# Patient Record
Sex: Female | Born: 1975 | Race: Black or African American | Hispanic: No | Marital: Single
Health system: Southern US, Community
[De-identification: ages and names within clinical notes are randomized; demographics above are authoritative.]

## PROBLEM LIST (undated history)

## (undated) ENCOUNTER — Inpatient Hospital Stay (HOSPITAL_COMMUNITY): Payer: Self-pay

## (undated) DIAGNOSIS — J189 Pneumonia, unspecified organism: Secondary | ICD-10-CM

## (undated) DIAGNOSIS — T7840XA Allergy, unspecified, initial encounter: Secondary | ICD-10-CM

## (undated) DIAGNOSIS — R51 Headache: Secondary | ICD-10-CM

## (undated) DIAGNOSIS — E049 Nontoxic goiter, unspecified: Secondary | ICD-10-CM

## (undated) DIAGNOSIS — R519 Headache, unspecified: Secondary | ICD-10-CM

## (undated) DIAGNOSIS — R87629 Unspecified abnormal cytological findings in specimens from vagina: Secondary | ICD-10-CM

## (undated) DIAGNOSIS — E739 Lactose intolerance, unspecified: Secondary | ICD-10-CM

## (undated) DIAGNOSIS — O09291 Supervision of pregnancy with other poor reproductive or obstetric history, first trimester: Secondary | ICD-10-CM

## (undated) DIAGNOSIS — G43909 Migraine, unspecified, not intractable, without status migrainosus: Secondary | ICD-10-CM

## (undated) HISTORY — DX: Migraine, unspecified, not intractable, without status migrainosus: G43.909

## (undated) HISTORY — DX: Lactose intolerance, unspecified: E73.9

## (undated) HISTORY — DX: Allergy, unspecified, initial encounter: T78.40XA

## (undated) HISTORY — PX: FOOT SURGERY: SHX648

## (undated) HISTORY — PX: DILATION AND CURETTAGE OF UTERUS: SHX78

---

## 1997-05-10 ENCOUNTER — Emergency Department (HOSPITAL_COMMUNITY): Admission: EM | Admit: 1997-05-10 | Discharge: 1997-05-10 | Payer: Self-pay | Admitting: Emergency Medicine

## 1997-05-16 ENCOUNTER — Emergency Department (HOSPITAL_COMMUNITY): Admission: EM | Admit: 1997-05-16 | Discharge: 1997-05-16 | Payer: Self-pay | Admitting: Emergency Medicine

## 1997-07-06 ENCOUNTER — Emergency Department (HOSPITAL_COMMUNITY): Admission: EM | Admit: 1997-07-06 | Discharge: 1997-07-06 | Payer: Self-pay | Admitting: Emergency Medicine

## 1997-09-15 ENCOUNTER — Other Ambulatory Visit: Admission: RE | Admit: 1997-09-15 | Discharge: 1997-09-15 | Payer: Self-pay | Admitting: Obstetrics

## 1997-09-28 ENCOUNTER — Emergency Department (HOSPITAL_COMMUNITY): Admission: EM | Admit: 1997-09-28 | Discharge: 1997-09-28 | Payer: Self-pay | Admitting: Emergency Medicine

## 1997-12-01 ENCOUNTER — Other Ambulatory Visit: Admission: RE | Admit: 1997-12-01 | Discharge: 1997-12-01 | Payer: Self-pay | Admitting: Obstetrics

## 1998-01-25 ENCOUNTER — Emergency Department (HOSPITAL_COMMUNITY): Admission: EM | Admit: 1998-01-25 | Discharge: 1998-01-25 | Payer: Self-pay | Admitting: Emergency Medicine

## 1998-01-28 ENCOUNTER — Ambulatory Visit (HOSPITAL_COMMUNITY): Admission: RE | Admit: 1998-01-28 | Discharge: 1998-01-28 | Payer: Self-pay | Admitting: *Deleted

## 1998-01-28 ENCOUNTER — Encounter: Payer: Self-pay | Admitting: *Deleted

## 1998-07-17 ENCOUNTER — Emergency Department (HOSPITAL_COMMUNITY): Admission: EM | Admit: 1998-07-17 | Discharge: 1998-07-17 | Payer: Self-pay | Admitting: Emergency Medicine

## 1999-02-26 ENCOUNTER — Inpatient Hospital Stay (HOSPITAL_COMMUNITY): Admission: AD | Admit: 1999-02-26 | Discharge: 1999-02-26 | Payer: Self-pay | Admitting: Obstetrics

## 1999-03-08 ENCOUNTER — Emergency Department (HOSPITAL_COMMUNITY): Admission: EM | Admit: 1999-03-08 | Discharge: 1999-03-08 | Payer: Self-pay | Admitting: Emergency Medicine

## 1999-08-07 ENCOUNTER — Emergency Department (HOSPITAL_COMMUNITY): Admission: EM | Admit: 1999-08-07 | Discharge: 1999-08-07 | Payer: Self-pay | Admitting: Emergency Medicine

## 1999-11-17 ENCOUNTER — Emergency Department (HOSPITAL_COMMUNITY): Admission: EM | Admit: 1999-11-17 | Discharge: 1999-11-17 | Payer: Self-pay | Admitting: Emergency Medicine

## 1999-11-17 ENCOUNTER — Encounter: Payer: Self-pay | Admitting: Emergency Medicine

## 2000-03-03 ENCOUNTER — Emergency Department (HOSPITAL_COMMUNITY): Admission: EM | Admit: 2000-03-03 | Discharge: 2000-03-03 | Payer: Self-pay | Admitting: Emergency Medicine

## 2000-03-31 ENCOUNTER — Emergency Department (HOSPITAL_COMMUNITY): Admission: EM | Admit: 2000-03-31 | Discharge: 2000-03-31 | Payer: Self-pay | Admitting: Emergency Medicine

## 2000-04-11 ENCOUNTER — Encounter: Admission: RE | Admit: 2000-04-11 | Discharge: 2000-04-11 | Payer: Self-pay | Admitting: Hematology and Oncology

## 2000-05-23 ENCOUNTER — Encounter: Admission: RE | Admit: 2000-05-23 | Discharge: 2000-05-23 | Payer: Self-pay | Admitting: Obstetrics & Gynecology

## 2000-06-13 ENCOUNTER — Encounter: Payer: Self-pay | Admitting: Emergency Medicine

## 2000-06-13 ENCOUNTER — Emergency Department (HOSPITAL_COMMUNITY): Admission: EM | Admit: 2000-06-13 | Discharge: 2000-06-13 | Payer: Self-pay

## 2000-07-25 ENCOUNTER — Encounter: Admission: RE | Admit: 2000-07-25 | Discharge: 2000-07-25 | Payer: Self-pay | Admitting: Obstetrics & Gynecology

## 2000-09-24 ENCOUNTER — Emergency Department (HOSPITAL_COMMUNITY): Admission: EM | Admit: 2000-09-24 | Discharge: 2000-09-24 | Payer: Self-pay | Admitting: Emergency Medicine

## 2000-09-28 ENCOUNTER — Encounter: Admission: RE | Admit: 2000-09-28 | Discharge: 2000-09-28 | Payer: Self-pay | Admitting: Obstetrics

## 2001-03-22 ENCOUNTER — Encounter: Admission: RE | Admit: 2001-03-22 | Discharge: 2001-03-22 | Payer: Self-pay | Admitting: *Deleted

## 2001-05-10 ENCOUNTER — Encounter: Admission: RE | Admit: 2001-05-10 | Discharge: 2001-05-10 | Payer: Self-pay | Admitting: *Deleted

## 2001-06-22 ENCOUNTER — Encounter: Admission: RE | Admit: 2001-06-22 | Discharge: 2001-06-22 | Payer: Self-pay | Admitting: *Deleted

## 2001-08-02 ENCOUNTER — Encounter: Admission: RE | Admit: 2001-08-02 | Discharge: 2001-08-02 | Payer: Self-pay | Admitting: *Deleted

## 2001-10-04 ENCOUNTER — Encounter: Admission: RE | Admit: 2001-10-04 | Discharge: 2001-10-04 | Payer: Self-pay | Admitting: Obstetrics and Gynecology

## 2001-11-02 ENCOUNTER — Encounter: Admission: RE | Admit: 2001-11-02 | Discharge: 2001-11-02 | Payer: Self-pay | Admitting: *Deleted

## 2001-11-13 ENCOUNTER — Encounter: Admission: RE | Admit: 2001-11-13 | Discharge: 2001-11-13 | Payer: Self-pay | Admitting: *Deleted

## 2002-01-10 ENCOUNTER — Encounter: Admission: RE | Admit: 2002-01-10 | Discharge: 2002-01-10 | Payer: Self-pay | Admitting: *Deleted

## 2002-05-30 ENCOUNTER — Encounter: Admission: RE | Admit: 2002-05-30 | Discharge: 2002-05-30 | Payer: Self-pay | Admitting: Obstetrics and Gynecology

## 2002-08-19 ENCOUNTER — Inpatient Hospital Stay (HOSPITAL_COMMUNITY): Admission: AD | Admit: 2002-08-19 | Discharge: 2002-08-19 | Payer: Self-pay | Admitting: Obstetrics and Gynecology

## 2002-09-18 ENCOUNTER — Inpatient Hospital Stay (HOSPITAL_COMMUNITY): Admission: AD | Admit: 2002-09-18 | Discharge: 2002-09-18 | Payer: Self-pay | Admitting: Obstetrics and Gynecology

## 2002-11-14 ENCOUNTER — Inpatient Hospital Stay (HOSPITAL_COMMUNITY): Admission: AD | Admit: 2002-11-14 | Discharge: 2002-11-14 | Payer: Self-pay | Admitting: Obstetrics & Gynecology

## 2003-06-27 ENCOUNTER — Emergency Department (HOSPITAL_COMMUNITY): Admission: EM | Admit: 2003-06-27 | Discharge: 2003-06-28 | Payer: Self-pay | Admitting: Emergency Medicine

## 2003-09-21 ENCOUNTER — Emergency Department (HOSPITAL_COMMUNITY): Admission: EM | Admit: 2003-09-21 | Discharge: 2003-09-21 | Payer: Self-pay | Admitting: Internal Medicine

## 2003-10-06 ENCOUNTER — Inpatient Hospital Stay (HOSPITAL_COMMUNITY): Admission: AD | Admit: 2003-10-06 | Discharge: 2003-10-06 | Payer: Self-pay | Admitting: *Deleted

## 2003-11-27 ENCOUNTER — Emergency Department (HOSPITAL_COMMUNITY): Admission: EM | Admit: 2003-11-27 | Discharge: 2003-11-27 | Payer: Self-pay | Admitting: Family Medicine

## 2004-02-25 ENCOUNTER — Inpatient Hospital Stay (HOSPITAL_COMMUNITY): Admission: AD | Admit: 2004-02-25 | Discharge: 2004-02-25 | Payer: Self-pay | Admitting: *Deleted

## 2004-02-27 ENCOUNTER — Ambulatory Visit: Payer: Self-pay | Admitting: Obstetrics and Gynecology

## 2004-02-27 ENCOUNTER — Ambulatory Visit (HOSPITAL_COMMUNITY): Admission: RE | Admit: 2004-02-27 | Discharge: 2004-02-27 | Payer: Self-pay | Admitting: Obstetrics and Gynecology

## 2004-02-27 ENCOUNTER — Encounter (INDEPENDENT_AMBULATORY_CARE_PROVIDER_SITE_OTHER): Payer: Self-pay | Admitting: Specialist

## 2004-03-11 ENCOUNTER — Ambulatory Visit: Payer: Self-pay | Admitting: Obstetrics and Gynecology

## 2004-04-22 ENCOUNTER — Ambulatory Visit: Payer: Self-pay | Admitting: Internal Medicine

## 2004-04-30 ENCOUNTER — Ambulatory Visit: Payer: Self-pay | Admitting: Internal Medicine

## 2004-05-11 ENCOUNTER — Emergency Department (HOSPITAL_COMMUNITY): Admission: EM | Admit: 2004-05-11 | Discharge: 2004-05-11 | Payer: Self-pay | Admitting: Emergency Medicine

## 2004-05-12 ENCOUNTER — Ambulatory Visit (HOSPITAL_COMMUNITY): Admission: RE | Admit: 2004-05-12 | Discharge: 2004-05-12 | Payer: Self-pay | Admitting: Internal Medicine

## 2004-05-12 ENCOUNTER — Emergency Department (HOSPITAL_COMMUNITY): Admission: EM | Admit: 2004-05-12 | Discharge: 2004-05-12 | Payer: Self-pay | Admitting: Emergency Medicine

## 2004-05-22 ENCOUNTER — Emergency Department (HOSPITAL_COMMUNITY): Admission: AD | Admit: 2004-05-22 | Discharge: 2004-05-22 | Payer: Self-pay | Admitting: Family Medicine

## 2004-05-25 ENCOUNTER — Ambulatory Visit: Payer: Self-pay | Admitting: Internal Medicine

## 2004-06-03 ENCOUNTER — Inpatient Hospital Stay (HOSPITAL_COMMUNITY): Admission: AD | Admit: 2004-06-03 | Discharge: 2004-06-03 | Payer: Self-pay | Admitting: Obstetrics & Gynecology

## 2004-06-10 ENCOUNTER — Ambulatory Visit: Payer: Self-pay | Admitting: Endocrinology

## 2004-06-22 ENCOUNTER — Other Ambulatory Visit: Admission: RE | Admit: 2004-06-22 | Discharge: 2004-06-22 | Payer: Self-pay | Admitting: Interventional Radiology

## 2004-06-22 ENCOUNTER — Encounter: Admission: RE | Admit: 2004-06-22 | Discharge: 2004-06-22 | Payer: Self-pay | Admitting: Endocrinology

## 2004-06-22 ENCOUNTER — Encounter (INDEPENDENT_AMBULATORY_CARE_PROVIDER_SITE_OTHER): Payer: Self-pay | Admitting: *Deleted

## 2004-06-29 ENCOUNTER — Inpatient Hospital Stay (HOSPITAL_COMMUNITY): Admission: AD | Admit: 2004-06-29 | Discharge: 2004-06-29 | Payer: Self-pay | Admitting: Obstetrics and Gynecology

## 2004-06-29 ENCOUNTER — Emergency Department (HOSPITAL_COMMUNITY): Admission: EM | Admit: 2004-06-29 | Discharge: 2004-06-29 | Payer: Self-pay | Admitting: Family Medicine

## 2004-07-02 ENCOUNTER — Inpatient Hospital Stay (HOSPITAL_COMMUNITY): Admission: AD | Admit: 2004-07-02 | Discharge: 2004-07-02 | Payer: Self-pay | Admitting: Obstetrics & Gynecology

## 2004-07-09 ENCOUNTER — Inpatient Hospital Stay (HOSPITAL_COMMUNITY): Admission: AD | Admit: 2004-07-09 | Discharge: 2004-07-09 | Payer: Self-pay | Admitting: Obstetrics and Gynecology

## 2004-07-21 ENCOUNTER — Inpatient Hospital Stay (HOSPITAL_COMMUNITY): Admission: AD | Admit: 2004-07-21 | Discharge: 2004-07-21 | Payer: Self-pay | Admitting: Obstetrics

## 2004-08-04 ENCOUNTER — Inpatient Hospital Stay (HOSPITAL_COMMUNITY): Admission: AD | Admit: 2004-08-04 | Discharge: 2004-08-04 | Payer: Self-pay | Admitting: Obstetrics

## 2004-08-05 ENCOUNTER — Emergency Department (HOSPITAL_COMMUNITY): Admission: EM | Admit: 2004-08-05 | Discharge: 2004-08-05 | Payer: Self-pay | Admitting: Emergency Medicine

## 2004-08-27 ENCOUNTER — Inpatient Hospital Stay (HOSPITAL_COMMUNITY): Admission: AD | Admit: 2004-08-27 | Discharge: 2004-08-27 | Payer: Self-pay | Admitting: Obstetrics

## 2004-08-28 ENCOUNTER — Encounter (INDEPENDENT_AMBULATORY_CARE_PROVIDER_SITE_OTHER): Payer: Self-pay | Admitting: Specialist

## 2004-08-28 ENCOUNTER — Ambulatory Visit (HOSPITAL_COMMUNITY): Admission: AD | Admit: 2004-08-28 | Discharge: 2004-08-28 | Payer: Self-pay | Admitting: Obstetrics

## 2004-09-29 ENCOUNTER — Emergency Department (HOSPITAL_COMMUNITY): Admission: EM | Admit: 2004-09-29 | Discharge: 2004-09-29 | Payer: Self-pay | Admitting: Emergency Medicine

## 2004-10-19 ENCOUNTER — Ambulatory Visit: Payer: Self-pay | Admitting: Endocrinology

## 2004-10-24 ENCOUNTER — Emergency Department (HOSPITAL_COMMUNITY): Admission: EM | Admit: 2004-10-24 | Discharge: 2004-10-24 | Payer: Self-pay | Admitting: Family Medicine

## 2004-10-30 ENCOUNTER — Emergency Department (HOSPITAL_COMMUNITY): Admission: EM | Admit: 2004-10-30 | Discharge: 2004-10-30 | Payer: Self-pay | Admitting: Family Medicine

## 2004-12-06 ENCOUNTER — Encounter (INDEPENDENT_AMBULATORY_CARE_PROVIDER_SITE_OTHER): Payer: Self-pay | Admitting: Specialist

## 2004-12-06 ENCOUNTER — Other Ambulatory Visit: Admission: RE | Admit: 2004-12-06 | Discharge: 2004-12-06 | Payer: Self-pay | Admitting: Interventional Radiology

## 2004-12-06 ENCOUNTER — Encounter: Admission: RE | Admit: 2004-12-06 | Discharge: 2004-12-06 | Payer: Self-pay | Admitting: Surgery

## 2005-01-09 ENCOUNTER — Emergency Department (HOSPITAL_COMMUNITY): Admission: AD | Admit: 2005-01-09 | Discharge: 2005-01-09 | Payer: Self-pay | Admitting: Family Medicine

## 2005-01-28 ENCOUNTER — Emergency Department (HOSPITAL_COMMUNITY): Admission: EM | Admit: 2005-01-28 | Discharge: 2005-01-28 | Payer: Self-pay | Admitting: Family Medicine

## 2005-03-23 ENCOUNTER — Emergency Department (HOSPITAL_COMMUNITY): Admission: EM | Admit: 2005-03-23 | Discharge: 2005-03-23 | Payer: Self-pay | Admitting: Family Medicine

## 2005-07-12 ENCOUNTER — Inpatient Hospital Stay (HOSPITAL_COMMUNITY): Admission: AD | Admit: 2005-07-12 | Discharge: 2005-07-12 | Payer: Self-pay | Admitting: Obstetrics and Gynecology

## 2005-08-07 ENCOUNTER — Emergency Department (HOSPITAL_COMMUNITY): Admission: EM | Admit: 2005-08-07 | Discharge: 2005-08-07 | Payer: Self-pay | Admitting: Emergency Medicine

## 2005-11-15 ENCOUNTER — Inpatient Hospital Stay (HOSPITAL_COMMUNITY): Admission: AD | Admit: 2005-11-15 | Discharge: 2005-11-15 | Payer: Self-pay | Admitting: Gynecology

## 2006-02-21 ENCOUNTER — Inpatient Hospital Stay (HOSPITAL_COMMUNITY): Admission: AD | Admit: 2006-02-21 | Discharge: 2006-02-24 | Payer: Self-pay | Admitting: Obstetrics

## 2006-02-21 ENCOUNTER — Ambulatory Visit: Payer: Self-pay | Admitting: Cardiovascular Disease

## 2006-02-21 ENCOUNTER — Ambulatory Visit: Payer: Self-pay | Admitting: Critical Care Medicine

## 2006-02-22 ENCOUNTER — Encounter: Payer: Self-pay | Admitting: Cardiovascular Disease

## 2006-06-16 ENCOUNTER — Inpatient Hospital Stay (HOSPITAL_COMMUNITY): Admission: AD | Admit: 2006-06-16 | Discharge: 2006-06-20 | Payer: Self-pay | Admitting: Obstetrics

## 2007-07-11 ENCOUNTER — Emergency Department (HOSPITAL_COMMUNITY): Admission: EM | Admit: 2007-07-11 | Discharge: 2007-07-11 | Payer: Self-pay | Admitting: Family Medicine

## 2007-11-07 ENCOUNTER — Emergency Department (HOSPITAL_COMMUNITY): Admission: EM | Admit: 2007-11-07 | Discharge: 2007-11-07 | Payer: Self-pay | Admitting: Emergency Medicine

## 2008-06-13 ENCOUNTER — Ambulatory Visit (HOSPITAL_COMMUNITY): Admission: RE | Admit: 2008-06-13 | Discharge: 2008-06-13 | Payer: Self-pay | Admitting: Obstetrics

## 2010-02-20 ENCOUNTER — Encounter: Payer: Self-pay | Admitting: Surgery

## 2010-05-10 ENCOUNTER — Ambulatory Visit (INDEPENDENT_AMBULATORY_CARE_PROVIDER_SITE_OTHER): Payer: Self-pay

## 2010-05-10 ENCOUNTER — Inpatient Hospital Stay (INDEPENDENT_AMBULATORY_CARE_PROVIDER_SITE_OTHER)
Admission: RE | Admit: 2010-05-10 | Discharge: 2010-05-10 | Disposition: A | Discharge status: Home / Self Care | Payer: Self-pay | Source: Ambulatory Visit | Attending: Emergency Medicine | Admitting: Emergency Medicine

## 2010-05-10 DIAGNOSIS — M779 Enthesopathy, unspecified: Secondary | ICD-10-CM

## 2010-05-10 LAB — DIFFERENTIAL
Eosinophils Absolute: 0.1 10*3/uL (ref 0.0–0.7)
Lymphs Abs: 2.4 10*3/uL (ref 0.7–4.0)
Neutrophils Relative %: 62 % (ref 43–77)

## 2010-05-10 LAB — CBC
MCV: 85.3 fL (ref 78.0–100.0)
Platelets: 238 10*3/uL (ref 150–400)
RBC: 5.03 MIL/uL (ref 3.87–5.11)
WBC: 8.9 10*3/uL (ref 4.0–10.5)

## 2010-06-15 NOTE — H&P (Signed)
Catherine Osborne, GUDGER NO.:  [id number]   MEDICAL RECORD NO.:  [id number]          PATIENT TYPE:  INP   LOCATION:  9162                          [facility]ACILITY:  [facility]   PHYSICIAN:  Roseanna Rainbow, M.D.DATE OF BIRTH:  [birth year]   DATE OF ADMISSION:  06/16/2006  DATE OF DISCHARGE:                              HISTORY & PHYSICAL   CHIEF COMPLAINT:  The patient is a 35 year old gravida 5, para 0 with an  estimated date of confinement of Jun 22, 2006 complaining of leaking of  fluid.   HISTORY OF PRESENT ILLNESS:  The patient reports leaking of fluid in the  previous 24 hours prior to presentation.  She reports one episode of  scant fluid passage.   OBSTETRICAL RISK FACTORS:  None.   PRENATAL SCREENS:  Hemoglobin 12, hematocrit 35.9, platelets 340,000.  Blood type B positive.  Antibody screen negative.  Sickle cell trait  negative.  RPR nonreactive.  Rubella immune.  Hepatitis B surface  antigen negative.  HIV nonreactive.  Declined the quad screen.  One-hour  GCT of 123.  GC probe negative.  Chlamydia probe negative.  GBS positive  on  May 22, 2006.  Ultrasound on January 26, 2006 at 19 weeks' 0 days'.  Anterior placenta.  [facility] Jun 22, 2006.   PAST OBSTETRICAL HISTORY:  She had a second-trimester voluntary  termination of pregnancy, 2 spontaneous abortions and first-trimester  voluntary termination of pregnancy.   PAST GYNECOLOGICAL HISTORY:  Noncontributory.   PAST MEDICAL HISTORY:  Childhood asthma, situational depression.   PAST SURGICAL HISTORY:  Two thyroid biopsies.   SOCIAL HISTORY:  No tobacco, ethanol or drug use.  She is single.   ALLERGIES:  To PENICILLIN.   MEDICATIONS:  None.   PHYSICAL EXAMINATION:  VITAL SIGNS:  Initial blood pressure 140/88.  The  remainder of her blood pressures have been 120s/70s.  Fetal heart  tracing reassuring. Tocodynamometer regular uterine contractions.  GENERAL:  Well developed, well nourished in no apparent  distress.  ABDOMEN:  Gravid.  VAGINAL:  Sterile vaginal exam per the RN service is 1 cm dilated, 50%  effaced.  The cervical mucus was noted, per the nurse practitioner, to  have coarse ferning.  Nitrazine was negative.  An ultrasound showed an  AFI of 6.4 with cephalic presentation.   ASSESSMENT:  1. Intrauterine pregnancy at 39+ weeks, questionable high leak.  2. Group B streptococcus positive.   PLAN:  1. Admission.  2. GBS prophylaxis.  3. Induction of labor with low-dose pitocin per protocol.      Roseanna Rainbow, M.D.  Electronically Signed     LAJ/MEDQ  D:  06/17/2006  T:  06/17/2006  Job:  [id number]

## 2010-06-18 NOTE — Discharge Summary (Signed)
NAMELAURAANN, MISSEY NO.:  [id number]   MEDICAL RECORD NO.:  [id number]          PATIENT TYPE:  INP   LOCATION:  9156                          [facility]ILITY:  [facility]   PHYSICIAN:  Kathreen Cosier, M.D.DATE OF BIRTH:  06-21-1975   DATE OF ADMISSION:  02/20/2006  DATE OF DISCHARGE:  02/24/2006                               DISCHARGE SUMMARY   HISTORY OF PRESENT ILLNESS:  The patient is a 35 year old gravida 5,  para 0-0-4-0, 22-weeks pregnant, starting having right sided chest pain  two days prior to admission.  No history of upper respiratory infection.  No sign of trauma.  Chest x-ray showed bilateral interstitial edema.  Showed negative CT scan, negative EKG, negative echo.  Seen by the  pulmonologist, and this was thought not to be a primary lung disease.  Hemoglobin was 10.6, white count 7.9.   The patient was discharged on January 25 to be followed up with  Pulmonary in two weeks.           ______________________________  Kathreen Cosier, M.D.     BAM/MEDQ  D:  03/08/2006  T:  03/08/2006  Job:  [id number]

## 2010-06-18 NOTE — Op Note (Signed)
NAMEZADAYA, CUADRA NO.:  [id number]   MEDICAL RECORD NO.:  [id number]          PATIENT TYPE:  MAT   LOCATION:  MATC                          [facility]  [facility]   PHYSICIAN:  Kathreen Cosier, M.D.DATE OF BIRTH:  1975/08/05   DATE OF PROCEDURE:  08/28/2004  DATE OF DISCHARGE:                                 OPERATIVE REPORT   PREOPERATIVE DIAGNOSIS:  Fetal demise at 9 weeks and threatened abortion.   Using __________ lithotomy position, perineum and vagina prepped and draped,  bladder emptied with straight catheter.  Bimanual exam revealed uterus 10  weeks size, retroverted.  Speculum placed in the vagina, cervix injected  with 9 mL of 1% Xylocaine at 3, 9, and 12 o'clock.  Anterior lip of the  cervix grasped with tenaculum.  The uterus was retroverted.  The cavity was  11 cm.  Cervix easily admitted a #27 Pratt.  Number 9 suction used to  aspirate the uterine contents until clean.  The patient tolerated the  procedure well, taken to recovery room in good condition.       BAM/MEDQ  D:  08/28/2004  T:  08/28/2004  Job:  [id number]

## 2010-06-18 NOTE — Op Note (Signed)
NAMELINZY, Catherine Osborne              ACCOUNT NO.:  [id number]   MEDICAL RECORD NO.:  [id number]          PATIENT TYPE:  AMB   LOCATION:  SDC                           [facility]  [facility]   PHYSICIAN:  Phil D. Okey Dupre, M.D.     DATE OF BIRTH:  February 05, 1975   DATE OF PROCEDURE:  02/27/2004  DATE OF DISCHARGE:                                 OPERATIVE REPORT   PREOPERATIVE DIAGNOSIS:  Missed abortion.   POSTOPERATIVE DIAGNOSIS:  Complete abortion.   PROCEDURE:  Dilatation and evacuation.   SURGEON:  Javier Glazier. Okey Dupre, M.D.   ANESTHESIA:  MAC plus local.   ESTIMATED BLOOD LOSS:  Minimal.   The procedure went as follows:  Under satisfactory MAC sedation with the  patient in the dorsal lithotomy position, the perineum and vagina were  prepped and draped in the usual sterile manner.  Bimanual pelvic examination  revealed the uterus about six weeks' gestational size, first degree  retroversion, freely movable with normal free adnexa.  A weighted speculum  was placed in the posterior fourchette of the vagina, anterior lip of the  cervix grasped with a single-tooth tenaculum, uterine cavity sounded to a  depth of 10 cm, the cervical os dilated to a #9 Hegar dilator.  A #8 curved  suction curette was used to evacuate the uterine contents without incident.  The tenaculum and speculum were removed from the vagina, the patient  transferred to the recovery room with minimal blood loss.      PDR/MEDQ  D:  02/27/2004  T:  02/27/2004  Job:  [id number]

## 2010-06-18 NOTE — Consult Note (Signed)
NAMEORIYA, KETTERING NO.:  [id number]   MEDICAL RECORD NO.:  [id number]          PATIENT TYPE:  INP   LOCATION:  9156                          [facility]ILITY[facility]  [facility]   PHYSICIAN:  Charlcie Cradle. Delford Field, MD, FCCPDATE OF BIRTH:  [birth year]   DATE OF CONSULTATION:  02/21/2006  DATE OF DISCHARGE:                                 CONSULTATION   CHIEF COMPLAINT:  Chest pain.   HISTORY OF PRESENT ILLNESS:  This is a 35 year old African American  female [redacted] weeks pregnant, this is her fifth pregnancy.  She has noted  right sided chest pain worse with deep breath, increased orthopnea,  increased pedal edema, increased dyspnea but not wheezing.  Blood  pressure has been good throughout the pregnancy.  She was admitted on  February 20, 2006, for same.  CT of the chest was negative for pulmonary  embolism but does show increasing interstitial edema.  There is no  previous known cardiac or cardiovascular history.   PAST MEDICAL HISTORY:  Essentially noncontributory.   MEDICATIONS PRIOR TO ADMISSION:  Multi-vitamins only.   ALLERGIES:  PENICILLIN.   Social history, family history, and review of systems, otherwise,  noncontributory.   PHYSICAL EXAMINATION:  VITAL SIGNS:  Blood pressure 127/69, pulse 82, respirations 20,  temperature 98.  CHEST:  Rales at the bases, no wheezes.  CARDIAC:  Regular rate and rhythm with a 3/6 holosystolic murmur.  EXTREMITIES:  3+ edema to the knees.  NEUROLOGICAL:  Awake and alert, moves all fours.  HEENT:  There is jugular venous distention noted to the angle of the  jaw.   EKG shows normal sinus rhythm, no ST-T wave changes.  CT of the chest  shows interstitial edema, cardiomegaly.  Hemoglobin 11.6, white count  11.6.  There is not a CMP on the chart.  Chest x-ray shows mild  interstitial edema.   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  Blood pressure is good.   RECOMMENDATIONS:  Check echocardiogram, BNP, ACE level, and give one  dose diuretic.      Charlcie Cradle Delford Field, MD, [facility]  Electronically Signed    PEW/MEDQ  D:  02/21/2006  T:  02/21/2006  Job:  [id number]

## 2011-03-17 ENCOUNTER — Emergency Department (HOSPITAL_COMMUNITY)
Admission: EM | Admit: 2011-03-17 | Discharge: 2011-03-17 | Disposition: A | Discharge status: Home / Self Care | Payer: Medicaid Other | Source: Home / Self Care

## 2011-03-17 ENCOUNTER — Other Ambulatory Visit: Payer: Self-pay

## 2011-03-17 ENCOUNTER — Encounter (HOSPITAL_COMMUNITY): Payer: Self-pay | Admitting: Emergency Medicine

## 2011-03-17 DIAGNOSIS — R071 Chest pain on breathing: Secondary | ICD-10-CM

## 2011-03-17 DIAGNOSIS — R0789 Other chest pain: Secondary | ICD-10-CM

## 2011-03-17 MED ORDER — IBUPROFEN 800 MG PO TABS: 800.0000 mg | ORAL_TABLET | Freq: Three times a day (TID) | ORAL | Status: AC

## 2011-03-17 NOTE — ED Notes (Signed)
PT HERE WITH SUDDEN LEFT CHEST WALL PAIN RADIATING TO STERNUM X 2 DYS AGO.PAIN WITH DEEP BREATHING AND SORE TO TOUCH.DENIES SOB.NO RADIATING PAIN NOTED BUT C/O TINGLING AT TIMES TO FLANK AREA?

## 2011-03-17 NOTE — ED Provider Notes (Signed)
History     CSN: [id number]  Arrival date & time 03/17/11  1156   None     Chief Complaint  Patient presents with  . Pleurisy    (Consider location/radiation/quality/duration/timing/severity/associated sxs/prior treatment) HPI Comments: Onset of Lt chest pain 2 days ago. Pain is worse with deep inspiration and to touch. She denies injury or recent cough but does lift her 35 yo son frequently and states he is big for his age. No dyspnea, or indigestion. The pain is sharp and intermittent. No radiation. She has not tried anything for her symptoms. She is a smoker. No known FH of heart disease - she is adopted.    Past Medical History  Diagnosis Date  . Asthma     Past Surgical History  Procedure Date  . Dnc     History reviewed. No pertinent family history.  History  Substance Use Topics  . Smoking status: Current Everyday Smoker  . Smokeless tobacco: Not on file  . Alcohol Use: Yes    OB History    Grav Para Term Preterm Abortions TAB SAB Ect Mult Living                  Review of Systems  Constitutional: Negative for fever, chills and fatigue.  HENT: Negative for ear pain, sore throat and rhinorrhea.   Respiratory: Negative for cough, shortness of breath and wheezing.   Cardiovascular: Positive for chest pain. Negative for palpitations.    Allergies  Penicillins  Home Medications   Current Outpatient Rx  Name Route Sig Dispense Refill  . ONE-DAILY MULTI VITAMINS PO TABS Oral Take 1 tablet by mouth daily.    . IBUPROFEN 800 MG PO TABS Oral Take 1 tablet (800 mg total) by mouth 3 (three) times daily. 15 tablet 0    BP 122/70  Pulse 82  Temp(Src) 98.9 F (37.2 C) (Oral)  Resp 16  SpO2 100%  Physical Exam  Constitutional: She appears well-developed and well-nourished. No distress.  HENT:  Head: Normocephalic and atraumatic.  Right Ear: Tympanic membrane, external ear and ear canal normal.  Left Ear: Tympanic membrane, external ear and ear canal  normal.  Nose: Nose normal.  Mouth/Throat: Uvula is midline, oropharynx is clear and moist and mucous membranes are normal. No oropharyngeal exudate, posterior oropharyngeal edema or posterior oropharyngeal erythema.  Neck: Neck supple.  Cardiovascular: Normal rate, regular rhythm and normal heart sounds.   Pulmonary/Chest: Effort normal and breath sounds normal. No respiratory distress. She exhibits tenderness (TTP Lt 4th and 5th ribs.).  Lymphadenopathy:    She has no cervical adenopathy.  Neurological: She is alert.  Skin: Skin is warm and dry.  Psychiatric: She has a normal mood and affect.    ED Course  Procedures (including critical care time)  Labs Reviewed - No data to display No results found.   1. Chest wall pain       MDM  EKG, rate 69, sinus rhythm with short PR.   Pt advised that she has chest wall pain. To f/u with PCP regarding EKG changes. Short PR interval with discussed with pt and explained with diagram.         Melody Comas, PA 03/17/11 1317

## 2011-03-17 NOTE — Discharge Instructions (Signed)
Take Ibuprofen as prescribed. Ice packs to chest also as needed for discomfort. As discussed your EKG shows a minor change in the electrical conduction. This is not causing you any symptoms or immediate harm. I do think that you should follow up with Dr Gaynell Face regarding this in the next 1-2 weeks.

## 2011-03-19 NOTE — ED Provider Notes (Signed)
Medical screening examination/treatment/procedure(s) were performed by non-physician practitioner and as supervising physician I was immediately available for consultation/collaboration.  LANEY,RONNIE   Ronnie Laney, MD 03/19/11 0914 

## 2011-04-27 ENCOUNTER — Emergency Department (INDEPENDENT_AMBULATORY_CARE_PROVIDER_SITE_OTHER)
Admission: EM | Admit: 2011-04-27 | Discharge: 2011-04-27 | Disposition: A | Discharge status: Home / Self Care | Payer: Medicaid Other | Source: Home / Self Care | Attending: Family Medicine | Admitting: Family Medicine

## 2011-04-27 ENCOUNTER — Encounter (HOSPITAL_COMMUNITY): Payer: Self-pay | Admitting: *Deleted

## 2011-04-27 ENCOUNTER — Emergency Department (INDEPENDENT_AMBULATORY_CARE_PROVIDER_SITE_OTHER): Payer: Medicaid Other

## 2011-04-27 DIAGNOSIS — J189 Pneumonia, unspecified organism: Secondary | ICD-10-CM

## 2011-04-27 LAB — POCT PREGNANCY, URINE: Preg Test, Ur: NEGATIVE

## 2011-04-27 LAB — POCT URINALYSIS DIP (DEVICE)
Glucose, UA: NEGATIVE mg/dL
Nitrite: NEGATIVE
Urobilinogen, UA: 2 mg/dL — ABNORMAL HIGH (ref 0.0–1.0)

## 2011-04-27 MED ORDER — LIDOCAINE HCL (PF) 1 % IJ SOLN
INTRAMUSCULAR | Status: AC
  Filled 2011-04-27: qty 5

## 2011-04-27 MED ORDER — AZITHROMYCIN 250 MG PO TABS: 250.0000 mg | ORAL_TABLET | Freq: Every day | ORAL | Status: AC

## 2011-04-27 MED ORDER — ONDANSETRON HCL 4 MG PO TABS: 4.0000 mg | ORAL_TABLET | Freq: Three times a day (TID) | ORAL | Status: AC | PRN

## 2011-04-27 MED ORDER — CEFTRIAXONE SODIUM 1 G IJ SOLR
1.0000 g | INTRAMUSCULAR | Status: DC
  Administered 2011-04-27: 1 g via INTRAMUSCULAR

## 2011-04-27 MED ORDER — CEFTRIAXONE SODIUM 1 G IJ SOLR
INTRAMUSCULAR | Status: AC
  Filled 2011-04-27: qty 10

## 2011-04-27 MED ORDER — GUAIFENESIN 300 MG/15ML PO SOLN: 15.0000 mL | Freq: Three times a day (TID) | ORAL | Status: DC

## 2011-04-27 MED ORDER — HYDROCODONE-ACETAMINOPHEN 7.5-500 MG/15ML PO SOLN: 15.0000 mL | Freq: Three times a day (TID) | ORAL | Status: AC | PRN

## 2011-04-27 MED ORDER — ALBUTEROL SULFATE HFA 108 (90 BASE) MCG/ACT IN AERS
1.0000 | INHALATION_SPRAY | Freq: Four times a day (QID) | RESPIRATORY_TRACT | Status: DC | PRN
Start: 2011-04-27 — End: 2011-09-07

## 2011-04-27 MED ORDER — ACETAMINOPHEN 325 MG PO TABS
ORAL_TABLET | ORAL | Status: AC
  Filled 2011-04-27: qty 2

## 2011-04-27 MED ORDER — ACETAMINOPHEN 325 MG PO TABS
650.0000 mg | ORAL_TABLET | Freq: Once | ORAL | Status: AC
  Administered 2011-04-27: 650 mg via ORAL

## 2011-04-27 MED ORDER — ONDANSETRON 4 MG PO TBDP
ORAL_TABLET | ORAL | Status: AC
  Filled 2011-04-27: qty 2

## 2011-04-27 MED ORDER — ONDANSETRON 4 MG PO TBDP
8.0000 mg | ORAL_TABLET | Freq: Once | ORAL | Status: AC
Start: 2011-04-27 — End: 2011-04-27
  Administered 2011-04-27: 8 mg via ORAL

## 2011-04-27 NOTE — Discharge Instructions (Signed)
Ginny to quit smoking! You do have infection in your left lung called pneumonia. Keep well-hydrated drinking plenty of fluids even if you don't feel like eating solids. Can take over-the-counter ibuprofen every 8 hours in combination with the syrup were prescribed as needed for pain or fever. Be aware that hydrocodone can make you drowsy and he should not drive after taking. Take the prescribed medications as instructed. Return here or followup with your primary doctor in 48-hours for recheck. Return earlier if worsening symptoms like difficulty breathing or not keeping fluids down despite following treatment.

## 2011-04-27 NOTE — ED Notes (Signed)
Pt with onset of cough/congestion/aching/chills/headache x 5 days - taking otc meds without relief

## 2011-04-29 ENCOUNTER — Emergency Department (INDEPENDENT_AMBULATORY_CARE_PROVIDER_SITE_OTHER)
Admission: EM | Admit: 2011-04-29 | Discharge: 2011-04-29 | Disposition: A | Discharge status: Home / Self Care | Payer: Medicaid Other | Source: Home / Self Care | Attending: Family Medicine | Admitting: Family Medicine

## 2011-04-29 ENCOUNTER — Encounter (HOSPITAL_COMMUNITY): Payer: Self-pay

## 2011-04-29 DIAGNOSIS — J189 Pneumonia, unspecified organism: Secondary | ICD-10-CM

## 2011-04-29 HISTORY — DX: Pneumonia, unspecified organism: J18.9

## 2011-04-29 MED ORDER — NYSTATIN 100000 UNIT/ML MT SUSP: 500000.0000 [IU] | Freq: Four times a day (QID) | OROMUCOSAL | Status: AC

## 2011-04-29 NOTE — ED Provider Notes (Signed)
History     CSN: [id number]  Arrival date & time 04/29/11  1325   First MD Initiated Contact with Patient 04/29/11 1533      Chief Complaint  Patient presents with  . Pneumonia    (Consider location/radiation/quality/duration/timing/severity/associated sxs/prior treatment) HPI Comments: The patient is here for follow up pneumonia. She is now afibrile. Still feels tired and has a congested cough. She is taking her medications. Old records and chest xray reviewed.   The history is provided by the patient.    Past Medical History  Diagnosis Date  . Asthma   . Pneumonia     Past Surgical History  Procedure Date  . Dnc     History reviewed. No pertinent family history.  History  Substance Use Topics  . Smoking status: Current Everyday Smoker  . Smokeless tobacco: Not on file  . Alcohol Use: Yes    OB History    Grav Para Term Preterm Abortions TAB SAB Ect Mult Living                  Review of Systems  Constitutional: Positive for chills, diaphoresis and fatigue.  HENT: Negative.   Respiratory: Positive for cough.   Cardiovascular: Negative.   Genitourinary: Negative.   Musculoskeletal: Negative.   Skin: Negative.     Allergies  Penicillins  Home Medications   Current Outpatient Rx  Name Route Sig Dispense Refill  . ALBUTEROL SULFATE HFA 108 (90 BASE) MCG/ACT IN AERS Inhalation Inhale 1-2 puffs into the lungs every 6 (six) hours as needed for wheezing. 1 Inhaler 0  . AZITHROMYCIN 250 MG PO TABS Oral Take 1 tablet (250 mg total) by mouth daily. Take first 2 tablets together, then 1 every day until finished. 6 tablet 0  . GUAIFENESIN 300 MG/15ML PO SOLN Oral Take 15 mLs by mouth 3 (three) times daily. 120 mL 0  . HYDROCODONE-ACETAMINOPHEN 7.5-500 MG/15ML PO SOLN Oral Take 15 mLs by mouth every 8 (eight) hours as needed for pain or cough. 120 mL 0  . ONE-DAILY MULTI VITAMINS PO TABS Oral Take 1 tablet by mouth daily.    Marland Kitchen ONDANSETRON HCL 4 MG PO TABS Oral  Take 1 tablet (4 mg total) by mouth every 8 (eight) hours as needed for nausea. 10 tablet 0  . PSEUDOEPHEDRINE HCL 30 MG/5ML PO LIQD Oral Take 60 mg by mouth every 6 (six) hours.    . DAYQUIL PO Oral Take by mouth.      BP 123/86  Pulse 88  Temp(Src) 98.6 F (37 C) (Oral)  Resp 18  SpO2 98%  Physical Exam  Nursing note and vitals reviewed. Constitutional: She appears well-developed and well-nourished. No distress.  HENT:  Head: Normocephalic and atraumatic.  Neck: Normal range of motion. Neck supple.  Cardiovascular: Normal rate.   Pulmonary/Chest: Effort normal and breath sounds normal. She has no wheezes.  Abdominal: Soft. Bowel sounds are normal.  Lymphadenopathy:    She has no cervical adenopathy.    ED Course  Procedures (including critical care time)  Labs Reviewed - No data to display Dg Chest 2 View  04/27/2011  *RADIOLOGY REPORT*  Clinical Data: Cough and fever  CHEST - 2 VIEW  Comparison: 11/07/2007  Findings: Left lower lobe patchy infiltrate compatible with pneumonia.  Right lung is clear.  No pleural effusion.  Normal vascularity.  IMPRESSION: Left lower lobe pneumonia.  Original Report Authenticated By: Camelia Phenes, M.D.     1. Community acquired pneumonia  improving   MDM          Randa Spike, MD 04/29/11 [phone]

## 2011-04-29 NOTE — ED Notes (Signed)
Here for follow up on pneumonia; actively expectorating yellow secretions

## 2011-04-29 NOTE — Discharge Instructions (Signed)
Increase fluids. Avoid caffeine and milk products. Follow up in 5 days for recheck . Sooner if symtpoms worsen. Pneumonia, Adult Pneumonia is an infection of the lungs.  CAUSES Pneumonia may be caused by bacteria or a virus. Usually, these infections are caused by breathing infectious particles into the lungs (respiratory tract). SYMPTOMS   Cough.   Fever.   Chest pain.   Increased rate of breathing.   Wheezing.   Mucus production.  DIAGNOSIS  If you have the common symptoms of pneumonia, your caregiver will typically confirm the diagnosis with a chest X-ray. The X-ray will show an abnormality in the lung (pulmonary infiltrate) if you have pneumonia. Other tests of your blood, urine, or sputum may be done to find the specific cause of your pneumonia. Your caregiver may also do tests (blood gases or pulse oximetry) to see how well your lungs are working. TREATMENT  Some forms of pneumonia may be spread to other people when you cough or sneeze. You may be asked to wear a mask before and during your exam. Pneumonia that is caused by bacteria is treated with antibiotic medicine. Pneumonia that is caused by the influenza virus may be treated with an antiviral medicine. Most other viral infections must run their course. These infections will not respond to antibiotics.  PREVENTION A pneumococcal shot (vaccine) is available to prevent a common bacterial cause of pneumonia. This is usually suggested for:  People over 66 years old.   Patients on chemotherapy.   People with chronic lung problems, such as bronchitis or emphysema.   People with immune system problems.  If you are over 65 or have a high risk condition, you may receive the pneumococcal vaccine if you have not received it before. In some countries, a routine influenza vaccine is also recommended. This vaccine can help prevent some cases of pneumonia.You may be offered the influenza vaccine as part of your care. If you smoke, it is  time to quit. You may receive instructions on how to stop smoking. Your caregiver can provide medicines and counseling to help you quit. HOME CARE INSTRUCTIONS   Cough suppressants may be used if you are losing too much rest. However, coughing protects you by clearing your lungs. You should avoid using cough suppressants if you can.   Your caregiver may have prescribed medicine if he or she thinks your pneumonia is caused by a bacteria or influenza. Finish your medicine even if you start to feel better.   Your caregiver may also prescribe an expectorant. This loosens the mucus to be coughed up.   Only take over-the-counter or prescription medicines for pain, discomfort, or fever as directed by your caregiver.   Do not smoke. Smoking is a common cause of bronchitis and can contribute to pneumonia. If you are a smoker and continue to smoke, your cough may last several weeks after your pneumonia has cleared.   A cold steam vaporizer or humidifier in your room or home may help loosen mucus.   Coughing is often worse at night. Sleeping in a semi-upright position in a recliner or using a couple pillows under your head will help with this.   Get rest as you feel it is needed. Your body will usually let you know when you need to rest.  SEEK IMMEDIATE MEDICAL CARE IF:   Your illness becomes worse. This is especially true if you are elderly or weakened from any other disease.   You cannot control your cough with suppressants and are  losing sleep.   You begin coughing up blood.   You develop pain which is getting worse or is uncontrolled with medicines.   You have a fever.   Any of the symptoms which initially brought you in for treatment are getting worse rather than better.   You develop shortness of breath or chest pain.  MAKE SURE YOU:   Understand these instructions.   Will watch your condition.   Will get help right away if you are not doing well or get worse.  Document Released:  01/17/2005 Document Revised: 01/06/2011 Document Reviewed: 04/08/2010 [facility] Patient Information 2012 [place], [place].

## 2011-05-01 NOTE — ED Provider Notes (Signed)
History     CSN: [id number]  Arrival date & time 04/27/11  1500   First MD Initiated Contact with Patient 04/27/11 1604      Chief Complaint  Patient presents with  . Chills  . Generalized Body Aches  . Nasal Congestion  . Headache  . Cough    (Consider location/radiation/quality/duration/timing/severity/associated sxs/prior treatment) HPI Comments: 36 y/o smoker female here c/o fever, chills, headache, productive cough and congestion  for 5 days. Taking dayquil and Nyquil with no significant relief. Reports anterior chest pain with cough. Green sputum with cough. Pt. Allergic to penicillin. No other family members with similar symptoms. reports nausea but no vomiting or diarrhea.   Past Medical History  Diagnosis Date  . Asthma   . Pneumonia     Past Surgical History  Procedure Date  . Dnc     History reviewed. No pertinent family history.  History  Substance Use Topics  . Smoking status: Current Everyday Smoker  . Smokeless tobacco: Not on file  . Alcohol Use: Yes    OB History    Grav Para Term Preterm Abortions TAB SAB Ect Mult Living                  Review of Systems  Constitutional: Positive for fever, chills and appetite change.  HENT: Positive for congestion and rhinorrhea. Negative for ear pain, sore throat, facial swelling, trouble swallowing, neck pain and sinus pressure.   Respiratory: Positive for cough.   Cardiovascular: Positive for chest pain. Negative for palpitations and leg swelling.  Gastrointestinal: Positive for nausea. Negative for vomiting, diarrhea and abdominal distention.  Genitourinary: Negative for dysuria, frequency, hematuria and flank pain.  Musculoskeletal: Positive for myalgias and arthralgias.  Skin: Negative for rash.  Neurological: Positive for headaches.    Allergies  Penicillins  Home Medications     BP 116/75  Pulse 112  Temp(Src) 101.5 F (38.6 C) (Oral)  Resp 22  SpO2 96%  Physical Exam  Nursing note  and vitals reviewed. Constitutional: She is oriented to person, place, and time. She appears well-developed and well-nourished.       i"ll loking febrile.  HENT:  Head: Normocephalic and atraumatic.       Nasal Congestion with erythema and swelling of nasal turbinates, clear rhinorrhea. pharyngeal erythema no exudates. No uvula deviation. No trismus. TM's normal.   Eyes: Conjunctivae are normal. Pupils are equal, round, and reactive to light. No scleral icterus.  Neck: Neck supple. No JVD present. No thyromegaly present.  Pulmonary/Chest: Effort normal. No respiratory distress. She has rales. She exhibits no tenderness.       Impress decreased BS rhonchi and fine rales at mid left lung. No active wheezing. Right lung exam is normal. No tachypnea, no orthopnea.   Abdominal: Soft. Bowel sounds are normal. She exhibits no distension. There is no tenderness.  Lymphadenopathy:    She has no cervical adenopathy.  Neurological: She is alert and oriented to person, place, and time.  Skin: No rash noted.    ED Course  Procedures (including critical care time)  Labs Reviewed  POCT URINALYSIS DIP (DEVICE) - Abnormal; Notable for the following:    Bilirubin Urine SMALL (*)    Ketones, ur 40 (*)    Hgb urine dipstick SMALL (*)    Protein, ur 100 (*)    Urobilinogen, UA 2.0 (*)    All other components within normal limits  POCT PREGNANCY, URINE  LAB REPORT - SCANNED   No  results found.   1. LLL pneumonia       MDM  Smoker female with LLL pneumonia ( CAP). Reported allergy to [facility]. Given rocephin 1g IM x1 and ondansetron ODT on site with no side effects. Drinking fluids well and afebrile prior discharge.Treated with azithromycin, hydrocodone, ondansetron, albuterol and guaifenesin. Asked to return in 24-48 hours for recheck or go to the ED if worsening symptoms despite following treatment.         Sharin Grave, MD 05/02/11 0900

## 2011-07-19 ENCOUNTER — Emergency Department (HOSPITAL_COMMUNITY)
Admission: EM | Admit: 2011-07-19 | Discharge: 2011-07-19 | Disposition: A | Discharge status: Home / Self Care | Payer: Self-pay | Source: Home / Self Care

## 2011-08-29 ENCOUNTER — Other Ambulatory Visit: Payer: Self-pay | Admitting: Otolaryngology

## 2011-08-29 DIAGNOSIS — E041 Nontoxic single thyroid nodule: Secondary | ICD-10-CM

## 2011-09-06 ENCOUNTER — Ambulatory Visit
Admission: RE | Admit: 2011-09-06 | Discharge: 2011-09-06 | Disposition: A | Discharge status: Home / Self Care | Payer: Medicaid Other | Source: Ambulatory Visit | Attending: Otolaryngology | Admitting: Otolaryngology

## 2011-09-06 ENCOUNTER — Other Ambulatory Visit (HOSPITAL_COMMUNITY)
Admission: RE | Admit: 2011-09-06 | Discharge: 2011-09-06 | Disposition: A | Discharge status: Home / Self Care | Payer: Medicaid Other | Source: Ambulatory Visit | Attending: Interventional Radiology | Admitting: Interventional Radiology

## 2011-09-06 DIAGNOSIS — E041 Nontoxic single thyroid nodule: Secondary | ICD-10-CM

## 2011-09-06 DIAGNOSIS — E049 Nontoxic goiter, unspecified: Secondary | ICD-10-CM | POA: Insufficient documentation

## 2011-09-07 ENCOUNTER — Emergency Department (HOSPITAL_COMMUNITY)
Admission: EM | Admit: 2011-09-07 | Discharge: 2011-09-07 | Disposition: A | Discharge status: Home / Self Care | Payer: Medicaid Other | Attending: Emergency Medicine | Admitting: Emergency Medicine

## 2011-09-07 ENCOUNTER — Emergency Department (HOSPITAL_COMMUNITY): Payer: Medicaid Other

## 2011-09-07 ENCOUNTER — Encounter (HOSPITAL_COMMUNITY): Payer: Self-pay | Admitting: Emergency Medicine

## 2011-09-07 DIAGNOSIS — J40 Bronchitis, not specified as acute or chronic: Secondary | ICD-10-CM | POA: Insufficient documentation

## 2011-09-07 DIAGNOSIS — Z720 Tobacco use: Secondary | ICD-10-CM

## 2011-09-07 DIAGNOSIS — F17200 Nicotine dependence, unspecified, uncomplicated: Secondary | ICD-10-CM | POA: Insufficient documentation

## 2011-09-07 DIAGNOSIS — J45909 Unspecified asthma, uncomplicated: Secondary | ICD-10-CM | POA: Insufficient documentation

## 2011-09-07 MED ORDER — AZITHROMYCIN 250 MG PO TABS: 250.0000 mg | ORAL_TABLET | Freq: Every day | ORAL | Status: AC

## 2011-09-07 MED ORDER — ALBUTEROL SULFATE HFA 108 (90 BASE) MCG/ACT IN AERS
1.0000 | INHALATION_SPRAY | Freq: Four times a day (QID) | RESPIRATORY_TRACT | Status: DC | PRN
Start: 2011-09-07 — End: 2012-05-22

## 2011-09-07 NOTE — ED Provider Notes (Signed)
History     CSN: [id number]  Arrival date & time 09/07/11  4098   First MD Initiated Contact with Patient 09/07/11 1232      Chief Complaint  Patient presents with  . URI  . Shortness of Breath  . Cough    (Consider location/radiation/quality/duration/timing/severity/associated sxs/prior treatment) HPI Comments: Patient comes in today with a chief complaint of productive cough.  Cough has been present for the past 2-3 days and is gradually worsening.  She does have some chest pain, but only when she coughs really hard.  She is also complaining of mild shortness of breath that is present in the morning.  She denies fever or chills.  She currently smokes 1 pack cigarettes every other day.  She also smokes marijuana daily.  She denies any previous history of PE or DVT.  She is not on any estrogen containing medications.  No surgery or prolonged travel in the past 4 weeks.  No LE edema or pain.  Patient is a 36 y.o. female presenting with URI, shortness of breath, and cough. The history is provided by the patient.  URI The primary symptoms include cough. Primary symptoms do not include fever, wheezing, abdominal pain, nausea or vomiting.  Symptoms associated with the illness include congestion. The illness is not associated with chills.  Shortness of Breath  Associated symptoms include cough and shortness of breath. Pertinent negatives include no fever and no wheezing.  Cough Associated symptoms include shortness of breath. Pertinent negatives include no chills and no wheezing.    Past Medical History  Diagnosis Date  . Asthma   . Pneumonia     Past Surgical History  Procedure Date  . Dnc     History reviewed. No pertinent family history.  History  Substance Use Topics  . Smoking status: Current Everyday Smoker  . Smokeless tobacco: Not on file  . Alcohol Use: Yes    OB History    Grav Para Term Preterm Abortions TAB SAB Ect Mult Living                  Review of  Systems  Constitutional: Negative for fever and chills.  HENT: Positive for congestion.        Neck pain at site of thyroid biopsy  Respiratory: Positive for cough and shortness of breath. Negative for wheezing.   Cardiovascular: Negative for palpitations and leg swelling.       Chest pain with coughing  Gastrointestinal: Negative for nausea, vomiting and abdominal pain.  Neurological: Negative for dizziness, syncope and light-headedness.    Allergies  Review of patient's allergies indicates no known allergies.  Home Medications   Current Outpatient Rx  Name Route Sig Dispense Refill  . ALBUTEROL SULFATE HFA 108 (90 BASE) MCG/ACT IN AERS Inhalation Inhale 1-2 puffs into the lungs every 6 (six) hours as needed for wheezing. 1 Inhaler 0  . AZITHROMYCIN 250 MG PO TABS Oral Take 1 tablet (250 mg total) by mouth daily. Take first 2 tablets together, then 1 every day until finished. 6 tablet 0    BP 129/64  Pulse 74  Temp 98.7 F (37.1 C) (Oral)  Resp 16  SpO2 99%  Physical Exam  Nursing note and vitals reviewed. Constitutional: She appears well-developed and well-nourished. No distress.  HENT:  Head: Normocephalic and atraumatic.  Mouth/Throat: Oropharynx is clear and moist.  Neck: Normal range of motion. Neck supple.       No erythema or warmth of the neck.  Cardiovascular: Normal rate, regular rhythm, normal heart sounds and intact distal pulses.   Pulmonary/Chest: Effort normal. No accessory muscle usage. Not tachypneic. No respiratory distress. She has no decreased breath sounds. She has wheezes. She has no rales. She exhibits no tenderness.       Mild diffuse expiratory wheezing  Musculoskeletal: Normal range of motion.  Neurological: She is alert.  Skin: Skin is warm and dry. No rash noted. She is not diaphoretic.  Psychiatric: She has a normal mood and affect.    ED Course  Procedures (including critical care time)  Labs Reviewed - No data to display    1.  Bronchitis   2. Tobacco abuse       MDM  Patient presenting with productive cough for the past 2-3 days.  VSS.  Patient afebrile.  Oxygen sat 98 on RA.  No acute finding on CXR.  CXR showing changes consistent with Chronic Bronchitis.  Patient currently smokes.  Patient discharged home with Albuterol inhaler.  Return precautions discussed with patient.  Patient instructed to follow up with PCP.        Pascal Lux [place], PA-C 09/07/11 1953

## 2011-09-07 NOTE — ED Notes (Signed)
MD at bedside. 

## 2011-09-07 NOTE — ED Notes (Signed)
Pt c/o cough with SOB x 3 days; pt sts had biopsy of thyroid yesterday and having pain in neck

## 2011-09-07 NOTE — ED Notes (Signed)
Pt reports having a biopsy yesterday of her thyroid.

## 2011-09-07 NOTE — ED Notes (Signed)
Meal tray given 

## 2011-09-07 NOTE — ED Notes (Signed)
provider at bedside

## 2011-09-08 NOTE — ED Provider Notes (Signed)
Medical screening examination/treatment/procedure(s) were performed by non-physician practitioner and as supervising physician I was immediately available for consultation/collaboration.  Lael Pilch, MD 09/08/11 0821 

## 2011-10-25 ENCOUNTER — Encounter (INDEPENDENT_AMBULATORY_CARE_PROVIDER_SITE_OTHER): Payer: Self-pay | Admitting: General Surgery

## 2011-10-25 ENCOUNTER — Encounter (INDEPENDENT_AMBULATORY_CARE_PROVIDER_SITE_OTHER): Payer: Self-pay

## 2011-10-25 ENCOUNTER — Ambulatory Visit (INDEPENDENT_AMBULATORY_CARE_PROVIDER_SITE_OTHER): Payer: Medicaid Other | Admitting: General Surgery

## 2011-10-25 VITALS — BP 112/68 | HR 72 | Temp 97.9°F | Resp 14 | Ht 62.0 in | Wt 137.0 lb

## 2011-10-25 DIAGNOSIS — E041 Nontoxic single thyroid nodule: Secondary | ICD-10-CM

## 2011-10-25 NOTE — Progress Notes (Signed)
[name], female   DOB: Mar 27, 1975, 36 y.o.   MRN: [id number]  Chief complaint:  The patient comes in with a left thyroid nodule  HPI Catherine Osborne is a 36 y.o. female.  The patient has been referred to [place] because of a left thyroid nodule.the patient is known about this nodule 47 years. With her first pregnancy she was noted to have a mass however workup at that time was delayed because of the pregnancy. She has had previous needle biopsies which have demonstrated only benign tissue. However the patient is uncertain of the actual findings.  Recently she was noted to have a mass in April and subsequently underwent an ultrasound-guided fine-needle aspiration biopsy in August. This demonstrated findings consistent with nonmalignant nodule. Because of some symptoms related to the nodule and also concerns of possible malignancy by the patient she was referred to [place] for evaluation. HPI  Past Medical History  Diagnosis Date  . Asthma   . Pneumonia     Past Surgical History  Procedure Date  . Dnc     No family history on file.  Social History History  Substance Use Topics  . Smoking status: Current Every Day Smoker  . Smokeless tobacco: Not on file  . Alcohol Use: Yes    No Known Allergies  Current Outpatient Prescriptions  Medication Sig Dispense Refill  . albuterol (PROVENTIL HFA;VENTOLIN HFA) 108 (90 BASE) MCG/ACT inhaler Inhale 1-2 puffs into the lungs every 6 (six) hours as needed for wheezing.  1 Inhaler  0    Review of Systems Review of Systems  Constitutional: Negative.   HENT: Positive for neck pain (mild). Negative for ear pain and neck stiffness.   Eyes: Negative.   Respiratory: Positive for cough (recent allegies) and wheezing (smoker).   Cardiovascular: Negative.   Gastrointestinal: Negative.   Genitourinary: Negative.   Neurological: Negative.   Hematological: Negative.   Psychiatric/Behavioral: Negative.     There were no vitals taken  for this visit.  Physical Exam Physical Exam  Constitutional: She is oriented to person, place, and time. She appears well-developed and well-nourished.  HENT:  Head: Normocephalic and atraumatic.  Eyes: Conjunctivae normal and EOM are normal. Pupils are equal, round, and reactive to light.  Neck: Trachea normal and phonation normal. Normal carotid pulses present. No tracheal tenderness present. Carotid bruit is not present. Mass (left lobe, but seems to have diffuse goiter also) present.    Cardiovascular: Normal rate and normal heart sounds.   No murmur heard. Pulmonary/Chest: Effort normal. She has wheezes (right lung field only).  Abdominal: Soft. Bowel sounds are normal. There is no tenderness.  Neurological: She is alert and oriented to person, place, and time. She has normal reflexes.  Skin: Skin is warm and dry.  Psychiatric: She has a normal mood and affect. Her behavior is normal. Judgment and thought content normal.    Data Reviewed Results from ultrasound guided biopsy and previous examinations  Assessment    Benign left thyroid nodule, needle biopsy negative for malignancy    Plan    Follow up in 6 months, but did not recommend surgery at this time.       Cherylynn Ridges 10/25/2011, 8:49 AM

## 2011-12-26 ENCOUNTER — Ambulatory Visit: Payer: Medicaid Other | Attending: Podiatry | Admitting: Physical Therapy

## 2011-12-26 DIAGNOSIS — M25579 Pain in unspecified ankle and joints of unspecified foot: Secondary | ICD-10-CM | POA: Insufficient documentation

## 2011-12-26 DIAGNOSIS — R262 Difficulty in walking, not elsewhere classified: Secondary | ICD-10-CM | POA: Insufficient documentation

## 2011-12-26 DIAGNOSIS — IMO0001 Reserved for inherently not codable concepts without codable children: Secondary | ICD-10-CM | POA: Insufficient documentation

## 2012-01-09 ENCOUNTER — Ambulatory Visit: Payer: Medicaid Other | Attending: Podiatry | Admitting: Physical Therapy

## 2012-01-09 DIAGNOSIS — R262 Difficulty in walking, not elsewhere classified: Secondary | ICD-10-CM | POA: Insufficient documentation

## 2012-01-09 DIAGNOSIS — M25579 Pain in unspecified ankle and joints of unspecified foot: Secondary | ICD-10-CM | POA: Insufficient documentation

## 2012-01-09 DIAGNOSIS — IMO0001 Reserved for inherently not codable concepts without codable children: Secondary | ICD-10-CM | POA: Insufficient documentation

## 2012-01-16 ENCOUNTER — Encounter: Payer: Medicaid Other | Admitting: Physical Therapy

## 2012-01-23 ENCOUNTER — Encounter: Payer: Medicaid Other | Admitting: Physical Therapy

## 2012-03-05 ENCOUNTER — Ambulatory Visit: Payer: Medicaid Other | Attending: Podiatry | Admitting: Physical Therapy

## 2012-03-05 DIAGNOSIS — R262 Difficulty in walking, not elsewhere classified: Secondary | ICD-10-CM | POA: Insufficient documentation

## 2012-03-05 DIAGNOSIS — M25579 Pain in unspecified ankle and joints of unspecified foot: Secondary | ICD-10-CM | POA: Insufficient documentation

## 2012-03-05 DIAGNOSIS — IMO0001 Reserved for inherently not codable concepts without codable children: Secondary | ICD-10-CM | POA: Insufficient documentation

## 2012-03-13 ENCOUNTER — Encounter (INDEPENDENT_AMBULATORY_CARE_PROVIDER_SITE_OTHER): Payer: Medicaid Other | Admitting: General Surgery

## 2012-04-17 ENCOUNTER — Encounter (INDEPENDENT_AMBULATORY_CARE_PROVIDER_SITE_OTHER): Payer: Medicaid Other | Admitting: General Surgery

## 2012-04-17 ENCOUNTER — Other Ambulatory Visit (HOSPITAL_COMMUNITY): Payer: Self-pay | Admitting: Obstetrics

## 2012-04-17 DIAGNOSIS — Z3481 Encounter for supervision of other normal pregnancy, first trimester: Secondary | ICD-10-CM

## 2012-04-19 ENCOUNTER — Ambulatory Visit (HOSPITAL_COMMUNITY)
Admission: RE | Admit: 2012-04-19 | Discharge: 2012-04-19 | Disposition: A | Discharge status: Home / Self Care | Payer: Medicaid Other | Source: Ambulatory Visit | Attending: Obstetrics | Admitting: Obstetrics

## 2012-04-19 ENCOUNTER — Telehealth (INDEPENDENT_AMBULATORY_CARE_PROVIDER_SITE_OTHER): Payer: Self-pay

## 2012-04-19 ENCOUNTER — Inpatient Hospital Stay (HOSPITAL_COMMUNITY): Admission: AD | Admit: 2012-04-19 | Payer: Medicaid Other | Admitting: Obstetrics

## 2012-04-19 DIAGNOSIS — Z3481 Encounter for supervision of other normal pregnancy, first trimester: Secondary | ICD-10-CM

## 2012-04-19 DIAGNOSIS — O3680X0 Pregnancy with inconclusive fetal viability, not applicable or unspecified: Secondary | ICD-10-CM | POA: Insufficient documentation

## 2012-04-19 DIAGNOSIS — O09529 Supervision of elderly multigravida, unspecified trimester: Secondary | ICD-10-CM | POA: Insufficient documentation

## 2012-04-19 NOTE — Telephone Encounter (Signed)
I called patient back and she states Dr Gaynell Face told her not to reschedule until after [facility] is completed and she is no longer having vaginal bleeding. She said she will call us back when she is ready to schedule.

## 2012-04-19 NOTE — Telephone Encounter (Signed)
Message copied by Brennan Bailey on Thu Apr 19, 2012  3:08 PM ------      Message from: Zacarias Pontes      Created: Wed Apr 18, 2012  9:57 AM       Pt missed her apt yesterday she said she wasn't contacted before hand but she called in on the 13th to confirm her apt date.Marland Kitchenanyway she needs another apt and it needs to be mid day because she has to pick up her kid from school.She also said that her OB told her that her records says that her thyroid is cancerous,so she needs an apt right away???????   ------

## 2012-04-25 ENCOUNTER — Encounter (HOSPITAL_COMMUNITY): Payer: Self-pay | Admitting: *Deleted

## 2012-04-25 ENCOUNTER — Inpatient Hospital Stay (HOSPITAL_COMMUNITY): Payer: Medicaid Other

## 2012-04-25 ENCOUNTER — Inpatient Hospital Stay (HOSPITAL_COMMUNITY)
Admission: AD | Admit: 2012-04-25 | Discharge: 2012-04-25 | Disposition: A | Discharge status: Home / Self Care | Payer: Medicaid Other | Source: Ambulatory Visit | Attending: Obstetrics | Admitting: Obstetrics

## 2012-04-25 DIAGNOSIS — R109 Unspecified abdominal pain: Secondary | ICD-10-CM | POA: Insufficient documentation

## 2012-04-25 DIAGNOSIS — O039 Complete or unspecified spontaneous abortion without complication: Secondary | ICD-10-CM | POA: Insufficient documentation

## 2012-04-25 LAB — URINALYSIS, ROUTINE W REFLEX MICROSCOPIC
Glucose, UA: NEGATIVE mg/dL
Ketones, ur: NEGATIVE mg/dL
Nitrite: NEGATIVE
Protein, ur: NEGATIVE mg/dL

## 2012-04-25 LAB — CBC
HCT: 39.6 % (ref 36.0–46.0)
Hemoglobin: 13.6 g/dL (ref 12.0–15.0)
MCV: 85.2 fL (ref 78.0–100.0)
WBC: 6.7 10*3/uL (ref 4.0–10.5)

## 2012-04-25 LAB — HCG, QUANTITATIVE, PREGNANCY: hCG, Beta Chain, Quant, S: 1852 m[IU]/mL — ABNORMAL HIGH (ref <5)

## 2012-04-25 LAB — URINE MICROSCOPIC-ADD ON

## 2012-04-25 MED ORDER — PROMETHAZINE HCL 25 MG PO TABS: 25.0000 mg | ORAL_TABLET | Freq: Four times a day (QID) | ORAL | Status: DC | PRN

## 2012-04-25 MED ORDER — ONDANSETRON 8 MG PO TBDP
8.0000 mg | ORAL_TABLET | Freq: Once | ORAL | Status: AC
  Administered 2012-04-25: 8 mg via ORAL
  Filled 2012-04-25: qty 1

## 2012-04-25 MED ORDER — IBUPROFEN 600 MG PO TABS: 600.0000 mg | ORAL_TABLET | Freq: Four times a day (QID) | ORAL | Status: DC | PRN

## 2012-04-25 MED ORDER — OXYCODONE-ACETAMINOPHEN 5-325 MG PO TABS: 2.0000 | ORAL_TABLET | ORAL | Status: DC | PRN

## 2012-04-25 MED ORDER — IBUPROFEN 800 MG PO TABS
800.0000 mg | ORAL_TABLET | Freq: Once | ORAL | Status: AC
  Administered 2012-04-25: 800 mg via ORAL
  Filled 2012-04-25: qty 1

## 2012-04-25 MED ORDER — OXYCODONE-ACETAMINOPHEN 5-325 MG PO TABS
2.0000 | ORAL_TABLET | Freq: Once | ORAL | Status: DC
  Filled 2012-04-25: qty 2

## 2012-04-25 NOTE — MAU Note (Signed)
Percocet ordered for pt, pt unable to find a ride here and she drove herself. With reconsider pain med

## 2012-04-25 NOTE — MAU Note (Signed)
Pt reports cramping and bleeding tonight.

## 2012-04-25 NOTE — MAU Provider Note (Signed)
History     CSN: [id number]  Arrival date and time: 04/25/12 0108   First Provider Initiated Contact with Patient 04/25/12 0155      Chief Complaint  Patient presents with  . Abdominal Pain   HPI Ms. Catherine Osborne is a 37 y.o. [id number] at 7w 5d who presents to MAU today with complaint of abdominal pain and vaginal bleeding. The patient states that she was seen for [place] on 04/19/12 and told that she would have a miscarriage. She started having cramping around 8pm last night and woke up around midnight with heavy vaginal bleeding. She is rating her pain at 8/10 now. She denies N/V or fever. The patient states that she has been given a medicine to help her miscarry but has not used it yet because Dr. Gaynell Face said to make sure that the Chlamydia infection was treated first. She was able to take the medication with vomiting early Tuesday morning.   OB History   Grav Para Term Preterm Abortions TAB SAB Ect Mult Living   7    5 2 3   1       Past Medical History  Diagnosis Date  . Asthma   . Pneumonia   . Diabetes mellitus     Past Surgical History  Procedure Laterality Date  . Dnc    . Foot surgery      Family History  Problem Relation Age of Onset  . Bone cancer Mother   . Breast cancer Maternal Grandmother   . Diabetes    . Hypertension    . Stroke    . Heart attack      History  Substance Use Topics  . Smoking status: Current Every Day Smoker  . Smokeless tobacco: Not on file  . Alcohol Use: Yes    Allergies: No Known Allergies  Prescriptions prior to admission  Medication Sig Dispense Refill  . albuterol (PROVENTIL HFA;VENTOLIN HFA) 108 (90 BASE) MCG/ACT inhaler Inhale 1-2 puffs into the lungs every 6 (six) hours as needed for wheezing.  1 Inhaler  0  . valACYclovir (VALTREX) 500 MG tablet Take 500 mg by mouth 2 (two) times daily.      . [DISCONTINUED] oxyCODONE-acetaminophen (PERCOCET) 7.5-325 MG per tablet Take 1 tablet by mouth every 4 (four) hours as  needed.        Review of Systems  Constitutional: Negative for fever.  Gastrointestinal: Positive for abdominal pain. Negative for nausea and vomiting.  Genitourinary:       + vaginal bleeding  Neurological: Negative for dizziness.   Physical Exam   Blood pressure 111/65, pulse 80, temperature 98.2 F (36.8 C), temperature source Oral, resp. rate 18, height 5\' 2"  (1.575 m), weight 147 lb (66.679 kg), SpO2 100.00%.  Physical Exam  Constitutional: She is oriented to person, place, and time. She appears well-developed and well-nourished. No distress.  HENT:  Head: Normocephalic and atraumatic.  Cardiovascular: Normal rate, regular rhythm and normal heart sounds.   Respiratory: Effort normal and breath sounds normal. No respiratory distress.  GI: Soft. Bowel sounds are normal. She exhibits no distension and no mass. There is tenderness (diffuse tenderness to palpation more prominent in the suprapubic region). There is no rebound and no guarding.  Genitourinary: Vagina normal. Uterus is enlarged. Uterus is not tender. Cervix exhibits discharge (moderate amount of blood with one small clot at the cervical os and in the vagina). Cervix exhibits no motion tenderness and no friability. Right adnexum displays no mass and no  tenderness. Left adnexum displays no mass and no tenderness.  Neurological: She is alert and oriented to person, place, and time.  Skin: Skin is warm and dry. No erythema.   Results for orders placed during the hospital encounter of 04/25/12 (from the past 24 hour(s))  URINALYSIS, ROUTINE W REFLEX MICROSCOPIC     Status: Abnormal   Collection Time    04/25/12  1:23 AM      Result Value Range   Color, Urine YELLOW  YELLOW   APPearance CLEAR  CLEAR   Specific Gravity, Urine 1.020  1.005 - 1.030   pH 6.0  5.0 - 8.0   Glucose, UA NEGATIVE  NEGATIVE mg/dL   Hgb urine dipstick LARGE (*) NEGATIVE   Bilirubin Urine NEGATIVE  NEGATIVE   Ketones, ur NEGATIVE  NEGATIVE mg/dL    Protein, ur NEGATIVE  NEGATIVE mg/dL   Urobilinogen, UA 0.2  0.0 - 1.0 mg/dL   Nitrite NEGATIVE  NEGATIVE   Leukocytes, UA SMALL (*) NEGATIVE  URINE MICROSCOPIC-ADD ON     Status: Abnormal   Collection Time    04/25/12  1:23 AM      Result Value Range   Squamous Epithelial / LPF FEW (*) RARE   WBC, UA 3-6  <3 WBC/hpf   RBC / HPF 11-20  <3 RBC/hpf   Bacteria, UA FEW (*) RARE  CBC     Status: None   Collection Time    04/25/12  2:25 AM      Result Value Range   WBC 6.7  4.0 - 10.5 K/uL   RBC 4.65  3.87 - 5.11 MIL/uL   Hemoglobin 13.6  12.0 - 15.0 g/dL   HCT 04.5  40.9 - 81.1 %   MCV 85.2  78.0 - 100.0 fL   MCH 29.2  26.0 - 34.0 pg   MCHC 34.3  30.0 - 36.0 g/dL   RDW 91.4  78.2 - 95.6 %   Platelets 252  150 - 400 K/uL  ABO/RH     Status: None   Collection Time    04/25/12  2:25 AM      Result Value Range   ABO/RH(D) B POS    HCG, QUANTITATIVE, PREGNANCY     Status: Abnormal   Collection Time    04/25/12  2:25 AM      Result Value Range   hCG, Beta Chain, Quant, S 1852 (*) <5 mIU/mL    MAU Course  Procedures None  MDM [place] confirms non-viable pregnancy. Patient previously prescribed Cytotec by Dr. Gaynell Face, but asked to wait to use until after treatment for Chlamydia.  Cytotec would be appropriate at this time, however patient is asked to confirm with Dr. Gaynell Face in the morning. Appropriate pain management given and bleeding precautions discussed.   Assessment and Plan  A: SAB  P: Discharge home Rx for ibuprofen, percocet and phenergan given to the patient Bleeding precautions discussed Patient encouraged to call Dr. Elsie Stain office in the morning for instructions and follow-up Patient may return to MAU as needed or if her condition were to change or worsen  Freddi Starr, PA-C  04/25/2012, 3:50 AM

## 2012-04-26 LAB — URINE CULTURE: Colony Count: NO GROWTH

## 2012-05-03 ENCOUNTER — Ambulatory Visit (INDEPENDENT_AMBULATORY_CARE_PROVIDER_SITE_OTHER): Payer: Medicaid Other | Admitting: General Surgery

## 2012-05-09 ENCOUNTER — Other Ambulatory Visit (HOSPITAL_COMMUNITY): Payer: Self-pay | Admitting: Obstetrics

## 2012-05-09 DIAGNOSIS — O034 Incomplete spontaneous abortion without complication: Secondary | ICD-10-CM

## 2012-05-09 DIAGNOSIS — N939 Abnormal uterine and vaginal bleeding, unspecified: Secondary | ICD-10-CM

## 2012-05-09 DIAGNOSIS — R102 Pelvic and perineal pain: Secondary | ICD-10-CM

## 2012-05-11 ENCOUNTER — Other Ambulatory Visit: Payer: Self-pay | Admitting: Obstetrics

## 2012-05-11 ENCOUNTER — Other Ambulatory Visit (HOSPITAL_COMMUNITY): Payer: Self-pay | Admitting: Obstetrics

## 2012-05-11 ENCOUNTER — Ambulatory Visit (HOSPITAL_COMMUNITY)
Admission: RE | Admit: 2012-05-11 | Discharge: 2012-05-11 | Disposition: A | Discharge status: Home / Self Care | Payer: Medicaid Other | Source: Ambulatory Visit | Attending: Obstetrics | Admitting: Obstetrics

## 2012-05-11 DIAGNOSIS — R102 Pelvic and perineal pain: Secondary | ICD-10-CM

## 2012-05-11 DIAGNOSIS — O034 Incomplete spontaneous abortion without complication: Secondary | ICD-10-CM

## 2012-05-11 DIAGNOSIS — N939 Abnormal uterine and vaginal bleeding, unspecified: Secondary | ICD-10-CM

## 2012-05-11 DIAGNOSIS — O209 Hemorrhage in early pregnancy, unspecified: Secondary | ICD-10-CM | POA: Insufficient documentation

## 2012-05-11 DIAGNOSIS — Z3689 Encounter for other specified antenatal screening: Secondary | ICD-10-CM | POA: Insufficient documentation

## 2012-05-12 NOTE — H&P (Signed)
Catherine Osborne, Catherine Osborne NO.:  [id number]  MEDICAL RECORD NO.:  [id number]  LOCATION:  PERIO                         FACILITY:  [facility]  PHYSICIAN:  Kathreen Cosier, M.D.DATE OF BIRTH:  01-19-1976  DATE OF ADMISSION:  05/11/2012 DATE OF DISCHARGE:                             HISTORY & PHYSICAL   HISTORY OF PRESENT ILLNESS:  The patient is a 37 year old, gravida 6, para1-0-4-1, who had an early pregnancy, and on April 19, 2012, had bleeding.  Ultrasound showed 7-week IUFD.  She had Cytotec 800 mcg placed intravaginally and had subsequent bleeding, but she is still having some bleeding, and a repeat ultrasound on May 11, 2012, shows that the sac is still in the uterus.  So, she is scheduled for a D and E for incomplete AB.  PAST MEDICAL HISTORY:  She has had Chlamydia recently, which was treated.  She has a thyroid disease.  She has a goiter, which was biopsied and found to be benign.  She has not had any medication for her goiter.  She has a history of herpes and is on Valtrex 500 daily.  She has a history of asthma and uses albuterol inhaler.  PAST SURGICAL HISTORY:  Thyroid biopsy in 2005 and in August of 2013.  SOCIAL HISTORY:  Negative.  SYSTEM REVIEW:  Noncontributory.  PHYSICAL EXAMINATION:  GENERAL:  Revealed a well-developed female, in no distress. HEENT:  Negative. LUNGS:  Clear to P and A. HEART:  Regular rhythm.  No murmurs, no gallops. BREASTS:  Negative. ABDOMEN:  Negative. UTERUS:  Top-normal size.  Negative adnexa.  Speculum exam, a small amount of bleeding in the vagina. EXTREMITIES:  Negative.          ______________________________ Kathreen Cosier, M.D.     BAM/MEDQ  D:  05/11/2012  T:  05/12/2012  Job:  [id number]

## 2012-05-15 ENCOUNTER — Ambulatory Visit (HOSPITAL_COMMUNITY): Payer: Medicaid Other

## 2012-05-16 ENCOUNTER — Encounter (HOSPITAL_COMMUNITY): Payer: Self-pay | Admitting: Pharmacist

## 2012-05-22 ENCOUNTER — Encounter (HOSPITAL_COMMUNITY)
Admission: RE | Disposition: A | Discharge status: Home / Self Care | Payer: Self-pay | Source: Ambulatory Visit | Attending: Obstetrics

## 2012-05-22 ENCOUNTER — Ambulatory Visit (HOSPITAL_COMMUNITY): Payer: Medicaid Other | Admitting: Anesthesiology

## 2012-05-22 ENCOUNTER — Ambulatory Visit (HOSPITAL_COMMUNITY)
Admission: RE | Admit: 2012-05-22 | Discharge: 2012-05-22 | Disposition: A | Discharge status: Home / Self Care | Payer: Medicaid Other | Source: Ambulatory Visit | Attending: Obstetrics | Admitting: Obstetrics

## 2012-05-22 ENCOUNTER — Encounter (HOSPITAL_COMMUNITY): Payer: Self-pay | Admitting: *Deleted

## 2012-05-22 ENCOUNTER — Encounter (HOSPITAL_COMMUNITY): Payer: Self-pay | Admitting: Anesthesiology

## 2012-05-22 DIAGNOSIS — O034 Incomplete spontaneous abortion without complication: Secondary | ICD-10-CM | POA: Insufficient documentation

## 2012-05-22 HISTORY — PX: DILATION AND EVACUATION: SHX1459

## 2012-05-22 SURGERY — DILATION AND EVACUATION, UTERUS
Anesthesia: Monitor Anesthesia Care | Site: Vagina | Wound class: Clean Contaminated

## 2012-05-22 MED ORDER — FENTANYL CITRATE 0.05 MG/ML IJ SOLN
INTRAMUSCULAR | Status: DC | PRN
  Administered 2012-05-22 (×2): 50 ug via INTRAVENOUS
  Administered 2012-05-22: 100 ug via INTRAVENOUS

## 2012-05-22 MED ORDER — LIDOCAINE HCL (CARDIAC) 20 MG/ML IV SOLN
INTRAVENOUS | Status: AC
  Filled 2012-05-22: qty 5

## 2012-05-22 MED ORDER — GLYCOPYRROLATE 0.2 MG/ML IJ SOLN
INTRAMUSCULAR | Status: AC
  Filled 2012-05-22: qty 1

## 2012-05-22 MED ORDER — ONDANSETRON HCL 4 MG/2ML IJ SOLN
INTRAMUSCULAR | Status: AC
  Filled 2012-05-22: qty 2

## 2012-05-22 MED ORDER — GLYCOPYRROLATE 0.2 MG/ML IJ SOLN
INTRAMUSCULAR | Status: DC | PRN
  Administered 2012-05-22: 0.1 mg via INTRAVENOUS

## 2012-05-22 MED ORDER — ONDANSETRON HCL 4 MG/2ML IJ SOLN
INTRAMUSCULAR | Status: DC | PRN
  Administered 2012-05-22: 4 mg via INTRAVENOUS

## 2012-05-22 MED ORDER — MIDAZOLAM HCL 5 MG/5ML IJ SOLN
INTRAMUSCULAR | Status: DC | PRN
  Administered 2012-05-22: 2 mg via INTRAVENOUS

## 2012-05-22 MED ORDER — PROPOFOL 10 MG/ML IV EMUL
INTRAVENOUS | Status: DC | PRN
  Administered 2012-05-22: 80 mg via INTRAVENOUS
  Administered 2012-05-22: 20 mg via INTRAVENOUS
  Administered 2012-05-22: 40 mg via INTRAVENOUS
  Administered 2012-05-22: 80 mg via INTRAVENOUS

## 2012-05-22 MED ORDER — PHENYLEPHRINE 40 MCG/ML (10ML) SYRINGE FOR IV PUSH (FOR BLOOD PRESSURE SUPPORT)
PREFILLED_SYRINGE | INTRAVENOUS | Status: AC
  Filled 2012-05-22: qty 5

## 2012-05-22 MED ORDER — CEFAZOLIN SODIUM-DEXTROSE 2-3 GM-% IV SOLR
INTRAVENOUS | Status: AC
  Administered 2012-05-22: 2 g via INTRAVENOUS
  Filled 2012-05-22: qty 50

## 2012-05-22 MED ORDER — ALBUTEROL SULFATE HFA 108 (90 BASE) MCG/ACT IN AERS
INHALATION_SPRAY | RESPIRATORY_TRACT | Status: AC
  Filled 2012-05-22: qty 6.7

## 2012-05-22 MED ORDER — LIDOCAINE HCL (CARDIAC) 20 MG/ML IV SOLN
INTRAVENOUS | Status: DC | PRN
  Administered 2012-05-22: 80 mg via INTRAVENOUS

## 2012-05-22 MED ORDER — FENTANYL CITRATE 0.05 MG/ML IJ SOLN: 25.0000 ug | INTRAMUSCULAR | Status: DC | PRN

## 2012-05-22 MED ORDER — PROPOFOL INFUSION 10 MG/ML OPTIME
INTRAVENOUS | Status: DC | PRN
  Administered 2012-05-22: 100 ug/kg/min via INTRAVENOUS

## 2012-05-22 MED ORDER — PROPOFOL 10 MG/ML IV EMUL
INTRAVENOUS | Status: AC
  Filled 2012-05-22: qty 20

## 2012-05-22 MED ORDER — FENTANYL CITRATE 0.05 MG/ML IJ SOLN
INTRAMUSCULAR | Status: AC
  Filled 2012-05-22: qty 2

## 2012-05-22 MED ORDER — MIDAZOLAM HCL 2 MG/2ML IJ SOLN
INTRAMUSCULAR | Status: AC
  Filled 2012-05-22: qty 2

## 2012-05-22 MED ORDER — LACTATED RINGERS IV SOLN
INTRAVENOUS | Status: DC
  Administered 2012-05-22: 11:00:00 via INTRAVENOUS

## 2012-05-22 MED ORDER — KETOROLAC TROMETHAMINE 30 MG/ML IJ SOLN
INTRAMUSCULAR | Status: AC
  Filled 2012-05-22: qty 2

## 2012-05-22 MED ORDER — PHENYLEPHRINE HCL 10 MG/ML IJ SOLN
INTRAMUSCULAR | Status: DC | PRN
  Administered 2012-05-22: 80 ug via INTRAVENOUS

## 2012-05-22 MED ORDER — LIDOCAINE HCL 1 % IJ SOLN
INTRAMUSCULAR | Status: DC | PRN
  Administered 2012-05-22: 10 mL

## 2012-05-22 MED ORDER — FENTANYL CITRATE 0.05 MG/ML IJ SOLN
INTRAMUSCULAR | Status: AC
  Administered 2012-05-22: 50 ug via INTRAVENOUS
  Filled 2012-05-22: qty 2

## 2012-05-22 MED ORDER — DEXAMETHASONE SODIUM PHOSPHATE 10 MG/ML IJ SOLN
INTRAMUSCULAR | Status: AC
  Filled 2012-05-22: qty 1

## 2012-05-22 SURGICAL SUPPLY — 19 items
CATH ROBINSON RED A/P 16FR (CATHETERS) ×2 IMPLANT
CLOTH BEACON ORANGE TIMEOUT ST (SAFETY) ×2 IMPLANT
DECANTER SPIKE VIAL GLASS SM (MISCELLANEOUS) ×2 IMPLANT
GLOVE BIO SURGEON STRL SZ8.5 (GLOVE) ×2 IMPLANT
GOWN PREVENTION PLUS XXLARGE (GOWN DISPOSABLE) ×2 IMPLANT
GOWN STRL REIN XL XLG (GOWN DISPOSABLE) ×4 IMPLANT
KIT BERKELEY 1ST TRIMESTER 3/8 (MISCELLANEOUS) ×2 IMPLANT
NEEDLE SPNL 22GX3.5 QUINCKE BK (NEEDLE) ×2 IMPLANT
NS IRRIG 1000ML POUR BTL (IV SOLUTION) ×2 IMPLANT
PACK VAGINAL MINOR WOMEN LF (CUSTOM PROCEDURE TRAY) ×2 IMPLANT
PAD OB MATERNITY 4.3X12.25 (PERSONAL CARE ITEMS) ×2 IMPLANT
PAD PREP 24X48 CUFFED NSTRL (MISCELLANEOUS) ×2 IMPLANT
SET BERKELEY SUCTION TUBING (SUCTIONS) ×2 IMPLANT
SYR CONTROL 10ML LL (SYRINGE) ×2 IMPLANT
TOWEL OR 17X24 6PK STRL BLUE (TOWEL DISPOSABLE) ×4 IMPLANT
VACURETTE 10 RIGID CVD (CANNULA) ×2 IMPLANT
VACURETTE 7MM CVD STRL WRAP (CANNULA) IMPLANT
VACURETTE 8 RIGID CVD (CANNULA) ×2 IMPLANT
VACURETTE 9 RIGID CVD (CANNULA) IMPLANT

## 2012-05-22 NOTE — H&P (Signed)
  There has been no change in the patient's history and physical since the time of dictation except for a him social situation has made it difficult her to get out of surgery.

## 2012-05-22 NOTE — Anesthesia Postprocedure Evaluation (Signed)
  Anesthesia Post-op Note  Anesthesia Post Note  Patient: Catherine Osborne  Procedure(s) Performed: Procedure(s) (LRB): DILATATION AND EVACUATION (N/A)  Anesthesia type: MAC  Patient location: PACU  Post pain: Pain level controlled  Post assessment: Post-op Vital signs reviewed  Last Vitals:  Filed Vitals:   05/22/12 1409  BP:   Pulse:   Temp: 36.6 C  Resp:     Post vital signs: Reviewed  Level of consciousness: sedated  Complications: No apparent anesthesia complications

## 2012-05-22 NOTE — Anesthesia Preprocedure Evaluation (Signed)
Anesthesia Evaluation  Patient identified by MRN, date of birth, ID band Patient awake    Reviewed: Allergy & Precautions, H&P , Patient's Chart, lab work & pertinent test results, reviewed documented beta blocker date and time   Airway Mallampati: II TM Distance: >3 FB Neck ROM: full    Dental no notable dental hx.    Pulmonary asthma , Current Smoker,  breath sounds clear to auscultation  Pulmonary exam normal       Cardiovascular Rhythm:regular Rate:Normal     Neuro/Psych    GI/Hepatic   Endo/Other    Renal/GU      Musculoskeletal   Abdominal   Peds  Hematology   Anesthesia Other Findings   Reproductive/Obstetrics                           Anesthesia Physical Anesthesia Plan  ASA: II  Anesthesia Plan: MAC   Post-op Pain Management:    Induction: Intravenous  Airway Management Planned: LMA, Mask and Natural Airway  Additional Equipment:   Intra-op Plan:   Post-operative Plan:   Informed Consent: I have reviewed the patients History and Physical, chart, labs and discussed the procedure including the risks, benefits and alternatives for the proposed anesthesia with the patient or authorized representative who has indicated his/her understanding and acceptance.   Dental Advisory Given  Plan Discussed with: CRNA and Surgeon  Anesthesia Plan Comments:         Anesthesia Quick Evaluation

## 2012-05-22 NOTE — Op Note (Signed)
preop diagnosis spontaneous incomplete AB Postop diagnosis is same Anesthesia general Surgeon Dr. Francoise Ceo Procedure patient placed in lithotomy position perineum and vagina prepped and draped bladder a him to with a straight catheter speculum placed in the vagina and the uterus was 7 weeks size cervix injected with 10 cc 1% Xylocaine anterior lip of the cervix grasped with tenaculum date is endometrial cavity sounded 10 cm the cervix was noted to be open and dilated to 25 Pratt a #8 suction was placed in the endometrial cavity and suction performed onto the cavity was cleaned patient tolerated the procedure well taken to recovery room in good condition

## 2012-05-22 NOTE — Transfer of Care (Signed)
Immediate Anesthesia Transfer of Care Note  Patient: Catherine Osborne  Procedure(s) Performed: Procedure(s): DILATATION AND EVACUATION (N/A)  Patient Location: PACU  Anesthesia Type:General  Level of Consciousness: sedated, patient cooperative and responds to stimulation  Airway & Oxygen Therapy: Patient Spontanous Breathing and Patient connected to face mask oxygen  Post-op Assessment: Report given to PACU RN and Post -op Vital signs reviewed and stable  Post vital signs: stable  Complications: No apparent anesthesia complications

## 2012-05-22 NOTE — Anesthesia Procedure Notes (Signed)
Procedure Name: LMA Insertion Date/Time: 05/22/2012 11:50 AM Performed by: Graciela Husbands Pre-anesthesia Checklist: Patient identified, Emergency Drugs available, Suction available, Patient being monitored and Timeout performed Patient Re-evaluated:Patient Re-evaluated prior to inductionOxygen Delivery Method: Circle system utilized Preoxygenation: Pre-oxygenation with 100% oxygen Intubation Type: IV induction Ventilation: Mask ventilation without difficulty and Mask ventilation with difficulty LMA: LMA inserted Number of attempts: 1 Placement Confirmation: breath sounds checked- equal and bilateral and positive ETCO2 Tube secured with: Tape Dental Injury: Teeth and Oropharynx as per pre-operative assessment

## 2012-05-23 ENCOUNTER — Encounter (HOSPITAL_COMMUNITY): Payer: Self-pay | Admitting: Obstetrics

## 2012-06-10 ENCOUNTER — Encounter (HOSPITAL_COMMUNITY): Payer: Self-pay | Admitting: *Deleted

## 2012-06-10 ENCOUNTER — Emergency Department (HOSPITAL_COMMUNITY)
Admission: EM | Admit: 2012-06-10 | Discharge: 2012-06-11 | Disposition: A | Discharge status: Home / Self Care | Payer: Medicaid Other | Attending: Emergency Medicine | Admitting: Emergency Medicine

## 2012-06-10 DIAGNOSIS — E049 Nontoxic goiter, unspecified: Secondary | ICD-10-CM | POA: Insufficient documentation

## 2012-06-10 DIAGNOSIS — F17200 Nicotine dependence, unspecified, uncomplicated: Secondary | ICD-10-CM | POA: Insufficient documentation

## 2012-06-10 DIAGNOSIS — E010 Iodine-deficiency related diffuse (endemic) goiter: Secondary | ICD-10-CM

## 2012-06-10 DIAGNOSIS — R091 Pleurisy: Secondary | ICD-10-CM

## 2012-06-10 DIAGNOSIS — J209 Acute bronchitis, unspecified: Secondary | ICD-10-CM

## 2012-06-10 DIAGNOSIS — R0789 Other chest pain: Secondary | ICD-10-CM | POA: Insufficient documentation

## 2012-06-10 DIAGNOSIS — Z8701 Personal history of pneumonia (recurrent): Secondary | ICD-10-CM | POA: Insufficient documentation

## 2012-06-10 DIAGNOSIS — J45901 Unspecified asthma with (acute) exacerbation: Secondary | ICD-10-CM | POA: Insufficient documentation

## 2012-06-10 MED ORDER — ALBUTEROL SULFATE (5 MG/ML) 0.5% IN NEBU
5.0000 mg | INHALATION_SOLUTION | Freq: Once | RESPIRATORY_TRACT | Status: AC
  Administered 2012-06-11: 5 mg via RESPIRATORY_TRACT
  Filled 2012-06-10: qty 1

## 2012-06-10 NOTE — ED Notes (Signed)
Pt c/o cough beginning at 0200 this morning. Pt has dry, non-productive cough accompanied by chest pain w/ cough and inspiration. Pt denies cardiac hx and hx of lung disease. Pt has hx of pneumonia in past year.

## 2012-06-11 ENCOUNTER — Emergency Department (HOSPITAL_COMMUNITY): Payer: Medicaid Other

## 2012-06-11 LAB — CBC
Hemoglobin: 13 g/dL (ref 12.0–15.0)
MCHC: 33.7 g/dL (ref 30.0–36.0)
RDW: 13.8 % (ref 11.5–15.5)
WBC: 7.2 10*3/uL (ref 4.0–10.5)

## 2012-06-11 LAB — BASIC METABOLIC PANEL
BUN: 13 mg/dL (ref 6–23)
Creatinine, Ser: 0.82 mg/dL (ref 0.50–1.10)
GFR calc Af Amer: >90 mL/min (ref >90)
GFR calc non Af Amer: >90 mL/min (ref >90)
Potassium: 3.5 mEq/L (ref 3.5–5.1)

## 2012-06-11 LAB — POCT I-STAT TROPONIN I: Troponin i, poc: 0 ng/mL (ref 0.00–0.08)

## 2012-06-11 MED ORDER — OXYCODONE-ACETAMINOPHEN 5-325 MG PO TABS: 1.0000 | ORAL_TABLET | ORAL | Status: DC | PRN

## 2012-06-11 MED ORDER — ALBUTEROL SULFATE (5 MG/ML) 0.5% IN NEBU
2.5000 mg | INHALATION_SOLUTION | Freq: Once | RESPIRATORY_TRACT | Status: AC
  Administered 2012-06-11: 2.5 mg via RESPIRATORY_TRACT
  Filled 2012-06-11: qty 0.5

## 2012-06-11 MED ORDER — IPRATROPIUM BROMIDE 0.02 % IN SOLN
0.5000 mg | Freq: Once | RESPIRATORY_TRACT | Status: AC
  Administered 2012-06-11: 0.5 mg via RESPIRATORY_TRACT
  Filled 2012-06-11: qty 2.5

## 2012-06-11 MED ORDER — KETOROLAC TROMETHAMINE 60 MG/2ML IM SOLN
60.0000 mg | Freq: Once | INTRAMUSCULAR | Status: AC
  Administered 2012-06-11: 60 mg via INTRAMUSCULAR
  Filled 2012-06-11: qty 2

## 2012-06-11 MED ORDER — NAPROXEN 500 MG PO TABS: 500.0000 mg | ORAL_TABLET | Freq: Two times a day (BID) | ORAL | Status: DC

## 2012-06-11 MED ORDER — ALBUTEROL SULFATE HFA 108 (90 BASE) MCG/ACT IN AERS: 2.0000 | INHALATION_SPRAY | RESPIRATORY_TRACT | Status: DC | PRN

## 2012-06-11 MED ORDER — KETOROLAC TROMETHAMINE 30 MG/ML IJ SOLN: 30.0000 mg | Freq: Once | INTRAMUSCULAR | Status: DC

## 2012-06-11 MED ORDER — PREDNISONE 50 MG PO TABS: 50.0000 mg | ORAL_TABLET | Freq: Every day | ORAL | Status: DC

## 2012-06-11 MED ORDER — PREDNISONE 20 MG PO TABS
60.0000 mg | ORAL_TABLET | Freq: Once | ORAL | Status: AC
  Administered 2012-06-11: 60 mg via ORAL
  Filled 2012-06-11: qty 3

## 2012-06-11 NOTE — ED Provider Notes (Signed)
History     CSN: [id number]  Arrival date & time 06/10/12  2345   First MD Initiated Contact with Patient 06/11/12 0048      Chief Complaint  Patient presents with  . Pleurisy  . Shortness of Breath    (Consider location/radiation/quality/duration/timing/severity/associated sxs/prior treatment) Patient is a 37 y.o. female presenting with shortness of breath. The history is provided by the patient.  Shortness of Breath She has been having pain across her upper sternal area since 02 100. Pain is described as a tight feeling and is worse when she takes deep breath. She rates the pain at 6/10. There is no associated dyspnea, nausea, vomiting, diaphoresis, fever, chills. She has had a nonproductive cough. She's not taken any medication to try and help it. Of note: She is currently being evaluated for possible thyroid cancer. Her gynecologist has referred her to somebody but she's not certain who that was.  Past Medical History  Diagnosis Date  . Asthma   . Pneumonia     Past Surgical History  Procedure Laterality Date  . Dnc    . Foot surgery    . Dilation and evacuation N/A 05/22/2012    Procedure: DILATATION AND EVACUATION;  Surgeon: Kathreen Cosier, MD;  Location: [facility];  Service: Gynecology;  Laterality: N/A;    Family History  Problem Relation Age of Onset  . Bone cancer Mother   . Breast cancer Maternal Grandmother   . Diabetes    . Hypertension    . Stroke    . Heart attack      History  Substance Use Topics  . Smoking status: Current Every Day Smoker  . Smokeless tobacco: Not on file  . Alcohol Use: Yes    OB History   Grav Para Term Preterm Abortions TAB SAB Ect Mult Living   7    5 2 3   1       Review of Systems  Respiratory: Positive for shortness of breath.   All other systems reviewed and are negative.    Allergies  Review of patient's allergies indicates no known allergies.  Home Medications  No current outpatient prescriptions on  file.  BP 112/77  Pulse 85  Temp(Src) 98.5 F (36.9 C) (Oral)  Resp 22  Ht 5\' 2"  (1.575 m)  SpO2 96%  Physical Exam  Nursing note and vitals reviewed.  37 year old female, resting comfortably and in no acute distress. Vital signs are significant for mild tachypnea with respiratory rate of 22. Oxygen saturation is 96%, which is normal. Head is normocephalic and atraumatic. PERRLA, EOMI. Oropharynx is clear. Neck is nontender and supple without adenopathy or JVD. Thyroid is moderately enlarged and mobile. No discrete nodules palpated and consistency is normal. Back is nontender and there is no CVA tenderness. Lungs have coarse expiratory rhonchi with scattered inspiratory stridor. No rales are appreciated.. Chest is mildly tender over the upper sternal and parasternal area. Heart has regular rate and rhythm without murmur. Abdomen is soft, flat, nontender without masses or hepatosplenomegaly and peristalsis is normoactive. Extremities have no cyanosis or edema, full range of motion is present. Skin is warm and dry without rash. Neurologic: Mental status is normal, cranial nerves are intact, there are no motor or sensory deficits.  ED Course  Procedures (including critical care time)  Labs Reviewed  BASIC METABOLIC PANEL  CBC   Dg Chest 2 View (if Patient Has Fever And/or Copd)  06/11/2012  *RADIOLOGY REPORT*  Clinical Data: Pleurisy,  shortness of breath  CHEST - 2 VIEW  Comparison: 04/27/2011, 09/07/2011  Findings: Central peribronchial thickening.  No confluent airspace opacity.  No pleural effusion or pneumothorax.  Cardiomediastinal contours within normal range.  No acute osseous finding.  IMPRESSION: Central peribronchial thickening is nonspecific; may reflect bronchitis or sequelae of reactive airway disease.   Original Report Authenticated By: Jearld Lesch, M.D.    ECG shows normal sinus rhythm with a rate of 77, no ectopy. Normal axis. Normal P wave. Normal QRS. Normal  intervals. Normal ST and T waves. Impression: normal ECG. When compared with ECG of 03/17/2011, no significant changes are seen.  1. Acute bronchitis   2. Pleurisy   3. Thyromegaly       MDM  Chest pain seems most likely secondary to cough and bronchitis. Stridor is worrisome for possible tracheal compression from her goiter appear old records are reviewed and she did have a needle aspiration of her thyroid one year ago with findings consistent with benign goiter. None of her thyroid tests are in our computer system. She'll be given an albuterol with Atrovent nebulizer treatment and reassessed.  Breathing and cough are much improved following albuterol with Atrovent but she still is having chest discomfort. She was given a dose of ketorolac with excellent relief. She is given a dose of prednisone and is discharged with prescriptions for albuterol inhaler, naproxen, prednisone, and Percocet. She is encouraged to follow up with the physician is evaluating her thyroid gland.     Dione Booze, MD 06/11/12 [phone]

## 2012-06-14 ENCOUNTER — Other Ambulatory Visit: Payer: Self-pay | Admitting: Otolaryngology

## 2012-06-14 DIAGNOSIS — E041 Nontoxic single thyroid nodule: Secondary | ICD-10-CM

## 2012-06-26 ENCOUNTER — Ambulatory Visit
Admission: RE | Admit: 2012-06-26 | Discharge: 2012-06-26 | Disposition: A | Discharge status: Home / Self Care | Payer: Medicaid Other | Source: Ambulatory Visit | Attending: Otolaryngology | Admitting: Otolaryngology

## 2012-06-26 DIAGNOSIS — E041 Nontoxic single thyroid nodule: Secondary | ICD-10-CM

## 2012-09-02 ENCOUNTER — Emergency Department (HOSPITAL_COMMUNITY)
Admission: EM | Admit: 2012-09-02 | Discharge: 2012-09-03 | Disposition: A | Discharge status: Home / Self Care | Payer: Medicaid Other | Attending: Emergency Medicine | Admitting: Emergency Medicine

## 2012-09-02 ENCOUNTER — Emergency Department (HOSPITAL_COMMUNITY): Payer: Medicaid Other

## 2012-09-02 DIAGNOSIS — F17200 Nicotine dependence, unspecified, uncomplicated: Secondary | ICD-10-CM | POA: Insufficient documentation

## 2012-09-02 DIAGNOSIS — R062 Wheezing: Secondary | ICD-10-CM | POA: Insufficient documentation

## 2012-09-02 DIAGNOSIS — J40 Bronchitis, not specified as acute or chronic: Secondary | ICD-10-CM

## 2012-09-02 DIAGNOSIS — J45909 Unspecified asthma, uncomplicated: Secondary | ICD-10-CM | POA: Insufficient documentation

## 2012-09-02 DIAGNOSIS — Z79899 Other long term (current) drug therapy: Secondary | ICD-10-CM | POA: Insufficient documentation

## 2012-09-02 DIAGNOSIS — Z8701 Personal history of pneumonia (recurrent): Secondary | ICD-10-CM | POA: Insufficient documentation

## 2012-09-02 MED ORDER — AEROCHAMBER PLUS W/MASK MISC
1.0000 | Freq: Once | Status: AC
  Administered 2012-09-03: 1
  Filled 2012-09-02 (×3): qty 1

## 2012-09-02 MED ORDER — AEROCHAMBER Z-STAT PLUS/MEDIUM MISC: 1.0000 | Freq: Once | Status: DC

## 2012-09-02 MED ORDER — ALBUTEROL SULFATE HFA 108 (90 BASE) MCG/ACT IN AERS
2.0000 | INHALATION_SPRAY | RESPIRATORY_TRACT | Status: DC | PRN
  Administered 2012-09-03: 2 via RESPIRATORY_TRACT
  Filled 2012-09-02: qty 6.7

## 2012-09-02 MED ORDER — PREDNISONE 50 MG PO TABS: 50.0000 mg | ORAL_TABLET | Freq: Every day | ORAL | Status: DC

## 2012-09-02 NOTE — ED Notes (Signed)
Pt states that for two weeks she has had a bad cough that is causing her chest to hurt. Non productive cough.

## 2012-09-02 NOTE — ED Provider Notes (Signed)
CSN: [id number]     Arrival date & time 09/02/12  2259 History  This chart was scribed for non-physician practitioner working with Celene Kras, MD by Greggory Stallion, ED scribe. This patient was seen in room WTR7/WTR7 and the patient's care was started at 11:12 PM.   Chief Complaint  Patient presents with  . Cough   The history is provided by the patient. No language interpreter was used.    HPI Comments: Catherine Osborne is a 37 y.o. female who presents to the Emergency Department complaining of gradual onset, intermittent cough that started two weeks ago. Pt states she has just tried cough drops with no relief. Pt was here previously and diagnosed with bronchitis and was given an inhaler and states she hasn't used it. She never needed to use an inhaler before she was told she had bronchitis. Pt states she wheezes in the morning when she wakes up. Pt denies fever as associated symptoms. LNMP was last week and it was normal.   Past Medical History  Diagnosis Date  . Asthma   . Pneumonia    Past Surgical History  Procedure Laterality Date  . Dnc    . Foot surgery    . Dilation and evacuation N/A 05/22/2012    Procedure: DILATATION AND EVACUATION;  Surgeon: Kathreen Cosier, MD;  Location: [facility];  Service: Gynecology;  Laterality: N/A;   Family History  Problem Relation Age of Onset  . Bone cancer Mother   . Breast cancer Maternal Grandmother   . Diabetes    . Hypertension    . Stroke    . Heart attack     History  Substance Use Topics  . Smoking status: Current Every Day Smoker  . Smokeless tobacco: Not on file  . Alcohol Use: Yes   OB History   Grav Para Term Preterm Abortions TAB SAB Ect Mult Living   7    5 2 3   1      Review of Systems  All other systems reviewed and are negative.    Allergies  Review of patient's allergies indicates no known allergies.  Home Medications   Current Outpatient Rx  Name  Route  Sig  Dispense  Refill  . albuterol (PROVENTIL  HFA;VENTOLIN HFA) 108 (90 BASE) MCG/ACT inhaler   Inhalation   Inhale 2 puffs into the lungs every 6 (six) hours as needed for wheezing (sob).         Marland Kitchen albuterol (PROVENTIL HFA;VENTOLIN HFA) 108 (90 BASE) MCG/ACT inhaler   Inhalation   Inhale 2 puffs into the lungs every 4 (four) hours as needed for wheezing.   1 Inhaler   0   . ibuprofen (ADVIL,MOTRIN) 600 MG tablet   Oral   Take 600 mg by mouth every 6 (six) hours as needed for pain (pain).         . naproxen (NAPROSYN) 500 MG tablet   Oral   Take 1 tablet (500 mg total) by mouth 2 (two) times daily.   30 tablet   0   . oxyCODONE-acetaminophen (PERCOCET/ROXICET) 5-325 MG per tablet   Oral   Take 1 tablet by mouth every 4 (four) hours as needed for pain (pain).         Marland Kitchen oxyCODONE-acetaminophen (PERCOCET/ROXICET) 5-325 MG per tablet   Oral   Take 1 tablet by mouth every 4 (four) hours as needed for pain.   12 tablet   0   . predniSONE (DELTASONE) 50 MG  tablet   Oral   Take 1 tablet (50 mg total) by mouth daily.   5 tablet   0   . Prenatal Vit-Fe Fumarate-FA (MULTIVITAMIN-PRENATAL) 27-0.8 MG TABS   Oral   Take 1 tablet by mouth daily at 12 noon.         . valACYclovir (VALTREX) 500 MG tablet   Oral   Take 500 mg by mouth daily.          BP 116/80  Pulse 85  Temp(Src) 98.7 F (37.1 C) (Oral)  SpO2 99%  LMP 08/26/2012  Physical Exam  Nursing note and vitals reviewed. Constitutional: She is oriented to person, place, and time. She appears well-developed and well-nourished. No distress.  HENT:  Head: Normocephalic and atraumatic.  Eyes: EOM are normal.  Neck: Neck supple. No tracheal deviation present.  Cardiovascular: Normal rate.   Pulmonary/Chest: Effort normal. No respiratory distress.  Musculoskeletal: Normal range of motion.  Neurological: She is alert and oriented to person, place, and time.  Skin: Skin is warm and dry.  Psychiatric: She has a normal mood and affect. Her behavior is  normal.    ED Course   Procedures (including critical care time)  DIAGNOSTIC STUDIES: Oxygen Saturation is 99% on RA, normal by my interpretation.    COORDINATION OF CARE: 11:16 PM-Discussed treatment plan which includes breathing treatment with pt at bedside and pt agreed to plan.   Labs Reviewed - No data to display Dg Chest 2 View  09/02/2012   *RADIOLOGY REPORT*  Clinical Data: Cough  CHEST - 2 VIEW  Comparison: Prior radiograph from 06/11/2012  Findings: Cardiac and mediastinal silhouettes are stable in size and contour, and remain within normal limits.  Lungs are normally inflated.  Mild peribronchial thickening is again seen centrally, which may related to reactive airways disease.  No airspace consolidation, pleural effusion, or pulmonary edema.  No pneumothorax.  Osseous structures are unchanged.  IMPRESSION: Stable appearance of central peribronchial thickening, nonspecific, but may reflect bronchitis or reactive airways disease.  No airspace consolidation to suggest bacterial pneumonia.   Original Report Authenticated By: Rise Mu, M.D.   No diagnosis found.  MDM   There is no pneumonia identified on x-ray.  Patient will be treated with steroids, by mouth 50 mg daily for the next 5 days, as well as albuterol inhaler, 2 puffs every 4-6 hours for 2 days, then as needed.  Follow up with her primary care physician      I personally performed the services described in this documentation, which was scribed in my presence. The recorded information has been reviewed and is accurate.  Arman Filter, NP 09/02/12 2348

## 2012-09-04 NOTE — ED Provider Notes (Signed)
Medical screening examination/treatment/procedure(s) were performed by non-physician practitioner and as supervising physician I was immediately available for consultation/collaboration.    Colbie Danner R Destynee Stringfellow, MD 09/04/12 0518 

## 2013-02-20 ENCOUNTER — Emergency Department (INDEPENDENT_AMBULATORY_CARE_PROVIDER_SITE_OTHER): Payer: Medicaid Other

## 2013-02-20 ENCOUNTER — Emergency Department (HOSPITAL_COMMUNITY)
Admission: EM | Admit: 2013-02-20 | Discharge: 2013-02-20 | Disposition: A | Discharge status: Other Acute Inpatient Hospital | Payer: Medicaid Other | Source: Home / Self Care | Attending: Family Medicine | Admitting: Family Medicine

## 2013-02-20 ENCOUNTER — Emergency Department (HOSPITAL_COMMUNITY)
Admission: EM | Admit: 2013-02-20 | Discharge: 2013-02-20 | Disposition: A | Discharge status: Home / Self Care | Payer: Medicaid Other | Attending: Emergency Medicine | Admitting: Emergency Medicine

## 2013-02-20 ENCOUNTER — Encounter (HOSPITAL_COMMUNITY): Payer: Self-pay | Admitting: Emergency Medicine

## 2013-02-20 DIAGNOSIS — F17200 Nicotine dependence, unspecified, uncomplicated: Secondary | ICD-10-CM | POA: Insufficient documentation

## 2013-02-20 DIAGNOSIS — R079 Chest pain, unspecified: Secondary | ICD-10-CM

## 2013-02-20 DIAGNOSIS — J45901 Unspecified asthma with (acute) exacerbation: Secondary | ICD-10-CM | POA: Insufficient documentation

## 2013-02-20 DIAGNOSIS — Z8701 Personal history of pneumonia (recurrent): Secondary | ICD-10-CM | POA: Insufficient documentation

## 2013-02-20 DIAGNOSIS — F411 Generalized anxiety disorder: Secondary | ICD-10-CM | POA: Insufficient documentation

## 2013-02-20 DIAGNOSIS — J45909 Unspecified asthma, uncomplicated: Secondary | ICD-10-CM

## 2013-02-20 DIAGNOSIS — J111 Influenza due to unidentified influenza virus with other respiratory manifestations: Secondary | ICD-10-CM

## 2013-02-20 DIAGNOSIS — I498 Other specified cardiac arrhythmias: Secondary | ICD-10-CM

## 2013-02-20 DIAGNOSIS — Z79899 Other long term (current) drug therapy: Secondary | ICD-10-CM | POA: Insufficient documentation

## 2013-02-20 DIAGNOSIS — R000 Tachycardia, unspecified: Secondary | ICD-10-CM

## 2013-02-20 LAB — TROPONIN I: Troponin I: <0.3 ng/mL (ref <0.30)

## 2013-02-20 LAB — BASIC METABOLIC PANEL
BUN: 7 mg/dL (ref 6–23)
CO2: 20 mEq/L (ref 19–32)
Calcium: 8.8 mg/dL (ref 8.4–10.5)
Chloride: 100 mEq/L (ref 96–112)
Creatinine, Ser: 0.66 mg/dL (ref 0.50–1.10)
GFR calc Af Amer: >90 mL/min (ref >90)
GFR calc non Af Amer: >90 mL/min (ref >90)
GLUCOSE: 119 mg/dL — AB (ref 70–99)
POTASSIUM: 3.7 meq/L (ref 3.7–5.3)
Sodium: 138 mEq/L (ref 137–147)

## 2013-02-20 LAB — CBC WITH DIFFERENTIAL/PLATELET
BASOS PCT: 0 % (ref 0–1)
Basophils Absolute: 0 10*3/uL (ref 0.0–0.1)
EOS ABS: 0 10*3/uL (ref 0.0–0.7)
Eosinophils Relative: 0 % (ref 0–5)
HCT: 40.4 % (ref 36.0–46.0)
HEMOGLOBIN: 13.9 g/dL (ref 12.0–15.0)
LYMPHS ABS: 0.3 10*3/uL — AB (ref 0.7–4.0)
Lymphocytes Relative: 4 % — ABNORMAL LOW (ref 12–46)
MCH: 30 pg (ref 26.0–34.0)
MCHC: 34.4 g/dL (ref 30.0–36.0)
MCV: 87.1 fL (ref 78.0–100.0)
MONO ABS: 0.1 10*3/uL (ref 0.1–1.0)
Monocytes Relative: 2 % — ABNORMAL LOW (ref 3–12)
NEUTROS PCT: 94 % — AB (ref 43–77)
Neutro Abs: 7.2 10*3/uL (ref 1.7–7.7)
Platelets: 243 10*3/uL (ref 150–400)
RBC: 4.64 MIL/uL (ref 3.87–5.11)
RDW: 13.7 % (ref 11.5–15.5)
WBC: 7.7 10*3/uL (ref 4.0–10.5)

## 2013-02-20 LAB — D-DIMER, QUANTITATIVE: D-Dimer, Quant: <0.27 ug/mL-FEU (ref 0.00–0.48)

## 2013-02-20 MED ORDER — ALBUTEROL SULFATE HFA 108 (90 BASE) MCG/ACT IN AERS: 2.0000 | INHALATION_SPRAY | RESPIRATORY_TRACT | Status: DC | PRN

## 2013-02-20 MED ORDER — SODIUM CHLORIDE 0.9 % IV SOLN
Freq: Once | INTRAVENOUS | Status: AC
  Administered 2013-02-20: 12:00:00 via INTRAVENOUS

## 2013-02-20 MED ORDER — IPRATROPIUM BROMIDE 0.02 % IN SOLN
0.5000 mg | Freq: Once | RESPIRATORY_TRACT | Status: AC
  Administered 2013-02-20: 0.5 mg via RESPIRATORY_TRACT

## 2013-02-20 MED ORDER — PREDNISONE 20 MG PO TABS: 60.0000 mg | ORAL_TABLET | Freq: Every day | ORAL | Status: DC

## 2013-02-20 MED ORDER — OSELTAMIVIR PHOSPHATE 75 MG PO CAPS: 75.0000 mg | ORAL_CAPSULE | Freq: Two times a day (BID) | ORAL | Status: DC

## 2013-02-20 MED ORDER — HYDROCOD POLST-CHLORPHEN POLST 10-8 MG/5ML PO LQCR: 5.0000 mL | Freq: Two times a day (BID) | ORAL | Status: DC | PRN

## 2013-02-20 MED ORDER — IPRATROPIUM BROMIDE 0.02 % IN SOLN
RESPIRATORY_TRACT | Status: AC
  Filled 2013-02-20: qty 2.5

## 2013-02-20 MED ORDER — ALBUTEROL SULFATE (2.5 MG/3ML) 0.083% IN NEBU
INHALATION_SOLUTION | RESPIRATORY_TRACT | Status: AC
  Filled 2013-02-20: qty 6

## 2013-02-20 MED ORDER — ALBUTEROL SULFATE (2.5 MG/3ML) 0.083% IN NEBU
5.0000 mg | INHALATION_SOLUTION | Freq: Once | RESPIRATORY_TRACT | Status: AC
  Administered 2013-02-20: 5 mg via RESPIRATORY_TRACT

## 2013-02-20 MED ORDER — ACETAMINOPHEN 500 MG PO TABS
1000.0000 mg | ORAL_TABLET | Freq: Four times a day (QID) | ORAL | Status: DC | PRN
  Administered 2013-02-20: 1000 mg via ORAL
  Filled 2013-02-20: qty 2

## 2013-02-20 MED ORDER — IBUPROFEN 400 MG PO TABS
400.0000 mg | ORAL_TABLET | Freq: Once | ORAL | Status: AC
  Administered 2013-02-20: 400 mg via ORAL
  Filled 2013-02-20: qty 1

## 2013-02-20 MED ORDER — METHYLPREDNISOLONE SODIUM SUCC 125 MG IJ SOLR
125.0000 mg | Freq: Once | INTRAMUSCULAR | Status: AC
  Administered 2013-02-20: 125 mg via INTRAMUSCULAR

## 2013-02-20 MED ORDER — METHYLPREDNISOLONE SODIUM SUCC 125 MG IJ SOLR
INTRAMUSCULAR | Status: AC
  Filled 2013-02-20: qty 2

## 2013-02-20 NOTE — ED Provider Notes (Signed)
CSN: [id number]     Arrival date & time 02/20/13  1240 History   First MD Initiated Contact with Patient 02/20/13 1243     Chief Complaint  Patient presents with  . Cough  . Shortness of Breath   (Consider location/radiation/quality/duration/timing/severity/associated sxs/prior Treatment) HPI Comments: Patient presents to the ER for evaluation of difficulty breathing. Patient was referred to the ER from urgent care. Patient has history of sudden onset of fever, chills, cough, difficulty breathing yesterday. She has a history of asthma/COPD. In urgent care she was treated with albuterol with improvement of her wheezing, but was sent to the ER for further evaluation.  Patient is a 38 y.o. female presenting with cough and shortness of breath.  Cough Associated symptoms: shortness of breath   Shortness of Breath Associated symptoms: cough     Past Medical History  Diagnosis Date  . Asthma   . Pneumonia    Past Surgical History  Procedure Laterality Date  . Dnc    . Foot surgery    . Dilation and evacuation N/A 05/22/2012    Procedure: DILATATION AND EVACUATION;  Surgeon: Frederico Hamman, MD;  Location: [facility];  Service: Gynecology;  Laterality: N/A;   Family History  Problem Relation Age of Onset  . Bone cancer Mother   . Breast cancer Maternal Grandmother   . Diabetes    . Hypertension    . Stroke    . Heart attack     History  Substance Use Topics  . Smoking status: Current Every Day Smoker  . Smokeless tobacco: Not on file  . Alcohol Use: Yes   OB History   Grav Para Term Preterm Abortions TAB SAB Ect Mult Living   7    5 2 3   1      Review of Systems  Respiratory: Positive for cough and shortness of breath.   All other systems reviewed and are negative.    Allergies  Review of patient's allergies indicates no known allergies.  Home Medications   Current Outpatient Rx  Name  Route  Sig  Dispense  Refill  . albuterol (PROVENTIL HFA;VENTOLIN HFA) 108 (90  BASE) MCG/ACT inhaler   Inhalation   Inhale 2 puffs into the lungs every 6 (six) hours as needed for wheezing (sob).          BP 114/69  Pulse 109  Temp(Src) 99 F (37.2 C) (Oral)  Resp 31  SpO2 94%  LMP 02/06/2013 Physical Exam  Constitutional: She is oriented to person, place, and time. She appears well-developed and well-nourished. She appears distressed.  HENT:  Head: Normocephalic and atraumatic.  Right Ear: Hearing normal.  Left Ear: Hearing normal.  Nose: Nose normal.  Mouth/Throat: Oropharynx is clear and moist and mucous membranes are normal.  Eyes: Conjunctivae and EOM are normal. Pupils are equal, round, and reactive to light.  Neck: Normal range of motion. Neck supple.  Cardiovascular: Regular rhythm, S1 normal and S2 normal.  Exam reveals no gallop and no friction rub.   No murmur heard. Pulmonary/Chest: Effort normal and breath sounds normal. No respiratory distress. She exhibits no tenderness.  Abdominal: Soft. Normal appearance and bowel sounds are normal. There is no hepatosplenomegaly. There is no tenderness. There is no rebound, no guarding, no tenderness at McBurney's point and negative Murphy's sign. No hernia.  Musculoskeletal: Normal range of motion.  Neurological: She is alert and oriented to person, place, and time. She has normal strength. No cranial nerve deficit or sensory  deficit. Coordination normal. GCS eye subscore is 4. GCS verbal subscore is 5. GCS motor subscore is 6.  Skin: Skin is warm, dry and intact. No rash noted. No cyanosis.  Psychiatric: Her speech is normal and behavior is normal. Thought content normal. Her mood appears anxious.    ED Course  Procedures (including critical care time) Labs Review Labs Reviewed  CBC WITH DIFFERENTIAL - Abnormal; Notable for the following:    Neutrophils Relative % 94 (*)    Lymphocytes Relative 4 (*)    Lymphs Abs 0.3 (*)    Monocytes Relative 2 (*)    All other components within normal limits   BASIC METABOLIC PANEL - Abnormal; Notable for the following:    Glucose, Bld 119 (*)    All other components within normal limits  TROPONIN I  D-DIMER, QUANTITATIVE   Imaging Review Dg Chest 2 View  02/20/2013   CLINICAL DATA:  Cough and shortness of breath  EXAM: CHEST  2 VIEW  COMPARISON:  September 02, 2012  FINDINGS: Lungs are clear. Heart size and pulmonary vascularity are normal. No adenopathy. No bone lesions.  IMPRESSION: No abnormality noted.   Electronically Signed   By: Lowella Grip M.D.   On: 02/20/2013 11:47    EKG Interpretation    Date/Time:  Wednesday February 20 2013 12:50:22 EST Ventricular Rate:  114 PR Interval:  121 QRS Duration: 72 QT Interval:  308 QTC Calculation: 424 R Axis:   79 Text Interpretation:  Sinus tachycardia Ventricular premature complex Right atrial enlargement Probable anteroseptal infarct, old No significant change since last tracing Confirmed by POLLINA  MD, [place] ([id number]) on 02/20/2013 2:08:42 PM            MDM  Diagnosis: Asthma/COPD  Patient was treated appropriately with corticosteroids and bronchodilators for acute asthma/COPD exacerbation. She was sent to the ER for further management of continued rapid heart rate and painful breathing. It is felt that this was secondary to her inflammatory process in the chest wall and lungs. Her d-dimer was negative, there is no concern for PE. Patient will be continued on bronchodilators, prednisone, Tussionex. Continue to treat fever and discomfort with Motrin and Tylenol. Will add Tamiflu for possible influenza.    Orpah Greek, MD 02/20/13 1537

## 2013-02-20 NOTE — ED Notes (Signed)
Cough x 2 days, worse last night; tearful; audible wheezing; hist of childhood asthma; smoker

## 2013-02-20 NOTE — ED Notes (Signed)
Pt to department via [facility] from [facility]- pt reports a cough and SOB over the past couple of days. Pt transferred here for evaluation for a possible PE. BP-99/61 Hr-120 Also reports a fever.

## 2013-02-20 NOTE — ED Provider Notes (Signed)
Catherine Osborne is a 38 y.o. female who presents to Urgent Care today for 2 days of shortness of breath and wheezing. Patient has a remote history of asthma as a child. She is a current smoker. She has not tried any medications yet. She became very short of breath this morning. She notes headache chest pain and back pain. The chest pain is worse with deep inspiration and cough. She denies any significant leg swelling.  She additionally notes headache body ache and back pain. She denies any radiating pain weakness numbness.   Past Medical History  Diagnosis Date  . Asthma   . Pneumonia    History  Substance Use Topics  . Smoking status: Current Every Day Smoker  . Smokeless tobacco: Not on file  . Alcohol Use: Yes   ROS as above Medications: Current Facility-Administered Medications  Medication Dose Route Frequency Provider Last Rate Last Dose  . 0.9 %  sodium chloride infusion   Intravenous Once Gregor Hams, MD       Current Outpatient Prescriptions  Medication Sig Dispense Refill  . albuterol (PROVENTIL HFA;VENTOLIN HFA) 108 (90 BASE) MCG/ACT inhaler Inhale 2 puffs into the lungs every 6 (six) hours as needed for wheezing (sob).      Marland Kitchen albuterol (PROVENTIL HFA;VENTOLIN HFA) 108 (90 BASE) MCG/ACT inhaler Inhale 2 puffs into the lungs every 4 (four) hours as needed for wheezing.  1 Inhaler  0  . ibuprofen (ADVIL,MOTRIN) 600 MG tablet Take 600 mg by mouth every 6 (six) hours as needed for pain (pain).      . naproxen (NAPROSYN) 500 MG tablet Take 1 tablet (500 mg total) by mouth 2 (two) times daily.  30 tablet  0  . oxyCODONE-acetaminophen (PERCOCET/ROXICET) 5-325 MG per tablet Take 1 tablet by mouth every 4 (four) hours as needed for pain (pain).      Marland Kitchen oxyCODONE-acetaminophen (PERCOCET/ROXICET) 5-325 MG per tablet Take 1 tablet by mouth every 4 (four) hours as needed for pain.  12 tablet  0  . predniSONE (DELTASONE) 50 MG tablet Take 1 tablet (50 mg total) by mouth daily.  5 tablet  0   . Prenatal Vit-Fe Fumarate-FA (MULTIVITAMIN-PRENATAL) 27-0.8 MG TABS Take 1 tablet by mouth daily at 12 noon.      . valACYclovir (VALTREX) 500 MG tablet Take 500 mg by mouth daily.        Exam:  BP 122/86  Pulse 113  Temp(Src) 98.7 F (37.1 C) (Oral)  Resp 22  SpO2 95%  LMP 02/06/2013 These vitals were obtained prior to albuterol Gen: Initially very short of breath and panic appearing. Following nebulizer treatments patient continued to experience chest pain but was breathing much more comfortably and less anxious-appearing HEENT: EOMI,  MMM Lungs: Increased worker breathing and wheezing bilaterally. Following nebulizer treatments patient's lungs exam is clear with no wheezing Heart: Tachycardia of the right no MRG Abd: NABS, Soft. NT, ND Exts: Brisk capillary refill, warm and well perfused.   Patient was given 10 mg of albuterol and 0.5 mg of Atrovent in 2 nebulizer treatments. Her lung exam improved and she was breathing more comfortably however her fatigue and chest and back pain continued  12-lead EKG: Just after albuterol patient had sinus tachycardia with baseline muscle tremor at heart rate of 134 beats per minute. The EKG was repeated after 40 minutes and she continued to experience sinus tachycardia at 123 points per minutes. No significant ST depression or elevation.   No results found for  this or any previous visit (from the past 24 hour(s)). Dg Chest 2 View  02/20/2013   CLINICAL DATA:  Cough and shortness of breath  EXAM: CHEST  2 VIEW  COMPARISON:  September 02, 2012  FINDINGS: Lungs are clear. Heart size and pulmonary vascularity are normal. No adenopathy. No bone lesions.  IMPRESSION: No abnormality noted.   Electronically Signed   By: Lowella Grip M.D.   On: 02/20/2013 11:47    Assessment and Plan: 38 y.o. female with sinus tachycardia chest pain and back pain in the setting of asthma or COPD exacerbation. Patient certainly was having a COPD or asthma exacerbation  which was well treated with albuterol and Atrovent. However she continues to be very fatigued and have chest pain and sinus tachycardia following albuterol treatments. Specifically concerned about the possibility of a pulmonary embolism. Plan to transfer to the emergency room via EMS for evaluation and management. Discussed warning signs or symptoms. Please see discharge instructions. Patient expresses understanding.    Gregor Hams, MD 02/20/13 [phone]

## 2013-02-20 NOTE — ED Notes (Signed)
Pt. Ambulated to the restroom w/o difficulty.

## 2013-02-20 NOTE — ED Notes (Signed)
Requested a technician come to the room to fix the TV.

## 2013-02-20 NOTE — ED Notes (Signed)
MD at bedside. 

## 2013-02-20 NOTE — ED Notes (Signed)
Pt got 125 solu-medrol, prednisone, albuterol 5mg , atrovent 0.5mg  at [facility].

## 2013-07-01 ENCOUNTER — Other Ambulatory Visit: Payer: Self-pay | Admitting: Otolaryngology

## 2013-07-01 DIAGNOSIS — E041 Nontoxic single thyroid nodule: Secondary | ICD-10-CM

## 2013-07-10 ENCOUNTER — Ambulatory Visit
Admission: RE | Admit: 2013-07-10 | Discharge: 2013-07-10 | Disposition: A | Discharge status: Home / Self Care | Payer: Medicaid Other | Source: Ambulatory Visit | Attending: Otolaryngology | Admitting: Otolaryngology

## 2013-07-10 DIAGNOSIS — E041 Nontoxic single thyroid nodule: Secondary | ICD-10-CM

## 2013-11-04 LAB — PULMONARY FUNCTION TEST

## 2013-12-02 ENCOUNTER — Encounter (HOSPITAL_COMMUNITY): Payer: Self-pay | Admitting: Emergency Medicine

## 2014-02-21 ENCOUNTER — Ambulatory Visit: Payer: Medicaid Other | Admitting: Podiatry

## 2014-02-25 ENCOUNTER — Ambulatory Visit: Payer: Medicaid Other | Admitting: Podiatry

## 2014-03-06 ENCOUNTER — Ambulatory Visit (INDEPENDENT_AMBULATORY_CARE_PROVIDER_SITE_OTHER): Payer: Medicaid Other

## 2014-03-06 ENCOUNTER — Encounter: Payer: Self-pay | Admitting: Podiatry

## 2014-03-06 ENCOUNTER — Other Ambulatory Visit: Payer: Self-pay | Admitting: Podiatry

## 2014-03-06 ENCOUNTER — Ambulatory Visit (INDEPENDENT_AMBULATORY_CARE_PROVIDER_SITE_OTHER): Payer: Medicaid Other | Admitting: Podiatry

## 2014-03-06 VITALS — BP 114/59 | HR 79 | Resp 10 | Ht 62.0 in | Wt 138.0 lb

## 2014-03-06 DIAGNOSIS — M79672 Pain in left foot: Secondary | ICD-10-CM

## 2014-03-06 DIAGNOSIS — M7750 Other enthesopathy of unspecified foot: Secondary | ICD-10-CM

## 2014-03-06 DIAGNOSIS — Q6650 Congenital pes planus, unspecified foot: Secondary | ICD-10-CM

## 2014-03-06 DIAGNOSIS — M779 Enthesopathy, unspecified: Secondary | ICD-10-CM

## 2014-03-06 DIAGNOSIS — M216X9 Other acquired deformities of unspecified foot: Secondary | ICD-10-CM

## 2014-03-06 MED ORDER — TRIAMCINOLONE ACETONIDE 10 MG/ML IJ SUSP
10.0000 mg | Freq: Once | INTRAMUSCULAR | Status: AC
Start: 2014-03-06 — End: 2014-03-06
  Administered 2014-03-06: 10 mg

## 2014-03-06 MED ORDER — HYDROCODONE-ACETAMINOPHEN 5-325 MG PO TABS: 1.0000 | ORAL_TABLET | Freq: Four times a day (QID) | ORAL | Status: DC | PRN

## 2014-03-06 NOTE — Progress Notes (Signed)
   Subjective:    [name], female    DOB: 01/01/1976, 39 y.o.   MRN: [id number]  HPI Comments: Pt complains of pain and calloused skin at the left 5th MPJ area, and she states she had surgery to correct the problem 3 years ago.  Foot Pain Associated symptoms include congestion and coughing.      Review of Systems  HENT: Positive for congestion and sinus pressure.   Respiratory: Positive for cough and wheezing.   Gastrointestinal: Positive for diarrhea.  Endocrine: Positive for polydipsia.  Musculoskeletal: Positive for gait problem.  Allergic/Immunologic: Positive for environmental allergies.  All other systems reviewed and are negative.      Objective:   Physical Exam        Assessment & Plan:

## 2014-03-06 NOTE — Progress Notes (Signed)
Subjective:     [name], female   DOB: 1975/06/28, 39 y.o.   MRN: [id number]  HPI patient presents complaining of pain in the plantar aspect left fifth metatarsal head stating there is a lesion there that sore and she cannot trim herself and that she had surgery 3 years ago which was not successful   Review of Systems  All other systems reviewed and are negative.      Objective:   Physical Exam  Constitutional: She is oriented to person, place, and time.  Cardiovascular: Intact distal pulses.   Musculoskeletal: Normal range of motion.  Neurological: She is oriented to person, place, and time.  Skin: Skin is warm.  Nursing note and vitals reviewed.  neurovascular status intact with muscle strength adequate and range of motion subtalar midtarsal joint within normal limits. Patient is noted to have good digital perfusion is well oriented 3 and is noted to have no equinus condition noted plantar aspect fifth metatarsal head there is significant keratotic tissue formation and fluid buildup around the head plantarly     Assessment:     Capsulitis of the fifth MPJ left with inflammation and fluid buildup around the joint with lesion formation    Plan:     H&P performed and today I injected the capsule of the left fifth MPJ 3 mg Kenalog 5 mg Xylocaine reviewed x-ray and did deep debridement. I do not see further surgery which can be of benefit but I'm hoping to keep symptoms under control with conservative care

## 2014-05-29 ENCOUNTER — Ambulatory Visit: Payer: Medicaid Other | Admitting: Podiatry

## 2014-05-30 ENCOUNTER — Encounter: Payer: Self-pay | Admitting: Podiatry

## 2014-05-30 ENCOUNTER — Ambulatory Visit (INDEPENDENT_AMBULATORY_CARE_PROVIDER_SITE_OTHER): Payer: Medicaid Other | Admitting: Podiatry

## 2014-05-30 DIAGNOSIS — M79672 Pain in left foot: Secondary | ICD-10-CM | POA: Diagnosis not present

## 2014-05-30 DIAGNOSIS — M216X9 Other acquired deformities of unspecified foot: Secondary | ICD-10-CM

## 2014-05-30 DIAGNOSIS — Q667 Congenital pes cavus: Secondary | ICD-10-CM

## 2014-05-30 LAB — PULMONARY FUNCTION TEST

## 2014-05-31 NOTE — Progress Notes (Signed)
Subjective:     [name], female   DOB: 1975/07/18, 39 y.o.   MRN: [id number]  HPI patient presents with painful callus plantar aspect fifth metatarsal left that did okay with injection and trimming but has recurred fairly quickly   Review of Systems     Objective:   Physical Exam Neurovascular status intact with keratotic lesion fifth metatarsal head left that still painful when pressed    Assessment:     Plantar flexed fifth metatarsal left foot    Plan:     Discussed surgery for this condition with fifth metatarsal head resection being the only thing that may give her relief. At this time debridement accomplished and we will think about surgical intervention depending on response

## 2014-08-29 ENCOUNTER — Encounter: Payer: Self-pay | Admitting: Podiatry

## 2014-08-29 ENCOUNTER — Ambulatory Visit (INDEPENDENT_AMBULATORY_CARE_PROVIDER_SITE_OTHER): Payer: Medicaid Other | Admitting: Podiatry

## 2014-08-29 VITALS — BP 120/80 | HR 69 | Resp 16

## 2014-08-29 DIAGNOSIS — Q667 Congenital pes cavus: Secondary | ICD-10-CM | POA: Diagnosis not present

## 2014-08-29 DIAGNOSIS — M779 Enthesopathy, unspecified: Secondary | ICD-10-CM

## 2014-08-29 DIAGNOSIS — M216X9 Other acquired deformities of unspecified foot: Secondary | ICD-10-CM

## 2014-08-29 NOTE — Progress Notes (Signed)
Subjective:     [name], female   DOB: 01-11-1976, 39 y.o.   MRN: [id number]  HPIThis patient presents to the office with painful callus on the bottom of her left foot.  She says she works 12 hours a day in [employer] and the callus and foot become very painful.  She gives history of previous foot surgery by [facility] office.  She says he told her the callus would go away but it has returned.  She presents foir evaluation and treatment.   Review of Systems     Objective:   Physical Exam GENERAL APPEARANCE: Alert, conversant. Appropriately groomed. No acute distress.  VASCULAR: Pedal pulses palpable at 2/4 DP and PT bilateral.  Capillary refill time is immediate to all digits,  Proximal to distal cooling it warm to warm.  Digital hair growth is present bilateral  NEUROLOGIC: sensation is intact epicritically and protectively to 5.07 monofilament at 5/5 sites bilateral.  Light touch is intact bilateral, vibratory sensation intact bilateral, achilles tendon reflex is intact bilateral.  MUSCULOSKELETAL: acceptable muscle strength, tone and stability bilateral.  Intrinsic muscluature intact bilateral.  Rectus appearance of foot and digits noted bilateral.   DERMATOLOGIC: skin color, texture, and turgor are within normal limits.  No preulcerative lesions or ulcers  are seen, no interdigital maceration noted.  No open lesions present.  Digital nails are asymptomatic. No drainage noted. Severe callus sub 5 th metatarsal left foot.      Assessment:     Plantarflexed fifth metatarsal left foot.  IPK sub 5th metatarsal left foot.     Plan:     Debridement of single lesion.  Discussed condition with patient and I will refer her to surgeons in the practice.

## 2014-09-04 ENCOUNTER — Ambulatory Visit (INDEPENDENT_AMBULATORY_CARE_PROVIDER_SITE_OTHER): Payer: Medicaid Other | Admitting: Podiatry

## 2014-09-04 ENCOUNTER — Telehealth: Payer: Self-pay | Admitting: *Deleted

## 2014-09-04 ENCOUNTER — Ambulatory Visit (INDEPENDENT_AMBULATORY_CARE_PROVIDER_SITE_OTHER): Payer: Medicaid Other

## 2014-09-04 ENCOUNTER — Encounter: Payer: Self-pay | Admitting: Podiatry

## 2014-09-04 VITALS — BP 109/72 | HR 70 | Resp 18

## 2014-09-04 DIAGNOSIS — M216X9 Other acquired deformities of unspecified foot: Secondary | ICD-10-CM

## 2014-09-04 DIAGNOSIS — Q667 Congenital pes cavus: Secondary | ICD-10-CM

## 2014-09-04 DIAGNOSIS — M779 Enthesopathy, unspecified: Secondary | ICD-10-CM

## 2014-09-04 DIAGNOSIS — R52 Pain, unspecified: Secondary | ICD-10-CM

## 2014-09-04 MED ORDER — TRIAMCINOLONE ACETONIDE 10 MG/ML IJ SUSP
10.0000 mg | Freq: Once | INTRAMUSCULAR | Status: AC
  Administered 2014-09-04: 10 mg

## 2014-09-04 NOTE — Progress Notes (Signed)
Subjective:     [name], female   DOB: 10-08-1975, 39 y.o.   MRN: [id number]  HPI patient presents with inflammation underneath the fifth metatarsal head of the left foot and states that she had had surgery around 3 years ago and that it came back again fairly quickly   Review of Systems     Objective:   Physical Exam Neurovascular status intact muscle strength adequate with incision dorsal left foot and plantar flexed fifth metatarsal with prominence of the metatarsal of the fifth metatarsal left. Significant plantar callus formation is also noted and fluid buildup around the fifth MPJ    Assessment:     Inflammatory capsulitis left fifth MPJ with keratotic lesion and plantarflex metatarsal    Plan:     We discussed at great length considerations long-term for a fifth metatarsal head resection but due to her just starting a new job she cannot do it currently. Today I did inject the capsule 3 mg dexamethasone and Kenalog 5 mg Xylocaine and then deep debridement of lesion and reappoint when symptomatic

## 2014-09-04 NOTE — Telephone Encounter (Signed)
Pt asked if she had Percocet and took it this morning and rested would she be ready to go to work at 300pm.  I told pt the Percocet was a narcotic and could potentially cause drooziness, and she should not drive, operate heavy machinery, or be on high places while taking it.  I asked pt if she could tolerate Ibuprofen , then she should take it as the OTC package instructs, she stated she could.

## 2014-11-05 ENCOUNTER — Encounter (HOSPITAL_COMMUNITY): Payer: Self-pay | Admitting: *Deleted

## 2014-11-05 ENCOUNTER — Inpatient Hospital Stay (HOSPITAL_COMMUNITY): Payer: Medicaid Other

## 2014-11-05 ENCOUNTER — Inpatient Hospital Stay (HOSPITAL_COMMUNITY)
Admission: AD | Admit: 2014-11-05 | Discharge: 2014-11-05 | Disposition: A | Discharge status: Home / Self Care | Payer: Medicaid Other | Source: Ambulatory Visit | Attending: Obstetrics | Admitting: Obstetrics

## 2014-11-05 DIAGNOSIS — R109 Unspecified abdominal pain: Secondary | ICD-10-CM | POA: Diagnosis not present

## 2014-11-05 DIAGNOSIS — B9689 Other specified bacterial agents as the cause of diseases classified elsewhere: Secondary | ICD-10-CM

## 2014-11-05 DIAGNOSIS — O23591 Infection of other part of genital tract in pregnancy, first trimester: Secondary | ICD-10-CM | POA: Insufficient documentation

## 2014-11-05 DIAGNOSIS — J45909 Unspecified asthma, uncomplicated: Secondary | ICD-10-CM | POA: Diagnosis not present

## 2014-11-05 DIAGNOSIS — Z3A01 Less than 8 weeks gestation of pregnancy: Secondary | ICD-10-CM | POA: Diagnosis not present

## 2014-11-05 DIAGNOSIS — Z87891 Personal history of nicotine dependence: Secondary | ICD-10-CM | POA: Diagnosis not present

## 2014-11-05 DIAGNOSIS — O26891 Other specified pregnancy related conditions, first trimester: Secondary | ICD-10-CM | POA: Diagnosis present

## 2014-11-05 DIAGNOSIS — N760 Acute vaginitis: Secondary | ICD-10-CM | POA: Diagnosis not present

## 2014-11-05 DIAGNOSIS — E860 Dehydration: Secondary | ICD-10-CM | POA: Diagnosis not present

## 2014-11-05 DIAGNOSIS — O219 Vomiting of pregnancy, unspecified: Secondary | ICD-10-CM

## 2014-11-05 DIAGNOSIS — O26899 Other specified pregnancy related conditions, unspecified trimester: Secondary | ICD-10-CM

## 2014-11-05 LAB — CBC
HCT: 39.6 % (ref 36.0–46.0)
HEMOGLOBIN: 13.6 g/dL (ref 12.0–15.0)
MCH: 29.1 pg (ref 26.0–34.0)
MCHC: 34.3 g/dL (ref 30.0–36.0)
MCV: 84.8 fL (ref 78.0–100.0)
PLATELETS: 254 10*3/uL (ref 150–400)
RBC: 4.67 MIL/uL (ref 3.87–5.11)
RDW: 13.7 % (ref 11.5–15.5)
WBC: 7.4 10*3/uL (ref 4.0–10.5)

## 2014-11-05 LAB — URINALYSIS, ROUTINE W REFLEX MICROSCOPIC
Bilirubin Urine: NEGATIVE
Glucose, UA: NEGATIVE mg/dL
HGB URINE DIPSTICK: NEGATIVE
Ketones, ur: 40 mg/dL — AB
LEUKOCYTES UA: NEGATIVE
Nitrite: NEGATIVE
PH: 7 (ref 5.0–8.0)
PROTEIN: NEGATIVE mg/dL
SPECIFIC GRAVITY, URINE: 1.02 (ref 1.005–1.030)
Urobilinogen, UA: 1 mg/dL (ref 0.0–1.0)

## 2014-11-05 LAB — HCG, QUANTITATIVE, PREGNANCY: HCG, BETA CHAIN, QUANT, S: 54694 m[IU]/mL — AB (ref <5)

## 2014-11-05 LAB — WET PREP, GENITAL
TRICH WET PREP: NONE SEEN
Yeast Wet Prep HPF POC: NONE SEEN

## 2014-11-05 LAB — POCT PREGNANCY, URINE: Preg Test, Ur: POSITIVE — AB

## 2014-11-05 LAB — OB RESULTS CONSOLE HEPATITIS B SURFACE ANTIGEN: HEP B S AG: NEGATIVE

## 2014-11-05 LAB — OB RESULTS CONSOLE RUBELLA ANTIBODY, IGM: RUBELLA: IMMUNE

## 2014-11-05 MED ORDER — ACETAMINOPHEN 325 MG PO TABS: 650.0000 mg | ORAL_TABLET | Freq: Once | ORAL | Status: DC

## 2014-11-05 MED ORDER — METRONIDAZOLE 0.75 % VA GEL: 1.0000 | Freq: Every day | VAGINAL | Status: DC

## 2014-11-05 MED ORDER — DEXTROSE 5 % IN LACTATED RINGERS IV BOLUS
1000.0000 mL | Freq: Once | INTRAVENOUS | Status: AC
  Administered 2014-11-05: 1000 mL via INTRAVENOUS

## 2014-11-05 MED ORDER — PROMETHAZINE HCL 25 MG/ML IJ SOLN
12.5000 mg | Freq: Once | INTRAMUSCULAR | Status: AC
  Administered 2014-11-05: 12.5 mg via INTRAVENOUS
  Filled 2014-11-05: qty 1

## 2014-11-05 MED ORDER — FAMOTIDINE IN NACL 20-0.9 MG/50ML-% IV SOLN
20.0000 mg | Freq: Once | INTRAVENOUS | Status: AC
  Administered 2014-11-05: 20 mg via INTRAVENOUS
  Filled 2014-11-05: qty 50

## 2014-11-05 MED ORDER — PROMETHAZINE HCL 12.5 MG PO TABS: 12.5000 mg | ORAL_TABLET | Freq: Four times a day (QID) | ORAL | Status: DC | PRN

## 2014-11-05 MED ORDER — ACETAMINOPHEN 500 MG PO TABS
1000.0000 mg | ORAL_TABLET | Freq: Once | ORAL | Status: AC
  Administered 2014-11-05: 1000 mg via ORAL
  Filled 2014-11-05: qty 2

## 2014-11-05 NOTE — MAU Provider Note (Signed)
History     CSN: [id number]  Arrival date and time: 11/05/14 1128   First Provider Initiated Contact with Patient 11/05/14 1242      Chief Complaint  Patient presents with  . Abdominal Cramping  . Nausea   HPI   [name] is a 39 y.o. female [id number] at [redacted]w[redacted]d presenting with abdominal cramping, N/V. Symptoms started roughly two weeks ago with the pain that started 1 week ago.  The pain is located all over her stomach. The pain is cramp like; the pain comes and goes. She denies vaginal bleeding.  The is a desired pregnancy for her, however not for the babies father; this pregnancy was planned.    She has been vomiting constantly. She feels she is vomiting up the contents of her stomach. In the last 24 hours she has vomited over 6 times.    OB History    Gravida Para Term Preterm AB TAB SAB Ectopic Multiple Living   14 1  1 12 2 10   1       Past Medical History  Diagnosis Date  . Asthma   . Pneumonia   . Allergy     Past Surgical History  Procedure Laterality Date  . Dnc    . Foot surgery    . Dilation and evacuation N/A 05/22/2012    Procedure: DILATATION AND EVACUATION;  Surgeon: Frederico Hamman, MD;  Location: [facility];  Service: Gynecology;  Laterality: N/A;    Family History  Problem Relation Age of Onset  . Bone cancer Mother   . Breast cancer Maternal Grandmother   . Diabetes    . Hypertension    . Stroke    . Heart attack      Social History  Substance Use Topics  . Smoking status: Former Research scientist (life sciences)  . Smokeless tobacco: None  . Alcohol Use: No    Allergies: No Known Allergies  Prescriptions prior to admission  Medication Sig Dispense Refill Last Dose  . albuterol (PROVENTIL HFA;VENTOLIN HFA) 108 (90 BASE) MCG/ACT inhaler Inhale into the lungs every 6 (six) hours as needed for wheezing or shortness of breath.   Taking  . beclomethasone (QVAR) 40 MCG/ACT inhaler Inhale into the lungs 2 (two) times daily.   Taking  . EPIPEN 2-PAK 0.3  MG/0.3ML SOAJ injection INJECT INTO MUSCLE AS NEEDED AS DIRECTED FOR ALLERGIC REACTION  0   . HYDROcodone-acetaminophen (NORCO/VICODIN) 5-325 MG per tablet Take 1 tablet by mouth every 6 (six) hours as needed for moderate pain. 10 tablet 0 Taking  . montelukast (SINGULAIR) 10 MG tablet Take 10 mg by mouth at bedtime.   Taking  . oxyCODONE-acetaminophen (PERCOCET/ROXICET) 5-325 MG per tablet take 1 tablet by mouth every 4 hours  0   . valACYclovir (VALTREX) 1000 MG tablet   0 Taking  . Vitamin D, Ergocalciferol, (DRISDOL) 50000 UNITS CAPS capsule Take 50,000 Units by mouth once a week.  0    Results for orders placed or performed during the hospital encounter of 11/05/14 (from the past 24 hour(s))  Urinalysis, Routine w reflex microscopic (not at [facility])     Status: Abnormal   Collection Time: 11/05/14 11:50 AM  Result Value Ref Range   Color, Urine YELLOW YELLOW   APPearance CLEAR CLEAR   Specific Gravity, Urine 1.020 1.005 - 1.030   pH 7.0 5.0 - 8.0   Glucose, UA NEGATIVE NEGATIVE mg/dL   Hgb urine dipstick NEGATIVE NEGATIVE   Bilirubin Urine NEGATIVE NEGATIVE  Ketones, ur 40 (A) NEGATIVE mg/dL   Protein, ur NEGATIVE NEGATIVE mg/dL   Urobilinogen, UA 1.0 0.0 - 1.0 mg/dL   Nitrite NEGATIVE NEGATIVE   Leukocytes, UA NEGATIVE NEGATIVE  Pregnancy, urine POC     Status: Abnormal   Collection Time: 11/05/14 11:56 AM  Result Value Ref Range   Preg Test, Ur POSITIVE (A) NEGATIVE  CBC     Status: None   Collection Time: 11/05/14  1:10 PM  Result Value Ref Range   WBC 7.4 4.0 - 10.5 K/uL   RBC 4.67 3.87 - 5.11 MIL/uL   Hemoglobin 13.6 12.0 - 15.0 g/dL   HCT 39.6 36.0 - 46.0 %   MCV 84.8 78.0 - 100.0 fL   MCH 29.1 26.0 - 34.0 pg   MCHC 34.3 30.0 - 36.0 g/dL   RDW 13.7 11.5 - 15.5 %   Platelets 254 150 - 400 K/uL  hCG, quantitative, pregnancy     Status: Abnormal   Collection Time: 11/05/14  1:10 PM  Result Value Ref Range   hCG, Beta Chain, Quant, S 54694 (H) <5 mIU/mL  Wet prep,  genital     Status: Abnormal   Collection Time: 11/05/14  3:30 PM  Result Value Ref Range   Yeast Wet Prep HPF POC NONE SEEN NONE SEEN   Trich, Wet Prep NONE SEEN NONE SEEN   Clue Cells Wet Prep HPF POC MODERATE (A) NONE SEEN   WBC, Wet Prep HPF POC FEW (A) NONE SEEN   US Ob Comp Less 14 Wks  11/05/2014   CLINICAL DATA:  Abdominal pain, nausea  EXAM: OBSTETRIC <14 WK [place] AND TRANSVAGINAL OB US  TECHNIQUE: Both transabdominal and transvaginal ultrasound examinations were performed for complete evaluation of the gestation as well as the maternal uterus, adnexal regions, and pelvic cul-de-sac. Transvaginal technique was performed to assess early pregnancy.  COMPARISON:  None.  FINDINGS: Intrauterine gestational sac: Visualized/normal in shape.  Yolk sac:  Visualized  Embryo:  Visualized  Cardiac Activity: Visualized  Heart Rate: 113  bpm  MSD:   mm    w     d  CRL:  4.1  mm   6 w   1 d                  [place] EDC: 06/30/2015  Maternal uterus/adnexae: No subchorionic hemorrhage. No adnexal masses or free fluid.  IMPRESSION: 6 week 1 day intrauterine pregnancy. Fetal heart rate 113 beats per minute. No acute maternal findings.   Electronically Signed   By: Rolm Baptise M.D.   On: 11/05/2014 14:26   US Ob Transvaginal  11/05/2014   CLINICAL DATA:  Abdominal pain, nausea  EXAM: OBSTETRIC <14 WK [place] AND TRANSVAGINAL OB US  TECHNIQUE: Both transabdominal and transvaginal ultrasound examinations were performed for complete evaluation of the gestation as well as the maternal uterus, adnexal regions, and pelvic cul-de-sac. Transvaginal technique was performed to assess early pregnancy.  COMPARISON:  None.  FINDINGS: Intrauterine gestational sac: Visualized/normal in shape.  Yolk sac:  Visualized  Embryo:  Visualized  Cardiac Activity: Visualized  Heart Rate: 113  bpm  MSD:   mm    w     d  CRL:  4.1  mm   6 w   1 d                  [place] EDC: 06/30/2015  Maternal uterus/adnexae: No subchorionic hemorrhage. No adnexal masses  or free fluid.  IMPRESSION:  6 week 1 day intrauterine pregnancy. Fetal heart rate 113 beats per minute. No acute maternal findings.   Electronically Signed   By: Rolm Baptise M.D.   On: 11/05/2014 14:26    Review of Systems  Constitutional: Positive for chills. Negative for fever.  Gastrointestinal: Positive for nausea, vomiting, abdominal pain and constipation. Negative for diarrhea.  Genitourinary: Negative for dysuria and urgency.   Physical Exam   Blood pressure 107/62, pulse 66, temperature 98.9 F (37.2 C), temperature source Oral, resp. rate 16, height 5\' 2"  (1.575 m), weight 55.52 kg (122 lb 6.4 oz), last menstrual period 09/22/2014, SpO2 100 %.  Physical Exam  Constitutional: She is oriented to person, place, and time. She appears well-developed and well-nourished. No distress.  Respiratory: Effort normal.  GI: Soft. Normal appearance. There is generalized tenderness. There is no rigidity, no rebound and no guarding.  Musculoskeletal: Normal range of motion.  Neurological: She is alert and oriented to person, place, and time.  Skin: Skin is warm. She is not diaphoretic.    MAU Course  Procedures  None  MDM Urine shows 40 of ketones LR bolus Phenergan 12.5 Pepcid   Tylenol 650 mg PO Patient tolerated PO fluids and crackers; no witnessed vomiting in MAU  Assessment and Plan   A:  1. Nausea/vomiting in pregnancy   2. Abdominal pain in pregnancy, antepartum   3. BV (bacterial vaginosis)   4. Mild dehydration    P:  Discharge home in stable condition RX: Metrogel, phenergan Return to MAU if symptoms worsen Start prenatal care ASAP  Small, frequent meals.   Lezlie Lye, NP 11/05/2014 12:50 PM

## 2014-11-05 NOTE — Discharge Instructions (Signed)

## 2014-11-05 NOTE — MAU Note (Signed)
Patient states she had a positive pregnancy test at Dr. Marcheta Grammes office last month and has not been able to set up a follow up appointment there d/t scheduling conflict.  She presents today with abdominal cramping and and nausea.  Denies vaginal bleeding or LOF.

## 2014-11-06 LAB — GC/CHLAMYDIA PROBE AMP (~~LOC~~) NOT AT ARMC
Chlamydia: NEGATIVE
NEISSERIA GONORRHEA: POSITIVE — AB

## 2014-11-06 LAB — HIV ANTIBODY (ROUTINE TESTING W REFLEX): HIV Screen 4th Generation wRfx: NONREACTIVE

## 2014-11-14 ENCOUNTER — Inpatient Hospital Stay (HOSPITAL_COMMUNITY)
Admission: AD | Admit: 2014-11-14 | Discharge: 2014-11-14 | Discharge status: Absent without leave | Payer: Medicaid Other | Source: Ambulatory Visit | Attending: Obstetrics | Admitting: Obstetrics

## 2014-11-19 ENCOUNTER — Inpatient Hospital Stay (HOSPITAL_COMMUNITY)
Admission: EM | Admit: 2014-11-19 | Discharge: 2014-11-19 | Disposition: A | Discharge status: Home / Self Care | Payer: Medicaid Other | Source: Ambulatory Visit | Attending: Obstetrics | Admitting: Obstetrics

## 2014-11-19 ENCOUNTER — Encounter (HOSPITAL_COMMUNITY): Payer: Self-pay | Admitting: *Deleted

## 2014-11-19 DIAGNOSIS — O98211 Gonorrhea complicating pregnancy, first trimester: Secondary | ICD-10-CM | POA: Insufficient documentation

## 2014-11-19 DIAGNOSIS — R1013 Epigastric pain: Secondary | ICD-10-CM

## 2014-11-19 DIAGNOSIS — Z3A08 8 weeks gestation of pregnancy: Secondary | ICD-10-CM | POA: Insufficient documentation

## 2014-11-19 DIAGNOSIS — K30 Functional dyspepsia: Secondary | ICD-10-CM | POA: Diagnosis not present

## 2014-11-19 LAB — COMPREHENSIVE METABOLIC PANEL
ALBUMIN: 3.2 g/dL — AB (ref 3.5–5.0)
ALK PHOS: 37 U/L — AB (ref 38–126)
ALT: 29 U/L (ref 14–54)
AST: 30 U/L (ref 15–41)
Anion gap: 7 (ref 5–15)
BILIRUBIN TOTAL: 0.5 mg/dL (ref 0.3–1.2)
BUN: 6 mg/dL (ref 6–20)
CALCIUM: 8.6 mg/dL — AB (ref 8.9–10.3)
CO2: 24 mmol/L (ref 22–32)
CREATININE: 0.54 mg/dL (ref 0.44–1.00)
Chloride: 103 mmol/L (ref 101–111)
GFR calc Af Amer: >60 mL/min (ref >60)
GLUCOSE: 82 mg/dL (ref 65–99)
POTASSIUM: 3.8 mmol/L (ref 3.5–5.1)
Sodium: 134 mmol/L — ABNORMAL LOW (ref 135–145)
TOTAL PROTEIN: 6.3 g/dL — AB (ref 6.5–8.1)

## 2014-11-19 LAB — URINALYSIS, ROUTINE W REFLEX MICROSCOPIC
Bilirubin Urine: NEGATIVE
GLUCOSE, UA: NEGATIVE mg/dL
HGB URINE DIPSTICK: NEGATIVE
Ketones, ur: NEGATIVE mg/dL
Leukocytes, UA: NEGATIVE
Nitrite: NEGATIVE
PROTEIN: NEGATIVE mg/dL
Specific Gravity, Urine: 1.015 (ref 1.005–1.030)
Urobilinogen, UA: 0.2 mg/dL (ref 0.0–1.0)
pH: 7.5 (ref 5.0–8.0)

## 2014-11-19 LAB — CBC
HEMATOCRIT: 37.1 % (ref 36.0–46.0)
Hemoglobin: 12.7 g/dL (ref 12.0–15.0)
MCH: 29.1 pg (ref 26.0–34.0)
MCHC: 34.2 g/dL (ref 30.0–36.0)
MCV: 84.9 fL (ref 78.0–100.0)
Platelets: 239 10*3/uL (ref 150–400)
RBC: 4.37 MIL/uL (ref 3.87–5.11)
RDW: 13.6 % (ref 11.5–15.5)
WBC: 7.2 10*3/uL (ref 4.0–10.5)

## 2014-11-19 LAB — AMYLASE: AMYLASE: 134 U/L — AB (ref 28–100)

## 2014-11-19 LAB — LIPASE, BLOOD: Lipase: 25 U/L (ref 22–51)

## 2014-11-19 MED ORDER — FAMOTIDINE 20 MG PO TABS: 20.0000 mg | ORAL_TABLET | Freq: Two times a day (BID) | ORAL | Status: DC

## 2014-11-19 MED ORDER — GI COCKTAIL ~~LOC~~
30.0000 mL | Freq: Once | ORAL | Status: AC
  Administered 2014-11-19: 30 mL via ORAL
  Filled 2014-11-19: qty 30

## 2014-11-19 MED ORDER — CEFTRIAXONE SODIUM 250 MG IJ SOLR
250.0000 mg | Freq: Once | INTRAMUSCULAR | Status: AC
  Administered 2014-11-19: 250 mg via INTRAMUSCULAR
  Filled 2014-11-19: qty 250

## 2014-11-19 NOTE — MAU Note (Signed)
Pt presents to MAU with complaints of lower abdominal pain that started this morning. Pt states that she was treated gonorrhea two weeks ago. Denies any vaginal bleeding.

## 2014-11-19 NOTE — Discharge Instructions (Signed)
Heartburn During Pregnancy Heartburn is a burning sensation in the chest caused by stomach acid backing up into the esophagus. Heartburn is common in pregnancy because a certain hormone (progesterone) is released when a woman is pregnant. The progesterone hormone may relax the valve that separates the esophagus from the stomach. This allows acid to go up into the esophagus, causing heartburn. Heartburn may also happen in pregnancy because the enlarging uterus pushes up on the stomach, which pushes more acid into the esophagus. This is especially true in the later stages of pregnancy. Heartburn problems usually go away after giving birth. CAUSES  Heartburn is caused by stomach acid backing up into the esophagus. During pregnancy, this may result from various things, including:   The progesterone hormone.  Changing hormone levels.  The growing uterus pushing stomach acid upward.  Large meals.  Certain foods and drinks.  Exercise.  Increased acid production. SIGNS AND SYMPTOMS   Burning pain in the chest or lower throat.  Bitter taste in the mouth.  Coughing. DIAGNOSIS  Your health care provider will typically diagnose heartburn by taking a careful history of your concern. Blood tests may be done to check for a certain type of bacteria that is associated with heartburn. Sometimes, heartburn is diagnosed by prescribing a heartburn medicine to see if the symptoms improve. In some cases, a procedure called an endoscopy may be done. In this procedure, a tube with a light and a camera on the end (endoscope) is used to examine the esophagus and the stomach. TREATMENT  Treatment will vary depending on the severity of your symptoms. Your health care provider may recommend:  Over-the-counter medicines (antacids, acid reducers) for mild heartburn.  Prescription medicines to decrease stomach acid or to protect your stomach lining.  Certain changes in your diet.  Elevating the head of your bed  by putting blocks under the legs. This helps prevent stomach acid from backing up into the esophagus when you are lying down. HOME CARE INSTRUCTIONS   Only take over-the-counter or prescription medicines as directed by your health care provider.  Raise the head of your bed by putting blocks under the legs if instructed to do so by your health care provider. Sleeping with more pillows is not effective because it only changes the position of your head.  Do not exercise right after eating.  Avoid eating 2-3 hours before bed. Do not lie down right after eating.  Eat small meals throughout the day instead of three large meals.  Identify foods and beverages that make your symptoms worse and avoid them. Foods you may want to avoid include:  Peppers.  Chocolate.  High-fat foods, including fried foods.  Spicy foods.  Garlic and onions.  Citrus fruits, including oranges, grapefruit, lemons, and limes.  Food containing tomatoes or tomato products.  Mint.  Carbonated and caffeinated drinks.  Vinegar. SEEK MEDICAL CARE IF:  You have abdominal pain of any kind.  You feel burning in your upper abdomen or chest, especially after eating or lying down.  You have nausea and vomiting.  Your stomach feels upset after you eat. SEEK IMMEDIATE MEDICAL CARE IF:   You have severe chest pain that goes down your arm or into your jaw or neck.  You feel sweaty, dizzy, or light-headed.  You become short of breath.  You vomit blood.  You have difficulty or pain with swallowing.  You have bloody or black, tarry stools.  You have episodes of heartburn more than 3 times a week,  for more than 2 weeks. MAKE SURE YOU:  Understand these instructions.  Will watch your condition.  Will get help right away if you are not doing well or get worse.   This information is not intended to replace advice given to you by your health care provider. Make sure you discuss any questions you have with  your health care provider.   Document Released: 01/15/2000 Document Revised: 02/07/2014 Document Reviewed: 09/05/2012 Elsevier Interactive Patient Education [employer].     Gonorrhea Gonorrhea is an infection that can cause serious problems. If left untreated, the infection may:   Damage the female or female organs.   Cause women to be unable to have children (sterility).   Harm a fetus if the infected woman is pregnant.  It is important to get treatment for gonorrhea as soon as possible. It is also necessary that all your sexual partners be tested for the infection.  CAUSES  Gonorrhea is caused by bacteria called Neisseria gonorrhoeae. The infection is spread from person to person, usually by sexual contact (such as by anal, vaginal, or oral means). A newborn can contract the infection from his or her mother during birth.  RISK FACTORS  Being a woman younger than 38 years of age who is sexually active.  Being a woman 37 years of age or older who has:  A new sex partner.  More than one sex partner.  A sex partner who has a sexually transmitted disease (STD).  Using condoms inconsistently.  Currently having, or having previously had, an STD.  Exchanging sex or money or drugs. SYMPTOMS  Some people with gonorrhea do not have symptoms. Symptoms may be different in females and males.  Females The most common symptoms are:   Pain in the lower abdomen.   Fever with or without chills.  Other symptoms include:   Abnormal vaginal discharge.   Painful intercourse.   Burning or itching of the vagina or lips of the vagina.   Abnormal vaginal bleeding.   Pain when urinating.   Long-lasting (chronic) pain in the lower abdomen, especially during menstruation or intercourse.   Inability to become pregnant.   Going into premature labor.   Irritation, pain, bleeding, or discharge from the rectum. This may occur if the infection was spread by anal sex.    Sore throat or swollen lymph nodes in the neck. This may occur if the infection was spread by oral sex.  Males The most common symptoms are:   Discharge from the penis.   Pain or burning during urination.   Pain or swelling in the testicles. Other symptoms may include:   Irritation, pain, bleeding, or discharge from the rectum. This may occur if the infection was spread by anal sex.   Sore throat, fever, or swollen lymph nodes in the neck. This may occur if the infection was spread by oral sex.  DIAGNOSIS  A diagnosis is made after a physical exam is done and a sample of discharge is examined under a microscope for the presence of the bacteria. The discharge may be taken from the urethra, cervix, throat, or rectum.  TREATMENT  Gonorrhea is treated with antibiotic medicines. It is important for treatment to begin as soon as possible. Early treatment may prevent some problems from developing. Do not have sex. Avoid all types of sexual activity for 7 days after treatment is complete and until any sex partners have been treated. HOME CARE INSTRUCTIONS   Take medicines only as directed by  your health care provider.   Take your antibiotic medicine as directed by your health care provider. Finish the antibiotic even if you start to feel better. Incomplete treatment will put you at risk for continued infection.   Do not have sex until treatment is complete or as directed by your health care provider.   Keep all follow-up visits as directed by your health care provider.   Not all test results are available during your visit. If your test results are not back during the visit, make an appointment with your health care provider to find out the results. Do not assume everything is normal if you have not heard from your health care provider or the medical facility. It is your responsibility to get your test results.  If you test positive for gonorrhea, inform your recent sexual  partners. They need to be checked for gonorrhea even if they do not have symptoms. They may need treatment, even if they test negative for gonorrhea.  SEEK MEDICAL CARE IF:   You develop any bad reaction to the medicine you were prescribed. This may include:   A rash.   Nausea.   Vomiting.   Diarrhea.   Your symptoms do not improve after a few days of taking antibiotics.   Your symptoms get worse.   You develop increased pain, such as in the testicles (for males) or in the abdomen (for females).  You have a fever. MAKE SURE YOU:   Understand these instructions.  Will watch your condition.  Will get help right away if you are not doing well or get worse.   This information is not intended to replace advice given to you by your health care provider. Make sure you discuss any questions you have with your health care provider.   Document Released: 01/15/2000 Document Revised: 02/07/2014 Document Reviewed: 07/25/2012 Elsevier Interactive Patient Education [employer].

## 2014-11-19 NOTE — MAU Provider Note (Signed)
History     CSN: [id number]  Arrival date and time: 11/19/14 1212   First Provider Initiated Contact with Patient 11/19/14 1249         Chief Complaint  Patient presents with  . Abdominal Pain   HPI Catherine Osborne is a 39 y.o. [id number] at [redacted]w[redacted]d who presents with abdominal pain.  Epigastric pain since this morning with belching.  Describes pain as aching & rates 8/10. No treatment. Has not eaten since last night before 11 pm; has not slept in 24 hours d/t working & domestic argument with SO.  No lower abdominal pain, vaginal bleeding or vaginal discharge.  Denies nausea or vomiting.   [facility] gonorrhea treatment; received pills only, no injection. States the nurse at the health dept was unsure of the "safety of the shot" so didn't give it to her. Denies intercourse with partner since diagnosis. States partner was treated.     OB History    Gravida Para Term Preterm AB TAB SAB Ectopic Multiple Living   14 1  1 12 2 10   1       Past Medical History  Diagnosis Date  . Asthma   . Pneumonia   . Allergy     Past Surgical History  Procedure Laterality Date  . Dnc    . Foot surgery    . Dilation and evacuation N/A 05/22/2012    Procedure: DILATATION AND EVACUATION;  Surgeon: Frederico Hamman, MD;  Location: [facility];  Service: Gynecology;  Laterality: N/A;    Family History  Problem Relation Age of Onset  . Bone cancer Mother   . Breast cancer Maternal Grandmother   . Diabetes    . Hypertension    . Stroke    . Heart attack      Social History  Substance Use Topics  . Smoking status: Former Research scientist (life sciences)  . Smokeless tobacco: None  . Alcohol Use: No    Allergies: No Known Allergies  Prescriptions prior to admission  Medication Sig Dispense Refill Last Dose  . albuterol (PROVENTIL HFA;VENTOLIN HFA) 108 (90 BASE) MCG/ACT inhaler Inhale into the lungs every 6 (six) hours as needed for wheezing or shortness of breath.   Taking  .  beclomethasone (QVAR) 40 MCG/ACT inhaler Inhale into the lungs 2 (two) times daily.   Taking  . EPIPEN 2-PAK 0.3 MG/0.3ML SOAJ injection INJECT INTO MUSCLE AS NEEDED AS DIRECTED FOR ALLERGIC REACTION  0   . metroNIDAZOLE (METROGEL VAGINAL) 0.75 % vaginal gel Place 1 Applicatorful vaginally at bedtime. 70 g 0   . montelukast (SINGULAIR) 10 MG tablet Take 10 mg by mouth at bedtime.   Taking  . promethazine (PHENERGAN) 12.5 MG tablet Take 1 tablet (12.5 mg total) by mouth every 6 (six) hours as needed for nausea or vomiting. 30 tablet 0   . valACYclovir (VALTREX) 1000 MG tablet   0 Taking  . Vitamin D, Ergocalciferol, (DRISDOL) 50000 UNITS CAPS capsule Take 50,000 Units by mouth once a week.  0     Review of Systems  Constitutional: Negative.   Respiratory: Negative.   Cardiovascular: Negative.   Gastrointestinal: Positive for abdominal pain and constipation. Negative for heartburn, nausea, vomiting and diarrhea.  Genitourinary: Negative.    Physical Exam   Blood pressure 113/74, pulse 74, temperature 98 F (36.7 C), temperature source Oral, resp. rate 17, height 5\' 2"  (1.575 m), weight 124 lb 6.4 oz (56.427 kg), last menstrual period 09/22/2014, SpO2  100 %.  Physical Exam  Nursing note and vitals reviewed. Constitutional: She is oriented to person, place, and time. She appears well-developed and well-nourished. No distress.  HENT:  Head: Normocephalic and atraumatic.  Eyes: Conjunctivae are normal. Right eye exhibits no discharge. Left eye exhibits no discharge. No scleral icterus.  Neck: Normal range of motion.  Cardiovascular: Normal rate, regular rhythm and normal heart sounds.   No murmur heard. Respiratory: Effort normal and breath sounds normal. No respiratory distress. She has no wheezes.  GI: Soft. Bowel sounds are normal. She exhibits no distension. There is tenderness in the epigastric area.  Neurological: She is alert and oriented to person, place, and time.  Skin: Skin is  warm and dry. She is not diaphoretic.  Psychiatric: She has a normal mood and affect. Her behavior is normal. Judgment and thought content normal.    MAU Course  Procedures Results for orders placed or performed during the hospital encounter of 11/19/14 (from the past 24 hour(s))  Urinalysis, Routine w reflex microscopic (not at [facility])     Status: Abnormal   Collection Time: 11/19/14 12:13 PM  Result Value Ref Range   Color, Urine YELLOW YELLOW   APPearance CLOUDY (A) CLEAR   Specific Gravity, Urine 1.015 1.005 - 1.030   pH 7.5 5.0 - 8.0   Glucose, UA NEGATIVE NEGATIVE mg/dL   Hgb urine dipstick NEGATIVE NEGATIVE   Bilirubin Urine NEGATIVE NEGATIVE   Ketones, ur NEGATIVE NEGATIVE mg/dL   Protein, ur NEGATIVE NEGATIVE mg/dL   Urobilinogen, UA 0.2 0.0 - 1.0 mg/dL   Nitrite NEGATIVE NEGATIVE   Leukocytes, UA NEGATIVE NEGATIVE  CBC     Status: None   Collection Time: 11/19/14  1:20 PM  Result Value Ref Range   WBC 7.2 4.0 - 10.5 K/uL   RBC 4.37 3.87 - 5.11 MIL/uL   Hemoglobin 12.7 12.0 - 15.0 g/dL   HCT 37.1 36.0 - 46.0 %   MCV 84.9 78.0 - 100.0 fL   MCH 29.1 26.0 - 34.0 pg   MCHC 34.2 30.0 - 36.0 g/dL   RDW 13.6 11.5 - 15.5 %   Platelets 239 150 - 400 K/uL  Comprehensive metabolic panel     Status: Abnormal   Collection Time: 11/19/14  1:20 PM  Result Value Ref Range   Sodium 134 (L) 135 - 145 mmol/L   Potassium 3.8 3.5 - 5.1 mmol/L   Chloride 103 101 - 111 mmol/L   CO2 24 22 - 32 mmol/L   Glucose, Bld 82 65 - 99 mg/dL   BUN 6 6 - 20 mg/dL   Creatinine, Ser 0.54 0.44 - 1.00 mg/dL   Calcium 8.6 (L) 8.9 - 10.3 mg/dL   Total Protein 6.3 (L) 6.5 - 8.1 g/dL   Albumin 3.2 (L) 3.5 - 5.0 g/dL   AST 30 15 - 41 U/L   ALT 29 14 - 54 U/L   Alkaline Phosphatase 37 (L) 38 - 126 U/L   Total Bilirubin 0.5 0.3 - 1.2 mg/dL   GFR calc non Af Amer >60 >60 mL/min   GFR calc Af Amer >60 >60 mL/min   Anion gap 7 5 - 15  Amylase     Status: Abnormal   Collection Time: 11/19/14  1:20 PM   Result Value Ref Range   Amylase 134 (H) 28 - 100 U/L  Lipase, blood     Status: None   Collection Time: 11/19/14  1:20 PM  Result Value Ref Range  Lipase 25 22 - 51 U/L    MDM Rocephin given d/t likelihood that patient was not treated.  GI cocktail Pain improved with GI cocktail  Assessment and Plan  A: 1. Indigestion   2. Gonorrhea affecting pregnancy in first trimester    P: Discharge home Keep scheduled appt with Dr. Jodi Mourning Discussed reasons to return to MAU No intercourse for 7 days Rx Pepcid  Jorje Guild, NP  11/19/2014, 12:48 PM

## 2014-11-27 ENCOUNTER — Telehealth: Payer: Self-pay | Admitting: *Deleted

## 2014-11-27 NOTE — Telephone Encounter (Addendum)
Pt states she is having trouble with the area at the base of her 5th toe again, that she has every 3-6 months, and now she is pregnant.  Dr. Paulla Dolly states may have to open the area depending on the severity, it may need local anesthesia or if more severe may need to be taken care of at at surgical center, but her Ob/Gyn doctor would need to advise on anesthesia in writing.  Left message informing pt of Dr. Mellody Drown recommendations.  Pt called left the name of her [place] [phone].  I called and Josie had not spoken with the pt or had any messages, so I told her of the problem and told her we may need to contact her again once the pt had been evaluated for treatment options.  I contacted pt and explained we would need to see her to evaluate what may need to be done.  Pt was transferred to schedulers.

## 2014-12-02 ENCOUNTER — Ambulatory Visit (INDEPENDENT_AMBULATORY_CARE_PROVIDER_SITE_OTHER): Payer: Medicaid Other | Admitting: Podiatry

## 2014-12-02 ENCOUNTER — Encounter: Payer: Self-pay | Admitting: Podiatry

## 2014-12-02 DIAGNOSIS — Q828 Other specified congenital malformations of skin: Secondary | ICD-10-CM

## 2014-12-02 DIAGNOSIS — D492 Neoplasm of unspecified behavior of bone, soft tissue, and skin: Secondary | ICD-10-CM

## 2014-12-02 NOTE — Progress Notes (Signed)
[name], female   DOB: 06-10-1975, 39 y.o.   MRN: [id number]  Subjective: Presents today complaining of a painful callus sub-fifth left MPJ, medial right hallux and distal medial fifth right toe all these areas hurt when walking wearing shoes. Lesion on the fifth MPJ area was debrided on the visit of 09/04/2014 Patient states she is pregnant  Objective: Nucleated plantar keratoses sub-fifth left MPJ Keratoses medial right hallux Keratoses distal medial fifth toe right  Assessment: Porokeratosis 1 Keratoses 2  Plan: Debride all 3 lesions sharply without any bleeding Dispensed silicone way shoe insert in between fourth and fifth right toes Dispensed felt pads with cut out to attach into shoe insoles to offload plantar fifth MPJ  Reappoint at patient's with

## 2014-12-02 NOTE — Patient Instructions (Signed)
Attach felt pads in work shoes as instructed in office Return as needed for trimming

## 2014-12-16 ENCOUNTER — Inpatient Hospital Stay (HOSPITAL_COMMUNITY)
Admission: AD | Admit: 2014-12-16 | Discharge: 2014-12-16 | Disposition: A | Discharge status: Home / Self Care | Payer: Medicaid Other | Source: Ambulatory Visit | Attending: Obstetrics | Admitting: Obstetrics

## 2014-12-16 DIAGNOSIS — O210 Mild hyperemesis gravidarum: Secondary | ICD-10-CM | POA: Insufficient documentation

## 2014-12-16 DIAGNOSIS — Z87891 Personal history of nicotine dependence: Secondary | ICD-10-CM | POA: Diagnosis not present

## 2014-12-16 DIAGNOSIS — O219 Vomiting of pregnancy, unspecified: Secondary | ICD-10-CM | POA: Diagnosis not present

## 2014-12-16 DIAGNOSIS — Z3A12 12 weeks gestation of pregnancy: Secondary | ICD-10-CM | POA: Insufficient documentation

## 2014-12-16 LAB — COMPREHENSIVE METABOLIC PANEL
ALBUMIN: 3.4 g/dL — AB (ref 3.5–5.0)
ALT: 33 U/L (ref 14–54)
AST: 33 U/L (ref 15–41)
Alkaline Phosphatase: 42 U/L (ref 38–126)
Anion gap: 6 (ref 5–15)
BILIRUBIN TOTAL: 0.5 mg/dL (ref 0.3–1.2)
BUN: 8 mg/dL (ref 6–20)
CO2: 26 mmol/L (ref 22–32)
CREATININE: 0.52 mg/dL (ref 0.44–1.00)
Calcium: 9.2 mg/dL (ref 8.9–10.3)
Chloride: 101 mmol/L (ref 101–111)
GFR calc Af Amer: >60 mL/min (ref >60)
GLUCOSE: 87 mg/dL (ref 65–99)
POTASSIUM: 4.2 mmol/L (ref 3.5–5.1)
Sodium: 133 mmol/L — ABNORMAL LOW (ref 135–145)
TOTAL PROTEIN: 7 g/dL (ref 6.5–8.1)

## 2014-12-16 LAB — URINALYSIS, ROUTINE W REFLEX MICROSCOPIC
Bilirubin Urine: NEGATIVE
Glucose, UA: NEGATIVE mg/dL
Hgb urine dipstick: NEGATIVE
Ketones, ur: NEGATIVE mg/dL
Leukocytes, UA: NEGATIVE
Nitrite: NEGATIVE
PROTEIN: NEGATIVE mg/dL
SPECIFIC GRAVITY, URINE: 1.015 (ref 1.005–1.030)
pH: 8.5 — ABNORMAL HIGH (ref 5.0–8.0)

## 2014-12-16 LAB — CBC
HEMATOCRIT: 38.4 % (ref 36.0–46.0)
HEMOGLOBIN: 13 g/dL (ref 12.0–15.0)
MCH: 29.1 pg (ref 26.0–34.0)
MCHC: 33.9 g/dL (ref 30.0–36.0)
MCV: 86.1 fL (ref 78.0–100.0)
Platelets: 260 10*3/uL (ref 150–400)
RBC: 4.46 MIL/uL (ref 3.87–5.11)
RDW: 15 % (ref 11.5–15.5)
WBC: 10.6 10*3/uL — AB (ref 4.0–10.5)

## 2014-12-16 MED ORDER — ONDANSETRON HCL 4 MG PO TABS: 4.0000 mg | ORAL_TABLET | Freq: Four times a day (QID) | ORAL | Status: DC

## 2014-12-16 NOTE — Discharge Instructions (Signed)
Morning Sickness °Morning sickness is when you feel sick to your stomach (nauseous) during pregnancy. You may feel sick to your stomach and throw up (vomit). You may feel sick in the morning, but you can feel this way any time of day. Some women feel very sick to their stomach and cannot stop throwing up (hyperemesis gravidarum). °HOME CARE °· Only take medicines as told by your doctor. °· Take multivitamins as told by your doctor. Taking multivitamins before getting pregnant can stop or lessen the harshness of morning sickness. °· Eat dry toast or unsalted crackers before getting out of bed. °· Eat 5 to 6 small meals a day. °· Eat dry and bland foods like rice and baked potatoes. °· Do not drink liquids with meals. Drink between meals. °· Do not eat greasy, fatty, or spicy foods. °· Have someone cook for you if the smell of food causes you to feel sick or throw up. °· If you feel sick to your stomach after taking prenatal vitamins, take them at night or with a snack. °· Eat protein when you need a snack (nuts, yogurt, cheese). °· Eat unsweetened gelatins for dessert. °· Wear a bracelet used for sea sickness (acupressure wristband). °· Go to a doctor that puts thin needles into certain body points (acupuncture) to improve how you feel. °· Do not smoke. °· Use a humidifier to keep the air in your house free of odors. °· Get lots of fresh air. °GET HELP IF: °· You need medicine to feel better. °· You feel dizzy or lightheaded. °· You are losing weight. °GET HELP RIGHT AWAY IF:  °· You feel very sick to your stomach and cannot stop throwing up. °· You pass out (faint). °MAKE SURE YOU: °· Understand these instructions. °· Will watch your condition. °· Will get help right away if you are not doing well or get worse. °  °This information is not intended to replace advice given to you by your health care provider. Make sure you discuss any questions you have with your health care provider. °  °Document Released: 02/25/2004  Document Revised: 02/07/2014 Document Reviewed: 07/04/2012 °Elsevier Interactive Patient Education ©2016 Elsevier Inc[employer] °Eating Plan for Hyperemesis Gravidarum °Severe cases of hyperemesis gravidarum can lead to dehydration and malnutrition. The hyperemesis eating plan is one way to lessen the symptoms of nausea and vomiting. It is often used with prescribed medicines to control your symptoms.  °WHAT CAN I DO TO RELIEVE MY SYMPTOMS? °Listen to your body. Everyone is different and has different preferences. Find what works best for you. Some of the following things may help: °· Eat and drink slowly. °· Eat 5-6 small meals daily instead of 3 large meals.   °· Eat crackers before you get out of bed in the morning.   °· Starchy foods are usually well tolerated (such as cereal, toast, bread, potatoes, pasta, rice, and pretzels).   °· Ginger may help with nausea. Add ¼ tsp ground ginger to hot tea or choose ginger tea.   °· Try drinking 100% fruit juice or an electrolyte drink. °· Continue to take your prenatal vitamins as directed by your health care provider. If you are having trouble taking your prenatal vitamins, talk with your health care provider about different options. °· Include at least 1 serving of protein with your meals and snacks (such as meats or poultry, beans, nuts, eggs, or yogurt). Try eating a protein-rich snack before bed (such as cheese and crackers or a half turkey or peanut butter sandwich). °  WHAT THINGS SHOULD I AVOID TO REDUCE MY SYMPTOMS? The following things may help reduce your symptoms:  Avoid foods with strong smells. Try eating meals in well-ventilated areas that are free of odors.  Avoid drinking water or other beverages with meals. Try not to drink anything less than 30 minutes before and after meals.  Avoid drinking more than 1 cup of fluid at a time.  Avoid fried or high-fat foods, such as butter and cream sauces.  Avoid spicy foods.  Avoid skipping meals the best you can.  Nausea can be more intense on an empty stomach. If you cannot tolerate food at that time, do not force it. Try sucking on ice chips or other frozen items and make up the calories later.  Avoid lying down within 2 hours after eating.   This information is not intended to replace advice given to you by your health care provider. Make sure you discuss any questions you have with your health care provider.   Document Released: 11/14/2006 Document Revised: 01/22/2013 Document Reviewed: 11/21/2012 Elsevier Interactive Patient Education 2016 [place] for Gastroesophageal Reflux Disease, Adult When you have gastroesophageal reflux disease (GERD), the foods you eat and your eating habits are very important. Choosing the right foods can help ease your discomfort.  WHAT GUIDELINES DO I NEED TO FOLLOW?   Choose fruits, vegetables, whole grains, and low-fat dairy products.   Choose low-fat meat, fish, and poultry.  Limit fats such as oils, salad dressings, butter, nuts, and avocado.   Keep a food diary. This helps you identify foods that cause symptoms.   Avoid foods that cause symptoms. These may be different for everyone.   Eat small meals often instead of 3 large meals a day.   Eat your meals slowly, in a place where you are relaxed.   Limit fried foods.   Cook foods using methods other than frying.   Avoid drinking alcohol.   Avoid drinking large amounts of liquids with your meals.   Avoid bending over or lying down until 2-3 hours after eating.  WHAT FOODS ARE NOT RECOMMENDED?  These are some foods and drinks that may make your symptoms worse: Vegetables Tomatoes. Tomato juice. Tomato and spaghetti sauce. Chili peppers. Onion and garlic. Horseradish. Fruits Oranges, grapefruit, and lemon (fruit and juice). Meats High-fat meats, fish, and poultry. This includes hot dogs, ribs, ham, sausage, salami, and bacon. Dairy Whole milk and chocolate milk. Sour  cream. Cream. Butter. Ice cream. Cream cheese.  Drinks Coffee and tea. Bubbly (carbonated) drinks or energy drinks. Condiments Hot sauce. Barbecue sauce.  Sweets/Desserts Chocolate and cocoa. Donuts. Peppermint and spearmint. Fats and Oils High-fat foods. This includes [place] fries and potato chips. Other Vinegar. Strong spices. This includes black pepper, white pepper, red pepper, cayenne, curry powder, cloves, ginger, and chili powder. The items listed above may not be a complete list of foods and drinks to avoid. Contact your dietitian for more information.   This information is not intended to replace advice given to you by your health care provider. Make sure you discuss any questions you have with your health care provider.   Document Released: 07/19/2011 Document Revised: 02/07/2014 Document Reviewed: 11/21/2012 Elsevier Interactive Patient Education [employer].

## 2014-12-16 NOTE — MAU Provider Note (Signed)
History     CSN: [id number]  Arrival date and time: 12/16/14 1237   First Provider Initiated Contact with Patient 12/16/14 1533      Chief Complaint  Patient presents with  . Abdominal Pain   HPI  Ms. Catherine Osborne is a 39 y.o. [id number] at [redacted]w[redacted]d who presents to MAU today with complaint of nausea and vomiting. The patient states that she was seen here for this earlier in the pregnancy and treated with Pepcid and Phenergan which she is no longer taking. She doesn't feel that these work. She states that her symptoms had improved for > 1 month and returned this morning. She denies vomiting, but has had worsening nausea today and was unable to eat. She also states that she has been eating a lot recently, large heavy meals. She states associated mild upper abdominal pain. She denies lower abdominal pain or bleeding. She is concerned about the pregnancy due to multiple previous losses.   OB History    Gravida Para Term Preterm AB TAB SAB Ectopic Multiple Living   14 1  1 12 2 10   1       Past Medical History  Diagnosis Date  . Asthma   . Pneumonia   . Allergy     Past Surgical History  Procedure Laterality Date  . Dnc    . Foot surgery    . Dilation and evacuation N/A 05/22/2012    Procedure: DILATATION AND EVACUATION;  Surgeon: Frederico Hamman, MD;  Location: [facility];  Service: Gynecology;  Laterality: N/A;    Family History  Problem Relation Age of Onset  . Bone cancer Mother   . Breast cancer Maternal Grandmother   . Diabetes    . Hypertension    . Stroke    . Heart attack      Social History  Substance Use Topics  . Smoking status: Former Research scientist (life sciences)  . Smokeless tobacco: Not on file  . Alcohol Use: No    Allergies: No Known Allergies  No prescriptions prior to admission    Review of Systems  Constitutional: Negative for fever and malaise/fatigue.  Gastrointestinal: Positive for nausea, vomiting and abdominal pain. Negative for diarrhea and constipation.   Genitourinary: Negative for dysuria, urgency and frequency.       Neg - vaginal bleeding, discharge   Physical Exam   Blood pressure 102/60, pulse 85, temperature 98.6 F (37 C), temperature source Oral, resp. rate 18, last menstrual period 09/22/2014.  Physical Exam  Nursing note and vitals reviewed. Constitutional: She is oriented to person, place, and time. She appears well-developed and well-nourished. No distress.  HENT:  Head: Normocephalic and atraumatic.  Cardiovascular: Normal rate.   Respiratory: Effort normal.  GI: Soft. She exhibits no distension and no mass. There is no tenderness. There is no rebound and no guarding.  Neurological: She is alert and oriented to person, place, and time.  Skin: Skin is warm and dry. No erythema.  Psychiatric: She has a normal mood and affect.    Results for orders placed or performed during the hospital encounter of 12/16/14 (from the past 24 hour(s))  Urinalysis, Routine w reflex microscopic (not at [facility])     Status: Abnormal   Collection Time: 12/16/14  1:20 PM  Result Value Ref Range   Color, Urine YELLOW YELLOW   APPearance CLEAR CLEAR   Specific Gravity, Urine 1.015 1.005 - 1.030   pH 8.5 (H) 5.0 - 8.0   Glucose, UA NEGATIVE NEGATIVE  mg/dL   Hgb urine dipstick NEGATIVE NEGATIVE   Bilirubin Urine NEGATIVE NEGATIVE   Ketones, ur NEGATIVE NEGATIVE mg/dL   Protein, ur NEGATIVE NEGATIVE mg/dL   Nitrite NEGATIVE NEGATIVE   Leukocytes, UA NEGATIVE NEGATIVE  CBC     Status: Abnormal   Collection Time: 12/16/14  2:33 PM  Result Value Ref Range   WBC 10.6 (H) 4.0 - 10.5 K/uL   RBC 4.46 3.87 - 5.11 MIL/uL   Hemoglobin 13.0 12.0 - 15.0 g/dL   HCT 38.4 36.0 - 46.0 %   MCV 86.1 78.0 - 100.0 fL   MCH 29.1 26.0 - 34.0 pg   MCHC 33.9 30.0 - 36.0 g/dL   RDW 15.0 11.5 - 15.5 %   Platelets 260 150 - 400 K/uL  Comprehensive metabolic panel     Status: Abnormal   Collection Time: 12/16/14  2:33 PM  Result Value Ref Range   Sodium 133  (L) 135 - 145 mmol/L   Potassium 4.2 3.5 - 5.1 mmol/L   Chloride 101 101 - 111 mmol/L   CO2 26 22 - 32 mmol/L   Glucose, Bld 87 65 - 99 mg/dL   BUN 8 6 - 20 mg/dL   Creatinine, Ser 0.52 0.44 - 1.00 mg/dL   Calcium 9.2 8.9 - 10.3 mg/dL   Total Protein 7.0 6.5 - 8.1 g/dL   Albumin 3.4 (L) 3.5 - 5.0 g/dL   AST 33 15 - 41 U/L   ALT 33 14 - 54 U/L   Alkaline Phosphatase 42 38 - 126 U/L   Total Bilirubin 0.5 0.3 - 1.2 mg/dL   GFR calc non Af Amer >60 >60 mL/min   GFR calc Af Amer >60 >60 mL/min   Anion gap 6 5 - 15    MAU Course  Procedures None  MDM FHR - 159 bpm with doppler UA without evidecen of dehydration CBC and CMP today without evidence of significant abnormalities  Assessment and Plan  A: SIUP at [redacted]w[redacted]d Nausea and vomiting in pregnancy prior to [redacted] weeks gestation  P: Discharge home Rx for Zofran given to patient Second trimester precautions and diet for N/V in pregnancy discussed Patient advised to follow-up with Dr. Ruthann Cancer as planned for routine prenatal care Patient may return to MAU as needed or if her condition were to change or worsen  Luvenia Redden, PA-C  12/16/2014, 4:22 PM

## 2014-12-16 NOTE — MAU Note (Signed)
Pt C/O upper abd pain since early this a.m., felt some nausea this morning - didn't vomit, couldn't eat breakfast.  C/O constipation, which has been ongoing.   Pt feels like she is getting a cold.  States her pepcid makes her vomit more.

## 2014-12-29 LAB — PULMONARY FUNCTION TEST

## 2015-01-16 ENCOUNTER — Inpatient Hospital Stay (HOSPITAL_COMMUNITY)
Admission: AD | Admit: 2015-01-16 | Discharge: 2015-01-17 | Disposition: A | Discharge status: Home / Self Care | Payer: Medicaid Other | Source: Ambulatory Visit | Attending: Obstetrics | Admitting: Obstetrics

## 2015-01-16 DIAGNOSIS — B373 Candidiasis of vulva and vagina: Secondary | ICD-10-CM | POA: Insufficient documentation

## 2015-01-16 DIAGNOSIS — Z113 Encounter for screening for infections with a predominantly sexual mode of transmission: Secondary | ICD-10-CM

## 2015-01-16 DIAGNOSIS — B3731 Acute candidiasis of vulva and vagina: Secondary | ICD-10-CM

## 2015-01-16 DIAGNOSIS — O98812 Other maternal infectious and parasitic diseases complicating pregnancy, second trimester: Secondary | ICD-10-CM | POA: Insufficient documentation

## 2015-01-16 DIAGNOSIS — Z3A16 16 weeks gestation of pregnancy: Secondary | ICD-10-CM | POA: Insufficient documentation

## 2015-01-17 ENCOUNTER — Encounter (HOSPITAL_COMMUNITY): Payer: Self-pay | Admitting: *Deleted

## 2015-01-17 DIAGNOSIS — Z3A16 16 weeks gestation of pregnancy: Secondary | ICD-10-CM | POA: Diagnosis not present

## 2015-01-17 DIAGNOSIS — B373 Candidiasis of vulva and vagina: Secondary | ICD-10-CM | POA: Diagnosis not present

## 2015-01-17 DIAGNOSIS — Z113 Encounter for screening for infections with a predominantly sexual mode of transmission: Secondary | ICD-10-CM

## 2015-01-17 DIAGNOSIS — O98812 Other maternal infectious and parasitic diseases complicating pregnancy, second trimester: Secondary | ICD-10-CM | POA: Diagnosis not present

## 2015-01-17 DIAGNOSIS — R300 Dysuria: Secondary | ICD-10-CM | POA: Diagnosis present

## 2015-01-17 LAB — URINALYSIS, ROUTINE W REFLEX MICROSCOPIC
Bilirubin Urine: NEGATIVE
GLUCOSE, UA: NEGATIVE mg/dL
Ketones, ur: NEGATIVE mg/dL
Leukocytes, UA: NEGATIVE
Nitrite: NEGATIVE
PH: 6 (ref 5.0–8.0)
PROTEIN: NEGATIVE mg/dL
Specific Gravity, Urine: >1.03 — ABNORMAL HIGH (ref 1.005–1.030)

## 2015-01-17 LAB — URINE MICROSCOPIC-ADD ON: RBC / HPF: NONE SEEN RBC/hpf (ref 0–5)

## 2015-01-17 LAB — WET PREP, GENITAL
Clue Cells Wet Prep HPF POC: NONE SEEN
Sperm: NONE SEEN
TRICH WET PREP: NONE SEEN

## 2015-01-17 LAB — CBC
HEMATOCRIT: 35.3 % — AB (ref 36.0–46.0)
HEMOGLOBIN: 11.9 g/dL — AB (ref 12.0–15.0)
MCH: 29.3 pg (ref 26.0–34.0)
MCHC: 33.7 g/dL (ref 30.0–36.0)
MCV: 86.9 fL (ref 78.0–100.0)
Platelets: 258 10*3/uL (ref 150–400)
RBC: 4.06 MIL/uL (ref 3.87–5.11)
RDW: 15.1 % (ref 11.5–15.5)
WBC: 13.9 10*3/uL — AB (ref 4.0–10.5)

## 2015-01-17 LAB — HIV ANTIBODY (ROUTINE TESTING W REFLEX): HIV Screen 4th Generation wRfx: NONREACTIVE

## 2015-01-17 LAB — OB RESULTS CONSOLE GC/CHLAMYDIA: GC PROBE AMP, GENITAL: NEGATIVE

## 2015-01-17 LAB — RPR: RPR: NONREACTIVE

## 2015-01-17 MED ORDER — TERCONAZOLE 0.8 % VA CREA: 1.0000 | TOPICAL_CREAM | Freq: Every day | VAGINAL | Status: DC

## 2015-01-17 NOTE — Discharge Instructions (Signed)

## 2015-01-17 NOTE — MAU Note (Addendum)
Stomach hurting, painful burning urination and discharge for 4 days

## 2015-01-17 NOTE — MAU Provider Note (Signed)
History     CSN: [id number]  Arrival date and time: 01/16/15 2349   None         Chief Complaint  Patient presents with  . Abdominal Pain  . Urinary Frequency   HPI  Catherine Osborne is a 39 y.o. [id number] at [redacted]w[redacted]d who presents for dysuria & abdominal pain.  Reports burning with urination x 2 days. Unsure if it's burning from urethra or when urine touches skin. Denies fever, flank pain, frequency, or hematuria.  Also reports vaginal discharge for several weeks. Causes underwear to be wet. Can't tell what color it is but has noticed an odor with it. Recently changes sex partner 1 month ago and is requesting STD testing at this time.  Reports some lower abdominal pain today that she states is d/t eating spicy pizza for lunch.  Denies n/v/d. Some constipation that she has had throughout pregnancy. Last BM today.  Denies vaginal bleeding.   OB History    Gravida Para Term Preterm AB TAB SAB Ectopic Multiple Living   14 1  1 12 2 10   1       Past Medical History  Diagnosis Date  . Asthma   . Pneumonia   . Allergy     Past Surgical History  Procedure Laterality Date  . Dnc    . Foot surgery    . Dilation and evacuation N/A 05/22/2012    Procedure: DILATATION AND EVACUATION;  Surgeon: Frederico Hamman, MD;  Location: [facility];  Service: Gynecology;  Laterality: N/A;    Family History  Problem Relation Age of Onset  . Bone cancer Mother   . Breast cancer Maternal Grandmother   . Diabetes    . Hypertension    . Stroke    . Heart attack      Social History  Substance Use Topics  . Smoking status: Former Research scientist (life sciences)  . Smokeless tobacco: None  . Alcohol Use: No    Allergies: No Known Allergies  Prescriptions prior to admission  Medication Sig Dispense Refill Last Dose  . albuterol (PROVENTIL HFA;VENTOLIN HFA) 108 (90 BASE) MCG/ACT inhaler Inhale 2 puffs into the lungs every 6 (six) hours as needed for wheezing or shortness of breath.    Taking  . beclomethasone  (QVAR) 40 MCG/ACT inhaler Inhale 2 puffs into the lungs 2 (two) times daily.    Taking  . EPIPEN 2-PAK 0.3 MG/0.3ML SOAJ injection INJECT INTO MUSCLE AS NEEDED AS DIRECTED FOR ALLERGIC REACTION  0 Taking  . famotidine (PEPCID) 20 MG tablet Take 1 tablet (20 mg total) by mouth 2 (two) times daily. 60 tablet 0 Taking  . ondansetron (ZOFRAN) 4 MG tablet Take 1 tablet (4 mg total) by mouth every 6 (six) hours. 12 tablet 0   . Prenatal Vit-Fe Fumarate-FA (PRENATAL MULTIVITAMIN) TABS tablet Take 1 tablet by mouth daily at 12 noon.   Taking    Review of Systems  Constitutional: Negative.   Gastrointestinal: Positive for abdominal pain and constipation. Negative for nausea, vomiting and diarrhea.  Genitourinary: Positive for dysuria. Negative for frequency, hematuria and flank pain.       + vaginal discharge No vaginal bleeding   Physical Exam   Blood pressure 101/67, pulse 73, temperature 98 F (36.7 C), temperature source Oral, resp. rate 18, height 5\' 2"  (1.575 m), weight 149 lb (67.586 kg), last menstrual period 09/27/2014, unknown if currently breastfeeding.  Physical Exam  Nursing note and vitals reviewed. Constitutional: She is oriented to person,  place, and time. She appears well-developed and well-nourished. No distress.  HENT:  Head: Normocephalic and atraumatic.  Eyes: Conjunctivae are normal. Right eye exhibits no discharge. Left eye exhibits no discharge. No scleral icterus.  Neck: Normal range of motion.  Cardiovascular: Normal rate, regular rhythm and normal heart sounds.   No murmur heard. Respiratory: Effort normal and breath sounds normal. No respiratory distress. She has no wheezes.  GI: Soft. Bowel sounds are normal. There is no tenderness. There is no rebound and no CVA tenderness.  Genitourinary: Uterus is enlarged (appropriate for gestation). Cervix exhibits no motion tenderness and no friability.  Cervix closed Small amount of white plaques adherent to vaginal walls.  Moderate amount of yellow mucoid discharge coming from cervical os.    Neurological: She is alert and oriented to person, place, and time.  Skin: Skin is warm and dry. She is not diaphoretic.  Psychiatric: She has a normal mood and affect. Her behavior is normal. Judgment and thought content normal.    MAU Course  Procedures Results for orders placed or performed during the hospital encounter of 01/16/15 (from the past 24 hour(s))  Urinalysis, Routine w reflex microscopic (not at [facility])     Status: Abnormal   Collection Time: 01/17/15 12:03 AM  Result Value Ref Range   Color, Urine YELLOW YELLOW   APPearance CLEAR CLEAR   Specific Gravity, Urine >1.030 (H) 1.005 - 1.030   pH 6.0 5.0 - 8.0   Glucose, UA NEGATIVE NEGATIVE mg/dL   Hgb urine dipstick TRACE (A) NEGATIVE   Bilirubin Urine NEGATIVE NEGATIVE   Ketones, ur NEGATIVE NEGATIVE mg/dL   Protein, ur NEGATIVE NEGATIVE mg/dL   Nitrite NEGATIVE NEGATIVE   Leukocytes, UA NEGATIVE NEGATIVE  Urine microscopic-add on     Status: Abnormal   Collection Time: 01/17/15 12:03 AM  Result Value Ref Range   Squamous Epithelial / LPF 0-5 (A) NONE SEEN   WBC, UA 0-5 0 - 5 WBC/hpf   RBC / HPF NONE SEEN 0 - 5 RBC/hpf   Bacteria, UA RARE (A) NONE SEEN   Urine-Other MUCOUS PRESENT   CBC     Status: Abnormal   Collection Time: 01/17/15  1:25 AM  Result Value Ref Range   WBC 13.9 (H) 4.0 - 10.5 K/uL   RBC 4.06 3.87 - 5.11 MIL/uL   Hemoglobin 11.9 (L) 12.0 - 15.0 g/dL   HCT 35.3 (L) 36.0 - 46.0 %   MCV 86.9 78.0 - 100.0 fL   MCH 29.3 26.0 - 34.0 pg   MCHC 33.7 30.0 - 36.0 g/dL   RDW 15.1 11.5 - 15.5 %   Platelets 258 150 - 400 K/uL  Wet prep, genital     Status: Abnormal   Collection Time: 01/17/15  1:30 AM  Result Value Ref Range   Yeast Wet Prep HPF POC PRESENT (A) NONE SEEN   Trich, Wet Prep NONE SEEN NONE SEEN   Clue Cells Wet Prep HPF POC NONE SEEN NONE SEEN   WBC, Wet Prep HPF POC MANY (A) NONE SEEN   Sperm NONE SEEN     MDM FHT  156 by doppler STD testing per patient request Wet prep shows yeast - will send rx  Urine culture sent Assessment and Plan  A: 1. Vaginal yeast infection   2. Screen for STD (sexually transmitted disease)      P: Discharge home GC/CT, HIV, RPR pending Keep scheduled prenatal appt Rx terazol Urine culture pending  Jorje Guild, NP  01/17/2015, 12:44 AM

## 2015-01-18 LAB — CULTURE, OB URINE

## 2015-01-19 LAB — GC/CHLAMYDIA PROBE AMP (~~LOC~~) NOT AT ARMC
CHLAMYDIA, DNA PROBE: NEGATIVE
Neisseria Gonorrhea: NEGATIVE

## 2015-01-27 ENCOUNTER — Other Ambulatory Visit (HOSPITAL_COMMUNITY): Payer: Self-pay | Admitting: Obstetrics

## 2015-01-27 DIAGNOSIS — Z3689 Encounter for other specified antenatal screening: Secondary | ICD-10-CM

## 2015-01-27 DIAGNOSIS — O09522 Supervision of elderly multigravida, second trimester: Secondary | ICD-10-CM

## 2015-02-01 NOTE — L&D Delivery Note (Signed)
Delivery Note This is a 40 year old G 31 P1 who was admitted for Not in labor. and IOL for preeclampsia. She progressed with cytotec, foley bulb, pitocin and epidural to the second stage of labor.  She pushed for 15 min.  At 11:34 AM she delivered a viable infant female, cephalic, over an intact perineum.  A nuchal cord   was not identified. Infant placed on maternal abdomen.  Delayed cord clamping was performed for 15 minutes.  Cord double clamped and cut.  Apgar scores were 9 and 9. The placenta delivered spontaneously, shultz, with a 3 vessel cord.  Inspection revealed none. The uterus was firm bleeding stable.   EBL was 350.  Cytotec PR and Methergine IM given for prolonged induction prophylaxis. Placenta was sent.  There were no complications during the procedure.  Mom and baby skin to skin following delivery. Left in stable condition.  female was delivered via Vaginal, Spontaneous Delivery (Presentation: ; Occiput Anterior).  APGAR: 9, 9; weight 7 lb 0.7 oz (3196 g).   Placenta status: Intact, Spontaneous.  Cord: 3 vessels with the following complications: None.  Cord pH: N/A  Anesthesia: Epidural  Episiotomy: None Lacerations: None Suture Repair: none Est. Blood Loss (mL): 350  Mom to Ante for Mag drip d/t preeclampsia.  Baby to Couplet care / Skin to Skin.  [name], CNM 06/12/2015, 1:27 PM

## 2015-02-10 ENCOUNTER — Other Ambulatory Visit (HOSPITAL_COMMUNITY): Payer: Self-pay | Admitting: Obstetrics

## 2015-02-10 ENCOUNTER — Encounter (HOSPITAL_COMMUNITY): Payer: Self-pay

## 2015-02-10 ENCOUNTER — Ambulatory Visit (HOSPITAL_COMMUNITY)
Admission: RE | Admit: 2015-02-10 | Discharge: 2015-02-10 | Disposition: A | Discharge status: Home / Self Care | Payer: Medicaid Other | Source: Ambulatory Visit | Attending: Obstetrics | Admitting: Obstetrics

## 2015-02-10 DIAGNOSIS — O99322 Drug use complicating pregnancy, second trimester: Secondary | ICD-10-CM | POA: Diagnosis not present

## 2015-02-10 DIAGNOSIS — O99519 Diseases of the respiratory system complicating pregnancy, unspecified trimester: Secondary | ICD-10-CM

## 2015-02-10 DIAGNOSIS — Z315 Encounter for genetic counseling: Secondary | ICD-10-CM | POA: Diagnosis not present

## 2015-02-10 DIAGNOSIS — O09522 Supervision of elderly multigravida, second trimester: Secondary | ICD-10-CM | POA: Insufficient documentation

## 2015-02-10 DIAGNOSIS — J45909 Unspecified asthma, uncomplicated: Secondary | ICD-10-CM

## 2015-02-10 DIAGNOSIS — Z3A20 20 weeks gestation of pregnancy: Secondary | ICD-10-CM | POA: Diagnosis not present

## 2015-02-10 DIAGNOSIS — Z3689 Encounter for other specified antenatal screening: Secondary | ICD-10-CM

## 2015-02-10 DIAGNOSIS — O09529 Supervision of elderly multigravida, unspecified trimester: Secondary | ICD-10-CM

## 2015-02-10 DIAGNOSIS — Z36 Encounter for antenatal screening of mother: Secondary | ICD-10-CM | POA: Diagnosis not present

## 2015-02-12 DIAGNOSIS — O09529 Supervision of elderly multigravida, unspecified trimester: Secondary | ICD-10-CM | POA: Insufficient documentation

## 2015-02-12 NOTE — Progress Notes (Signed)
Genetic Counseling  High-Risk Gestation Note  Appointment Date:  02/10/15 Referred By: Frederico Hamman, MD Date of Birth:  05-27-1975   Pregnancy History: [id number] Estimated Date of Delivery: 06/30/15 Estimated Gestational Age: [redacted]w[redacted]d Attending: Renella Cunas, MD   Ms. DERNA DITTMER was seen for genetic counseling because of a maternal age of 40 y.o..     In Summary:  1 in 18 risk for fetal aneuploidy related to maternal age of 40 y.o.   Detailed ultrasound performed today and within normal limits  Patient elected to pursue NIPS (Panorama) today  Declined amniocentesis  Patient reported she has sickle cell trait; father of pregnancy's status unknown.   Discussed autosomal recessive inheritance and increased risk if father of the pregnancy is a carrier  Patient would not be interested in prenatal diagnosis (amniocentesis) for sickle cell. She understands that sickle cell is assessed on newborn screening.   She was counseled regarding maternal age and the association with risk for chromosome conditions due to nondisjunction with aging of the ova.   We reviewed chromosomes, nondisjunction, and the associated 1 in 37 risk for fetal aneuploidy related to a maternal age of 40 y.o. at [redacted]w[redacted]d gestation.  She was counseled that the risk for aneuploidy decreases as gestational age increases, accounting for those pregnancies which spontaneously abort.  We specifically discussed Down syndrome (trisomy 66), trisomies 54 and 27, and sex chromosome aneuploidies (47,XXX and 47,XXY) including the common features and prognoses of each.   We reviewed available screening options including Quad screen, noninvasive prenatal screening (NIPS)/cell free DNA (cfDNA) testing, and detailed ultrasound.  She was counseled that screening tests are used to modify a patient's a priori risk for aneuploidy, typically based on age. This estimate provides a pregnancy specific risk assessment. We reviewed the  benefits and limitations of each option. Specifically, we discussed the conditions for which each test screens, the detection rates, and false positive rates of each. She was also counseled regarding diagnostic testing via amniocentesis. We reviewed the approximate 1 in [id number] risk for complications for amniocentesis, including spontaneous pregnancy loss. After consideration of all the options, she elected to proceed with NIPS (Panorama through [facility] laboratory).  Those results will be available in 8-10 days.    A detailed ultrasound was performed today. The ultrasound report will be sent under separate cover. There were no visualized fetal anomalies or markers suggestive of aneuploidy. Diagnostic testing was declined today.  She understands that screening tests cannot rule out all birth defects or genetic syndromes. The patient was advised of this limitation and states she still does not want additional testing at this time.   Ms. Booz was provided with written information regarding sickle cell anemia (SCA) including the carrier frequency and incidence in the African-American population, the availability of carrier testing and prenatal diagnosis if indicated.  In addition, we discussed that hemoglobinopathies are routinely screened for as part of the [facility] newborn screening panel. She reported that she has sickle cell trait. Records were not available to [place] at the time of today's visit to confirm this report.  We discussed that sickle cell anemia (SCA) affects the shape and function of the red blood cell by producing abnormal hemoglobin. They were counseled that hemoglobin is a protein in the RBCs that carries oxygen to the body's organs. Individuals who have SCA have changes within the genes that codes for hemoglobin. This couple was counseled that SCA is inherited in an autosomal recessive manner, and occurs when both copies of  the hemoglobin gene are changed and produce an abnormal hemoglobin S. Typically,  one abnormal gene for the production of hemoglobin S is inherited from each parent. A carrier of SCA has one altered copy of the gene for hemoglobin and one typical working copy, referred to as sickle cell trait. Carriers of recessive conditions typically do not have symptoms related to the condition because they still have one functioning copy of the gene, and thus some production of the typical protein coded for by that gene.  Given the recessive inheritance, we discussed the importance of understanding the carrier status in order to accurately predict the risk of a hemoglobinopathy in the fetus. The patient did not have information regarding the father of the pregnancy's status. There is a second potential father of the pregnancy, and the patient does not have information regarding his history. We reviewed that the general population chance to be a carrier of sickle cell trait is up to approximately 1 in 12. Thus, prior to having additional information, the risk for sickle cell anemia could be approximately 1 in 48. Hemoglobin electrophoresis is available to the father of the pregnancy to determine whether he has any hemoglobin variant, including sickle cell trait. If both parents are identified to be carriers, prenatal diagnosis would be available via amniocentesis. The patient stated she would not be interested in prenatal diagnosis via amniocentesis for sickle cell anemia. We also reviewed the availability of newborn screening in [place] for hemoglobinopathies.   Both family histories were reviewed and found to be otherwise contributory for congenital deafness for the patient's half-nephew (her maternal half-brother's son). Hearing loss can have many causes including genetic factors, environmental factors or a combination of both.  Sometimes hearing loss can occur as one feature of an underlying genetic condition or may be caused by a single nonworking gene. We reviewed that there are many inherited  forms of deafness including autosomal recessive, autosomal dominant, and X-linked. Given the reported family history and degree of relation, recurrence risk for the current pregnancy is likely low. Additional information regarding a cause for their hearing loss is needed in order to most accurately assess the chance for relatives. We reviewed that hearing is assessed as part of newborn screening in [place].    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 She reported no underlying cause has been determined. Approximately 1 in 6 confirmed pregnancies results in miscarriage. A single underlying cause is more likely to be suspected when a couple has experienced 3 or more losses. It is less likely that there will be an identifiable single underlying cause when a couple has experienced less than 3 losses. Several possible causes can include chromosome rearrangements, antibodies, thrombophilia, maternal health conditions, and uterine structural differences. The patient may contact us if she is interested in additional testing to assess for possible underlying causes for recurrent miscarriage.   The potential father of the pregnancy has one previous son described to be a slower Landscape architect. He is currently 39 years old and receives additional help. The patient had limited information regarding the individual and whether or not an underlying cause is known. We discussed that there can be many causes for learning disabilities including genetic, sporadic, environmental, and multifactorial. Additional information is needed to assess recurrence risk for relatives. Without an identified cause, prenatal screening or testing would not be informative regarding this family history.   Ms. Livshits was not familiar with additional family history for the father of the baby.  We,  therefore, cannot comment on how his history might contribute to the overall chance for the baby to have a birth defect.  Without further  information regarding the provided family history, an accurate genetic risk cannot be calculated. Further genetic counseling is warranted if more information is obtained.  Ms. JAYCEE LUAN denied exposure to environmental toxins or chemical agents. She denied the use of alcohol, tobacco or street drugs. She denied significant viral illnesses during the course of her pregnancy. Her medical and surgical histories were contributory for previous miscarriages.   I counseled Ms. [name] regarding the above risks and available options.  The approximate face-to-face time with the genetic counselor was 40 minutes.  Chipper Oman, MS,  Certified Genetic Counselor 02/12/2015

## 2015-02-13 ENCOUNTER — Encounter (HOSPITAL_COMMUNITY): Payer: Self-pay | Admitting: *Deleted

## 2015-02-13 ENCOUNTER — Inpatient Hospital Stay (HOSPITAL_COMMUNITY): Payer: Medicaid Other

## 2015-02-13 ENCOUNTER — Inpatient Hospital Stay (HOSPITAL_COMMUNITY)
Admission: AD | Admit: 2015-02-13 | Discharge: 2015-02-13 | Disposition: A | Discharge status: Home / Self Care | Payer: Medicaid Other | Source: Ambulatory Visit | Attending: Obstetrics | Admitting: Obstetrics

## 2015-02-13 DIAGNOSIS — O26879 Cervical shortening, unspecified trimester: Secondary | ICD-10-CM

## 2015-02-13 DIAGNOSIS — R109 Unspecified abdominal pain: Secondary | ICD-10-CM | POA: Diagnosis present

## 2015-02-13 DIAGNOSIS — O26892 Other specified pregnancy related conditions, second trimester: Secondary | ICD-10-CM | POA: Diagnosis not present

## 2015-02-13 DIAGNOSIS — O9989 Other specified diseases and conditions complicating pregnancy, childbirth and the puerperium: Secondary | ICD-10-CM

## 2015-02-13 DIAGNOSIS — Z3A20 20 weeks gestation of pregnancy: Secondary | ICD-10-CM | POA: Insufficient documentation

## 2015-02-13 DIAGNOSIS — Z87891 Personal history of nicotine dependence: Secondary | ICD-10-CM | POA: Insufficient documentation

## 2015-02-13 DIAGNOSIS — O26899 Other specified pregnancy related conditions, unspecified trimester: Secondary | ICD-10-CM

## 2015-02-13 DIAGNOSIS — R1031 Right lower quadrant pain: Secondary | ICD-10-CM | POA: Insufficient documentation

## 2015-02-13 LAB — URINALYSIS, ROUTINE W REFLEX MICROSCOPIC
Bilirubin Urine: NEGATIVE
GLUCOSE, UA: NEGATIVE mg/dL
Hgb urine dipstick: NEGATIVE
Ketones, ur: NEGATIVE mg/dL
LEUKOCYTES UA: NEGATIVE
NITRITE: NEGATIVE
PROTEIN: NEGATIVE mg/dL
Specific Gravity, Urine: >1.03 — ABNORMAL HIGH (ref 1.005–1.030)
pH: 6 (ref 5.0–8.0)

## 2015-02-13 LAB — CBC
HEMATOCRIT: 35.7 % — AB (ref 36.0–46.0)
HEMOGLOBIN: 11.9 g/dL — AB (ref 12.0–15.0)
MCH: 29.1 pg (ref 26.0–34.0)
MCHC: 33.3 g/dL (ref 30.0–36.0)
MCV: 87.3 fL (ref 78.0–100.0)
Platelets: 261 10*3/uL (ref 150–400)
RBC: 4.09 MIL/uL (ref 3.87–5.11)
RDW: 14.5 % (ref 11.5–15.5)
WBC: 9 10*3/uL (ref 4.0–10.5)

## 2015-02-13 MED ORDER — OXYCODONE-ACETAMINOPHEN 5-325 MG PO TABS
2.0000 | ORAL_TABLET | Freq: Once | ORAL | Status: AC
  Administered 2015-02-13: 2 via ORAL
  Filled 2015-02-13: qty 2

## 2015-02-13 MED ORDER — LACTATED RINGERS IV BOLUS (SEPSIS)
1000.0000 mL | Freq: Once | INTRAVENOUS | Status: AC
  Administered 2015-02-13: 1000 mL via INTRAVENOUS

## 2015-02-13 MED ORDER — TERBUTALINE SULFATE 1 MG/ML IJ SOLN
0.2500 mg | Freq: Once | INTRAMUSCULAR | Status: AC
  Administered 2015-02-13: 0.25 mg via SUBCUTANEOUS
  Filled 2015-02-13: qty 1

## 2015-02-13 NOTE — Progress Notes (Signed)
Patient states her pain is still a "10". On arrival patient was crying and moaning. Now patient is calm, talking with  Visitors, does not give the appearance of pain at 10.

## 2015-02-13 NOTE — MAU Note (Signed)
States pain is constant, but varies in intensity.

## 2015-02-13 NOTE — Discharge Instructions (Signed)
Preterm Labor Information Preterm labor is when labor starts before you are [redacted] weeks pregnant. The normal length of pregnancy is 39 to 41 weeks.  CAUSES  The cause of preterm labor is not often known. The most common known cause is infection. RISK FACTORS  Having a history of preterm labor.  Having your water break before it should.  Having a placenta that covers the opening of the cervix.  Having a placenta that breaks away from the uterus.  Having a cervix that is too weak to hold the baby in the uterus.  Having too much fluid in the amniotic sac.  Taking drugs or smoking while pregnant.  Not gaining enough weight while pregnant.  Being younger than 91 and older than 39 years old.  Having a low income.  Being African American. SYMPTOMS  Period-like cramps, belly (abdominal) pain, or back pain.  Contractions that are regular, as often as six in an hour. They may be mild or painful.  Contractions that start at the top of the belly. They then move to the lower belly and back.  Lower belly pressure that seems to get stronger.  Bleeding from the vagina.  Fluid leaking from the vagina. TREATMENT  Treatment depends on:  Your condition.  The condition of your baby.  How many weeks pregnant you are. Your doctor may have you:  Take medicine to stop contractions.  Stay in bed except to use the restroom (bed rest).  Stay in the hospital. WHAT SHOULD YOU DO IF YOU THINK YOU ARE IN PRETERM LABOR? Call your doctor right away. You need to go to the hospital right away.  HOW CAN YOU PREVENT PRETERM LABOR IN FUTURE PREGNANCIES?  Stop smoking, if you smoke.  Maintain healthy weight gain.  Do not take drugs or be around chemicals that are not needed.  Tell your doctor if you think you have an infection.  Tell your doctor if you had a preterm labor before.   This information is not intended to replace advice given to you by your health care provider. Make sure you  discuss any questions you have with your health care provider.   Document Released: 04/15/2008 Document Revised: 06/03/2014 Document Reviewed: 02/20/2012 Elsevier Interactive Patient Education 2016 [employer].   Pelvic Rest Pelvic rest is sometimes recommended for women when:   The placenta is partially or completely covering the opening of the cervix (placenta previa).  There is bleeding between the uterine wall and the amniotic sac in the first trimester (subchorionic hemorrhage).  The cervix begins to open without labor starting (incompetent cervix, cervical insufficiency).  The labor is too early (preterm labor). HOME CARE INSTRUCTIONS  Do not have sexual intercourse, stimulation, or an orgasm.  Do not use tampons, douche, or put anything in the vagina.  Do not lift anything over 10 pounds (4.5 kg).  Avoid strenuous activity or straining your pelvic muscles. SEEK MEDICAL CARE IF:  You have any vaginal bleeding during pregnancy. Treat this as a potential emergency.  You have cramping pain felt low in the stomach (stronger than menstrual cramps).  You notice vaginal discharge (watery, mucus, or bloody).  You have a low, dull backache.  There are regular contractions or uterine tightening. SEEK IMMEDIATE MEDICAL CARE IF: You have vaginal bleeding and have placenta previa.    This information is not intended to replace advice given to you by your health care provider. Make sure you discuss any questions you have with your health care provider.  Document Released: 05/14/2010 Document Revised: 04/11/2011 Document Reviewed: 07/21/2014 Elsevier Interactive Patient Education [employer].

## 2015-02-13 NOTE — MAU Note (Signed)
Patient presents having right side lower abdominal pain which is sharp onset 12:00 pm, rate 10/10, denies vaginal bleeding, had [place] on Tuesday.

## 2015-02-13 NOTE — MAU Provider Note (Signed)
History     CSN: [id number]  Arrival date and time: 02/13/15 1617   First Provider Initiated Contact with Patient 02/13/15 1654         Chief Complaint  Patient presents with  . Abdominal Pain   HPI  Catherine Osborne is a 40 y.o. [id number] at [redacted]w[redacted]d who presents with abdominal pain.  Reports RLQ pain that is sharp & comes & goes. Feels pain every 5 minutes since noon today. Rates pain 10/10. No treatment.  Denies vaginal bleeding, LOF, discharge, n/v/d, constipation, or urinary complaints.  Last intercourse was Monday.    OB History    Gravida Para Term Preterm AB TAB SAB Ectopic Multiple Living   14 1  1 12 2 10   0 1      Past Medical History  Diagnosis Date  . Asthma   . Pneumonia   . Allergy     Past Surgical History  Procedure Laterality Date  . Dnc    . Foot surgery    . Dilation and evacuation N/A 05/22/2012    Procedure: DILATATION AND EVACUATION;  Surgeon: Frederico Hamman, MD;  Location: [facility];  Service: Gynecology;  Laterality: N/A;    Family History  Problem Relation Age of Onset  . Bone cancer Mother   . Breast cancer Maternal Grandmother   . Diabetes    . Hypertension    . Stroke    . Heart attack      Social History  Substance Use Topics  . Smoking status: Former Research scientist (life sciences)  . Smokeless tobacco: None  . Alcohol Use: No    Allergies: No Known Allergies  Prescriptions prior to admission  Medication Sig Dispense Refill Last Dose  . EPIPEN 2-PAK 0.3 MG/0.3ML SOAJ injection As needed  0 Not Taking  . Prenatal Vit-Fe Fumarate-FA (PRENATAL MULTIVITAMIN) TABS tablet Take 1 tablet by mouth daily at 12 noon.   02/12/2015 at Unknown time  . valACYclovir (VALTREX) 1000 MG tablet Take 500 mg by mouth daily.  0 02/12/2015 at Unknown time  . famotidine (PEPCID) 20 MG tablet Take 1 tablet (20 mg total) by mouth 2 (two) times daily. (Patient not taking: Reported on 02/10/2015) 60 tablet 0 Not Taking  . ondansetron (ZOFRAN) 4 MG tablet Take 1 tablet (4 mg  total) by mouth every 6 (six) hours. (Patient not taking: Reported on 02/10/2015) 12 tablet 0 Not Taking  . terconazole (TERAZOL 3) 0.8 % vaginal cream Place 1 applicator vaginally at bedtime. (Patient not taking: Reported on 02/10/2015) 20 g 0 Not Taking    Review of Systems  Constitutional: Negative.   Gastrointestinal: Positive for abdominal pain. Negative for nausea, vomiting, diarrhea and constipation.  Genitourinary: Negative.    Physical Exam   Blood pressure 120/62, pulse 80, temperature 98.7 F (37.1 C), temperature source Oral, resp. rate 18, height 5\' 2"  (1.575 m), weight 163 lb (73.936 kg), last menstrual period 09/27/2014, unknown if currently breastfeeding.  Physical Exam  Nursing note and vitals reviewed. Constitutional: She is oriented to person, place, and time. She appears well-developed and well-nourished. She appears distressed.  HENT:  Head: Normocephalic and atraumatic.  Eyes: Conjunctivae are normal. Right eye exhibits no discharge. Left eye exhibits no discharge. No scleral icterus.  Neck: Normal range of motion.  Cardiovascular: Normal rate, regular rhythm and normal heart sounds.   No murmur heard. Respiratory: Effort normal and breath sounds normal. No respiratory distress. She has no wheezes.  GI: Soft. Bowel sounds are normal. There is  tenderness in the right lower quadrant. There is no rebound and no guarding.  Neurological: She is alert and oriented to person, place, and time.  Skin: Skin is warm and dry. She is not diaphoretic.  Psychiatric: She has a normal mood and affect. Her behavior is normal. Judgment and thought content normal.   Dilation: Closed Station: -3 Exam by:: Robyne Askew, NP  MAU Course  Procedures Results for orders placed or performed during the hospital encounter of 02/13/15 (from the past 24 hour(s))  Urinalysis, Routine w reflex microscopic (not at [facility])     Status: Abnormal   Collection Time: 02/13/15  4:50 PM  Result Value Ref  Range   Color, Urine YELLOW YELLOW   APPearance CLEAR CLEAR   Specific Gravity, Urine >1.030 (H) 1.005 - 1.030   pH 6.0 5.0 - 8.0   Glucose, UA NEGATIVE NEGATIVE mg/dL   Hgb urine dipstick NEGATIVE NEGATIVE   Bilirubin Urine NEGATIVE NEGATIVE   Ketones, ur NEGATIVE NEGATIVE mg/dL   Protein, ur NEGATIVE NEGATIVE mg/dL   Nitrite NEGATIVE NEGATIVE   Leukocytes, UA NEGATIVE NEGATIVE  CBC     Status: Abnormal   Collection Time: 02/13/15  5:06 PM  Result Value Ref Range   WBC 9.0 4.0 - 10.5 K/uL   RBC 4.09 3.87 - 5.11 MIL/uL   Hemoglobin 11.9 (L) 12.0 - 15.0 g/dL   HCT 35.7 (L) 36.0 - 46.0 %   MCV 87.3 78.0 - 100.0 fL   MCH 29.1 26.0 - 34.0 pg   MCHC 33.3 30.0 - 36.0 g/dL   RDW 14.5 11.5 - 15.5 %   Platelets 261 150 - 400 K/uL    MDM FHT 155 by doppler Percocet & IV fluid bolus Patient appears in less pain that prior to going to ultrasound Ultrasound - cervical length of 2.4 which is 1.3 cm shorter than u/s 3 days ago 1907- S/w Dr. Ruthann Cancer about pt & ultrasound report. Give terbutaline. Can go home if improves.  Cervix remains closed.  Patient reports improvement in symptoms.  Assessment and Plan  A:  1. Abdominal pain in pregnancy   2. Cervix, short (affecting pregnancy)    P; Discharge home Pelvic rest PTL precautions Increase water intake Keep f/u with Dr. Gayla Doss, NP  02/13/2015, 4:54 PM

## 2015-02-19 ENCOUNTER — Telehealth (HOSPITAL_COMMUNITY): Payer: Self-pay | Admitting: MS"

## 2015-02-19 NOTE — Telephone Encounter (Signed)
Called Catherine Osborne to discuss her prenatal cell free DNA test results.  Ms. Catherine Osborne had Panorama testing through [facility].  Testing was offered because of advanced maternal age.   The patient was identified by name and DOB.  We reviewed that these are within normal limits, showing a less than 1 in 10,000 risk for trisomies 21, 18 and 13, and monosomy X (Turner syndrome).  In addition, the risk for triploidy/vanishing twin and sex chromosome trisomies (47,XXX and 47,XXY) was also low risk.  We reviewed that this testing identifies > 99% of pregnancies with trisomy 3, trisomy 49, sex chromosome trisomies (47,XXX and 47,XXY), and triploidy. The detection rate for trisomy 18 is 96%.  The detection rate for monosomy X is ~92%.  The false positive rate is <0.1% for all conditions. Testing was also consistent with female fetal sex.  She understands that this testing does not identify all genetic conditions.  All questions were answered to her satisfaction, she was encouraged to call with additional questions or concerns.  Chipper Oman, MS Certified Genetic Counselor 02/19/2015 3:11 PM

## 2015-02-19 NOTE — Telephone Encounter (Signed)
Attempted to contact patient regarding prenatal cell free DNA testing within normal range. Left message for patient to return call.   Catherine Osborne 02/19/2015 2:31 PM

## 2015-02-24 ENCOUNTER — Inpatient Hospital Stay (HOSPITAL_COMMUNITY)
Admission: AD | Admit: 2015-02-24 | Discharge: 2015-02-24 | Disposition: A | Discharge status: Home / Self Care | Payer: Medicaid Other | Source: Ambulatory Visit | Attending: Obstetrics | Admitting: Obstetrics

## 2015-02-24 ENCOUNTER — Encounter (HOSPITAL_COMMUNITY): Payer: Self-pay

## 2015-02-24 ENCOUNTER — Other Ambulatory Visit (HOSPITAL_COMMUNITY): Payer: Self-pay | Admitting: MS"

## 2015-02-24 ENCOUNTER — Inpatient Hospital Stay (HOSPITAL_COMMUNITY): Payer: Medicaid Other

## 2015-02-24 DIAGNOSIS — K5900 Constipation, unspecified: Secondary | ICD-10-CM | POA: Insufficient documentation

## 2015-02-24 DIAGNOSIS — Z87891 Personal history of nicotine dependence: Secondary | ICD-10-CM | POA: Diagnosis not present

## 2015-02-24 DIAGNOSIS — O99612 Diseases of the digestive system complicating pregnancy, second trimester: Secondary | ICD-10-CM | POA: Diagnosis not present

## 2015-02-24 DIAGNOSIS — Z3A22 22 weeks gestation of pregnancy: Secondary | ICD-10-CM | POA: Diagnosis not present

## 2015-02-24 DIAGNOSIS — O26879 Cervical shortening, unspecified trimester: Secondary | ICD-10-CM

## 2015-02-24 DIAGNOSIS — O26872 Cervical shortening, second trimester: Secondary | ICD-10-CM | POA: Diagnosis not present

## 2015-02-24 DIAGNOSIS — R110 Nausea: Secondary | ICD-10-CM | POA: Diagnosis present

## 2015-02-24 LAB — URINALYSIS, ROUTINE W REFLEX MICROSCOPIC
BILIRUBIN URINE: NEGATIVE
GLUCOSE, UA: NEGATIVE mg/dL
HGB URINE DIPSTICK: NEGATIVE
Ketones, ur: NEGATIVE mg/dL
Leukocytes, UA: NEGATIVE
Nitrite: NEGATIVE
Protein, ur: NEGATIVE mg/dL
SPECIFIC GRAVITY, URINE: 1.015 (ref 1.005–1.030)
pH: 7 (ref 5.0–8.0)

## 2015-02-24 LAB — CBC
HEMATOCRIT: 34.9 % — AB (ref 36.0–46.0)
Hemoglobin: 12 g/dL (ref 12.0–15.0)
MCH: 29.5 pg (ref 26.0–34.0)
MCHC: 34.4 g/dL (ref 30.0–36.0)
MCV: 85.7 fL (ref 78.0–100.0)
PLATELETS: 248 10*3/uL (ref 150–400)
RBC: 4.07 MIL/uL (ref 3.87–5.11)
RDW: 13.4 % (ref 11.5–15.5)
WBC: 9.4 10*3/uL (ref 4.0–10.5)

## 2015-02-24 LAB — COMPREHENSIVE METABOLIC PANEL
ALT: 13 U/L — ABNORMAL LOW (ref 14–54)
ANION GAP: 9 (ref 5–15)
AST: 19 U/L (ref 15–41)
Albumin: 2.9 g/dL — ABNORMAL LOW (ref 3.5–5.0)
Alkaline Phosphatase: 43 U/L (ref 38–126)
BILIRUBIN TOTAL: 0.5 mg/dL (ref 0.3–1.2)
BUN: 7 mg/dL (ref 6–20)
CO2: 23 mmol/L (ref 22–32)
CREATININE: 0.56 mg/dL (ref 0.44–1.00)
Calcium: 8.8 mg/dL — ABNORMAL LOW (ref 8.9–10.3)
Chloride: 104 mmol/L (ref 101–111)
Glucose, Bld: 75 mg/dL (ref 65–99)
POTASSIUM: 3.7 mmol/L (ref 3.5–5.1)
Sodium: 136 mmol/L (ref 135–145)
Total Protein: 6.2 g/dL — ABNORMAL LOW (ref 6.5–8.1)

## 2015-02-24 MED ORDER — PROMETHAZINE HCL 25 MG/ML IJ SOLN
25.0000 mg | Freq: Once | INTRAMUSCULAR | Status: AC
  Administered 2015-02-24: 25 mg via INTRAVENOUS
  Filled 2015-02-24: qty 1

## 2015-02-24 MED ORDER — FAMOTIDINE IN NACL 20-0.9 MG/50ML-% IV SOLN
20.0000 mg | Freq: Once | INTRAVENOUS | Status: AC
  Administered 2015-02-24: 20 mg via INTRAVENOUS
  Filled 2015-02-24: qty 50

## 2015-02-24 MED ORDER — LACTATED RINGERS IV BOLUS (SEPSIS)
1000.0000 mL | Freq: Once | INTRAVENOUS | Status: AC
  Administered 2015-02-24: 1000 mL via INTRAVENOUS

## 2015-02-24 MED ORDER — MAGNESIUM HYDROXIDE 400 MG/5ML PO SUSP: 30.0000 mL | Freq: Every day | ORAL | Status: DC | PRN

## 2015-02-24 MED ORDER — PROGESTERONE MICRONIZED 200 MG PO CAPS: 200.0000 mg | ORAL_CAPSULE | Freq: Every day | ORAL | Status: DC

## 2015-02-24 NOTE — MAU Note (Signed)
Pt presents to MAU with complaints of nausea since 2 this morning. Pt also reports constipation. Denies any vaginal bleeding or abnormal discharge

## 2015-02-24 NOTE — MAU Provider Note (Signed)
Chief Complaint:  Constipation   First Provider Initiated Contact with Patient 02/24/15 1227      HPI: Catherine Osborne is a 40 y.o. [id number] at [redacted]w[redacted]d who presents to maternity admissions reporting nausea starting this morning making it difficult to eat. She reports she has not eaten all day and has only had small sips of fluids.   She had nausea in early pregnancy that resolved but has not been nauseous recently until today.  She denies vomiting.  She has not taken any nausea medications.  She also reports constipation, having a bowel movement today but straining to go. She has not tried any treatments, including dietary changes or medications, for her constipation.  She recently started procardia 2 weeks ago for symptoms of preterm labor in MAU and is taking a daily prenatal vitamin.  She was diagnosed with shortened cervix at 2.4 cm in length 2 weeks ago.  She reports good fetal movement, denies cramping, LOF, vaginal bleeding, vaginal itching/burning, urinary symptoms, h/a, dizziness, n/v, or fever/chills.    HPI  Past Medical History: Past Medical History  Diagnosis Date  . Asthma   . Pneumonia   . Allergy     Past obstetric history: OB History  Gravida Para Term Preterm AB SAB TAB Ectopic Multiple Living  14 1  1 12 10 2   0 1    # Outcome Date GA Lbr Len/2nd Weight Sex Delivery Anes PTL Lv  14 Current           13 Preterm  [redacted]w[redacted]d    Vag-Spont     12 SAB           11 SAB           10 SAB           9 SAB           8 SAB           7 SAB           6 SAB           5 SAB           4 SAB           3 TAB           2 TAB           1 SAB               Past Surgical History: Past Surgical History  Procedure Laterality Date  . Dnc    . Foot surgery    . Dilation and evacuation N/A 05/22/2012    Procedure: DILATATION AND EVACUATION;  Surgeon: Frederico Hamman, MD;  Location: [facility];  Service: Gynecology;  Laterality: N/A;    Family History: Family History  Problem  Relation Age of Onset  . Bone cancer Mother   . Breast cancer Maternal Grandmother   . Diabetes    . Hypertension    . Stroke    . Heart attack      Social History: Social History  Substance Use Topics  . Smoking status: Former Research scientist (life sciences)  . Smokeless tobacco: None  . Alcohol Use: No    Allergies: No Known Allergies  Meds:  No prescriptions prior to admission    ROS:  Review of Systems  Constitutional: Negative for fever, chills and fatigue.  Respiratory: Negative for shortness of breath.   Cardiovascular: Negative for chest pain.  Gastrointestinal: Positive for nausea and constipation. Negative for  vomiting and abdominal pain.  Genitourinary: Negative for dysuria, flank pain, vaginal bleeding, vaginal discharge, difficulty urinating, vaginal pain and pelvic pain.  Neurological: Negative for dizziness and headaches.  Psychiatric/Behavioral: Negative.      I have reviewed patient's Past Medical Hx, Surgical Hx, Family Hx, Social Hx, medications and allergies.   Physical Exam   Patient Vitals for the past 24 hrs:  BP Temp Temp src Pulse Resp SpO2 Height Weight  02/24/15 1637 110/62 mmHg 98.5 F (36.9 C) Oral 75 16 - - -  02/24/15 1118 98/62 mmHg 98.6 F (37 C) Oral 87 17 100 % 5' 3.39" (1.61 m) 167 lb 6.4 oz (75.932 kg)   Constitutional: Well-developed, well-nourished female in no acute distress.  Cardiovascular: normal rate Respiratory: normal effort GI: Abd soft, non-tender, gravid appropriate for gestational age.  MS: Extremities nontender, no edema, normal ROM Neurologic: Alert and oriented x 4.  GU: Neg CVAT.  Cervix closed, 70 % effaced, posterior  Dilation: Closed Exam by:: Danelle Berry CNM    FHT:  154 by doppler   Labs: Results for orders placed or performed during the hospital encounter of 02/24/15 (from the past 24 hour(s))  Urinalysis, Routine w reflex microscopic (not at [facility])     Status: None   Collection Time: 02/24/15 11:55 AM  Result Value  Ref Range   Color, Urine YELLOW YELLOW   APPearance CLEAR CLEAR   Specific Gravity, Urine 1.015 1.005 - 1.030   pH 7.0 5.0 - 8.0   Glucose, UA NEGATIVE NEGATIVE mg/dL   Hgb urine dipstick NEGATIVE NEGATIVE   Bilirubin Urine NEGATIVE NEGATIVE   Ketones, ur NEGATIVE NEGATIVE mg/dL   Protein, ur NEGATIVE NEGATIVE mg/dL   Nitrite NEGATIVE NEGATIVE   Leukocytes, UA NEGATIVE NEGATIVE  CBC     Status: Abnormal   Collection Time: 02/24/15 12:51 PM  Result Value Ref Range   WBC 9.4 4.0 - 10.5 K/uL   RBC 4.07 3.87 - 5.11 MIL/uL   Hemoglobin 12.0 12.0 - 15.0 g/dL   HCT 34.9 (L) 36.0 - 46.0 %   MCV 85.7 78.0 - 100.0 fL   MCH 29.5 26.0 - 34.0 pg   MCHC 34.4 30.0 - 36.0 g/dL   RDW 13.4 11.5 - 15.5 %   Platelets 248 150 - 400 K/uL  Comprehensive metabolic panel     Status: Abnormal   Collection Time: 02/24/15 12:51 PM  Result Value Ref Range   Sodium 136 135 - 145 mmol/L   Potassium 3.7 3.5 - 5.1 mmol/L   Chloride 104 101 - 111 mmol/L   CO2 23 22 - 32 mmol/L   Glucose, Bld 75 65 - 99 mg/dL   BUN 7 6 - 20 mg/dL   Creatinine, Ser 0.56 0.44 - 1.00 mg/dL   Calcium 8.8 (L) 8.9 - 10.3 mg/dL   Total Protein 6.2 (L) 6.5 - 8.1 g/dL   Albumin 2.9 (L) 3.5 - 5.0 g/dL   AST 19 15 - 41 U/L   ALT 13 (L) 14 - 54 U/L   Alkaline Phosphatase 43 38 - 126 U/L   Total Bilirubin 0.5 0.3 - 1.2 mg/dL   GFR calc non Af Amer >60 >60 mL/min   GFR calc Af Amer >60 >60 mL/min   Anion gap 9 5 - 15      Imaging:  MFM transvaginal US Preliminary report with cervical length 1.5 cm, V shaped with some funneling  MAU Course/MDM: I have ordered labs and [place] reviewed results.  Cervical  exam with significant effacement, so transvaginal US ordered.  Shortening cervix on today's [place].  Consult Dr Ruthann Cancer.  Treatments in MAU included LR x 1000 ml, Phenergan 25 mg IV, Pepcid 20 mg IV.  Pt reports some improvement in nausea.  Pt sleeping and difficult to arouse after medications given.  No vomiting while in MAU and  tolerating PO fluids and crackers.  D/C home with treatment for constipation with daily milk of magnesia, and vaginal progesterone 200 mg daily.  Pt stable at time of discharge.  Assessment: 1. Short cervix affecting pregnancy   2. Constipation during pregnancy, second trimester     Plan: Discharge home Preterm precautions and fetal kick counts Continue Procardia XL as prescribed Take Phenergan 25 mg PO Q 6 hours PRN as prescribed Start Prometrium 200 mg pv Q HS Milk of magnesia daily PRN Increase PO fluids      Follow-up Information    Follow up with Frederico Hamman, MD.   Specialty:  Obstetrics and Gynecology   Why:  Follow up with Dr Ruthann Cancer on Monday at 10:00 am.  Return to MAU as needed for emergencies.   Contact information:   [address] [place] [address] [phone]        Medication List    TAKE these medications        EPINEPHrine 0.3 mg/0.3 mL Soaj injection  Commonly known as:  EPI-PEN  Inject 0.3 mg into the muscle once as needed (allergic reaction).     magnesium hydroxide 400 MG/5ML suspension  Commonly known as:  MILK OF MAGNESIA  Take 30 mLs by mouth daily as needed for mild constipation.     NIFEdipine 60 MG 24 hr tablet  Commonly known as:  PROCARDIA XL/ADALAT-CC  Take 60 mg by mouth daily.     prenatal multivitamin Tabs tablet  Take 1 tablet by mouth daily at 12 noon.     progesterone 200 MG capsule  Commonly known as:  PROMETRIUM  Place 1 capsule (200 mg total) vaginally daily.     valACYclovir 1000 MG tablet  Commonly known as:  VALTREX  Take 500 mg by mouth daily.        Fatima Blank Certified Nurse-Midwife 02/24/2015 7:34 PM

## 2015-02-24 NOTE — Discharge Instructions (Signed)
Cervical Insufficiency Cervical insufficiency is when the cervix is weak and starts to open (dilate) and thin (efface) before the pregnancy is at term and without labor starting. This is also called incompetent cervix. It can happen in the second or third trimester when the fetus starts putting pressure on the cervix. Cervical insufficiency can lead to a miscarriage, preterm premature rupture of the membranes (PPROM), or having the baby early (preterm birth).  RISK FACTORS You may be more likely to develop cervical insufficiency if:  You have a shorter cervix than normal.  Damage or injury occurred to your cervix from a past pregnancy or surgery.  You were born with a cervical defect.  You have had a procedure done on the cervix, such as cervical biopsy.  You have a history of cervical insufficiency.  You have a history of PPROM.  You have ended several past pregnancies through abortion.  You were exposed to the drug diethylstilbestrol (DES). SYMPTOMS Often times, women do not have any symptoms. Other times, women may only have mild symptoms that often start between week 14 through 20. The symptoms may last several days or weeks. These symptoms include:  Light spotting or bleeding from the vagina.  Pelvic pressure.  A change in vaginal discharge, such as discharge that changes from clear, white, or light yellow to pink or tan.  Back pain.  Abdominal pain or cramping. DIAGNOSIS Cervical insufficiency cannot be diagnosed before you become pregnant. Once you are pregnant, your health care provider will ask about your medical history and if you have had any problems in past pregnancies. Tell your health care provider about any procedures performed on your cervix or if you have a history of miscarriages or cervical insufficiency. If your health care provider thinks you are at high risk for cervical insufficiency or show signs of cervical insufficiency, he or she may:  Perform a pelvic  exam. This will check for:  The presence of the membranes (amniotic sac) coming out of the cervix.  Cervical abnormalities.  Cervical injuries.  The presence of contractions.  Perform an ultrasonography (commonly called ultrasound) to measure the length and thickness of the cervix. TREATMENT If you have been diagnosed with cervical insufficiency, your health care provider may recommend:  Limiting physical activity.  Bed rest at home or in the hospital.  Pelvic rest, which means no sexual intercourse or placing anything in the vagina.  Cerclage to sew the cervix closed and prevent it from opening too early. The stitches (sutures) are removed between weeks 36 and 38 to avoid problems during labor. Cerclage may be recommended during pregnancy if you have had a history of miscarriages or preterm births without a known cause. It may also be recommended if you have a short cervix that was identified by ultrasound or if your health care provider has found that your cervix has dilated before 24 weeks of pregnancy. Limiting physical activity and bed rest may or may not help prevent a preterm birth. WHEN SHOULD YOU SEEK IMMEDIATE MEDICAL CARE?  Seek immediate medical care if you show any symptoms of cervical insufficiency. You will need to go to the hospital to get checked immediately.   This information is not intended to replace advice given to you by your health care provider. Make sure you discuss any questions you have with your health care provider.   Document Released: 01/17/2005 Document Revised: 02/07/2014 Document Reviewed: 03/26/2012 Elsevier Interactive Patient Education 2016 [employer].   Constipation, Adult Constipation is when a  person has fewer than three bowel movements a week, has difficulty having a bowel movement, or has stools that are dry, hard, or larger than normal. As people grow older, constipation is more common. A low-fiber diet, not taking in enough fluids, and  taking certain medicines may make constipation worse.  CAUSES   Certain medicines, such as antidepressants, pain medicine, iron supplements, antacids, and water pills.   Certain diseases, such as diabetes, irritable bowel syndrome (IBS), thyroid disease, or depression.   Not drinking enough water.   Not eating enough fiber-rich foods.   Stress or travel.   Lack of physical activity or exercise.   Ignoring the urge to have a bowel movement.   Using laxatives too much.  SIGNS AND SYMPTOMS   Having fewer than three bowel movements a week.   Straining to have a bowel movement.   Having stools that are hard, dry, or larger than normal.   Feeling full or bloated.   Pain in the lower abdomen.   Not feeling relief after having a bowel movement.  DIAGNOSIS  Your health care provider will take a medical history and perform a physical exam. Further testing may be done for severe constipation. Some tests may include:  A barium enema X-ray to examine your rectum, colon, and, sometimes, your small intestine.   A sigmoidoscopy to examine your lower colon.   A colonoscopy to examine your entire colon. TREATMENT  Treatment will depend on the severity of your constipation and what is causing it. Some dietary treatments include drinking more fluids and eating more fiber-rich foods. Lifestyle treatments may include regular exercise. If these diet and lifestyle recommendations do not help, your health care provider may recommend taking over-the-counter laxative medicines to help you have bowel movements. Prescription medicines may be prescribed if over-the-counter medicines do not work.  HOME CARE INSTRUCTIONS   Eat foods that have a lot of fiber, such as fruits, vegetables, whole grains, and beans.  Limit foods high in fat and processed sugars, such as french fries, hamburgers, cookies, candies, and soda.   A fiber supplement may be added to your diet if you cannot get  enough fiber from foods.   Drink enough fluids to keep your urine clear or pale yellow.   Exercise regularly or as directed by your health care provider.   Go to the restroom when you have the urge to go. Do not hold it.   Only take over-the-counter or prescription medicines as directed by your health care provider. Do not take other medicines for constipation without talking to your health care provider first.  [place] IF:   You have bright red blood in your stool.   Your constipation lasts for more than 4 days or gets worse.   You have abdominal or rectal pain.   You have thin, pencil-like stools.   You have unexplained weight loss. MAKE SURE YOU:   Understand these instructions.  Will watch your condition.  Will get help right away if you are not doing well or get worse.   This information is not intended to replace advice given to you by your health care provider. Make sure you discuss any questions you have with your health care provider.   Document Released: 10/16/2003 Document Revised: 02/07/2014 Document Reviewed: 10/29/2012 Elsevier Interactive Patient Education [employer].

## 2015-02-25 ENCOUNTER — Observation Stay (HOSPITAL_COMMUNITY)
Admission: AD | Admit: 2015-02-25 | Discharge: 2015-02-27 | Disposition: A | Discharge status: Home / Self Care | Payer: Medicaid Other | Source: Ambulatory Visit | Attending: Obstetrics | Admitting: Obstetrics

## 2015-02-25 ENCOUNTER — Encounter (HOSPITAL_COMMUNITY): Payer: Self-pay | Admitting: *Deleted

## 2015-02-25 DIAGNOSIS — O3432 Maternal care for cervical incompetence, second trimester: Principal | ICD-10-CM | POA: Insufficient documentation

## 2015-02-25 DIAGNOSIS — Z3A22 22 weeks gestation of pregnancy: Secondary | ICD-10-CM | POA: Diagnosis not present

## 2015-02-25 DIAGNOSIS — O09529 Supervision of elderly multigravida, unspecified trimester: Secondary | ICD-10-CM

## 2015-02-25 DIAGNOSIS — O26879 Cervical shortening, unspecified trimester: Secondary | ICD-10-CM | POA: Diagnosis present

## 2015-02-25 LAB — TYPE AND SCREEN
ABO/RH(D): B POS
ANTIBODY SCREEN: NEGATIVE

## 2015-02-25 LAB — CBC
HEMATOCRIT: 35.8 % — AB (ref 36.0–46.0)
HEMOGLOBIN: 12.1 g/dL (ref 12.0–15.0)
MCH: 29.3 pg (ref 26.0–34.0)
MCHC: 33.8 g/dL (ref 30.0–36.0)
MCV: 86.7 fL (ref 78.0–100.0)
Platelets: 271 10*3/uL (ref 150–400)
RBC: 4.13 MIL/uL (ref 3.87–5.11)
RDW: 13.9 % (ref 11.5–15.5)
WBC: 9 10*3/uL (ref 4.0–10.5)

## 2015-02-25 MED ORDER — PRENATAL MULTIVITAMIN CH
1.0000 | ORAL_TABLET | Freq: Every day | ORAL | Status: DC
  Administered 2015-02-27: 1 via ORAL
  Filled 2015-02-25: qty 1

## 2015-02-25 MED ORDER — NIFEDIPINE ER 60 MG PO TB24
60.0000 mg | ORAL_TABLET | Freq: Every day | ORAL | Status: DC
  Administered 2015-02-25 – 2015-02-26 (×2): 60 mg via ORAL
  Filled 2015-02-25 (×3): qty 1

## 2015-02-25 MED ORDER — CALCIUM CARBONATE ANTACID 500 MG PO CHEW: 2.0000 | CHEWABLE_TABLET | ORAL | Status: DC | PRN

## 2015-02-25 MED ORDER — ACETAMINOPHEN 325 MG PO TABS
650.0000 mg | ORAL_TABLET | ORAL | Status: DC | PRN
  Administered 2015-02-27: 650 mg via ORAL
  Filled 2015-02-25: qty 2

## 2015-02-25 MED ORDER — DOCUSATE SODIUM 100 MG PO CAPS
100.0000 mg | ORAL_CAPSULE | Freq: Every day | ORAL | Status: DC
  Administered 2015-02-25 – 2015-02-27 (×2): 100 mg via ORAL
  Filled 2015-02-25 (×2): qty 1

## 2015-02-25 MED ORDER — ZOLPIDEM TARTRATE 5 MG PO TABS
5.0000 mg | ORAL_TABLET | Freq: Every evening | ORAL | Status: DC | PRN
  Administered 2015-02-26 – 2015-02-27 (×2): 5 mg via ORAL
  Filled 2015-02-25 (×2): qty 1

## 2015-02-25 MED ORDER — PROGESTERONE MICRONIZED 200 MG PO CAPS
200.0000 mg | ORAL_CAPSULE | Freq: Every day | ORAL | Status: DC
  Administered 2015-02-25 – 2015-02-26 (×2): 200 mg via VAGINAL
  Filled 2015-02-25 (×3): qty 1

## 2015-02-25 NOTE — Progress Notes (Signed)
CSW contacted by RN requesting immediate social work consult due to MD expressing concerns about the patient being homeless.  Patient presented as easily engaged and receptive to the visit. She displayed a full range in affect and was in a pleasant mood.  The patient confirmed that she will be evicted from her apartment, but she is unsure of the exact date. She discussed at length the information that she has received that makes it difficult for her to know if she needs to leave her apartment on February 1 or if she will be able to remain until March.  Patient stated that she has already contacted the housing authority and the housing coalition. She shared that she is currently on the wait list for Section 8, but has been on the wait list for more than 2 years. Patient reported that she is not eligible for [facility] or [facility] since she no longer has an income. She stated that she thinks she may have completed an application for [facility], but reported that she is unsure of the current status of the application. Patient reported that she recently applied for and started to participate in the [employer] program. Per patient, she will start to receive monetary benefits from this program in February. She also stated that she will be able to receive her tax return shortly.   Per patient, she does not want to stay in a shelter since she also has an 78 year old son. Patient reported that she has a friend that she can stay with, but discussed that it is less than ideal since her friend has 3 children and she does not want to be a burden. She recognized that she only "feels" like a burden, and that her friend has voiced interest in helping her.  Per patient, she would also offer her housing to a friend in a need if the roles were reversed.  Patient also stated that she has some money where she can pay for a hotel room if needed.  Patient was receptive to recommendations to follow up with [facility] at the  [facility] to determine status of application. CSW also provided patient with contact information to the [employer], and explained coordinate intake process for all [facility] shelters in the event that she needs a place to stay.  Patient expressed appreciation for the visit, and denied additional questions, concerns, or needs at this time.    Patient also expressed interest in a referral for OB case management. CSW to follow up.   Per patient she intends to continue to live in her current apartment until she is notified that she can longer remain. She stated that she will then either transition to a hotel or her friend's home until she is able to obtain enough money (from tax return and [employer]) to secure new housing.    No barriers to discharge, but agreed to contact CSW if additional needs arise during this admission.

## 2015-02-25 NOTE — H&P (Signed)
This is Dr. Gracy Racer dictating the history and physical on  Catherine Osborne  she's a 40 -year-old gravida 6 para 55 has had 4 abortions in the past she's 22 weeks and a day she is due 06/30/2015 she has a history of herpes no lesions since she's been pregnant she has been on Procardia 60 XL because of premature contractions and was started on Prometrium 200 intravaginally yesterday but has not had the prescription filled   10 days ago her cervix was 3.7 cm in length and yesterday it was 1.5-1.9 and patient is in for cervical cerclage tomorrow she understands the risks she was also seen by the social worker since admission because she  Lost her job  and may soon be homeless Past medical history history of herpes and takes Valtrex  From  time to time she is also had a number terminations Past surgical history negative Social history as above Family history negative System review noncontributory Physical exam well-developed female in no distress HEENT negative Lungs clear to P&A Heart regular rhythm no murmurs no gallops Breasts negative Abdomen uterus 22 weeks size Cervix shortened and closed Extremities negative

## 2015-02-26 ENCOUNTER — Observation Stay (HOSPITAL_COMMUNITY): Payer: Medicaid Other | Admitting: Anesthesiology

## 2015-02-26 ENCOUNTER — Encounter (HOSPITAL_COMMUNITY)
Admission: AD | Disposition: A | Discharge status: Home / Self Care | Payer: Self-pay | Source: Ambulatory Visit | Attending: Obstetrics

## 2015-02-26 DIAGNOSIS — Z3A22 22 weeks gestation of pregnancy: Secondary | ICD-10-CM | POA: Diagnosis not present

## 2015-02-26 DIAGNOSIS — O3432 Maternal care for cervical incompetence, second trimester: Secondary | ICD-10-CM | POA: Diagnosis not present

## 2015-02-26 HISTORY — PX: CERVICAL CERCLAGE: SHX1329

## 2015-02-26 SURGERY — CERCLAGE, CERVIX, VAGINAL APPROACH
Anesthesia: Spinal

## 2015-02-26 MED ORDER — HYDROMORPHONE HCL 1 MG/ML IJ SOLN
0.2500 mg | INTRAMUSCULAR | Status: DC | PRN
  Administered 2015-02-26: 0.25 mg via INTRAVENOUS

## 2015-02-26 MED ORDER — BUPIVACAINE IN DEXTROSE 0.75-8.25 % IT SOLN
INTRATHECAL | Status: DC | PRN
  Administered 2015-02-26: 7.5 mg via INTRATHECAL

## 2015-02-26 MED ORDER — KETOROLAC TROMETHAMINE 30 MG/ML IJ SOLN: 30.0000 mg | Freq: Once | INTRAMUSCULAR | Status: DC

## 2015-02-26 MED ORDER — LACTATED RINGERS IV SOLN
INTRAVENOUS | Status: DC | PRN
  Administered 2015-02-26: 16:00:00 via INTRAVENOUS

## 2015-02-26 MED ORDER — SODIUM CHLORIDE 0.9% FLUSH
INTRAVENOUS | Status: AC
  Filled 2015-02-26: qty 3

## 2015-02-26 MED ORDER — PHENYLEPHRINE HCL 10 MG/ML IJ SOLN
INTRAMUSCULAR | Status: DC | PRN
  Administered 2015-02-26 (×2): 80 ug via INTRAVENOUS
  Administered 2015-02-26: 40 ug via INTRAVENOUS

## 2015-02-26 MED ORDER — HYDROMORPHONE HCL 1 MG/ML IJ SOLN
INTRAMUSCULAR | Status: AC
  Filled 2015-02-26: qty 1

## 2015-02-26 MED ORDER — MEPERIDINE HCL 25 MG/ML IJ SOLN
6.2500 mg | INTRAMUSCULAR | Status: DC | PRN
  Administered 2015-02-26: 12.5 mg via INTRAVENOUS

## 2015-02-26 MED ORDER — PROMETHAZINE HCL 25 MG/ML IJ SOLN: 6.2500 mg | INTRAMUSCULAR | Status: DC | PRN

## 2015-02-26 MED ORDER — MEPERIDINE HCL 25 MG/ML IJ SOLN
INTRAMUSCULAR | Status: AC
  Filled 2015-02-26: qty 1

## 2015-02-26 SURGICAL SUPPLY — 19 items
CLOTH BEACON ORANGE TIMEOUT ST (SAFETY) ×3 IMPLANT
COUNTER NEEDLE 1200 MAGNETIC (NEEDLE) ×3 IMPLANT
GLOVE BIO SURGEON STRL SZ8.5 (GLOVE) ×3 IMPLANT
GLOVE BIOGEL PI IND STRL 7.0 (GLOVE) IMPLANT
GLOVE BIOGEL PI INDICATOR 7.0 (GLOVE)
GOWN STRL REUS W/TWL 2XL LVL3 (GOWN DISPOSABLE) ×3 IMPLANT
GOWN STRL REUS W/TWL LRG LVL3 (GOWN DISPOSABLE) ×3 IMPLANT
NEEDLE MA TROC 1/2 (NEEDLE) IMPLANT
NEEDLE MAYO CATGUT SZ4 (NEEDLE) ×3 IMPLANT
PACK VAGINAL MINOR WOMEN LF (CUSTOM PROCEDURE TRAY) ×3 IMPLANT
PAD OB MATERNITY 4.3X12.25 (PERSONAL CARE ITEMS) ×3 IMPLANT
PAD PREP 24X48 CUFFED NSTRL (MISCELLANEOUS) ×3 IMPLANT
SUT MERSILENE 5MM BP 1 12 (SUTURE) ×3 IMPLANT
TOWEL OR 17X24 6PK STRL BLUE (TOWEL DISPOSABLE) ×3 IMPLANT
TRAY FOLEY CATH SILVER 14FR (SET/KITS/TRAYS/PACK) ×3 IMPLANT
TUBING NON-CON 1/4 X 20 CONN (TUBING) IMPLANT
TUBING NON-CON 1/4 X 20' CONN (TUBING)
WATER STERILE IRR 1000ML POUR (IV SOLUTION) IMPLANT
YANKAUER SUCT BULB TIP NO VENT (SUCTIONS) IMPLANT

## 2015-02-26 NOTE — Anesthesia Postprocedure Evaluation (Signed)
Anesthesia Post Note  Patient: Catherine Osborne  Procedure(s) Performed: Procedure(s) (LRB): CERCLAGE CERVICAL (N/A)  Patient location during evaluation: PACU Anesthesia Type: Spinal Level of consciousness: awake Pain management: pain level controlled Vital Signs Assessment: post-procedure vital signs reviewed and stable Respiratory status: spontaneous breathing Cardiovascular status: stable Postop Assessment: no headache, no backache, spinal receding, patient able to bend at knees and no signs of nausea or vomiting Anesthetic complications: no    Last Vitals:  Filed Vitals:   02/26/15 1630 02/26/15 1645  BP: 110/65 94/67  Pulse: 75 80  Temp:    Resp: 15 16    Last Pain:  Filed Vitals:   02/26/15 1729  PainSc: [place]

## 2015-02-26 NOTE — Anesthesia Procedure Notes (Signed)
Spinal Patient location during procedure: OR Start time: 02/26/2015 3:45 PM End time: 02/26/2015 3:48 PM Staffing Anesthesiologist: Lyn Hollingshead Performed by: anesthesiologist  Preanesthetic Checklist Completed: patient identified, site marked, surgical consent, pre-op evaluation, timeout performed, IV checked, risks and benefits discussed and monitors and equipment checked Spinal Block Patient position: sitting Prep: site prepped and draped and DuraPrep Patient monitoring: heart rate, cardiac monitor, continuous pulse ox and blood pressure Approach: midline Location: L3-4 Injection technique: single-shot Needle Needle type: Sprotte  Needle gauge: 24 G Needle length: 9 cm Needle insertion depth: 5 cm Assessment Sensory level: T10

## 2015-02-26 NOTE — Progress Notes (Signed)
Patient in PACU post cerclage. Crying, c/o pain RLQ across abdomen. Right abdomen hard on arrival but softer now. Rapid OB Response nurse called.

## 2015-02-26 NOTE — Anesthesia Preprocedure Evaluation (Signed)
Anesthesia Evaluation  Patient identified by MRN, date of birth, ID band Patient awake    Reviewed: Allergy & Precautions, H&P , NPO status , Patient's Chart, lab work & pertinent test results  Airway Mallampati: I  TM Distance: >3 FB Neck ROM: full    Dental no notable dental hx.    Pulmonary former smoker,    Pulmonary exam normal        Cardiovascular negative cardio ROS Normal cardiovascular exam     Neuro/Psych negative neurological ROS  negative psych ROS   GI/Hepatic negative GI ROS, Neg liver ROS,   Endo/Other  negative endocrine ROS  Renal/GU negative Renal ROS     Musculoskeletal   Abdominal Normal abdominal exam  (+)   Peds  Hematology negative hematology ROS (+)   Anesthesia Other Findings   Reproductive/Obstetrics (+) Pregnancy                             Anesthesia Physical Anesthesia Plan  ASA: II  Anesthesia Plan: Spinal   Post-op Pain Management:    Induction:   Airway Management Planned:   Additional Equipment:   Intra-op Plan:   Post-operative Plan:   Informed Consent: I have reviewed the patients History and Physical, chart, labs and discussed the procedure including the risks, benefits and alternatives for the proposed anesthesia with the patient or authorized representative who has indicated his/her understanding and acceptance.     Plan Discussed with: CRNA and Surgeon  Anesthesia Plan Comments:         Anesthesia Quick Evaluation  

## 2015-02-26 NOTE — Progress Notes (Signed)
[name], female   DOB: January 21, 1976, 40 y.o.   MRN: [id number] Vital signs normal For cerclage this after known

## 2015-02-26 NOTE — Addendum Note (Signed)
Addendum  created 02/26/15 1913 by Jonna Munro, CRNA   Modules edited: Anesthesia Review and Sign Navigator Section

## 2015-02-26 NOTE — Transfer of Care (Signed)
Immediate Anesthesia Transfer of Care Note  Patient: Catherine Osborne  Procedure(s) Performed: Procedure(s): CERCLAGE CERVICAL (N/A)  Patient Location: PACU  Anesthesia Type:Spinal  Level of Consciousness: awake, alert  and oriented  Airway & Oxygen Therapy: Patient Spontanous Breathing  Post-op Assessment: Report given to RN and Post -op Vital signs reviewed and stable  Post vital signs: Reviewed and stable  Last Vitals:  Filed Vitals:   02/26/15 1245 02/26/15 1509  BP: 102/58 114/55  Pulse: 76 85  Temp: 36.8 C 36.8 C  Resp: 18 18    Complications: No apparent anesthesia complications

## 2015-02-26 NOTE — Op Note (Signed)
Preop diagnosis incompetent cervix Postop diagnosis is same Procedure cervical cerclage Anesthesia spinal Surgeon Dr. Gracy Racer after the patient had the spinal patient was in lithotomy position perineum and vagina prepped and draped Foley catheter inserted weighted speculum placed in the vagina and the cervix grasped at 12:00 without sponge forcep using a #5 [place] band cervical cerclage was placed high up on the cervix and tied at   6:00 in the usual manner patient tolerated procedure well

## 2015-02-26 NOTE — Progress Notes (Signed)
Junie Panning, rapid OB response nurse here at bedside assessing patient.

## 2015-02-27 ENCOUNTER — Encounter (HOSPITAL_COMMUNITY): Payer: Self-pay | Admitting: Obstetrics

## 2015-02-27 DIAGNOSIS — O3432 Maternal care for cervical incompetence, second trimester: Secondary | ICD-10-CM | POA: Diagnosis not present

## 2015-02-27 NOTE — Discharge Summary (Signed)
Patient is a 40 year old female a 22 weeks pregnancy who 2 weeks ago she had a cervical length of 3.7 and 3 days ago the cervical length was 1.5-1.9 by ultrasound and she was brought in and had a cerclage placed yesterday she has done well and she been discharged home today to see me in 2 weeks

## 2015-02-27 NOTE — Discharge Instructions (Signed)
Discharge instructions   You can wash your hair  Shower  Eat what you want  Drink what you want  See me in 6 weeks  Your ankles are going to swell more in the next 2 weeks than when pregnant  No sex for 6 weeks   Sulayman Manning A, MD 02/27/2015

## 2015-02-27 NOTE — Anesthesia Postprocedure Evaluation (Signed)
Anesthesia Post Note  Patient: Catherine Osborne  Procedure(s) Performed: Procedure(s) (LRB): CERCLAGE CERVICAL (N/A)  Patient location during evaluation: Antenatal Anesthesia Type: Spinal Level of consciousness: awake and alert and oriented Pain management: pain level controlled Vital Signs Assessment: post-procedure vital signs reviewed and stable Respiratory status: spontaneous breathing Cardiovascular status: blood pressure returned to baseline Postop Assessment: no headache, patient able to bend at knees, no signs of nausea or vomiting and adequate PO intake (c/o back pain when lays down but sitting upright in bed rated pain a "2") Anesthetic complications: no    Last Vitals:  Filed Vitals:   02/27/15 0245 02/27/15 0837  BP:  104/68  Pulse:  92  Temp:  36.9 C  Resp: 18 16    Last Pain:  Filed Vitals:   02/27/15 0849  PainSc: 2                  Devario Bucklew

## 2015-02-27 NOTE — Progress Notes (Signed)
[name], female   DOB: 03/27/1975, 40 y.o.   MRN: [id number] Blood pressure 103/58 breasts radiation 18 pulse 89 afebrile And cerclage placed yesterday doing well had some back pain complaining of awareness spinal was done otherwise doing well home today

## 2015-02-27 NOTE — Addendum Note (Signed)
Addendum  created 02/27/15 0911 by Talbot Grumbling, CRNA   Modules edited: Clinical Notes   Clinical Notes:  File: [id number]

## 2015-03-18 ENCOUNTER — Inpatient Hospital Stay (HOSPITAL_COMMUNITY)
Admit: 2015-03-18 | Discharge: 2015-03-18 | Disposition: A | Discharge status: Home / Self Care | Payer: Medicaid Other | Source: Ambulatory Visit | Attending: Obstetrics | Admitting: Obstetrics

## 2015-03-18 ENCOUNTER — Encounter (HOSPITAL_COMMUNITY): Payer: Self-pay | Admitting: *Deleted

## 2015-03-18 DIAGNOSIS — O4702 False labor before 37 completed weeks of gestation, second trimester: Secondary | ICD-10-CM

## 2015-03-18 DIAGNOSIS — Z3A25 25 weeks gestation of pregnancy: Secondary | ICD-10-CM | POA: Diagnosis not present

## 2015-03-18 DIAGNOSIS — Z590 Homelessness: Secondary | ICD-10-CM | POA: Insufficient documentation

## 2015-03-18 DIAGNOSIS — N883 Incompetence of cervix uteri: Secondary | ICD-10-CM | POA: Insufficient documentation

## 2015-03-18 DIAGNOSIS — O26899 Other specified pregnancy related conditions, unspecified trimester: Secondary | ICD-10-CM | POA: Diagnosis not present

## 2015-03-18 DIAGNOSIS — R5383 Other fatigue: Secondary | ICD-10-CM

## 2015-03-18 DIAGNOSIS — Z87891 Personal history of nicotine dependence: Secondary | ICD-10-CM | POA: Diagnosis not present

## 2015-03-18 DIAGNOSIS — E860 Dehydration: Secondary | ICD-10-CM | POA: Insufficient documentation

## 2015-03-18 DIAGNOSIS — F329 Major depressive disorder, single episode, unspecified: Secondary | ICD-10-CM | POA: Insufficient documentation

## 2015-03-18 DIAGNOSIS — R110 Nausea: Secondary | ICD-10-CM

## 2015-03-18 DIAGNOSIS — J45909 Unspecified asthma, uncomplicated: Secondary | ICD-10-CM | POA: Diagnosis not present

## 2015-03-18 DIAGNOSIS — R5381 Other malaise: Secondary | ICD-10-CM

## 2015-03-18 DIAGNOSIS — O219 Vomiting of pregnancy, unspecified: Secondary | ICD-10-CM | POA: Diagnosis not present

## 2015-03-18 DIAGNOSIS — Z658 Other specified problems related to psychosocial circumstances: Secondary | ICD-10-CM

## 2015-03-18 DIAGNOSIS — R1030 Lower abdominal pain, unspecified: Secondary | ICD-10-CM | POA: Diagnosis not present

## 2015-03-18 MED ORDER — LACTATED RINGERS IV SOLN
INTRAVENOUS | Status: DC
  Administered 2015-03-18: 17:00:00 via INTRAVENOUS

## 2015-03-18 MED ORDER — ONDANSETRON 4 MG PO TBDP: 4.0000 mg | ORAL_TABLET | Freq: Four times a day (QID) | ORAL | Status: DC | PRN

## 2015-03-18 MED ORDER — M.V.I. ADULT IV INJ
Freq: Once | INTRAVENOUS | Status: AC
  Administered 2015-03-18: 15:00:00 via INTRAVENOUS
  Filled 2015-03-18: qty 10

## 2015-03-18 NOTE — Discharge Instructions (Signed)
Braxton Hicks Contractions °Contractions of the uterus can occur throughout pregnancy. Contractions are not always a sign that you are in labor.  °WHAT ARE BRAXTON HICKS CONTRACTIONS?  °Contractions that occur before labor are called Braxton Hicks contractions, or false labor. Toward the end of pregnancy (32-34 weeks), these contractions can develop more often and may become more forceful. This is not true labor because these contractions do not result in opening (dilatation) and thinning of the cervix. They are sometimes difficult to tell apart from true labor because these contractions can be forceful and people have different pain tolerances. You should not feel embarrassed if you go to the hospital with false labor. Sometimes, the only way to tell if you are in true labor is for your health care provider to look for changes in the cervix. °If there are no prenatal problems or other health problems associated with the pregnancy, it is completely safe to be sent home with false labor and await the onset of true labor. °HOW CAN YOU TELL THE DIFFERENCE BETWEEN TRUE AND FALSE LABOR? °False Labor °· The contractions of false labor are usually shorter and not as hard as those of true labor.   °· The contractions are usually irregular.   °· The contractions are often felt in the front of the lower abdomen and in the groin.   °· The contractions may go away when you walk around or change positions while lying down.   °· The contractions get weaker and are shorter lasting as time goes on.   °· The contractions do not usually become progressively stronger, regular, and closer together as with true labor.   °True Labor °· Contractions in true labor last 30-70 seconds, become very regular, usually become more intense, and increase in frequency.   °· The contractions do not go away with walking.   °· The discomfort is usually felt in the top of the uterus and spreads to the lower abdomen and low back.   °· True labor can be  determined by your health care provider with an exam. This will show that the cervix is dilating and getting thinner.   °WHAT TO REMEMBER °· Keep up with your usual exercises and follow other instructions given by your health care provider.   °· Take medicines as directed by your health care provider.   °· Keep your regular prenatal appointments.   °· Eat and drink lightly if you think you are going into labor.   °· If Braxton Hicks contractions are making you uncomfortable:   °¨ Change your position from lying down or resting to walking, or from walking to resting.   °¨ Sit and rest in a tub of warm water.   °¨ Drink 2-3 glasses of water. Dehydration may cause these contractions.   °¨ Do slow and deep breathing several times an hour.   °WHEN SHOULD I SEEK IMMEDIATE MEDICAL CARE? °Seek immediate medical care if: °· Your contractions become stronger, more regular, and closer together.   °· You have fluid leaking or gushing from your vagina.   °· You have a fever.   °· You pass blood-tinged mucus.   °· You have vaginal bleeding.   °· You have continuous abdominal pain.   °· You have low back pain that you never had before.   °· You feel your baby's head pushing down and causing pelvic pressure.   °· Your baby is not moving as much as it used to.   °  °This information is not intended to replace advice given to you by your health care provider. Make sure you discuss any questions you have with your health care   provider.   Document Released: 01/17/2005 Document Revised: 01/22/2013 Document Reviewed: 10/29/2012 Elsevier Interactive Patient Education 2016 Elsevier Inc. Nausea and Vomiting Nausea is a sick feeling that often comes before throwing up (vomiting). Vomiting is a reflex where stomach contents come out of your mouth. Vomiting can cause severe loss of body fluids (dehydration). Children and elderly adults can become dehydrated quickly, especially if they also have diarrhea. Nausea and vomiting are symptoms  of a condition or disease. It is important to find the cause of your symptoms. CAUSES   Direct irritation of the stomach lining. This irritation can result from increased acid production (gastroesophageal reflux disease), infection, food poisoning, taking certain medicines (such as nonsteroidal anti-inflammatory drugs), alcohol use, or tobacco use.  Signals from the brain.These signals could be caused by a headache, heat exposure, an inner ear disturbance, increased pressure in the brain from injury, infection, a tumor, or a concussion, pain, emotional stimulus, or metabolic problems.  An obstruction in the gastrointestinal tract (bowel obstruction).  Illnesses such as diabetes, hepatitis, gallbladder problems, appendicitis, kidney problems, cancer, sepsis, atypical symptoms of a heart attack, or eating disorders.  Medical treatments such as chemotherapy and radiation.  Receiving medicine that makes you sleep (general anesthetic) during surgery. DIAGNOSIS Your caregiver may ask for tests to be done if the problems do not improve after a few days. Tests may also be done if symptoms are severe or if the reason for the nausea and vomiting is not clear. Tests may include:  Urine tests.  Blood tests.  Stool tests.  Cultures (to look for evidence of infection).  X-rays or other imaging studies. Test results can help your caregiver make decisions about treatment or the need for additional tests. TREATMENT You need to stay well hydrated. Drink frequently but in small amounts.You may wish to drink water, sports drinks, clear broth, or eat frozen ice pops or gelatin dessert to help stay hydrated.When you eat, eating slowly may help prevent nausea.There are also some antinausea medicines that may help prevent nausea. HOME CARE INSTRUCTIONS   Take all medicine as directed by your caregiver.  If you do not have an appetite, do not force yourself to eat. However, you must continue to drink  fluids.  If you have an appetite, eat a normal diet unless your caregiver tells you differently.  Eat a variety of complex carbohydrates (rice, wheat, potatoes, bread), lean meats, yogurt, fruits, and vegetables.  Avoid high-fat foods because they are more difficult to digest.  Drink enough water and fluids to keep your urine clear or pale yellow.  If you are dehydrated, ask your caregiver for specific rehydration instructions. Signs of dehydration may include:  Severe thirst.  Dry lips and mouth.  Dizziness.  Dark urine.  Decreasing urine frequency and amount.  Confusion.  Rapid breathing or pulse. SEEK IMMEDIATE MEDICAL CARE IF:   You have blood or brown flecks (like coffee grounds) in your vomit.  You have black or bloody stools.  You have a severe headache or stiff neck.  You are confused.  You have severe abdominal pain.  You have chest pain or trouble breathing.  You do not urinate at least once every 8 hours.  You develop cold or clammy skin.  You continue to vomit for longer than 24 to 48 hours.  You have a fever. MAKE SURE YOU:   Understand these instructions.  Will watch your condition.  Will get help right away if you are not doing well or get worse.  This information is not intended to replace advice given to you by your health care provider. Make sure you discuss any questions you have with your health care provider.   Document Released: 01/17/2005 Document Revised: 04/11/2011 Document Reviewed: 06/16/2010 Elsevier Interactive Patient Education [employer].

## 2015-03-18 NOTE — MAU Note (Signed)
Urine sent to Lab
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
## 2015-03-18 NOTE — MAU Provider Note (Signed)
Chief Complaint:  Nausea and Contractions   First Provider Initiated Contact with Patient 03/18/15 1415     HPI: Catherine Osborne is a 40 y.o. [id number] at [id number] presents to maternity admissions reporting intermittent lower abdominal pain and lethargy. States has mild nausea that comes and goes. Has not eaten or drank all day "because I just don't have the energy".  Was started on Zoloft 50mg  yesterday by Dr Ruthann Cancer for depression.  Her 68 yo son is with her who does most of the talking for her. She reports good fetal movement, denies LOF, vaginal bleeding, vaginal itching/burning, urinary symptoms, h/a, dizziness, diarrhea, constipation or fever/chills.  She denies headache, visual changes.  Hx is remarkable for cervical incompetence with cerclage placed 2.5wks ago.  Dr Ruthann Cancer states he has seen her twice since surgery, though patient states she has not seen him for a postop appointment.    Mental Health Problem The primary symptoms include dysphoric mood. The primary symptoms do not include delusions, hallucinations, bizarre behavior or disorganized speech. The current episode started more than 2 weeks ago.  The onset of the illness is precipitated by emotional stress. The degree of incapacity that she is experiencing as a consequence of her illness is moderate. Sequelae of the illness include homelessness and an inability to care for self (states does not feel like getting up, but does drive and care for son). Additional symptoms of the illness include anhedonia, fatigue, psychomotor retardation and abdominal pain. Additional symptoms of the illness do not include no agitation or no headaches. She does not admit to suicidal ideas. She has not already injured self. She does not contemplate injuring another person.  Abdominal Pain This is a recurrent problem. The current episode started yesterday. The onset quality is gradual. The problem occurs intermittently. The problem has been unchanged. The  pain is located in the suprapubic region, LLQ and RLQ. The pain is mild. The quality of the pain is cramping. The abdominal pain does not radiate. Associated symptoms include nausea. Pertinent negatives include no diarrhea, dysuria, fever, headaches, myalgias or vomiting. Nothing aggravates the pain. The pain is relieved by nothing. She has tried nothing for the symptoms.   RN Note:  Expand All Collapse All   Pt presents to MAU with complaints of feeling weak. PT states that she took a pill for depression last night and has not felt good since then. Reports lower abdominal pain. Pt also states that she had a cerclage placed approximately one month ago.           Past Medical History: Past Medical History  Diagnosis Date  . Asthma   . Pneumonia   . Allergy     Past obstetric history: OB History  Gravida Para Term Preterm AB SAB TAB Ectopic Multiple Living  14 1  1 12 10 2   0 1    # Outcome Date GA Lbr Len/2nd Weight Sex Delivery Anes PTL Lv  14 Current           13 Preterm  [redacted]w[redacted]d    Vag-Spont     12 SAB           11 SAB           10 SAB           9 SAB           8 SAB           7 SAB  6 SAB           5 SAB           4 SAB           3 TAB           2 TAB           1 SAB               Past Surgical History: Past Surgical History  Procedure Laterality Date  . Dnc    . Foot surgery    . Dilation and evacuation N/A 05/22/2012    Procedure: DILATATION AND EVACUATION;  Surgeon: Frederico Hamman, MD;  Location: [facility];  Service: Gynecology;  Laterality: N/A;  . Cervical cerclage N/A 02/26/2015    Procedure: CERCLAGE CERVICAL;  Surgeon: Frederico Hamman, MD;  Location: [facility];  Service: Gynecology;  Laterality: N/A;    Family History: Family History  Problem Relation Age of Onset  . Bone cancer Mother   . Breast cancer Maternal Grandmother   . Diabetes    . Hypertension    . Stroke    . Heart attack      Social History: Social History  Substance  Use Topics  . Smoking status: Former Research scientist (life sciences)  . Smokeless tobacco: None  . Alcohol Use: No    Allergies: No Known Allergies  Meds:  Prescriptions prior to admission  Medication Sig Dispense Refill Last Dose  . NIFEdipine (PROCARDIA XL/ADALAT-CC) 60 MG 24 hr tablet Take 60 mg by mouth daily.   03/17/2015 at Unknown time  . Prenatal Vit-Fe Fumarate-FA (PRENATAL MULTIVITAMIN) TABS tablet Take 1 tablet by mouth daily at 12 noon.   03/17/2015 at Unknown time  . progesterone (PROMETRIUM) 200 MG capsule Place 1 capsule (200 mg total) vaginally daily. 30 capsule 2 03/17/2015 at Unknown time    I have reviewed patient's Past Medical Hx, Surgical Hx, Family Hx, Social Hx, medications and allergies.   ROS:  Review of Systems  Constitutional: Positive for fatigue. Negative for fever.  Gastrointestinal: Positive for nausea and abdominal pain. Negative for vomiting and diarrhea.  Genitourinary: Negative for dysuria.  Musculoskeletal: Negative for myalgias.  Neurological: Negative for headaches.  Psychiatric/Behavioral: Positive for dysphoric mood. Negative for hallucinations and agitation.     Physical Exam  Patient Vitals for the past 24 hrs:  BP Temp Pulse Resp  03/18/15 1344 115/72 mmHg 98.5 F (36.9 C) 71 18   Constitutional: Well-developed, well-nourished female in no acute distress.  Cardiovascular: normal rate and rhythm Respiratory: normal effort, clear to auscultation bilaterally GI: Abd soft, non-tender, gravid appropriate for gestational age.   No rebound or guarding. MS: Extremities nontender, no edema, normal ROM Neurologic: Alert and oriented x 4.  GU: Neg CVAT.  PELVIC EXAM: Cervix firm, posterior, neg CMT, uterus nontender, Fundal Height consistent with dates, adnexa without tenderness, enlargement, or mass  Cervix effaced, stitch in place, no bleeding, cervix closed    FHT:  Baseline 135 , moderate variability, accelerations present, no decelerations Contractions:   Irregular at beginning of tracing then stopped   Labs: No results found for this or any previous visit (from the past 24 hour(s)). --/--/B POS (01/25 1535)  Imaging:  [place] Mfm Ob Transvaginal  02/24/2015  OBSTETRICAL ULTRASOUND: This exam was performed within a [facility]. The OB US report was generated in the [facility] system, and faxed to the ordering physician.  This report is available in the [facility].  See the [facility] Obstetric US report via the Image Link.   MAU Course/MDM: Cervix examined and is closed/stable NST reviewed Consult Dr Ruthann Cancer with presentation, exam findings and test results.  Social work consulted to see if they can assist her with living arrangements.  Treatments in MAU included IV hydration. Still felt bad after first bag of D5LR + MVI so a second bag of LR hung. WIll discharge home after infusion. .    Assessment: SIUP at [redacted]w[redacted]d  Malaise and mild dehydration, corrected with IV rehydration Depression, on Zoloft  Plan: Discharge home Rx given for Zofran Labor precautions and fetal kick counts Follow up in Office for prenatal visits and recheck    Medication List    ASK your doctor about these medications        NIFEdipine 60 MG 24 hr tablet  Commonly known as:  PROCARDIA XL/ADALAT-CC  Take 60 mg by mouth daily.     prenatal multivitamin Tabs tablet  Take 1 tablet by mouth daily at 12 noon.     progesterone 200 MG capsule  Commonly known as:  PROMETRIUM  Place 1 capsule (200 mg total) vaginally daily.       Pt stable at time of discharge. Encouraged to return here if she develops worsening of symptoms, increase in pain, fever, or other concerning symptoms.      Hansel Feinstein CNM, MSN Certified Nurse-Midwife 03/18/2015 4:00 PM

## 2015-03-18 NOTE — MAU Note (Signed)
Pt presents to MAU with complaints of feeling weak. PT states that she took a pill for depression last night and has not felt good since then. Reports lower abdominal pain. Pt also states that she had a cerclage placed approximately one month ago.

## 2015-03-24 ENCOUNTER — Encounter (HOSPITAL_COMMUNITY): Payer: Self-pay

## 2015-03-24 ENCOUNTER — Ambulatory Visit (HOSPITAL_COMMUNITY)
Admission: RE | Admit: 2015-03-24 | Discharge: 2015-03-24 | Disposition: A | Discharge status: Home / Self Care | Payer: Medicaid Other | Source: Ambulatory Visit | Attending: Obstetrics | Admitting: Obstetrics

## 2015-03-24 ENCOUNTER — Other Ambulatory Visit (HOSPITAL_COMMUNITY): Payer: Self-pay | Admitting: Maternal and Fetal Medicine

## 2015-03-24 VITALS — BP 130/67 | HR 82 | Wt 172.0 lb

## 2015-03-24 DIAGNOSIS — Z3A26 26 weeks gestation of pregnancy: Secondary | ICD-10-CM

## 2015-03-24 DIAGNOSIS — O09523 Supervision of elderly multigravida, third trimester: Secondary | ICD-10-CM

## 2015-03-24 DIAGNOSIS — O09522 Supervision of elderly multigravida, second trimester: Secondary | ICD-10-CM | POA: Insufficient documentation

## 2015-03-24 DIAGNOSIS — O26872 Cervical shortening, second trimester: Secondary | ICD-10-CM

## 2015-03-24 DIAGNOSIS — O09212 Supervision of pregnancy with history of pre-term labor, second trimester: Secondary | ICD-10-CM | POA: Diagnosis not present

## 2015-03-24 DIAGNOSIS — O99322 Drug use complicating pregnancy, second trimester: Secondary | ICD-10-CM

## 2015-03-24 DIAGNOSIS — O3432 Maternal care for cervical incompetence, second trimester: Secondary | ICD-10-CM

## 2015-03-24 DIAGNOSIS — O43122 Velamentous insertion of umbilical cord, second trimester: Secondary | ICD-10-CM

## 2015-03-24 DIAGNOSIS — J45909 Unspecified asthma, uncomplicated: Secondary | ICD-10-CM

## 2015-03-24 DIAGNOSIS — O99519 Diseases of the respiratory system complicating pregnancy, unspecified trimester: Secondary | ICD-10-CM

## 2015-03-24 DIAGNOSIS — Z8751 Personal history of pre-term labor: Secondary | ICD-10-CM

## 2015-04-06 ENCOUNTER — Inpatient Hospital Stay (HOSPITAL_COMMUNITY)
Admission: AD | Admit: 2015-04-06 | Discharge: 2015-04-06 | Disposition: A | Discharge status: Home / Self Care | Payer: Medicaid Other | Source: Ambulatory Visit | Attending: Obstetrics | Admitting: Obstetrics

## 2015-04-06 ENCOUNTER — Encounter (HOSPITAL_COMMUNITY): Payer: Self-pay | Admitting: *Deleted

## 2015-04-06 DIAGNOSIS — O26892 Other specified pregnancy related conditions, second trimester: Secondary | ICD-10-CM

## 2015-04-06 DIAGNOSIS — R1030 Lower abdominal pain, unspecified: Secondary | ICD-10-CM | POA: Insufficient documentation

## 2015-04-06 DIAGNOSIS — Z3A27 27 weeks gestation of pregnancy: Secondary | ICD-10-CM | POA: Diagnosis not present

## 2015-04-06 DIAGNOSIS — Z87891 Personal history of nicotine dependence: Secondary | ICD-10-CM | POA: Insufficient documentation

## 2015-04-06 DIAGNOSIS — O26899 Other specified pregnancy related conditions, unspecified trimester: Secondary | ICD-10-CM

## 2015-04-06 DIAGNOSIS — R12 Heartburn: Secondary | ICD-10-CM | POA: Insufficient documentation

## 2015-04-06 DIAGNOSIS — R109 Unspecified abdominal pain: Secondary | ICD-10-CM

## 2015-04-06 DIAGNOSIS — O99612 Diseases of the digestive system complicating pregnancy, second trimester: Secondary | ICD-10-CM

## 2015-04-06 DIAGNOSIS — N898 Other specified noninflammatory disorders of vagina: Secondary | ICD-10-CM | POA: Insufficient documentation

## 2015-04-06 DIAGNOSIS — K5900 Constipation, unspecified: Secondary | ICD-10-CM | POA: Diagnosis not present

## 2015-04-06 LAB — WET PREP, GENITAL
Clue Cells Wet Prep HPF POC: NONE SEEN
Sperm: NONE SEEN
Trich, Wet Prep: NONE SEEN
Yeast Wet Prep HPF POC: NONE SEEN

## 2015-04-06 LAB — URINALYSIS, ROUTINE W REFLEX MICROSCOPIC
BILIRUBIN URINE: NEGATIVE
Glucose, UA: NEGATIVE mg/dL
Hgb urine dipstick: NEGATIVE
KETONES UR: NEGATIVE mg/dL
LEUKOCYTES UA: NEGATIVE
NITRITE: NEGATIVE
PROTEIN: NEGATIVE mg/dL
Specific Gravity, Urine: 1.02 (ref 1.005–1.030)
pH: 8 (ref 5.0–8.0)

## 2015-04-06 LAB — CBC
HCT: 36.5 % (ref 36.0–46.0)
HEMOGLOBIN: 12.5 g/dL (ref 12.0–15.0)
MCH: 29.3 pg (ref 26.0–34.0)
MCHC: 34.2 g/dL (ref 30.0–36.0)
MCV: 85.7 fL (ref 78.0–100.0)
Platelets: 259 10*3/uL (ref 150–400)
RBC: 4.26 MIL/uL (ref 3.87–5.11)
RDW: 13.7 % (ref 11.5–15.5)
WBC: 10.5 10*3/uL (ref 4.0–10.5)

## 2015-04-06 LAB — COMPREHENSIVE METABOLIC PANEL
ALBUMIN: 2.7 g/dL — AB (ref 3.5–5.0)
ALT: 20 U/L (ref 14–54)
AST: 25 U/L (ref 15–41)
Alkaline Phosphatase: 60 U/L (ref 38–126)
Anion gap: 6 (ref 5–15)
BILIRUBIN TOTAL: 0.4 mg/dL (ref 0.3–1.2)
BUN: 7 mg/dL (ref 6–20)
CHLORIDE: 104 mmol/L (ref 101–111)
CO2: 22 mmol/L (ref 22–32)
CREATININE: 0.47 mg/dL (ref 0.44–1.00)
Calcium: 8.9 mg/dL (ref 8.9–10.3)
GFR calc Af Amer: >60 mL/min (ref >60)
GLUCOSE: 69 mg/dL (ref 65–99)
Potassium: 4.1 mmol/L (ref 3.5–5.1)
Sodium: 132 mmol/L — ABNORMAL LOW (ref 135–145)
Total Protein: 6.5 g/dL (ref 6.5–8.1)

## 2015-04-06 LAB — FETAL FIBRONECTIN: Fetal Fibronectin: NEGATIVE

## 2015-04-06 LAB — LIPASE, BLOOD: LIPASE: 29 U/L (ref 11–51)

## 2015-04-06 LAB — AMYLASE: AMYLASE: 251 U/L — AB (ref 28–100)

## 2015-04-06 MED ORDER — METOCLOPRAMIDE HCL 10 MG PO TABS
10.0000 mg | ORAL_TABLET | Freq: Once | ORAL | Status: AC
  Administered 2015-04-06: 10 mg via ORAL
  Filled 2015-04-06: qty 1

## 2015-04-06 MED ORDER — LACTATED RINGERS IV BOLUS (SEPSIS)
1000.0000 mL | Freq: Once | INTRAVENOUS | Status: AC
  Administered 2015-04-06: 1000 mL via INTRAVENOUS

## 2015-04-06 MED ORDER — RANITIDINE HCL 75 MG PO TABS: 75.0000 mg | ORAL_TABLET | Freq: Two times a day (BID) | ORAL | Status: DC

## 2015-04-06 NOTE — MAU Note (Signed)
Patient states she has been feeling "nauseated, weak, I have a runny nose, and I have felt warm.  Last night I started having abdominal pain."  She denies having any pain at this time.  Denies vomiting and diarrhea or vaginal bleeding.  Reports good fetal movement.  Patient states she is concerned she might have eaten food that had gone bad.

## 2015-04-06 NOTE — Discharge Instructions (Signed)
Constipation, Adult Constipation is when a person has fewer than three bowel movements a week, has difficulty having a bowel movement, or has stools that are dry, hard, or larger than normal. As people grow older, constipation is more common. A low-fiber diet, not taking in enough fluids, and taking certain medicines may make constipation worse.  CAUSES   Certain medicines, such as antidepressants, pain medicine, iron supplements, antacids, and water pills.   Certain diseases, such as diabetes, irritable bowel syndrome (IBS), thyroid disease, or depression.   Not drinking enough water.   Not eating enough fiber-rich foods.   Stress or travel.   Lack of physical activity or exercise.   Ignoring the urge to have a bowel movement.   Using laxatives too much.  SIGNS AND SYMPTOMS   Having fewer than three bowel movements a week.   Straining to have a bowel movement.   Having stools that are hard, dry, or larger than normal.   Feeling full or bloated.   Pain in the lower abdomen.   Not feeling relief after having a bowel movement.  DIAGNOSIS  Your health care provider will take a medical history and perform a physical exam. Further testing may be done for severe constipation. Some tests may include:  A barium enema X-ray to examine your rectum, colon, and, sometimes, your small intestine.   A sigmoidoscopy to examine your lower colon.   A colonoscopy to examine your entire colon. TREATMENT  Treatment will depend on the severity of your constipation and what is causing it. Some dietary treatments include drinking more fluids and eating more fiber-rich foods. Lifestyle treatments may include regular exercise. If these diet and lifestyle recommendations do not help, your health care provider may recommend taking over-the-counter laxative medicines to help you have bowel movements. Prescription medicines may be prescribed if over-the-counter medicines do not work.    HOME CARE INSTRUCTIONS   Eat foods that have a lot of fiber, such as fruits, vegetables, whole grains, and beans.  Limit foods high in fat and processed sugars, such as french fries, hamburgers, cookies, candies, and soda.   A fiber supplement may be added to your diet if you cannot get enough fiber from foods.   Drink enough fluids to keep your urine clear or pale yellow.   Exercise regularly or as directed by your health care provider.   Go to the restroom when you have the urge to go. Do not hold it.   Only take over-the-counter or prescription medicines as directed by your health care provider. Do not take other medicines for constipation without talking to your health care provider first.  [place] IF:   You have bright red blood in your stool.   Your constipation lasts for more than 4 days or gets worse.   You have abdominal or rectal pain.   You have thin, pencil-like stools.   You have unexplained weight loss. MAKE SURE YOU:   Understand these instructions.  Will watch your condition.  Will get help right away if you are not doing well or get worse.   This information is not intended to replace advice given to you by your health care provider. Make sure you discuss any questions you have with your health care provider.   Document Released: 10/16/2003 Document Revised: 02/07/2014 Document Reviewed: 10/29/2012 Elsevier Interactive Patient Education 2016 Elsevier Inc. Heartburn During Pregnancy Heartburn is a burning sensation in the chest caused by stomach acid backing up into the esophagus.  Heartburn is common in pregnancy because a certain hormone (progesterone) is released when a woman is pregnant. The progesterone hormone may relax the valve that separates the esophagus from the stomach. This allows acid to go up into the esophagus, causing heartburn. Heartburn may also happen in pregnancy because the enlarging uterus pushes up on the  stomach, which pushes more acid into the esophagus. This is especially true in the later stages of pregnancy. Heartburn problems usually go away after giving birth. CAUSES  Heartburn is caused by stomach acid backing up into the esophagus. During pregnancy, this may result from various things, including:   The progesterone hormone.  Changing hormone levels.  The growing uterus pushing stomach acid upward.  Large meals.  Certain foods and drinks.  Exercise.  Increased acid production. SIGNS AND SYMPTOMS   Burning pain in the chest or lower throat.  Bitter taste in the mouth.  Coughing. DIAGNOSIS  Your health care provider will typically diagnose heartburn by taking a careful history of your concern. Blood tests may be done to check for a certain type of bacteria that is associated with heartburn. Sometimes, heartburn is diagnosed by prescribing a heartburn medicine to see if the symptoms improve. In some cases, a procedure called an endoscopy may be done. In this procedure, a tube with a light and a camera on the end (endoscope) is used to examine the esophagus and the stomach. TREATMENT  Treatment will vary depending on the severity of your symptoms. Your health care provider may recommend:  Over-the-counter medicines (antacids, acid reducers) for mild heartburn.  Prescription medicines to decrease stomach acid or to protect your stomach lining.  Certain changes in your diet.  Elevating the head of your bed by putting blocks under the legs. This helps prevent stomach acid from backing up into the esophagus when you are lying down. HOME CARE INSTRUCTIONS   Only take over-the-counter or prescription medicines as directed by your health care provider.  Raise the head of your bed by putting blocks under the legs if instructed to do so by your health care provider. Sleeping with more pillows is not effective because it only changes the position of your head.  Do not exercise  right after eating.  Avoid eating 2-3 hours before bed. Do not lie down right after eating.  Eat small meals throughout the day instead of three large meals.  Identify foods and beverages that make your symptoms worse and avoid them. Foods you may want to avoid include:  Peppers.  Chocolate.  High-fat foods, including fried foods.  Spicy foods.  Garlic and onions.  Citrus fruits, including oranges, grapefruit, lemons, and limes.  Food containing tomatoes or tomato products.  Mint.  Carbonated and caffeinated drinks.  Vinegar. SEEK MEDICAL CARE IF:  You have abdominal pain of any kind.  You feel burning in your upper abdomen or chest, especially after eating or lying down.  You have nausea and vomiting.  Your stomach feels upset after you eat. SEEK IMMEDIATE MEDICAL CARE IF:   You have severe chest pain that goes down your arm or into your jaw or neck.  You feel sweaty, dizzy, or light-headed.  You become short of breath.  You vomit blood.  You have difficulty or pain with swallowing.  You have bloody or black, tarry stools.  You have episodes of heartburn more than 3 times a week, for more than 2 weeks. MAKE SURE YOU:  Understand these instructions.  Will watch your condition.  Will  get help right away if you are not doing well or get worse.   This information is not intended to replace advice given to you by your health care provider. Make sure you discuss any questions you have with your health care provider.   Document Released: 01/15/2000 Document Revised: 02/07/2014 Document Reviewed: 09/05/2012 Elsevier Interactive Patient Education 2016 [employer]. Preterm Labor Information Preterm labor is when labor starts at less than 37 weeks of pregnancy. The normal length of a pregnancy is 39 to 41 weeks. CAUSES Often, there is no identifiable underlying cause as to why a woman goes into preterm labor. One of the most common known causes of preterm  labor is infection. Infections of the uterus, cervix, vagina, amniotic sac, bladder, kidney, or even the lungs (pneumonia) can cause labor to start. Other suspected causes of preterm labor include:   Urogenital infections, such as yeast infections and bacterial vaginosis.   Uterine abnormalities (uterine shape, uterine septum, fibroids, or bleeding from the placenta).   A cervix that has been operated on (it may fail to stay closed).   Malformations in the fetus.   Multiple gestations (twins, triplets, and so on).   Breakage of the amniotic sac.  RISK FACTORS  Having a previous history of preterm labor.   Having premature rupture of membranes (PROM).   Having a placenta that covers the opening of the cervix (placenta previa).   Having a placenta that separates from the uterus (placental abruption).   Having a cervix that is too weak to hold the fetus in the uterus (incompetent cervix).   Having too much fluid in the amniotic sac (polyhydramnios).   Taking illegal drugs or smoking while pregnant.   Not gaining enough weight while pregnant.   Being younger than 48 and older than 39 years old.   Having a low socioeconomic status.   Being African American. SYMPTOMS Signs and symptoms of preterm labor include:   Menstrual-like cramps, abdominal pain, or back pain.  Uterine contractions that are regular, as frequent as six in an hour, regardless of their intensity (may be mild or painful).  Contractions that start on the top of the uterus and spread down to the lower abdomen and back.   A sense of increased pelvic pressure.   A watery or bloody mucus discharge that comes from the vagina.  TREATMENT Depending on the length of the pregnancy and other circumstances, your health care provider may suggest bed rest. If necessary, there are medicines that can be given to stop contractions and to mature the fetal lungs. If labor happens before 34 weeks of  pregnancy, a prolonged hospital stay may be recommended. Treatment depends on the condition of both you and the fetus.  WHAT SHOULD YOU DO IF YOU THINK YOU ARE IN PRETERM LABOR? Call your health care provider right away. You will need to go to the hospital to get checked immediately. HOW CAN YOU PREVENT PRETERM LABOR IN FUTURE PREGNANCIES? You should:   Stop smoking if you smoke.  Maintain healthy weight gain and avoid chemicals and drugs that are not necessary.  Be watchful for any type of infection.  Inform your health care provider if you have a known history of preterm labor.   This information is not intended to replace advice given to you by your health care provider. Make sure you discuss any questions you have with your health care provider.   Document Released: 04/09/2003 Document Revised: 09/19/2012 Document Reviewed: 02/20/2012 Elsevier Interactive Patient Education  Education ©2016 Elsevier Inc. ° °

## 2015-04-06 NOTE — MAU Provider Note (Signed)
History     CSN: [id number]  Arrival date and time: 04/06/15 1004   First Provider Initiated Contact with Patient 04/06/15 1043         Chief Complaint  Patient presents with  . Nausea  . Fatigue  . Abdominal Pain   HPI  Catherine Osborne is a 40 y.o. [id number] at [redacted]w[redacted]d who presents for abdominal pain & nausea. Symptoms began yesterday morning. Reports lower abdominal pain that occurred yesterday but has since resolved. Nausea but no vomiting. Points to upper abdomen & states feels like she's going to vomit, but can't tell if area is painful or if it's just nausea.  Has had increased in number of BMs & reports constipation. Had 3 BMs yesterday & today; states hard small stools that look like balls but last BM was large.  Denies LOF or vaginal bleeding. Some increased in vaginal discharge. Has not been using vaginal progesterone in over a week d/t "running out of meds". When asked if she requested a refill for her rx, couldn't determine if she couldn't get a refill or if she couldn't get to the pharmacy to pick it up.  Patient is a poor historian.    OB History    Gravida Para Term Preterm AB TAB SAB Ectopic Multiple Living   14 1  1 12 2 10   0 1      Past Medical History  Diagnosis Date  . Asthma   . Pneumonia   . Allergy     Past Surgical History  Procedure Laterality Date  . Dnc    . Foot surgery    . Dilation and evacuation N/A 05/22/2012    Procedure: DILATATION AND EVACUATION;  Surgeon: Frederico Hamman, MD;  Location: [facility];  Service: Gynecology;  Laterality: N/A;  . Cervical cerclage N/A 02/26/2015    Procedure: CERCLAGE CERVICAL;  Surgeon: Frederico Hamman, MD;  Location: [facility];  Service: Gynecology;  Laterality: N/A;    Family History  Problem Relation Age of Onset  . Bone cancer Mother   . Breast cancer Maternal Grandmother   . Diabetes    . Hypertension    . Stroke    . Heart attack      Social History  Substance Use Topics  . Smoking status:  Former Research scientist (life sciences)  . Smokeless tobacco: None  . Alcohol Use: No    Allergies: No Known Allergies  Prescriptions prior to admission  Medication Sig Dispense Refill Last Dose  . NIFEdipine (PROCARDIA XL/ADALAT-CC) 60 MG 24 hr tablet Take 60 mg by mouth daily.   Taking  . ondansetron (ZOFRAN ODT) 4 MG disintegrating tablet Take 1 tablet (4 mg total) by mouth every 6 (six) hours as needed for nausea. 20 tablet 0 Taking  . Prenatal Vit-Fe Fumarate-FA (PRENATAL MULTIVITAMIN) TABS tablet Take 1 tablet by mouth daily at 12 noon.   Taking  . progesterone (PROMETRIUM) 200 MG capsule Place 1 capsule (200 mg total) vaginally daily. 30 capsule 2 Taking    Review of Systems  Constitutional: Positive for malaise/fatigue. Negative for fever and chills.  Gastrointestinal: Positive for heartburn, nausea, abdominal pain and constipation. Negative for vomiting, diarrhea and blood in stool.  Genitourinary: Negative for dysuria.       + vaginal discharge No vaginal bleeding   Physical Exam   Blood pressure 106/70, pulse 94, temperature 98.5 F (36.9 C), temperature source Oral, resp. rate 18, weight 176 lb 9.6 oz (80.105 kg), last menstrual period 09/27/2014, SpO2 100 %,  unknown if currently breastfeeding.  Physical Exam  Nursing note and vitals reviewed. Constitutional: She is oriented to person, place, and time. She appears well-developed and well-nourished. No distress.  HENT:  Head: Normocephalic and atraumatic.  Eyes: Conjunctivae are normal. Right eye exhibits no discharge. Left eye exhibits no discharge. No scleral icterus.  Neck: Normal range of motion.  Cardiovascular: Normal rate, regular rhythm and normal heart sounds.   No murmur heard. Respiratory: Effort normal and breath sounds normal. No respiratory distress. She has no wheezes.  GI: Soft. Bowel sounds are normal. There is no tenderness.  Genitourinary: No bleeding in the vagina. Vaginal discharge (small amount of thick white discharge)  found.  Dilation: 1 Effacement (%): 70 Exam by:: Jorje Guild, NP  Cerclage visualized & palpated - feels tight  Neurological: She is alert and oriented to person, place, and time.  Skin: Skin is warm and dry. She is not diaphoretic.  Psychiatric: She has a normal mood and affect. Her behavior is normal. Judgment and thought content normal.   Fetal Tracing:  Baseline: 150 Variability: moderate Accelerations: 15x15 Decelerations: none  Toco: UI   MAU Course  Procedures Results for orders placed or performed during the hospital encounter of 04/06/15 (from the past 24 hour(s))  Wet prep, genital     Status: Abnormal   Collection Time: 04/06/15 10:55 AM  Result Value Ref Range   Yeast Wet Prep HPF POC NONE SEEN NONE SEEN   Trich, Wet Prep NONE SEEN NONE SEEN   Clue Cells Wet Prep HPF POC NONE SEEN NONE SEEN   WBC, Wet Prep HPF POC MANY (A) NONE SEEN   Sperm NONE SEEN   Fetal fibronectin     Status: None   Collection Time: 04/06/15 10:55 AM  Result Value Ref Range   Fetal Fibronectin NEGATIVE NEGATIVE  Urinalysis, Routine w reflex microscopic (not at [facility])     Status: None   Collection Time: 04/06/15 10:57 AM  Result Value Ref Range   Color, Urine YELLOW YELLOW   APPearance CLEAR CLEAR   Specific Gravity, Urine 1.020 1.005 - 1.030   pH 8.0 5.0 - 8.0   Glucose, UA NEGATIVE NEGATIVE mg/dL   Hgb urine dipstick NEGATIVE NEGATIVE   Bilirubin Urine NEGATIVE NEGATIVE   Ketones, ur NEGATIVE NEGATIVE mg/dL   Protein, ur NEGATIVE NEGATIVE mg/dL   Nitrite NEGATIVE NEGATIVE   Leukocytes, UA NEGATIVE NEGATIVE  CBC     Status: None   Collection Time: 04/06/15 11:20 AM  Result Value Ref Range   WBC 10.5 4.0 - 10.5 K/uL   RBC 4.26 3.87 - 5.11 MIL/uL   Hemoglobin 12.5 12.0 - 15.0 g/dL   HCT 36.5 36.0 - 46.0 %   MCV 85.7 78.0 - 100.0 fL   MCH 29.3 26.0 - 34.0 pg   MCHC 34.2 30.0 - 36.0 g/dL   RDW 13.7 11.5 - 15.5 %   Platelets 259 150 - 400 K/uL  Amylase     Status: Abnormal    Collection Time: 04/06/15 11:20 AM  Result Value Ref Range   Amylase 251 (H) 28 - 100 U/L  Comprehensive metabolic panel     Status: Abnormal   Collection Time: 04/06/15 11:20 AM  Result Value Ref Range   Sodium 132 (L) 135 - 145 mmol/L   Potassium 4.1 3.5 - 5.1 mmol/L   Chloride 104 101 - 111 mmol/L   CO2 22 22 - 32 mmol/L   Glucose, Bld 69 65 - 99 mg/dL  BUN 7 6 - 20 mg/dL   Creatinine, Ser 0.47 0.44 - 1.00 mg/dL   Calcium 8.9 8.9 - 10.3 mg/dL   Total Protein 6.5 6.5 - 8.1 g/dL   Albumin 2.7 (L) 3.5 - 5.0 g/dL   AST 25 15 - 41 U/L   ALT 20 14 - 54 U/L   Alkaline Phosphatase 60 38 - 126 U/L   Total Bilirubin 0.4 0.3 - 1.2 mg/dL   GFR calc non Af Amer >60 >60 mL/min   GFR calc Af Amer >60 >60 mL/min   Anion gap 6 5 - 15  Lipase, blood     Status: None   Collection Time: 04/06/15 11:20 AM  Result Value Ref Range   Lipase 29 11 - 51 U/L    MDM Category 1 tracing IV LR bolus d/t UI - resolved S/w Dr. Ruthann Cancer. Discussed pt complaint, SVE, & labs. Discharge home with zantac. Discuss heartburn. Pt start taking her progesterone as prescribed.   Assessment and Plan  A: 1. Heartburn during pregnancy in second trimester   2. Abdominal pain in pregnancy   3. Constipation during pregnancy in second trimester    P: Discharge home Rx zantac PTL precautions Increase fiber & water intake; take stool softener Discussed reasons to return  Discussed diet changes for heartburn  Jorje Guild 04/06/2015, 10:39 AM

## 2015-04-27 ENCOUNTER — Inpatient Hospital Stay (HOSPITAL_COMMUNITY)
Admission: AD | Admit: 2015-04-27 | Discharge: 2015-04-27 | Disposition: A | Discharge status: Home / Self Care | Payer: Medicaid Other | Source: Ambulatory Visit | Attending: Obstetrics | Admitting: Obstetrics

## 2015-04-27 ENCOUNTER — Encounter (HOSPITAL_COMMUNITY): Payer: Self-pay | Admitting: *Deleted

## 2015-04-27 DIAGNOSIS — J45909 Unspecified asthma, uncomplicated: Secondary | ICD-10-CM | POA: Diagnosis not present

## 2015-04-27 DIAGNOSIS — O219 Vomiting of pregnancy, unspecified: Secondary | ICD-10-CM | POA: Insufficient documentation

## 2015-04-27 DIAGNOSIS — N898 Other specified noninflammatory disorders of vagina: Secondary | ICD-10-CM | POA: Diagnosis not present

## 2015-04-27 DIAGNOSIS — Z3A30 30 weeks gestation of pregnancy: Secondary | ICD-10-CM | POA: Diagnosis not present

## 2015-04-27 DIAGNOSIS — K219 Gastro-esophageal reflux disease without esophagitis: Secondary | ICD-10-CM | POA: Diagnosis not present

## 2015-04-27 DIAGNOSIS — O99613 Diseases of the digestive system complicating pregnancy, third trimester: Secondary | ICD-10-CM

## 2015-04-27 DIAGNOSIS — R112 Nausea with vomiting, unspecified: Secondary | ICD-10-CM | POA: Diagnosis present

## 2015-04-27 DIAGNOSIS — Z79899 Other long term (current) drug therapy: Secondary | ICD-10-CM | POA: Insufficient documentation

## 2015-04-27 HISTORY — DX: Unspecified abnormal cytological findings in specimens from vagina: R87.629

## 2015-04-27 LAB — URINALYSIS, ROUTINE W REFLEX MICROSCOPIC
BILIRUBIN URINE: NEGATIVE
Glucose, UA: NEGATIVE mg/dL
Hgb urine dipstick: NEGATIVE
KETONES UR: NEGATIVE mg/dL
Leukocytes, UA: NEGATIVE
NITRITE: NEGATIVE
PH: 7.5 (ref 5.0–8.0)
PROTEIN: NEGATIVE mg/dL
Specific Gravity, Urine: 1.015 (ref 1.005–1.030)

## 2015-04-27 MED ORDER — RANITIDINE HCL 150 MG PO CAPS: 150.0000 mg | ORAL_CAPSULE | Freq: Two times a day (BID) | ORAL | Status: DC

## 2015-04-27 MED ORDER — ONDANSETRON 4 MG PO TBDP: 4.0000 mg | ORAL_TABLET | Freq: Three times a day (TID) | ORAL | Status: DC | PRN

## 2015-04-27 MED ORDER — ONDANSETRON 8 MG PO TBDP
8.0000 mg | ORAL_TABLET | Freq: Once | ORAL | Status: AC
  Administered 2015-04-27: 8 mg via ORAL
  Filled 2015-04-27: qty 1

## 2015-04-27 NOTE — MAU Note (Signed)
C/o N&V for past 24 hours; c/o fatigue also and indigestion;

## 2015-04-27 NOTE — MAU Provider Note (Signed)
History     CSN: [id number]  Arrival date and time: 04/27/15 [id number]   First Provider Initiated Contact with Patient 04/27/15 1006      Chief Complaint  Patient presents with  . Fatigue  . Emesis  . Vaginal Discharge   HPI   Catherine Osborne is a 40 y.o. female [id number] at [redacted]w[redacted]d presenting to MAU with fatigue, nausea/vomiting and vaginal discharge.   The nausea and vomiting started yesterday; in the last 24 hours she has vomited 3 times. She has not taken anything for the nausea. She is able to keep fluids down, unable to eat solids. Last time she ate was last night at 10 pm and she ate hamburger helper.   The vaginal discharge started when she found out she was pregnant. Mild odor at times. None currently. She uses vaginal progesterone at night and feels the discharge is from that.   OB History    Gravida Para Term Preterm AB TAB SAB Ectopic Multiple Living   14 1  1 12 2 10   0 1      Past Medical History  Diagnosis Date  . Asthma   . Pneumonia   . Allergy   . Vaginal Pap smear, abnormal     Past Surgical History  Procedure Laterality Date  . Dnc    . Foot surgery    . Dilation and evacuation N/A 05/22/2012    Procedure: DILATATION AND EVACUATION;  Surgeon: Frederico Hamman, MD;  Location: [facility];  Service: Gynecology;  Laterality: N/A;  . Cervical cerclage N/A 02/26/2015    Procedure: CERCLAGE CERVICAL;  Surgeon: Frederico Hamman, MD;  Location: [facility];  Service: Gynecology;  Laterality: N/A;  . Dilation and curettage of uterus      Family History  Problem Relation Age of Onset  . Bone cancer Mother   . Breast cancer Maternal Grandmother   . Diabetes    . Hypertension    . Stroke    . Heart attack      Social History  Substance Use Topics  . Smoking status: Former Research scientist (life sciences)  . Smokeless tobacco: None  . Alcohol Use: No    Allergies: No Known Allergies  Prescriptions prior to admission  Medication Sig Dispense Refill Last Dose  . NIFEdipine  (PROCARDIA XL/ADALAT-CC) 60 MG 24 hr tablet Take 60 mg by mouth daily.   04/26/2015 at Unknown time  . Prenatal Vit-Fe Fumarate-FA (PRENATAL MULTIVITAMIN) TABS tablet Take 1 tablet by mouth at bedtime.    04/26/2015 at Unknown time  . progesterone (PROMETRIUM) 200 MG capsule Place 1 capsule (200 mg total) vaginally daily. 30 capsule 2 04/26/2015 at Unknown time  . sertraline (ZOLOFT) 50 MG tablet Take 50 mg by mouth at bedtime.   04/26/2015 at Unknown time  . ranitidine (ZANTAC) 75 MG tablet Take 1 tablet (75 mg total) by mouth 2 (two) times daily. (Patient not taking: Reported on 04/27/2015) 60 tablet 0 Not Taking at Unknown time   Results for orders placed or performed during the hospital encounter of 04/27/15 (from the past 48 hour(s))  Urinalysis, Routine w reflex microscopic (not at [facility])     Status: None   Collection Time: 04/27/15  9:39 AM  Result Value Ref Range   Color, Urine YELLOW YELLOW   APPearance CLEAR CLEAR   Specific Gravity, Urine 1.015 1.005 - 1.030   pH 7.5 5.0 - 8.0   Glucose, UA NEGATIVE NEGATIVE mg/dL   Hgb urine dipstick NEGATIVE NEGATIVE  Bilirubin Urine NEGATIVE NEGATIVE   Ketones, ur NEGATIVE NEGATIVE mg/dL   Protein, ur NEGATIVE NEGATIVE mg/dL   Nitrite NEGATIVE NEGATIVE   Leukocytes, UA NEGATIVE NEGATIVE    Comment: MICROSCOPIC NOT DONE ON URINES WITH NEGATIVE PROTEIN, BLOOD, LEUKOCYTES, NITRITE, OR GLUCOSE <1000 mg/dL.    Review of Systems  Constitutional: Positive for chills. Negative for fever.  HENT: Negative for sore throat.   Respiratory: Negative for cough.   Gastrointestinal: Positive for heartburn, nausea, vomiting and constipation. Negative for abdominal pain and diarrhea.   Physical Exam   Blood pressure 124/72, pulse 83, temperature 99 F (37.2 C), temperature source Oral, resp. rate 18, last menstrual period 09/27/2014, unknown if currently breastfeeding.  Physical Exam  Constitutional: She is oriented to person, place, and time. She appears  well-developed and well-nourished. No distress.  Cardiovascular: Normal rate.   Respiratory: Effort normal.  GI: Soft.  Musculoskeletal: Normal range of motion.  Neurological: She is alert and oriented to person, place, and time.  Skin: Skin is warm. She is not diaphoretic.  Psychiatric: Her behavior is normal.   Fetal Tracing: Baseline: 135 bpm  Variability: Moderate  Accelerations: 15x15 Decelerations: None Toco: quiet.    MAU Course  Procedures  None  MDM UA normal without signs of dehydration. Zofran 8 mg ODT PO challenge: pass. Patient drinking and eating ice chips while in MAU.   Assessment and Plan   A:  1. Gastroesophageal reflux disease, esophagitis presence not specified   2. Nausea/vomiting in pregnancy     P:  Discharge home in stable condition RX: Zantac, Zofran.  Follow up with OB as scheduled Return to MAU if symptoms worsen Fetal kick counts Preterm labor precautions.   Lezlie Lye, NP 04/27/2015 6:30 PM

## 2015-04-27 NOTE — Discharge Instructions (Signed)
Food Choices for Gastroesophageal Reflux Disease, Adult When you have gastroesophageal reflux disease (GERD), the foods you eat and your eating habits are very important. Choosing the right foods can help ease the discomfort of GERD. WHAT GENERAL GUIDELINES DO I NEED TO FOLLOW?  Choose fruits, vegetables, whole grains, low-fat dairy products, and low-fat meat, fish, and poultry.  Limit fats such as oils, salad dressings, butter, nuts, and avocado.  Keep a food diary to identify foods that cause symptoms.  Avoid foods that cause reflux. These may be different for different people.  Eat frequent small meals instead of three large meals each day.  Eat your meals slowly, in a relaxed setting.  Limit fried foods.  Cook foods using methods other than frying.  Avoid drinking alcohol.  Avoid drinking large amounts of liquids with your meals.  Avoid bending over or lying down until 2-3 hours after eating. WHAT FOODS ARE NOT RECOMMENDED? The following are some foods and drinks that may worsen your symptoms: Vegetables Tomatoes. Tomato juice. Tomato and spaghetti sauce. Chili peppers. Onion and garlic. Horseradish. Fruits Oranges, grapefruit, and lemon (fruit and juice). Meats High-fat meats, fish, and poultry. This includes hot dogs, ribs, ham, sausage, salami, and bacon. Dairy Whole milk and chocolate milk. Sour cream. Cream. Butter. Ice cream. Cream cheese.  Beverages Coffee and tea, with or without caffeine. Carbonated beverages or energy drinks. Condiments Hot sauce. Barbecue sauce.  Sweets/Desserts Chocolate and cocoa. Donuts. Peppermint and spearmint. Fats and Oils High-fat foods, including French fries and potato chips. Other Vinegar. Strong spices, such as black pepper, white pepper, red pepper, cayenne, curry powder, cloves, ginger, and chili powder. The items listed above may not be a complete list of foods and beverages to avoid. Contact your dietitian for more  information.   This information is not intended to replace advice given to you by your health care provider. Make sure you discuss any questions you have with your health care provider.   Document Released: 01/17/2005 Document Revised: 02/07/2014 Document Reviewed: 11/21/2012 Elsevier Interactive Patient Education 2016 Elsevier Inc.  

## 2015-05-05 ENCOUNTER — Other Ambulatory Visit (HOSPITAL_COMMUNITY): Payer: Self-pay | Admitting: Maternal and Fetal Medicine

## 2015-05-05 ENCOUNTER — Ambulatory Visit (HOSPITAL_COMMUNITY)
Admission: RE | Admit: 2015-05-05 | Discharge: 2015-05-05 | Disposition: A | Discharge status: Home / Self Care | Payer: Medicaid Other | Source: Ambulatory Visit | Attending: Obstetrics | Admitting: Obstetrics

## 2015-05-05 ENCOUNTER — Encounter (HOSPITAL_COMMUNITY): Payer: Self-pay

## 2015-05-05 ENCOUNTER — Other Ambulatory Visit (HOSPITAL_COMMUNITY): Payer: Self-pay | Admitting: *Deleted

## 2015-05-05 DIAGNOSIS — O09213 Supervision of pregnancy with history of pre-term labor, third trimester: Secondary | ICD-10-CM | POA: Diagnosis not present

## 2015-05-05 DIAGNOSIS — O99513 Diseases of the respiratory system complicating pregnancy, third trimester: Secondary | ICD-10-CM | POA: Insufficient documentation

## 2015-05-05 DIAGNOSIS — J45909 Unspecified asthma, uncomplicated: Secondary | ICD-10-CM | POA: Diagnosis not present

## 2015-05-05 DIAGNOSIS — Z3A32 32 weeks gestation of pregnancy: Secondary | ICD-10-CM | POA: Insufficient documentation

## 2015-05-05 DIAGNOSIS — O09523 Supervision of elderly multigravida, third trimester: Secondary | ICD-10-CM | POA: Diagnosis not present

## 2015-05-05 DIAGNOSIS — O26873 Cervical shortening, third trimester: Secondary | ICD-10-CM | POA: Diagnosis not present

## 2015-05-05 DIAGNOSIS — O43122 Velamentous insertion of umbilical cord, second trimester: Secondary | ICD-10-CM

## 2015-05-05 DIAGNOSIS — O403XX0 Polyhydramnios, third trimester, not applicable or unspecified: Secondary | ICD-10-CM

## 2015-05-05 DIAGNOSIS — O3433 Maternal care for cervical incompetence, third trimester: Secondary | ICD-10-CM | POA: Insufficient documentation

## 2015-05-13 ENCOUNTER — Inpatient Hospital Stay (HOSPITAL_COMMUNITY)
Admission: AD | Admit: 2015-05-13 | Discharge: 2015-05-13 | Disposition: A | Discharge status: Home / Self Care | Payer: Medicaid Other | Source: Ambulatory Visit | Attending: Obstetrics | Admitting: Obstetrics

## 2015-05-13 ENCOUNTER — Encounter (HOSPITAL_COMMUNITY): Payer: Self-pay

## 2015-05-13 ENCOUNTER — Ambulatory Visit (HOSPITAL_COMMUNITY)
Admission: RE | Admit: 2015-05-13 | Discharge: 2015-05-13 | Disposition: A | Discharge status: Home / Self Care | Payer: Medicaid Other | Source: Ambulatory Visit | Attending: Obstetrics | Admitting: Obstetrics

## 2015-05-13 ENCOUNTER — Encounter (HOSPITAL_COMMUNITY): Payer: Self-pay | Admitting: *Deleted

## 2015-05-13 VITALS — BP 120/75 | HR 91 | Wt 187.0 lb

## 2015-05-13 DIAGNOSIS — J45909 Unspecified asthma, uncomplicated: Secondary | ICD-10-CM | POA: Insufficient documentation

## 2015-05-13 DIAGNOSIS — Z3A33 33 weeks gestation of pregnancy: Secondary | ICD-10-CM | POA: Diagnosis not present

## 2015-05-13 DIAGNOSIS — Z87891 Personal history of nicotine dependence: Secondary | ICD-10-CM | POA: Diagnosis not present

## 2015-05-13 DIAGNOSIS — O09523 Supervision of elderly multigravida, third trimester: Secondary | ICD-10-CM | POA: Insufficient documentation

## 2015-05-13 DIAGNOSIS — R109 Unspecified abdominal pain: Secondary | ICD-10-CM | POA: Diagnosis present

## 2015-05-13 DIAGNOSIS — O2622 Pregnancy care for patient with recurrent pregnancy loss, second trimester: Secondary | ICD-10-CM | POA: Insufficient documentation

## 2015-05-13 DIAGNOSIS — R102 Pelvic and perineal pain: Secondary | ICD-10-CM

## 2015-05-13 DIAGNOSIS — O26892 Other specified pregnancy related conditions, second trimester: Secondary | ICD-10-CM | POA: Insufficient documentation

## 2015-05-13 DIAGNOSIS — O403XX0 Polyhydramnios, third trimester, not applicable or unspecified: Secondary | ICD-10-CM

## 2015-05-13 DIAGNOSIS — M542 Cervicalgia: Secondary | ICD-10-CM | POA: Insufficient documentation

## 2015-05-13 DIAGNOSIS — O26899 Other specified pregnancy related conditions, unspecified trimester: Secondary | ICD-10-CM

## 2015-05-13 DIAGNOSIS — O26873 Cervical shortening, third trimester: Secondary | ICD-10-CM | POA: Insufficient documentation

## 2015-05-13 DIAGNOSIS — O99323 Drug use complicating pregnancy, third trimester: Secondary | ICD-10-CM | POA: Insufficient documentation

## 2015-05-13 DIAGNOSIS — O3433 Maternal care for cervical incompetence, third trimester: Secondary | ICD-10-CM | POA: Diagnosis present

## 2015-05-13 DIAGNOSIS — N949 Unspecified condition associated with female genital organs and menstrual cycle: Secondary | ICD-10-CM | POA: Diagnosis not present

## 2015-05-13 DIAGNOSIS — O09522 Supervision of elderly multigravida, second trimester: Secondary | ICD-10-CM

## 2015-05-13 DIAGNOSIS — O43123 Velamentous insertion of umbilical cord, third trimester: Secondary | ICD-10-CM | POA: Diagnosis not present

## 2015-05-13 DIAGNOSIS — R1011 Right upper quadrant pain: Secondary | ICD-10-CM

## 2015-05-13 DIAGNOSIS — O09213 Supervision of pregnancy with history of pre-term labor, third trimester: Secondary | ICD-10-CM | POA: Diagnosis not present

## 2015-05-13 DIAGNOSIS — O99513 Diseases of the respiratory system complicating pregnancy, third trimester: Secondary | ICD-10-CM | POA: Insufficient documentation

## 2015-05-13 DIAGNOSIS — O9989 Other specified diseases and conditions complicating pregnancy, childbirth and the puerperium: Secondary | ICD-10-CM | POA: Diagnosis not present

## 2015-05-13 HISTORY — DX: Headache, unspecified: R51.9

## 2015-05-13 HISTORY — DX: Supervision of pregnancy with other poor reproductive or obstetric history, first trimester: O09.291

## 2015-05-13 HISTORY — DX: Headache: R51

## 2015-05-13 MED ORDER — CYCLOBENZAPRINE HCL 5 MG PO TABS: 5.0000 mg | ORAL_TABLET | Freq: Three times a day (TID) | ORAL | Status: DC | PRN

## 2015-05-13 MED ORDER — ACETAMINOPHEN 500 MG PO TABS
1000.0000 mg | ORAL_TABLET | Freq: Once | ORAL | Status: AC
Start: 2015-05-13 — End: 2015-05-13
  Administered 2015-05-13: 1000 mg via ORAL
  Filled 2015-05-13: qty 2

## 2015-05-13 NOTE — MAU Note (Signed)
Urine in lab 

## 2015-05-13 NOTE — Discharge Instructions (Signed)

## 2015-05-13 NOTE — MAU Note (Signed)
C/o R flank pain for a month that has progressively gotten worse ;

## 2015-05-13 NOTE — MAU Provider Note (Signed)
History     CSN: [id number]  Arrival date and time: 05/13/15 [id number]   First Provider Initiated Contact with Patient 05/13/15 [phone]      Chief Complaint  Patient presents with  . Abdominal Pain   Abdominal Pain This is a chronic problem. The current episode started more than 1 month ago. The onset quality is gradual. The problem occurs constantly. The problem has been waxing and waning. The pain is located in the RUQ. The pain is at a severity of 7/10. The pain is moderate. The quality of the pain is sharp and tearing. The abdominal pain does not radiate. Associated symptoms include nausea and vomiting. Pertinent negatives include no anorexia, fever or headaches. Associated symptoms comments: Occasional vomiting in am x 2 wks. The pain is aggravated by certain positions, coughing, movement and vomiting (Also worse when "baby lies on her right side"). The pain is relieved by being still and certain positions. She has tried nothing for the symptoms.   Prenatal course: Significant for early pregnancy losses and prophylactic cerclage in place. States Dr. Ruthann Osborne considering diagnostic testing for abd sx.   OB History    Gravida Para Term Preterm AB TAB SAB Ectopic Multiple Living   14 1  1 12 2 10   0 1        Past Medical History  Diagnosis Date  . Asthma   . Pneumonia   . Allergy   . Vaginal Pap smear, abnormal   . Prior miscarriage with pregnancy in first trimester, antepartum   . Headache     Past Surgical History  Procedure Laterality Date  . Dnc    . Foot surgery    . Dilation and evacuation N/A 05/22/2012    Procedure: DILATATION AND EVACUATION;  Surgeon: Catherine Hamman, MD;  Location: [facility];  Service: Gynecology;  Laterality: N/A;  . Cervical cerclage N/A 02/26/2015    Procedure: CERCLAGE CERVICAL;  Surgeon: Catherine Hamman, MD;  Location: [facility];  Service: Gynecology;  Laterality: N/A;  . Dilation and curettage of uterus      Family History  Problem Relation  Age of Onset  . Bone Osborne Mother   . Breast Osborne Maternal Grandmother   . Diabetes    . Hypertension    . Stroke    . Heart attack      Social History  Substance Use Topics  . Smoking status: Former Research scientist (life sciences)  . Smokeless tobacco: None  . Alcohol Use: No    Allergies: No Known Allergies  Prescriptions prior to admission  Medication Sig Dispense Refill Last Dose  . NIFEdipine (PROCARDIA XL/ADALAT-CC) 60 MG 24 hr tablet Take 60 mg by mouth daily.   Taking  . ondansetron (ZOFRAN ODT) 4 MG disintegrating tablet Take 1 tablet (4 mg total) by mouth every 8 (eight) hours as needed for nausea or vomiting. 20 tablet 0 Taking  . Prenatal Vit-Fe Fumarate-FA (PRENATAL MULTIVITAMIN) TABS tablet Take 1 tablet by mouth at bedtime.    Taking  . progesterone (PROMETRIUM) 200 MG capsule Place 1 capsule (200 mg total) vaginally daily. 30 capsule 2 Taking  . ranitidine (ZANTAC) 150 MG capsule Take 1 capsule (150 mg total) by mouth 2 (two) times daily. 30 capsule 0 Taking  . sertraline (ZOLOFT) 50 MG tablet Take 50 mg by mouth at bedtime. Reported on 05/13/2015   Not Taking    Review of Systems  Constitutional: Negative for fever.  Respiratory: Negative for cough.   Cardiovascular: Negative for chest  pain.  Gastrointestinal: Positive for nausea, vomiting and abdominal pain. Negative for anorexia.       N/V throughout pregnancy intermittently  Neurological: Negative for dizziness and headaches.   Physical Exam   Blood pressure 108/67, pulse 94, temperature 98.8 F (37.1 C), temperature source Oral, resp. rate 18, last menstrual period 09/27/2014, unknown if currently breastfeeding.  Physical Exam  Nursing note and vitals reviewed. Constitutional: She is oriented to person, place, and time. She appears well-developed and well-nourished. No distress.  HENT:  Head: Normocephalic.  Eyes: Pupils are equal, round, and reactive to light.  Neck: Normal range of motion.  GI: She exhibits no  distension. There is tenderness.  Mildly tender at costal margin. Mild-mod TTP right lateral upper quadrant. No guarding or rebound  Genitourinary: Vaginal discharge found.  SVE: Cerclage in place; no pressure on cx. Cx short. No blood. Leukorrhea present.  Musculoskeletal: Normal range of motion.  Neurological: She is alert and oriented to person, place, and time.  Skin: Skin is warm and dry.  Psychiatric: She has a normal mood and affect. Her behavior is normal.    MAU Course  Procedures No results found for this or any previous visit (from the past 24 hour(s)). Actaminophen 1000mg  po given> minimal improvement TC to Catherine Osborne office: plan is gallbladder US if pain persists. Pt will call office   Assessment and Plan   1. Advanced maternal age in multigravida, second trimester   2. Pain of round ligament affecting pregnancy, antepartum      Medication List    TAKE these medications        cyclobenzaprine 5 MG tablet  Commonly known as:  FLEXERIL  Take 1 tablet (5 mg total) by mouth 3 (three) times daily as needed for muscle spasms.     NIFEdipine 60 MG 24 hr tablet  Commonly known as:  PROCARDIA XL/ADALAT-CC  Take 60 mg by mouth daily.     ondansetron 4 MG disintegrating tablet  Commonly known as:  ZOFRAN ODT  Take 1 tablet (4 mg total) by mouth every 8 (eight) hours as needed for nausea or vomiting.     prenatal multivitamin Tabs tablet  Take 1 tablet by mouth at bedtime.     progesterone 200 MG capsule  Commonly known as:  PROMETRIUM  Place 1 capsule (200 mg total) vaginally daily.     ranitidine 150 MG capsule  Commonly known as:  ZANTAC  Take 1 capsule (150 mg total) by mouth 2 (two) times daily.     sertraline 50 MG tablet  Commonly known as:  ZOLOFT  Take 50 mg by mouth at bedtime. Reported on 05/13/2015       Follow-up Information    Follow up with Catherine Hamman, MD.   Specialty:  Obstetrics and Gynecology   Why:  Keep your scheduled  prenatal appointment   Contact information:   [address] [place] [address] [phone]       Catherine Osborne 05/13/2015, 9:35 AM

## 2015-05-14 ENCOUNTER — Other Ambulatory Visit (HOSPITAL_COMMUNITY): Payer: Self-pay | Admitting: Obstetrics

## 2015-05-14 DIAGNOSIS — R109 Unspecified abdominal pain: Secondary | ICD-10-CM

## 2015-05-14 DIAGNOSIS — R110 Nausea: Secondary | ICD-10-CM

## 2015-05-14 DIAGNOSIS — Z3483 Encounter for supervision of other normal pregnancy, third trimester: Secondary | ICD-10-CM

## 2015-05-20 ENCOUNTER — Encounter (HOSPITAL_COMMUNITY): Payer: Self-pay

## 2015-05-20 ENCOUNTER — Ambulatory Visit (HOSPITAL_COMMUNITY)
Admission: RE | Admit: 2015-05-20 | Discharge: 2015-05-20 | Disposition: A | Discharge status: Home / Self Care | Payer: Medicaid Other | Source: Ambulatory Visit | Attending: Obstetrics | Admitting: Obstetrics

## 2015-05-20 ENCOUNTER — Other Ambulatory Visit (HOSPITAL_COMMUNITY): Payer: Self-pay | Admitting: Obstetrics and Gynecology

## 2015-05-20 DIAGNOSIS — O43123 Velamentous insertion of umbilical cord, third trimester: Secondary | ICD-10-CM | POA: Diagnosis not present

## 2015-05-20 DIAGNOSIS — O26893 Other specified pregnancy related conditions, third trimester: Secondary | ICD-10-CM | POA: Diagnosis not present

## 2015-05-20 DIAGNOSIS — O09523 Supervision of elderly multigravida, third trimester: Secondary | ICD-10-CM | POA: Insufficient documentation

## 2015-05-20 DIAGNOSIS — O403XX0 Polyhydramnios, third trimester, not applicable or unspecified: Secondary | ICD-10-CM

## 2015-05-20 DIAGNOSIS — O3433 Maternal care for cervical incompetence, third trimester: Secondary | ICD-10-CM | POA: Diagnosis not present

## 2015-05-20 DIAGNOSIS — O99323 Drug use complicating pregnancy, third trimester: Secondary | ICD-10-CM | POA: Insufficient documentation

## 2015-05-20 DIAGNOSIS — Z3A34 34 weeks gestation of pregnancy: Secondary | ICD-10-CM | POA: Diagnosis not present

## 2015-05-20 DIAGNOSIS — J45909 Unspecified asthma, uncomplicated: Secondary | ICD-10-CM | POA: Insufficient documentation

## 2015-05-20 DIAGNOSIS — O26873 Cervical shortening, third trimester: Secondary | ICD-10-CM | POA: Diagnosis not present

## 2015-05-20 DIAGNOSIS — O09213 Supervision of pregnancy with history of pre-term labor, third trimester: Secondary | ICD-10-CM

## 2015-05-20 DIAGNOSIS — O09293 Supervision of pregnancy with other poor reproductive or obstetric history, third trimester: Secondary | ICD-10-CM | POA: Diagnosis not present

## 2015-05-20 DIAGNOSIS — R109 Unspecified abdominal pain: Secondary | ICD-10-CM

## 2015-05-20 DIAGNOSIS — Z3483 Encounter for supervision of other normal pregnancy, third trimester: Secondary | ICD-10-CM

## 2015-05-20 DIAGNOSIS — R110 Nausea: Secondary | ICD-10-CM

## 2015-05-27 ENCOUNTER — Encounter (HOSPITAL_COMMUNITY): Payer: Self-pay

## 2015-05-27 ENCOUNTER — Ambulatory Visit (HOSPITAL_COMMUNITY)
Admission: RE | Admit: 2015-05-27 | Discharge: 2015-05-27 | Disposition: A | Discharge status: Home / Self Care | Payer: Medicaid Other | Source: Ambulatory Visit | Attending: Obstetrics | Admitting: Obstetrics

## 2015-05-27 DIAGNOSIS — O403XX0 Polyhydramnios, third trimester, not applicable or unspecified: Secondary | ICD-10-CM

## 2015-05-27 DIAGNOSIS — O99323 Drug use complicating pregnancy, third trimester: Secondary | ICD-10-CM | POA: Diagnosis not present

## 2015-05-27 DIAGNOSIS — O26873 Cervical shortening, third trimester: Secondary | ICD-10-CM | POA: Diagnosis not present

## 2015-05-27 DIAGNOSIS — O43123 Velamentous insertion of umbilical cord, third trimester: Secondary | ICD-10-CM | POA: Diagnosis not present

## 2015-05-27 DIAGNOSIS — O09213 Supervision of pregnancy with history of pre-term labor, third trimester: Secondary | ICD-10-CM | POA: Insufficient documentation

## 2015-05-27 DIAGNOSIS — O09523 Supervision of elderly multigravida, third trimester: Secondary | ICD-10-CM | POA: Insufficient documentation

## 2015-05-27 DIAGNOSIS — Z3A35 35 weeks gestation of pregnancy: Secondary | ICD-10-CM | POA: Diagnosis not present

## 2015-05-27 DIAGNOSIS — O3433 Maternal care for cervical incompetence, third trimester: Secondary | ICD-10-CM | POA: Diagnosis not present

## 2015-06-03 ENCOUNTER — Encounter (HOSPITAL_COMMUNITY): Payer: Self-pay

## 2015-06-03 ENCOUNTER — Other Ambulatory Visit (HOSPITAL_COMMUNITY): Payer: Self-pay | Admitting: Obstetrics and Gynecology

## 2015-06-03 ENCOUNTER — Inpatient Hospital Stay (HOSPITAL_COMMUNITY)
Admission: AD | Admit: 2015-06-03 | Discharge: 2015-06-03 | Disposition: A | Discharge status: Home / Self Care | Payer: Medicaid Other | Source: Ambulatory Visit | Attending: Obstetrics | Admitting: Obstetrics

## 2015-06-03 ENCOUNTER — Encounter (HOSPITAL_COMMUNITY): Payer: Self-pay | Admitting: *Deleted

## 2015-06-03 ENCOUNTER — Ambulatory Visit (HOSPITAL_COMMUNITY)
Admission: RE | Admit: 2015-06-03 | Discharge: 2015-06-03 | Disposition: A | Discharge status: Home / Self Care | Payer: Medicaid Other | Source: Ambulatory Visit | Attending: Obstetrics | Admitting: Obstetrics

## 2015-06-03 DIAGNOSIS — O09213 Supervision of pregnancy with history of pre-term labor, third trimester: Secondary | ICD-10-CM | POA: Diagnosis not present

## 2015-06-03 DIAGNOSIS — O99519 Diseases of the respiratory system complicating pregnancy, unspecified trimester: Secondary | ICD-10-CM

## 2015-06-03 DIAGNOSIS — O26893 Other specified pregnancy related conditions, third trimester: Secondary | ICD-10-CM | POA: Insufficient documentation

## 2015-06-03 DIAGNOSIS — O9989 Other specified diseases and conditions complicating pregnancy, childbirth and the puerperium: Secondary | ICD-10-CM | POA: Diagnosis not present

## 2015-06-03 DIAGNOSIS — O09523 Supervision of elderly multigravida, third trimester: Secondary | ICD-10-CM | POA: Diagnosis not present

## 2015-06-03 DIAGNOSIS — O403XX0 Polyhydramnios, third trimester, not applicable or unspecified: Secondary | ICD-10-CM | POA: Insufficient documentation

## 2015-06-03 DIAGNOSIS — O99323 Drug use complicating pregnancy, third trimester: Secondary | ICD-10-CM | POA: Insufficient documentation

## 2015-06-03 DIAGNOSIS — O3433 Maternal care for cervical incompetence, third trimester: Secondary | ICD-10-CM | POA: Diagnosis not present

## 2015-06-03 DIAGNOSIS — Z3A36 36 weeks gestation of pregnancy: Secondary | ICD-10-CM | POA: Insufficient documentation

## 2015-06-03 DIAGNOSIS — J45909 Unspecified asthma, uncomplicated: Secondary | ICD-10-CM | POA: Diagnosis not present

## 2015-06-03 DIAGNOSIS — O43123 Velamentous insertion of umbilical cord, third trimester: Secondary | ICD-10-CM | POA: Insufficient documentation

## 2015-06-03 DIAGNOSIS — O26873 Cervical shortening, third trimester: Secondary | ICD-10-CM | POA: Insufficient documentation

## 2015-06-03 LAB — URINALYSIS, ROUTINE W REFLEX MICROSCOPIC
BILIRUBIN URINE: NEGATIVE
GLUCOSE, UA: NEGATIVE mg/dL
HGB URINE DIPSTICK: NEGATIVE
Ketones, ur: 15 mg/dL — AB
Leukocytes, UA: NEGATIVE
Nitrite: NEGATIVE
PH: 7 (ref 5.0–8.0)
Protein, ur: NEGATIVE mg/dL
SPECIFIC GRAVITY, URINE: 1.01 (ref 1.005–1.030)

## 2015-06-03 MED ORDER — CETIRIZINE HCL 10 MG PO TABS: 10.0000 mg | ORAL_TABLET | Freq: Every day | ORAL | Status: DC

## 2015-06-03 MED ORDER — IPRATROPIUM-ALBUTEROL 0.5-2.5 (3) MG/3ML IN SOLN
3.0000 mL | Freq: Once | RESPIRATORY_TRACT | Status: AC
  Administered 2015-06-03: 3 mL via RESPIRATORY_TRACT
  Filled 2015-06-03: qty 3

## 2015-06-03 MED ORDER — PREDNISONE 50 MG PO TABS
60.0000 mg | ORAL_TABLET | Freq: Once | ORAL | Status: AC
  Administered 2015-06-03: 60 mg via ORAL
  Filled 2015-06-03: qty 1

## 2015-06-03 MED ORDER — PREDNISONE 20 MG PO TABS: 40.0000 mg | ORAL_TABLET | Freq: Every day | ORAL | Status: DC

## 2015-06-03 NOTE — MAU Provider Note (Signed)
History     CSN: [id number]  Arrival date and time: 06/03/15 1520   First Provider Initiated Contact with Patient 06/03/15 1555      Chief Complaint  Patient presents with  . Wheezing  . URI   Catherine Osborne is a 40 y.o. [id number] at [redacted]w[redacted]d who presents for wheezing & SOB. PMH significant for asthma. Denies contractions, vaginal bleeding, or LOF. Positive fetal movement. Denies fever, sick contacts, sore throat, chest pain.   Wheezing  This is a new problem. The current episode started yesterday. The problem occurs constantly. The problem has been gradually worsening. Associated symptoms include coughing and shortness of breath. Pertinent negatives include no abdominal pain, chest pain, chills, fever, hemoptysis, sputum production or vomiting. The symptoms are aggravated by any activity. She has tried beta agonist inhalers (Reports increased rescue inhaler use; has used 5 times today with no relief. Also uses maintenance inhaler (doesn't know which one and doesn't have it with her). ) for the symptoms. The treatment provided no relief. Her past medical history is significant for asthma and pneumonia. There is no history of past MI or PE.   OB History    Gravida Para Term Preterm AB TAB SAB Ectopic Multiple Living   14 1  1 12 2 10   0 1      Past Medical History  Diagnosis Date  . Asthma   . Pneumonia   . Allergy   . Vaginal Pap smear, abnormal   . Prior miscarriage with pregnancy in first trimester, antepartum   . Headache     Past Surgical History  Procedure Laterality Date  . Dnc    . Foot surgery    . Dilation and evacuation N/A 05/22/2012    Procedure: DILATATION AND EVACUATION;  Surgeon: Frederico Hamman, MD;  Location: [facility];  Service: Gynecology;  Laterality: N/A;  . Cervical cerclage N/A 02/26/2015    Procedure: CERCLAGE CERVICAL;  Surgeon: Frederico Hamman, MD;  Location: [facility];  Service: Gynecology;  Laterality: N/A;  . Dilation and curettage of uterus       Family History  Problem Relation Age of Onset  . Bone cancer Mother   . Breast cancer Maternal Grandmother   . Diabetes    . Hypertension    . Stroke    . Heart attack      Social History  Substance Use Topics  . Smoking status: Former Research scientist (life sciences)  . Smokeless tobacco: None  . Alcohol Use: 0.6 oz/week    1 Glasses of wine per week     Comment: Jun 01 2015    Allergies: No Known Allergies  Prescriptions prior to admission  Medication Sig Dispense Refill Last Dose  . albuterol (PROVENTIL HFA;VENTOLIN HFA) 108 (90 Base) MCG/ACT inhaler Inhale 2 puffs into the lungs every 4 (four) hours as needed for wheezing or shortness of breath.   06/03/2015 at Unknown time  . cyclobenzaprine (FLEXERIL) 5 MG tablet Take 1 tablet (5 mg total) by mouth 3 (three) times daily as needed for muscle spasms. 30 tablet 0 Past Month at Unknown time  . ondansetron (ZOFRAN ODT) 4 MG disintegrating tablet Take 1 tablet (4 mg total) by mouth every 8 (eight) hours as needed for nausea or vomiting. 20 tablet 0 06/03/2015 at Unknown time  . Prenatal Vit-Fe Fumarate-FA (PRENATAL MULTIVITAMIN) TABS tablet Take 1 tablet by mouth at bedtime.    06/02/2015 at Unknown time  . ValACYclovir HCl (VALTREX PO) Take by mouth.  06/02/2015 at Unknown time  . progesterone (PROMETRIUM) 200 MG capsule Place 1 capsule (200 mg total) vaginally daily. (Patient not taking: Reported on 05/27/2015) 30 capsule 2 Not Taking  . ranitidine (ZANTAC) 150 MG capsule Take 1 capsule (150 mg total) by mouth 2 (two) times daily. (Patient not taking: Reported on 05/27/2015) 30 capsule 0 Not Taking at Unknown time    Review of Systems  Constitutional: Negative.  Negative for fever and chills.  Respiratory: Positive for cough, shortness of breath and wheezing. Negative for hemoptysis and sputum production.   Cardiovascular: Negative.  Negative for chest pain.  Gastrointestinal: Negative.  Negative for vomiting and abdominal pain.  Genitourinary: Negative.     Physical Exam   Blood pressure 136/71, pulse 78, temperature 98.8 F (37.1 C), temperature source Oral, resp. rate 20, height 5\' 2"  (1.575 m), weight 196 lb 9.6 oz (89.177 kg), last menstrual period 09/27/2014, SpO2 96 %, unknown if currently breastfeeding.  Physical Exam  Nursing note and vitals reviewed. Constitutional: She is oriented to person, place, and time. She appears well-developed and well-nourished. No distress.  HENT:  Head: Normocephalic and atraumatic.  Eyes: Conjunctivae are normal. Right eye exhibits no discharge. Left eye exhibits no discharge. No scleral icterus.  Neck: Normal range of motion.  Cardiovascular: Normal rate, regular rhythm and normal heart sounds.   No murmur heard. Respiratory: Effort normal. No accessory muscle usage. No tachypnea and no bradypnea. No respiratory distress. She has wheezes (throughout).  Neurological: She is alert and oriented to person, place, and time.  Skin: Skin is warm and dry. She is not diaphoretic.  Psychiatric: She has a normal mood and affect. Her behavior is normal. Judgment and thought content normal.   Fetal Tracing:  Baseline: 135 Variability: moderate Accelerations: 15x15 Decelerations: none  Toco: irregular   MAU Course  Procedures Results for orders placed or performed during the hospital encounter of 06/03/15 (from the past 24 hour(s))  Urinalysis, Routine w reflex microscopic (not at [facility])     Status: Abnormal   Collection Time: 06/03/15  3:24 PM  Result Value Ref Range   Color, Urine YELLOW YELLOW   APPearance CLEAR CLEAR   Specific Gravity, Urine 1.010 1.005 - 1.030   pH 7.0 5.0 - 8.0   Glucose, UA NEGATIVE NEGATIVE mg/dL   Hgb urine dipstick NEGATIVE NEGATIVE   Bilirubin Urine NEGATIVE NEGATIVE   Ketones, ur 15 (A) NEGATIVE mg/dL   Protein, ur NEGATIVE NEGATIVE mg/dL   Nitrite NEGATIVE NEGATIVE   Leukocytes, UA NEGATIVE NEGATIVE    MDM Reactive tracing O2 sates 96-100% Duoneb nebulizer  treatment x 2 with symptoms improvement after 2nd treatment. Prednisone 60 mg PO Pt has next appt with Dr. Ruthann Cancer tomorrow morning  Assessment and Plan  A: 1. Asthma affecting pregnancy, antepartum     P: Discharge home Rx prednisone 40 mg PO daily x 5 day Rx zyrtec 10 mg daily F/u with Dr. Ruthann Cancer Discussed reasons to return  Jorje Guild 06/03/2015, 3:55 PM

## 2015-06-03 NOTE — Discharge Instructions (Signed)
Asthma, Adult Asthma is a recurring condition in which the airways tighten and narrow. Asthma can make it difficult to breathe. It can cause coughing, wheezing, and shortness of breath. Asthma episodes, also called asthma attacks, range from minor to life-threatening. Asthma cannot be cured, but medicines and lifestyle changes can help control it. CAUSES Asthma is believed to be caused by inherited (genetic) and environmental factors, but its exact cause is unknown. Asthma may be triggered by allergens, lung infections, or irritants in the air. Asthma triggers are different for each person. Common triggers include:   Animal dander.  Dust mites.  Cockroaches.  Pollen from trees or grass.  Mold.  Smoke.  Air pollutants such as dust, household cleaners, hair sprays, aerosol sprays, paint fumes, strong chemicals, or strong odors.  Cold air, weather changes, and winds (which increase molds and pollens in the air).  Strong emotional expressions such as crying or laughing hard.  Stress.  Certain medicines (such as aspirin) or types of drugs (such as beta-blockers).  Sulfites in foods and drinks. Foods and drinks that may contain sulfites include dried fruit, potato chips, and sparkling grape juice.  Infections or inflammatory conditions such as the flu, a cold, or an inflammation of the nasal membranes (rhinitis).  Gastroesophageal reflux disease (GERD).  Exercise or strenuous activity. SYMPTOMS Symptoms may occur immediately after asthma is triggered or many hours later. Symptoms include:  Wheezing.  Excessive nighttime or early morning coughing.  Frequent or severe coughing with a common cold.  Chest tightness.  Shortness of breath. DIAGNOSIS  The diagnosis of asthma is made by a review of your medical history and a physical exam. Tests may also be performed. These may include:  Lung function studies. These tests show how much air you breathe in and out.  Allergy  tests.  Imaging tests such as X-rays. TREATMENT  Asthma cannot be cured, but it can usually be controlled. Treatment involves identifying and avoiding your asthma triggers. It also involves medicines. There are 2 classes of medicine used for asthma treatment:   Controller medicines. These prevent asthma symptoms from occurring. They are usually taken every day.  Reliever or rescue medicines. These quickly relieve asthma symptoms. They are used as needed and provide short-term relief. Your health care provider will help you create an asthma action plan. An asthma action plan is a written plan for managing and treating your asthma attacks. It includes a list of your asthma triggers and how they may be avoided. It also includes information on when medicines should be taken and when their dosage should be changed. An action plan may also involve the use of a device called a peak flow meter. A peak flow meter measures how well the lungs are working. It helps you monitor your condition. HOME CARE INSTRUCTIONS   Take medicines only as directed by your health care provider. Speak with your health care provider if you have questions about how or when to take the medicines.  Use a peak flow meter as directed by your health care provider. Record and keep track of readings.  Understand and use the action plan to help minimize or stop an asthma attack without needing to seek medical care.  Control your home environment in the following ways to help prevent asthma attacks:  Do not smoke. Avoid being exposed to secondhand smoke.  Change your heating and air conditioning filter regularly.  Limit your use of fireplaces and wood stoves.  Get rid of pests (such as roaches   and mice) and their droppings.  Throw away plants if you see mold on them.  Clean your floors and dust regularly. Use unscented cleaning products.  Try to have someone else vacuum for you regularly. Stay out of rooms while they are  being vacuumed and for a short while afterward. If you vacuum, use a dust mask from a hardware store, a double-layered or microfilter vacuum cleaner bag, or a vacuum cleaner with a HEPA filter.  Replace carpet with wood, tile, or vinyl flooring. Carpet can trap dander and dust.  Use allergy-proof pillows, mattress covers, and box spring covers.  Wash bed sheets and blankets every week in hot water and dry them in a dryer.  Use blankets that are made of polyester or cotton.  Clean bathrooms and kitchens with bleach. If possible, have someone repaint the walls in these rooms with mold-resistant paint. Keep out of the rooms that are being cleaned and painted.  Wash hands frequently. SEEK MEDICAL CARE IF:   You have wheezing, shortness of breath, or a cough even if taking medicine to prevent attacks.  The colored mucus you cough up (sputum) is thicker than usual.  Your sputum changes from clear or white to yellow, green, gray, or bloody.  You have any problems that may be related to the medicines you are taking (such as a rash, itching, swelling, or trouble breathing).  You are using a reliever medicine more than 2-3 times per week.  Your peak flow is still at 50-79% of your personal best after following your action plan for 1 hour.  You have a fever. SEEK IMMEDIATE MEDICAL CARE IF:   You seem to be getting worse and are unresponsive to treatment during an asthma attack.  You are short of breath even at rest.  You get short of breath when doing very little physical activity.  You have difficulty eating, drinking, or talking due to asthma symptoms.  You develop chest pain.  You develop a fast heartbeat.  You have a bluish color to your lips or fingernails.  You are light-headed, dizzy, or faint.  Your peak flow is less than 50% of your personal best.   This information is not intended to replace advice given to you by your health care provider. Make sure you discuss any  questions you have with your health care provider.   Document Released: 01/17/2005 Document Revised: 10/08/2014 Document Reviewed: 08/16/2012 Elsevier Interactive Patient Education 2016 [employer] Inc.  

## 2015-06-03 NOTE — MAU Note (Signed)
Pt has Asthma and has required to use her inhaler more frequently.  Today she has used the inhaler x 5 times since this morning.  No vaginal bleeding, ROM, or abnormal discharge.  Good fetal movement.

## 2015-06-10 ENCOUNTER — Encounter (HOSPITAL_COMMUNITY): Payer: Self-pay | Admitting: Certified Nurse Midwife

## 2015-06-10 ENCOUNTER — Inpatient Hospital Stay (HOSPITAL_COMMUNITY)
Admission: AD | Admit: 2015-06-10 | Discharge: 2015-06-14 | DRG: 775 | Disposition: A | Discharge status: Home / Self Care | Payer: Medicaid Other | Source: Ambulatory Visit | Attending: Obstetrics | Admitting: Obstetrics

## 2015-06-10 ENCOUNTER — Ambulatory Visit (HOSPITAL_COMMUNITY)
Admission: RE | Admit: 2015-06-10 | Discharge: 2015-06-10 | Disposition: A | Discharge status: Home / Self Care | Payer: Medicaid Other | Source: Ambulatory Visit | Attending: Obstetrics | Admitting: Obstetrics

## 2015-06-10 ENCOUNTER — Encounter (HOSPITAL_COMMUNITY): Payer: Self-pay

## 2015-06-10 DIAGNOSIS — O9952 Diseases of the respiratory system complicating childbirth: Secondary | ICD-10-CM | POA: Diagnosis present

## 2015-06-10 DIAGNOSIS — O09523 Supervision of elderly multigravida, third trimester: Secondary | ICD-10-CM

## 2015-06-10 DIAGNOSIS — Z3A37 37 weeks gestation of pregnancy: Secondary | ICD-10-CM | POA: Diagnosis not present

## 2015-06-10 DIAGNOSIS — O403XX0 Polyhydramnios, third trimester, not applicable or unspecified: Secondary | ICD-10-CM

## 2015-06-10 DIAGNOSIS — Z8249 Family history of ischemic heart disease and other diseases of the circulatory system: Secondary | ICD-10-CM | POA: Diagnosis not present

## 2015-06-10 DIAGNOSIS — Z823 Family history of stroke: Secondary | ICD-10-CM | POA: Diagnosis not present

## 2015-06-10 DIAGNOSIS — Z808 Family history of malignant neoplasm of other organs or systems: Secondary | ICD-10-CM | POA: Diagnosis not present

## 2015-06-10 DIAGNOSIS — O139 Gestational [pregnancy-induced] hypertension without significant proteinuria, unspecified trimester: Secondary | ICD-10-CM | POA: Diagnosis present

## 2015-06-10 DIAGNOSIS — Z87891 Personal history of nicotine dependence: Secondary | ICD-10-CM

## 2015-06-10 DIAGNOSIS — Z803 Family history of malignant neoplasm of breast: Secondary | ICD-10-CM | POA: Diagnosis not present

## 2015-06-10 DIAGNOSIS — O1494 Unspecified pre-eclampsia, complicating childbirth: Principal | ICD-10-CM | POA: Diagnosis present

## 2015-06-10 DIAGNOSIS — O1493 Unspecified pre-eclampsia, third trimester: Secondary | ICD-10-CM

## 2015-06-10 DIAGNOSIS — J45909 Unspecified asthma, uncomplicated: Secondary | ICD-10-CM | POA: Diagnosis present

## 2015-06-10 DIAGNOSIS — R030 Elevated blood-pressure reading, without diagnosis of hypertension: Secondary | ICD-10-CM | POA: Diagnosis present

## 2015-06-10 DIAGNOSIS — Z833 Family history of diabetes mellitus: Secondary | ICD-10-CM | POA: Diagnosis not present

## 2015-06-10 DIAGNOSIS — O1490 Unspecified pre-eclampsia, unspecified trimester: Secondary | ICD-10-CM | POA: Diagnosis present

## 2015-06-10 LAB — CBC WITH DIFFERENTIAL/PLATELET
BASOS PCT: 0 %
Basophils Absolute: 0.1 10*3/uL (ref 0.0–0.1)
EOS ABS: 0.2 10*3/uL (ref 0.0–0.7)
EOS PCT: 1 %
HCT: 36 % (ref 36.0–46.0)
Hemoglobin: 12.2 g/dL (ref 12.0–15.0)
LYMPHS ABS: 3 10*3/uL (ref 0.7–4.0)
Lymphocytes Relative: 25 %
MCH: 28.6 pg (ref 26.0–34.0)
MCHC: 33.9 g/dL (ref 30.0–36.0)
MCV: 84.3 fL (ref 78.0–100.0)
MONOS PCT: 10 %
Monocytes Absolute: 1.3 10*3/uL — ABNORMAL HIGH (ref 0.1–1.0)
NEUTROS PCT: 64 %
Neutro Abs: 7.8 10*3/uL — ABNORMAL HIGH (ref 1.7–7.7)
PLATELETS: 283 10*3/uL (ref 150–400)
RBC: 4.27 MIL/uL (ref 3.87–5.11)
RDW: 14.6 % (ref 11.5–15.5)
WBC: 12.3 10*3/uL — ABNORMAL HIGH (ref 4.0–10.5)

## 2015-06-10 LAB — PROTEIN / CREATININE RATIO, URINE
Creatinine, Urine: 86 mg/dL
PROTEIN CREATININE RATIO: 0.34 mg/mg{creat} — AB (ref 0.00–0.15)
Total Protein, Urine: 29 mg/dL

## 2015-06-10 LAB — URINALYSIS, ROUTINE W REFLEX MICROSCOPIC
Bilirubin Urine: NEGATIVE
GLUCOSE, UA: NEGATIVE mg/dL
HGB URINE DIPSTICK: NEGATIVE
Ketones, ur: NEGATIVE mg/dL
Leukocytes, UA: NEGATIVE
Nitrite: NEGATIVE
PH: 6.5 (ref 5.0–8.0)
Protein, ur: NEGATIVE mg/dL
SPECIFIC GRAVITY, URINE: 1.02 (ref 1.005–1.030)

## 2015-06-10 LAB — COMPREHENSIVE METABOLIC PANEL
ALBUMIN: 2.6 g/dL — AB (ref 3.5–5.0)
ALK PHOS: 128 U/L — AB (ref 38–126)
ALT: 18 U/L (ref 14–54)
ANION GAP: 8 (ref 5–15)
AST: 21 U/L (ref 15–41)
BUN: 8 mg/dL (ref 6–20)
CALCIUM: 8.7 mg/dL — AB (ref 8.9–10.3)
CHLORIDE: 106 mmol/L (ref 101–111)
CO2: 24 mmol/L (ref 22–32)
Creatinine, Ser: 0.59 mg/dL (ref 0.44–1.00)
GFR calc non Af Amer: >60 mL/min (ref >60)
GLUCOSE: 82 mg/dL (ref 65–99)
POTASSIUM: 3.5 mmol/L (ref 3.5–5.1)
SODIUM: 138 mmol/L (ref 135–145)
Total Bilirubin: 0.3 mg/dL (ref 0.3–1.2)
Total Protein: 6.8 g/dL (ref 6.5–8.1)

## 2015-06-10 LAB — URIC ACID: URIC ACID, SERUM: 5.1 mg/dL (ref 2.3–6.6)

## 2015-06-10 LAB — TYPE AND SCREEN
ABO/RH(D): B POS
ANTIBODY SCREEN: NEGATIVE

## 2015-06-10 LAB — LACTATE DEHYDROGENASE: LDH: 122 U/L (ref 98–192)

## 2015-06-10 LAB — GROUP B STREP BY PCR: GROUP B STREP BY PCR: POSITIVE — AB

## 2015-06-10 LAB — OB RESULTS CONSOLE GBS: STREP GROUP B AG: POSITIVE

## 2015-06-10 MED ORDER — MISOPROSTOL 25 MCG QUARTER TABLET: 25.0000 ug | ORAL_TABLET | ORAL | Status: DC | PRN

## 2015-06-10 MED ORDER — PROMETHAZINE HCL 25 MG/ML IJ SOLN
25.0000 mg | Freq: Four times a day (QID) | INTRAMUSCULAR | Status: DC | PRN
  Administered 2015-06-11 – 2015-06-12 (×2): 25 mg via INTRAMUSCULAR
  Filled 2015-06-10 (×2): qty 1

## 2015-06-10 MED ORDER — FLEET ENEMA 7-19 GM/118ML RE ENEM: 1.0000 | ENEMA | RECTAL | Status: DC | PRN

## 2015-06-10 MED ORDER — OXYTOCIN BOLUS FROM INFUSION: 500.0000 mL | INTRAVENOUS | Status: DC

## 2015-06-10 MED ORDER — OXYTOCIN 10 UNIT/ML IJ SOLN: 2.5000 [IU]/h | INTRAVENOUS | Status: DC

## 2015-06-10 MED ORDER — LACTATED RINGERS IV SOLN
500.0000 mL | INTRAVENOUS | Status: DC | PRN
  Administered 2015-06-10: 250 mL via INTRAVENOUS

## 2015-06-10 MED ORDER — LABETALOL HCL 300 MG PO TABS
300.0000 mg | ORAL_TABLET | Freq: Two times a day (BID) | ORAL | Status: DC
  Administered 2015-06-10 – 2015-06-12 (×3): 300 mg via ORAL
  Filled 2015-06-10 (×6): qty 1

## 2015-06-10 MED ORDER — PENICILLIN G POTASSIUM 5000000 UNITS IJ SOLR
5.0000 10*6.[IU] | Freq: Once | INTRAVENOUS | Status: AC
  Administered 2015-06-10: 5 10*6.[IU] via INTRAVENOUS
  Filled 2015-06-10: qty 5

## 2015-06-10 MED ORDER — OXYTOCIN 10 UNIT/ML IJ SOLN
1.0000 m[IU]/min | INTRAVENOUS | Status: DC
  Administered 2015-06-11: 8 m[IU]/min via INTRAVENOUS
  Administered 2015-06-11: 1 m[IU]/min via INTRAVENOUS
  Filled 2015-06-10: qty 4

## 2015-06-10 MED ORDER — NALBUPHINE HCL 10 MG/ML IJ SOLN
10.0000 mg | INTRAMUSCULAR | Status: DC | PRN
  Administered 2015-06-11 – 2015-06-12 (×4): 10 mg via INTRAVENOUS
  Filled 2015-06-10 (×4): qty 1

## 2015-06-10 MED ORDER — OXYCODONE-ACETAMINOPHEN 5-325 MG PO TABS: 1.0000 | ORAL_TABLET | ORAL | Status: DC | PRN

## 2015-06-10 MED ORDER — ONDANSETRON HCL 4 MG/2ML IJ SOLN
4.0000 mg | Freq: Four times a day (QID) | INTRAMUSCULAR | Status: DC | PRN
  Administered 2015-06-11: 4 mg via INTRAVENOUS
  Filled 2015-06-10: qty 2

## 2015-06-10 MED ORDER — NALBUPHINE HCL 10 MG/ML IJ SOLN
10.0000 mg | Freq: Four times a day (QID) | INTRAMUSCULAR | Status: DC | PRN
  Administered 2015-06-12: 10 mg via INTRAMUSCULAR
  Filled 2015-06-10: qty 1

## 2015-06-10 MED ORDER — CITRIC ACID-SODIUM CITRATE 334-500 MG/5ML PO SOLN: 30.0000 mL | ORAL | Status: DC | PRN

## 2015-06-10 MED ORDER — OXYCODONE-ACETAMINOPHEN 5-325 MG PO TABS: 2.0000 | ORAL_TABLET | ORAL | Status: DC | PRN

## 2015-06-10 MED ORDER — TERBUTALINE SULFATE 1 MG/ML IJ SOLN: 0.2500 mg | Freq: Once | INTRAMUSCULAR | Status: DC | PRN

## 2015-06-10 MED ORDER — PENICILLIN G POTASSIUM 5000000 UNITS IJ SOLR
2.5000 10*6.[IU] | INTRAVENOUS | Status: DC
  Administered 2015-06-11 – 2015-06-12 (×8): 2.5 10*6.[IU] via INTRAVENOUS
  Filled 2015-06-10 (×14): qty 2.5

## 2015-06-10 MED ORDER — ZOLPIDEM TARTRATE 5 MG PO TABS
5.0000 mg | ORAL_TABLET | Freq: Every evening | ORAL | Status: DC | PRN
  Administered 2015-06-11: 5 mg via ORAL
  Filled 2015-06-10: qty 1

## 2015-06-10 MED ORDER — ACETAMINOPHEN 325 MG PO TABS: 650.0000 mg | ORAL_TABLET | ORAL | Status: DC | PRN

## 2015-06-10 MED ORDER — LIDOCAINE HCL (PF) 1 % IJ SOLN: 30.0000 mL | INTRAMUSCULAR | Status: DC | PRN

## 2015-06-10 MED ORDER — LACTATED RINGERS IV SOLN
INTRAVENOUS | Status: DC
  Administered 2015-06-10 – 2015-06-13 (×8): via INTRAVENOUS

## 2015-06-10 MED ORDER — MISOPROSTOL 25 MCG QUARTER TABLET
25.0000 ug | ORAL_TABLET | ORAL | Status: DC
  Administered 2015-06-10: 25 ug via VAGINAL
  Filled 2015-06-10: qty 0.25
  Filled 2015-06-10 (×6): qty 1

## 2015-06-10 NOTE — H&P (Signed)
History     CSN: [id number]  Arrival date and time: 06/10/15 [id number]   First Provider Initiated Contact with Patient 06/10/15 [phone]      Chief Complaint  Patient presents with  . Hypertension   HPI Ms. Catherine Osborne is a 40 y.o. [id number] at [redacted]w[redacted]d who presents to MAU today from MFM for further evaluation of elevated blood pressure. She denies history of HTN. She has had 1 PTD and many SABs. She had cerclage in place this pregnancy that was removed on 05/26/15. She denies headache, floaters, RUQ abdominal pain, vaginal bleeding, LOF or contractions. She does endorse slightly blurred vision x 1 month and bilateral LE for weeks. She states this does not improve with rest. She reports good fetal movement.   OB History    Gravida Para Term Preterm AB TAB SAB Ectopic Multiple Living   14 1  1 12 2 10   0 1      Past Medical History  Diagnosis Date  . Asthma   . Pneumonia   . Allergy   . Vaginal Pap smear, abnormal   . Prior miscarriage with pregnancy in first trimester, antepartum   . Headache     Past Surgical History  Procedure Laterality Date  . Dnc    . Foot surgery    . Dilation and evacuation N/A 05/22/2012    Procedure: DILATATION AND EVACUATION;  Surgeon: Frederico Hamman, MD;  Location: [facility];  Service: Gynecology;  Laterality: N/A;  . Cervical cerclage N/A 02/26/2015    Procedure: CERCLAGE CERVICAL;  Surgeon: Frederico Hamman, MD;  Location: [facility];  Service: Gynecology;  Laterality: N/A;  . Dilation and curettage of uterus      Family History  Problem Relation Age of Onset  . Bone cancer Mother   . Breast cancer Maternal Grandmother   . Diabetes    . Hypertension    . Stroke    . Heart attack      Social History  Substance Use Topics  . Smoking status: Former Research scientist (life sciences)  . Smokeless tobacco: None  . Alcohol Use: 0.6 oz/week    1 Glasses of wine per week     Comment: Jun 01 2015    Allergies: No Known Allergies  Prescriptions prior to admission   Medication Sig Dispense Refill Last Dose  . albuterol (PROVENTIL HFA;VENTOLIN HFA) 108 (90 Base) MCG/ACT inhaler Inhale 2 puffs into the lungs every 4 (four) hours as needed for wheezing or shortness of breath. Reported on 06/10/2015   Past Week at Unknown time  . beclomethasone (QVAR) 40 MCG/ACT inhaler Inhale 1 puff into the lungs 2 (two) times daily. Reported on 06/10/2015   Past Week at Unknown time  . cetirizine (ZYRTEC) 10 MG tablet Take 1 tablet (10 mg total) by mouth daily. 30 tablet 0 Past Week at Unknown time  . predniSONE (DELTASONE) 20 MG tablet Take 2 tablets (40 mg total) by mouth daily with breakfast. 10 tablet 0 06/09/2015 at Unknown time  . Prenatal Vit-Fe Fumarate-FA (PRENATAL MULTIVITAMIN) TABS tablet Take 1 tablet by mouth at bedtime.    06/09/2015 at Unknown time  . valACYclovir (VALTREX) 500 MG tablet Take 500 mg by mouth at bedtime.   0 06/09/2015 at Unknown time  . cyclobenzaprine (FLEXERIL) 5 MG tablet Take 1 tablet (5 mg total) by mouth 3 (three) times daily as needed for muscle spasms. (Patient not taking: Reported on 06/10/2015) 30 tablet 0 prn at Unknown time  . ondansetron [facility]  ODT) 4 MG disintegrating tablet Take 1 tablet (4 mg total) by mouth every 8 (eight) hours as needed for nausea or vomiting. (Patient not taking: Reported on 06/10/2015) 20 tablet 0 prn at Unknown time    Review of Systems  Constitutional: Negative for fever and malaise/fatigue.  Eyes: Positive for blurred vision.       Neg - floaters  Cardiovascular: Positive for leg swelling.  Gastrointestinal: Negative for abdominal pain.  Genitourinary:       Neg - vaginal bleeding, discharge, LOF  Neurological: Negative for headaches.   Physical Exam   Blood pressure 147/90, pulse 78, temperature 98.4 F (36.9 C), temperature source Oral, resp. rate 18, height 5\' 2"  (1.575 m), weight 199 lb 6.4 oz (90.447 kg), last menstrual period 09/27/2014, unknown if currently breastfeeding.  Physical Exam  Nursing  note and vitals reviewed. Constitutional: She is oriented to person, place, and time. She appears well-developed and well-nourished. No distress.  HENT:  Head: Normocephalic and atraumatic.  Cardiovascular: Normal rate.   Respiratory: Effort normal.  GI: Soft. She exhibits no distension and no mass. There is no tenderness. There is no rebound and no guarding.  Musculoskeletal: She exhibits edema (2+ pitting edema to the knee bilaterally).  Neurological: She is alert and oriented to person, place, and time.  Reflex Scores:      Bicep reflexes are 3+ on the right side and 3+ on the left side.      Brachioradialis reflexes are 3+ on the right side and 3+ on the left side.      Patellar reflexes are 2+ on the right side and 2+ on the left side. No Clonus  Skin: Skin is warm and dry. No erythema.  Psychiatric: She has a normal mood and affect.    Results for orders placed or performed during the hospital encounter of 06/10/15 (from the past 24 hour(s))  Protein / creatinine ratio, urine     Status: Abnormal   Collection Time: 06/10/15  8:36 AM  Result Value Ref Range   Creatinine, Urine 86.00 mg/dL   Total Protein, Urine 29 mg/dL   Protein Creatinine Ratio 0.34 (H) 0.00 - 0.15 mg/mg[Cre]  Urinalysis, Routine w reflex microscopic (not at [facility])     Status: None   Collection Time: 06/10/15  8:36 AM  Result Value Ref Range   Color, Urine YELLOW YELLOW   APPearance CLEAR CLEAR   Specific Gravity, Urine 1.020 1.005 - 1.030   pH 6.5 5.0 - 8.0   Glucose, UA NEGATIVE NEGATIVE mg/dL   Hgb urine dipstick NEGATIVE NEGATIVE   Bilirubin Urine NEGATIVE NEGATIVE   Ketones, ur NEGATIVE NEGATIVE mg/dL   Protein, ur NEGATIVE NEGATIVE mg/dL   Nitrite NEGATIVE NEGATIVE   Leukocytes, UA NEGATIVE NEGATIVE  CBC with Differential/Platelet     Status: Abnormal   Collection Time: 06/10/15  8:58 AM  Result Value Ref Range   WBC 12.3 (H) 4.0 - 10.5 K/uL   RBC 4.27 3.87 - 5.11 MIL/uL   Hemoglobin 12.2 12.0  - 15.0 g/dL   HCT 36.0 36.0 - 46.0 %   MCV 84.3 78.0 - 100.0 fL   MCH 28.6 26.0 - 34.0 pg   MCHC 33.9 30.0 - 36.0 g/dL   RDW 14.6 11.5 - 15.5 %   Platelets 283 150 - 400 K/uL   Neutrophils Relative % 64 %   Neutro Abs 7.8 (H) 1.7 - 7.7 K/uL   Lymphocytes Relative 25 %   Lymphs Abs 3.0 0.7 -  4.0 K/uL   Monocytes Relative 10 %   Monocytes Absolute 1.3 (H) 0.1 - 1.0 K/uL   Eosinophils Relative 1 %   Eosinophils Absolute 0.2 0.0 - 0.7 K/uL   Basophils Relative 0 %   Basophils Absolute 0.1 0.0 - 0.1 K/uL  Comprehensive metabolic panel     Status: Abnormal   Collection Time: 06/10/15  8:58 AM  Result Value Ref Range   Sodium 138 135 - 145 mmol/L   Potassium 3.5 3.5 - 5.1 mmol/L   Chloride 106 101 - 111 mmol/L   CO2 24 22 - 32 mmol/L   Glucose, Bld 82 65 - 99 mg/dL   BUN 8 6 - 20 mg/dL   Creatinine, Ser 0.59 0.44 - 1.00 mg/dL   Calcium 8.7 (L) 8.9 - 10.3 mg/dL   Total Protein 6.8 6.5 - 8.1 g/dL   Albumin 2.6 (L) 3.5 - 5.0 g/dL   AST 21 15 - 41 U/L   ALT 18 14 - 54 U/L   Alkaline Phosphatase 128 (H) 38 - 126 U/L   Total Bilirubin 0.3 0.3 - 1.2 mg/dL   GFR calc non Af Amer >60 >60 mL/min   GFR calc Af Amer >60 >60 mL/min   Anion gap 8 5 - 15  Uric acid     Status: None   Collection Time: 06/10/15  8:58 AM  Result Value Ref Range   Uric Acid, Serum 5.1 2.3 - 6.6 mg/dL  Lactate dehydrogenase     Status: None   Collection Time: 06/10/15  8:58 AM  Result Value Ref Range   LDH 122 98 - 192 U/L   Patient Vitals for the past 24 hrs:  BP Temp Temp src Pulse Resp Height Weight  06/10/15 1114 127/83 mmHg - - 83 - - -  06/10/15 1050 134/88 mmHg - - 85 - - -  06/10/15 1040 135/88 mmHg - - 84 - - -  06/10/15 1030 130/91 mmHg - - 83 - - -  06/10/15 1020 131/92 mmHg - - 79 - - -  06/10/15 1010 122/92 mmHg - - 89 - - -  06/10/15 1000 135/89 mmHg - - 79 - - -  06/10/15 0951 131/96 mmHg - - 107 - - -  06/10/15 0940 131/92 mmHg - - 82 - - -  06/10/15 0930 144/89 mmHg - - 70 - - -   06/10/15 0920 134/98 mmHg - - 84 - - -  06/10/15 0912 147/90 mmHg - - 78 - - -  06/10/15 0904 138/87 mmHg - - 76 - - -  06/10/15 0858 136/88 mmHg 98.4 F (36.9 C) Oral 78 18 5\' 2"  (1.575 m) 199 lb 6.4 oz (90.447 kg)    Fetal Monitoring: Baseline: 140 bpm Variability: moderate Accelerations: 15 x 15 Decelerations: none Contractions: none  MAU Course  Procedures   MDM UA, CBC, CMP, Uric Acid, LDH, Urine Protein/Creatinine ratio ordered Serial BPs 0940 - Labs pending. Care turned over to Jorje Guild, NP.  BPs remain elevated; no severe ranges BPs PCR elevated Reactive tracine BPP 8/8 & AFI 23 per outpatient ultrasound this morning  Will induce for preeclampsia per recommendation of Dr.Nitsche, MFM. GBS pcr collected Assessment and Plan  A: 1. Preeclampsia, third trimester    P Admit to birthing suites for IOL GBS pcr pending

## 2015-06-10 NOTE — MAU Note (Signed)
Pt presents from the office for preeclampsia work up

## 2015-06-10 NOTE — Anesthesia Pain Management Evaluation Note (Signed)
  CRNA Pain Management Visit Note  Patient: Catherine Osborne, 40 y.o., female  "Hello I am a member of the anesthesia team at [facility]. We have an anesthesia team available at all times to provide care throughout the hospital, including epidural management and anesthesia for C-section. I don't know your plan for the delivery whether it a natural birth, water birth, IV sedation, nitrous supplementation, doula or epidural, but we want to meet your pain goals."   1.Was your pain managed to your expectations on prior hospitalizations?   Yes   2.What is your expectation for pain management during this hospitalization?     Epidural, IV pain meds and Nitrous Oxide  3.How can we help you reach that goal? Let me try natural first.  Record the patient's initial score and the patient's pain goal.   Pain: 0  Pain Goal: 5 [facility] wants you to be able to say your pain was always managed very well.  Shawne Eskelson 06/10/2015

## 2015-06-10 NOTE — MAU Provider Note (Signed)
History     CSN: [id number]  Arrival date and time: 06/10/15 [id number]   First Provider Initiated Contact with Patient 06/10/15 [phone]      Chief Complaint  Patient presents with  . Hypertension   HPI Catherine Osborne is a 40 y.o. [id number] at [redacted]w[redacted]d who presents to MAU today from MFM for further evaluation of elevated blood pressure. She denies history of HTN. She has had 1 PTD and many SABs. She had cerclage in place this pregnancy that was removed on 05/26/15. She denies headache, floaters, RUQ abdominal pain, vaginal bleeding, LOF or contractions. She does endorse slightly blurred vision x 1 month and bilateral LE for weeks. She states this does not improve with rest. She reports good fetal movement.   OB History    Gravida Para Term Preterm AB TAB SAB Ectopic Multiple Living   14 1  1 12 2 10   0 1      Past Medical History  Diagnosis Date  . Asthma   . Pneumonia   . Allergy   . Vaginal Pap smear, abnormal   . Prior miscarriage with pregnancy in first trimester, antepartum   . Headache     Past Surgical History  Procedure Laterality Date  . Dnc    . Foot surgery    . Dilation and evacuation N/A 05/22/2012    Procedure: DILATATION AND EVACUATION;  Surgeon: Frederico Hamman, MD;  Location: [facility];  Service: Gynecology;  Laterality: N/A;  . Cervical cerclage N/A 02/26/2015    Procedure: CERCLAGE CERVICAL;  Surgeon: Frederico Hamman, MD;  Location: [facility];  Service: Gynecology;  Laterality: N/A;  . Dilation and curettage of uterus      Family History  Problem Relation Age of Onset  . Bone cancer Mother   . Breast cancer Maternal Grandmother   . Diabetes    . Hypertension    . Stroke    . Heart attack      Social History  Substance Use Topics  . Smoking status: Former Research scientist (life sciences)  . Smokeless tobacco: None  . Alcohol Use: 0.6 oz/week    1 Glasses of wine per week     Comment: Jun 01 2015    Allergies: No Known Allergies  Prescriptions prior to admission   Medication Sig Dispense Refill Last Dose  . albuterol (PROVENTIL HFA;VENTOLIN HFA) 108 (90 Base) MCG/ACT inhaler Inhale 2 puffs into the lungs every 4 (four) hours as needed for wheezing or shortness of breath. Reported on 06/10/2015   Past Week at Unknown time  . beclomethasone (QVAR) 40 MCG/ACT inhaler Inhale 1 puff into the lungs 2 (two) times daily. Reported on 06/10/2015   Past Week at Unknown time  . cetirizine (ZYRTEC) 10 MG tablet Take 1 tablet (10 mg total) by mouth daily. 30 tablet 0 Past Week at Unknown time  . predniSONE (DELTASONE) 20 MG tablet Take 2 tablets (40 mg total) by mouth daily with breakfast. 10 tablet 0 06/09/2015 at Unknown time  . Prenatal Vit-Fe Fumarate-FA (PRENATAL MULTIVITAMIN) TABS tablet Take 1 tablet by mouth at bedtime.    06/09/2015 at Unknown time  . valACYclovir (VALTREX) 500 MG tablet Take 500 mg by mouth at bedtime.   0 06/09/2015 at Unknown time  . cyclobenzaprine (FLEXERIL) 5 MG tablet Take 1 tablet (5 mg total) by mouth 3 (three) times daily as needed for muscle spasms. (Patient not taking: Reported on 06/10/2015) 30 tablet 0 prn at Unknown time  . ondansetron [facility]  ODT) 4 MG disintegrating tablet Take 1 tablet (4 mg total) by mouth every 8 (eight) hours as needed for nausea or vomiting. (Patient not taking: Reported on 06/10/2015) 20 tablet 0 prn at Unknown time    Review of Systems  Constitutional: Negative for fever and malaise/fatigue.  Eyes: Positive for blurred vision.       Neg - floaters  Cardiovascular: Positive for leg swelling.  Gastrointestinal: Negative for abdominal pain.  Genitourinary:       Neg - vaginal bleeding, discharge, LOF  Neurological: Negative for headaches.   Physical Exam   Blood pressure 147/90, pulse 78, temperature 98.4 F (36.9 C), temperature source Oral, resp. rate 18, height 5\' 2"  (1.575 m), weight 199 lb 6.4 oz (90.447 kg), last menstrual period 09/27/2014, unknown if currently breastfeeding.  Physical Exam  Nursing  note and vitals reviewed. Constitutional: She is oriented to person, place, and time. She appears well-developed and well-nourished. No distress.  HENT:  Head: Normocephalic and atraumatic.  Cardiovascular: Normal rate.   Respiratory: Effort normal.  GI: Soft. She exhibits no distension and no mass. There is no tenderness. There is no rebound and no guarding.  Musculoskeletal: She exhibits edema (2+ pitting edema to the knee bilaterally).  Neurological: She is alert and oriented to person, place, and time.  Reflex Scores:      Bicep reflexes are 3+ on the right side and 3+ on the left side.      Brachioradialis reflexes are 3+ on the right side and 3+ on the left side.      Patellar reflexes are 2+ on the right side and 2+ on the left side. No Clonus  Skin: Skin is warm and dry. No erythema.  Psychiatric: She has a normal mood and affect.    Results for orders placed or performed during the hospital encounter of 06/10/15 (from the past 24 hour(s))  Protein / creatinine ratio, urine     Status: Abnormal   Collection Time: 06/10/15  8:36 AM  Result Value Ref Range   Creatinine, Urine 86.00 mg/dL   Total Protein, Urine 29 mg/dL   Protein Creatinine Ratio 0.34 (H) 0.00 - 0.15 mg/mg[Cre]  Urinalysis, Routine w reflex microscopic (not at [facility])     Status: None   Collection Time: 06/10/15  8:36 AM  Result Value Ref Range   Color, Urine YELLOW YELLOW   APPearance CLEAR CLEAR   Specific Gravity, Urine 1.020 1.005 - 1.030   pH 6.5 5.0 - 8.0   Glucose, UA NEGATIVE NEGATIVE mg/dL   Hgb urine dipstick NEGATIVE NEGATIVE   Bilirubin Urine NEGATIVE NEGATIVE   Ketones, ur NEGATIVE NEGATIVE mg/dL   Protein, ur NEGATIVE NEGATIVE mg/dL   Nitrite NEGATIVE NEGATIVE   Leukocytes, UA NEGATIVE NEGATIVE  CBC with Differential/Platelet     Status: Abnormal   Collection Time: 06/10/15  8:58 AM  Result Value Ref Range   WBC 12.3 (H) 4.0 - 10.5 K/uL   RBC 4.27 3.87 - 5.11 MIL/uL   Hemoglobin 12.2 12.0  - 15.0 g/dL   HCT 36.0 36.0 - 46.0 %   MCV 84.3 78.0 - 100.0 fL   MCH 28.6 26.0 - 34.0 pg   MCHC 33.9 30.0 - 36.0 g/dL   RDW 14.6 11.5 - 15.5 %   Platelets 283 150 - 400 K/uL   Neutrophils Relative % 64 %   Neutro Abs 7.8 (H) 1.7 - 7.7 K/uL   Lymphocytes Relative 25 %   Lymphs Abs 3.0 0.7 -  4.0 K/uL   Monocytes Relative 10 %   Monocytes Absolute 1.3 (H) 0.1 - 1.0 K/uL   Eosinophils Relative 1 %   Eosinophils Absolute 0.2 0.0 - 0.7 K/uL   Basophils Relative 0 %   Basophils Absolute 0.1 0.0 - 0.1 K/uL  Comprehensive metabolic panel     Status: Abnormal   Collection Time: 06/10/15  8:58 AM  Result Value Ref Range   Sodium 138 135 - 145 mmol/L   Potassium 3.5 3.5 - 5.1 mmol/L   Chloride 106 101 - 111 mmol/L   CO2 24 22 - 32 mmol/L   Glucose, Bld 82 65 - 99 mg/dL   BUN 8 6 - 20 mg/dL   Creatinine, Ser 0.59 0.44 - 1.00 mg/dL   Calcium 8.7 (L) 8.9 - 10.3 mg/dL   Total Protein 6.8 6.5 - 8.1 g/dL   Albumin 2.6 (L) 3.5 - 5.0 g/dL   AST 21 15 - 41 U/L   ALT 18 14 - 54 U/L   Alkaline Phosphatase 128 (H) 38 - 126 U/L   Total Bilirubin 0.3 0.3 - 1.2 mg/dL   GFR calc non Af Amer >60 >60 mL/min   GFR calc Af Amer >60 >60 mL/min   Anion gap 8 5 - 15  Uric acid     Status: None   Collection Time: 06/10/15  8:58 AM  Result Value Ref Range   Uric Acid, Serum 5.1 2.3 - 6.6 mg/dL  Lactate dehydrogenase     Status: None   Collection Time: 06/10/15  8:58 AM  Result Value Ref Range   LDH 122 98 - 192 U/L   Patient Vitals for the past 24 hrs:  BP Temp Temp src Pulse Resp Height Weight  06/10/15 1114 127/83 mmHg - - 83 - - -  06/10/15 1050 134/88 mmHg - - 85 - - -  06/10/15 1040 135/88 mmHg - - 84 - - -  06/10/15 1030 130/91 mmHg - - 83 - - -  06/10/15 1020 131/92 mmHg - - 79 - - -  06/10/15 1010 122/92 mmHg - - 89 - - -  06/10/15 1000 135/89 mmHg - - 79 - - -  06/10/15 0951 131/96 mmHg - - 107 - - -  06/10/15 0940 131/92 mmHg - - 82 - - -  06/10/15 0930 144/89 mmHg - - 70 - - -   06/10/15 0920 134/98 mmHg - - 84 - - -  06/10/15 0912 147/90 mmHg - - 78 - - -  06/10/15 0904 138/87 mmHg - - 76 - - -  06/10/15 0858 136/88 mmHg 98.4 F (36.9 C) Oral 78 18 5\' 2"  (1.575 m) 199 lb 6.4 oz (90.447 kg)    Fetal Monitoring: Baseline: 140 bpm Variability: moderate Accelerations: 15 x 15 Decelerations: none Contractions: none  MAU Course  Procedures   MDM UA, CBC, CMP, Uric Acid, LDH, Urine Protein/Creatinine ratio ordered Serial BPs 0940 - Labs pending. Care turned over to Jorje Guild, NP.   Luvenia Redden, PA-C  06/10/2015, 9:40 AM   BPs remain elevated; no severe ranges BPs PCR elevated Reactive tracine BPP 8/8 & AFI 23 per outpatient ultrasound this morning S/w Dr. Jodi Mourning regarding patient, labs, & VS. Will induce for preeclampsia GBS pcr collected Assessment and Plan  A: 1. Preeclampsia, third trimester    P Admit to birthing suites for IOL GBS pcr pending

## 2015-06-10 NOTE — ED Notes (Signed)
Pt to MAU for BP evaluation.  Report given to Reina Fuse, RN

## 2015-06-10 NOTE — MAU Note (Signed)
Pt transferred to rm 175 via wheelchair. Report given to RN.

## 2015-06-11 LAB — RPR: RPR: NONREACTIVE

## 2015-06-11 MED ORDER — OXYTOCIN 40 UNITS IN LACTATED RINGERS INFUSION - SIMPLE MED: 2.5000 [IU]/h | INTRAVENOUS | Status: DC

## 2015-06-11 MED ORDER — DIPHENHYDRAMINE HCL 50 MG/ML IJ SOLN: 12.5000 mg | INTRAMUSCULAR | Status: DC | PRN

## 2015-06-11 MED ORDER — EPHEDRINE 5 MG/ML INJ
10.0000 mg | INTRAVENOUS | Status: DC | PRN
  Filled 2015-06-11: qty 2

## 2015-06-11 MED ORDER — FENTANYL 2.5 MCG/ML BUPIVACAINE 1/10 % EPIDURAL INFUSION (WH - ANES)
14.0000 mL/h | INTRAMUSCULAR | Status: DC | PRN
  Administered 2015-06-12 (×2): 14 mL/h via EPIDURAL
  Filled 2015-06-11: qty 125

## 2015-06-11 MED ORDER — OXYTOCIN 40 UNITS IN LACTATED RINGERS INFUSION - SIMPLE MED: 1.0000 m[IU]/min | INTRAVENOUS | Status: DC

## 2015-06-11 MED ORDER — PHENYLEPHRINE 40 MCG/ML (10ML) SYRINGE FOR IV PUSH (FOR BLOOD PRESSURE SUPPORT)
80.0000 ug | PREFILLED_SYRINGE | INTRAVENOUS | Status: DC | PRN
  Filled 2015-06-11: qty 10
  Filled 2015-06-11: qty 5

## 2015-06-11 MED ORDER — PHENYLEPHRINE 40 MCG/ML (10ML) SYRINGE FOR IV PUSH (FOR BLOOD PRESSURE SUPPORT)
80.0000 ug | PREFILLED_SYRINGE | INTRAVENOUS | Status: DC | PRN
  Filled 2015-06-11: qty 5

## 2015-06-11 MED ORDER — LACTATED RINGERS IV SOLN
500.0000 mL | Freq: Once | INTRAVENOUS | Status: AC
  Administered 2015-06-12: 500 mL via INTRAVENOUS

## 2015-06-11 NOTE — Progress Notes (Signed)
Catherine Osborne is a 40 y.o. [id number] at [redacted]w[redacted]d by ultrasound admitted for induction of labor due to Pre-eclamptic toxemia of pregnancy..  Subjective: No complaints.  Planning epidural  Objective: BP 128/82 mmHg  Pulse 77  Temp(Src) 98.5 F (36.9 C) (Oral)  Resp 18  Ht 5\' 2"  (1.575 m)  Wt 199 lb 6.4 oz (90.447 kg)  BMI 36.46 kg/m2  LMP 09/27/2014 (Exact Date)      FHT:  FHR: 135 bpm, variability: moderate,  accelerations:  Present,  decelerations:  Absent UC:   regular, every 3-5 minutes SVE:   Dilation: 2 Effacement (%): 70 Station: -3 Exam by:: Fatima Fedie cnm  Labs: Lab Results  Component Value Date   WBC 12.3* 06/10/2015   HGB 12.2 06/10/2015   HCT 36.0 06/10/2015   MCV 84.3 06/10/2015   PLT 283 06/10/2015    Assessment / Plan: Induction of labor due to preeclampsia,  progressing well on pitocin  Labor: Progressing normally Preeclampsia:  no signs or symptoms of toxicity Fetal Wellbeing:  Category I Pain Control:  IV pain meds I/D:  n/a Anticipated MOD:  NSVD  [name], CNM 06/11/2015, 10:07 AM

## 2015-06-11 NOTE — Progress Notes (Signed)
Per verbal order at 0915 when R Denney CNM at bedside, continue increasing Pit above 6 mU. New order by Dr Jodi Mourning entered at 1324, not to increase Pitocin above 6 mU. RN adjusted Pitocin back down to 6, per new order. [place], [place] 06/11/2015 1:32 PM

## 2015-06-11 NOTE — Progress Notes (Addendum)
Catherine Osborne is a 40 y.o. [id number] at [redacted]w[redacted]d by LMP admitted for induction of labor due to Hypertension.  Subjective:   Objective: BP 124/83 mmHg  Pulse 75  Temp(Src) 98.2 F (36.8 C) (Oral)  Resp 20  Ht 5\' 2"  (1.575 m)  Wt 199 lb 6.4 oz (90.447 kg)  BMI 36.46 kg/m2  LMP 09/27/2014 (Exact Date)      FHT:  FHR: 135 bpm, variability: moderate,  accelerations:  Present,  decelerations:  Absent UC:   regular, every 2-3 minutes SVE:   Dilation: 4 Effacement (%): 80 Station: -3 Exam by:: [place] rn  Labs: Lab Results  Component Value Date   WBC 12.3* 06/10/2015   HGB 12.2 06/10/2015   HCT 36.0 06/10/2015   MCV 84.3 06/10/2015   PLT 283 06/10/2015    Assessment / Plan: 37 weeks.  Gestational hypertension.  Early active labor.  Prolonged period of time on pitocin.  Will rest for 2 hours off pitocin.  Labor: Progressing normally Preeclampsia:  labs stable Fetal Wellbeing:  Category I Pain Control:  IV I/D:  n/a Anticipated MOD:  NSVD  Catherine Osborne A 06/11/2015, 7:21 PM

## 2015-06-11 NOTE — Anesthesia Preprocedure Evaluation (Addendum)
Anesthesia Evaluation    Airway Mallampati: I       Dental no notable dental hx.    Pulmonary asthma (steroids) , COPD,  COPD inhaler, former smoker,    Pulmonary exam normal        Cardiovascular Normal cardiovascular exam  '08 ECHO: normal LVF and valves   Neuro/Psych    GI/Hepatic   Endo/Other    Renal/GU      Musculoskeletal   Abdominal Normal abdominal exam  (+)   Peds  Hematology plt 283k   Anesthesia Other Findings   Reproductive/Obstetrics                           Anesthesia Physical Anesthesia Plan  ASA: II  Anesthesia Plan: Epidural   Post-op Pain Management:    Induction:   Airway Management Planned:   Additional Equipment:   Intra-op Plan:   Post-operative Plan:   Informed Consent:   Plan Discussed with:   Anesthesia Plan Comments:         Anesthesia Quick Evaluation

## 2015-06-11 NOTE — Progress Notes (Signed)
RN notified CNM Kandis Cocking of [place] coming out at 1600. Per phone order at 1608, please increase Pit by 2 now, then continue to increase 1x1 to establish adequate labor pattern. Will continue to monitor and increase Pit as able. [place], [place] 06/11/2015 [phone]

## 2015-06-11 NOTE — Progress Notes (Signed)
CSW acknowledges consult and will assess after patient delivers.

## 2015-06-12 ENCOUNTER — Inpatient Hospital Stay (HOSPITAL_COMMUNITY): Payer: Medicaid Other | Admitting: Anesthesiology

## 2015-06-12 ENCOUNTER — Encounter (HOSPITAL_COMMUNITY): Payer: Self-pay | Admitting: General Practice

## 2015-06-12 DIAGNOSIS — O139 Gestational [pregnancy-induced] hypertension without significant proteinuria, unspecified trimester: Secondary | ICD-10-CM | POA: Diagnosis present

## 2015-06-12 LAB — CBC
HEMATOCRIT: 37.8 % (ref 36.0–46.0)
HEMOGLOBIN: 12.8 g/dL (ref 12.0–15.0)
MCH: 28.6 pg (ref 26.0–34.0)
MCHC: 33.9 g/dL (ref 30.0–36.0)
MCV: 84.6 fL (ref 78.0–100.0)
Platelets: 293 10*3/uL (ref 150–400)
RBC: 4.47 MIL/uL (ref 3.87–5.11)
RDW: 14.7 % (ref 11.5–15.5)
WBC: 10 10*3/uL (ref 4.0–10.5)

## 2015-06-12 LAB — PROTEIN / CREATININE RATIO, URINE
CREATININE, URINE: 13 mg/dL
PROTEIN CREATININE RATIO: 1.62 mg/mg{creat} — AB (ref 0.00–0.15)
Total Protein, Urine: 21 mg/dL

## 2015-06-12 MED ORDER — METHYLERGONOVINE MALEATE 0.2 MG/ML IJ SOLN
0.2000 mg | Freq: Once | INTRAMUSCULAR | Status: AC
  Administered 2015-06-12: 0.2 mg via INTRAMUSCULAR

## 2015-06-12 MED ORDER — LABETALOL HCL 100 MG PO TABS
200.0000 mg | ORAL_TABLET | Freq: Three times a day (TID) | ORAL | Status: DC
  Administered 2015-06-12 – 2015-06-14 (×6): 200 mg via ORAL
  Filled 2015-06-12 (×6): qty 2

## 2015-06-12 MED ORDER — PREDNISONE 20 MG PO TABS
40.0000 mg | ORAL_TABLET | Freq: Every day | ORAL | Status: DC
  Filled 2015-06-12: qty 2

## 2015-06-12 MED ORDER — LIDOCAINE HCL (PF) 1 % IJ SOLN
INTRAMUSCULAR | Status: DC | PRN
  Administered 2015-06-12 (×2): 8 mL via EPIDURAL

## 2015-06-12 MED ORDER — HYDRALAZINE HCL 20 MG/ML IJ SOLN: 10.0000 mg | Freq: Once | INTRAMUSCULAR | Status: DC | PRN

## 2015-06-12 MED ORDER — IBUPROFEN 600 MG PO TABS: 600.0000 mg | ORAL_TABLET | Freq: Four times a day (QID) | ORAL | Status: DC | PRN

## 2015-06-12 MED ORDER — METHYLERGONOVINE MALEATE 0.2 MG/ML IJ SOLN
INTRAMUSCULAR | Status: AC
  Administered 2015-06-12: 0.2 mg via INTRAMUSCULAR
  Filled 2015-06-12: qty 1

## 2015-06-12 MED ORDER — VALACYCLOVIR HCL 500 MG PO TABS
500.0000 mg | ORAL_TABLET | Freq: Every day | ORAL | Status: DC
  Administered 2015-06-12: 500 mg via ORAL
  Filled 2015-06-12 (×2): qty 1

## 2015-06-12 MED ORDER — LABETALOL HCL 100 MG PO TABS: 200.0000 mg | ORAL_TABLET | Freq: Three times a day (TID) | ORAL | Status: DC

## 2015-06-12 MED ORDER — LORATADINE 10 MG PO TABS
10.0000 mg | ORAL_TABLET | Freq: Every day | ORAL | Status: DC
  Administered 2015-06-12 – 2015-06-13 (×2): 10 mg via ORAL
  Filled 2015-06-12 (×2): qty 1

## 2015-06-12 MED ORDER — MISOPROSTOL 200 MCG PO TABS
ORAL_TABLET | ORAL | Status: AC
  Filled 2015-06-12: qty 1

## 2015-06-12 MED ORDER — ZOLPIDEM TARTRATE 5 MG PO TABS: 5.0000 mg | ORAL_TABLET | Freq: Every evening | ORAL | Status: DC | PRN

## 2015-06-12 MED ORDER — MISOPROSTOL 200 MCG PO TABS
1000.0000 ug | ORAL_TABLET | Freq: Once | ORAL | Status: AC
  Administered 2015-06-12: 1000 ug via RECTAL

## 2015-06-12 MED ORDER — MAGNESIUM SULFATE 50 % IJ SOLN
2.0000 g/h | INTRAVENOUS | Status: DC
  Administered 2015-06-12: 2 g/h via INTRAVENOUS
  Filled 2015-06-12: qty 80

## 2015-06-12 MED ORDER — PRENATAL MULTIVITAMIN CH
1.0000 | ORAL_TABLET | Freq: Every day | ORAL | Status: DC
  Administered 2015-06-13: 1 via ORAL
  Filled 2015-06-12 (×2): qty 1

## 2015-06-12 MED ORDER — ALBUTEROL SULFATE (2.5 MG/3ML) 0.083% IN NEBU: 3.0000 mL | INHALATION_SOLUTION | RESPIRATORY_TRACT | Status: DC | PRN

## 2015-06-12 MED ORDER — BUDESONIDE 0.5 MG/2ML IN SUSP
1.0000 mg | Freq: Two times a day (BID) | RESPIRATORY_TRACT | Status: DC
  Filled 2015-06-12 (×2): qty 4

## 2015-06-12 MED ORDER — OXYTOCIN 40 UNITS IN LACTATED RINGERS INFUSION - SIMPLE MED
1.0000 m[IU]/min | INTRAVENOUS | Status: DC
  Administered 2015-06-12: 12 m[IU]/min via INTRAVENOUS

## 2015-06-12 MED ORDER — IBUPROFEN 600 MG PO TABS
600.0000 mg | ORAL_TABLET | Freq: Four times a day (QID) | ORAL | Status: DC
  Administered 2015-06-12 – 2015-06-13 (×3): 600 mg via ORAL
  Filled 2015-06-12 (×3): qty 1

## 2015-06-12 MED ORDER — LABETALOL HCL 5 MG/ML IV SOLN: 20.0000 mg | INTRAVENOUS | Status: DC | PRN

## 2015-06-12 MED ORDER — MISOPROSTOL 200 MCG PO TABS
ORAL_TABLET | ORAL | Status: AC
  Filled 2015-06-12: qty 4

## 2015-06-12 NOTE — Progress Notes (Addendum)
Catherine Osborne is a 40 y.o. [id number] at [redacted]w[redacted]d by ultrasound admitted for induction of labor due to Hypertension and Pre-eclamptic toxemia of pregnancy..  Subjective:   Objective: BP 145/85 mmHg  Pulse 73  Temp(Src) 98.6 F (37 C) (Oral)  Resp 16  Ht 5\' 2"  (1.575 m)  Wt 199 lb 6.4 oz (90.447 kg)  BMI 36.46 kg/m2  SpO2 93%  LMP 09/27/2014 (Exact Date)      FHT:  FHR: 145 bpm, variability: moderate,  accelerations:  Present,  decelerations:  Absent UC:   regular, every 2-3 minutes SVE:   Dilation: 5 Effacement (%): 80 Station: -2 Exam by:: Dr. Jodi Mourning   Labs: Lab Results  Component Value Date   WBC 10.0 06/12/2015   HGB 12.8 06/12/2015   HCT 37.8 06/12/2015   MCV 84.6 06/12/2015   PLT 293 06/12/2015    Assessment / Plan: Induction of labor due to preeclampsia,  progressing well on pitocin  Dr. Jodi Mourning sent to room after patient was upset by her understanding of lack of progress.    Labor: Progressing normally Preeclampsia:  blood pressures stable on labetalol Fetal Wellbeing:  Category I Pain Control:  IV pain meds I/D:  n/a Anticipated MOD:  NSVD  [name], CNM 06/12/2015, 11:02 AM

## 2015-06-12 NOTE — Progress Notes (Signed)
Catherine Osborne is a 40 y.o. [id number] at [redacted]w[redacted]d by LMP admitted for induction of labor due to Hypertension.  Subjective:   Objective: BP 121/72 mmHg  Pulse 69  Temp(Src) 98.5 F (36.9 C) (Oral)  Resp 20  Ht 5\' 2"  (1.575 m)  Wt 199 lb 6.4 oz (90.447 kg)  BMI 36.46 kg/m2  LMP 09/27/2014 (Exact Date)      FHT:  FHR: 140 bpm, variability: moderate,  accelerations:  Present,  decelerations:  Absent UC:   regular, every 2-3 minutes SVE:   Dilation: 5 Effacement (%): 80 Station: -2 Exam by:: Dr. Jodi Mourning  AROM:  Moderate amount of clear fluid.  IUPC placed. Labs: Lab Results  Component Value Date   WBC 10.0 06/12/2015   HGB 12.8 06/12/2015   HCT 37.8 06/12/2015   MCV 84.6 06/12/2015   PLT 293 06/12/2015    Assessment / Plan: 37 weeks.  Hypertension.  Active labor.  Increase pitocin per protocol.  Labor: Progressing normally Preeclampsia:  labs stable Fetal Wellbeing:  Category I Pain Control:  IV pain meds I/D:  n/a Anticipated MOD:  NSVD  HARPER,CHARLES A 06/12/2015, 9:32 AM

## 2015-06-12 NOTE — Anesthesia Postprocedure Evaluation (Signed)
Anesthesia Post Note  Patient: Catherine Osborne  Procedure(s) Performed: * No procedures listed *  Patient location during evaluation: Antenatal Anesthesia Type: Epidural Level of consciousness: awake and alert and oriented Pain management: pain level controlled Vital Signs Assessment: post-procedure vital signs reviewed and stable Respiratory status: spontaneous breathing Cardiovascular status: blood pressure returned to baseline Postop Assessment: no headache, no backache, epidural receding, no signs of nausea or vomiting, adequate PO intake and patient able to bend at knees Anesthetic complications: no     Last Vitals:  Filed Vitals:   06/12/15 1528 06/12/15 1529  BP:    Pulse: 87 91  Temp:    Resp:      Last Pain:  Filed Vitals:   06/12/15 1538  PainSc: 0-No pain   Pain Goal: Patients Stated Pain Goal: 7 (06/10/15 2130)               Annella Prowell

## 2015-06-12 NOTE — Lactation Note (Addendum)
This note was copied from a baby's chart. Lactation Consultation Note  Patient Name: Catherine Osborne [id number] Date: 06/12/2015 Reason for consult: Initial assessment   Initial consult with Exp BF mom of 57 hour old infant born at [place].  Mom in Antenatal on MgSO4. Mom report infant has wanted to feed a lot since birth, told her this is wnl. Mom reports she feels infant wants to use her as a pacifier. Discussed nl NB feeding behavior and cluster feeding with mom and advised her to meet his sucking needs at the breast. Mom with small compressible breasts and very large. Long,  bulbous nipples with no areola area noted. Mom reports she made a good milk supply and per mom "I had to take pills to help my milk flow as I kept getting engorged" when she BF her son. Colostrum was easily expressible by mom.     Infant latched easily and was noted to have rhythmic sucking and a few intermittent swallows with breast compression. Enc mom to wait for wide open mouth since nipple is so large and easy for infant to just get on nipples. Mom voiced understanding. Reviewed awakening techniques with mom, mom used with feeding.   Breastfeeding Resource sheet and [place] given to mom. Informed of BF Support Groups, LC Phone # and IP/OP Services. Enc mom to call with questions/concerns.  Mom and RN asked about pumping, advised that it is not needed at this time, will reevaluate with follow up tomorrow. Mom will need larger flanges with pumping.    Maternal Data Formula Feeding for Exclusion: No Does the patient have breastfeeding experience prior to this delivery?: Yes  Feeding Feeding Type: Breast Fed Length of feed: 10 min  LATCH Score/Interventions Latch: Repeated attempts needed to sustain latch, nipple held in mouth throughout feeding, stimulation needed to elicit sucking reflex. Intervention(s): Adjust position;Assist with latch;Breast massage;Breast compression  Audible Swallowing: A few with  stimulation Intervention(s): Skin to skin;Hand expression;Alternate breast massage  Type of Nipple: Everted at rest and after stimulation (Nipples very large and bulbous)  Comfort (Breast/Nipple): Soft / non-tender     Hold (Positioning): Assistance needed to correctly position infant at breast and maintain latch. Intervention(s): Breastfeeding basics reviewed;Support Pillows;Position options;Skin to skin  LATCH Score: 7  Lactation Tools Discussed/Used WIC Program: No (Plans to apply)   Consult Status Consult Status: Follow-up Date: 06/13/15 Follow-up type: In-patient    [name] [name] 06/12/2015, 4:43 PM

## 2015-06-12 NOTE — Anesthesia Procedure Notes (Signed)
Epidural Patient location during procedure: OB Start time: 06/12/2015 9:59 AM End time: 06/12/2015 10:03 AM  Staffing Anesthesiologist: Lyn Hollingshead  Preanesthetic Checklist Completed: patient identified, surgical consent, pre-op evaluation, timeout performed, IV checked, risks and benefits discussed and monitors and equipment checked  Epidural Patient position: sitting Prep: site prepped and draped and DuraPrep Patient monitoring: continuous pulse ox and blood pressure Approach: midline Location: L3-L4 Injection technique: LOR air  Needle:  Needle type: Tuohy  Needle gauge: 17 G Needle length: 9 cm and 9 Needle insertion depth: 6 cm Catheter type: closed end flexible Catheter size: 19 Gauge Catheter at skin depth: 10 cm Test dose: negative and Other  Assessment Sensory level: T9 Events: blood not aspirated, injection not painful, no injection resistance, negative IV test and no paresthesia  Additional Notes Reason for block:procedure for pain

## 2015-06-13 LAB — CBC
HCT: 34.6 % — ABNORMAL LOW (ref 36.0–46.0)
Hemoglobin: 11.9 g/dL — ABNORMAL LOW (ref 12.0–15.0)
MCH: 28.9 pg (ref 26.0–34.0)
MCHC: 34.4 g/dL (ref 30.0–36.0)
MCV: 84 fL (ref 78.0–100.0)
PLATELETS: 266 10*3/uL (ref 150–400)
RBC: 4.12 MIL/uL (ref 3.87–5.11)
RDW: 14.7 % (ref 11.5–15.5)
WBC: 11.8 10*3/uL — AB (ref 4.0–10.5)

## 2015-06-13 MED ORDER — BENZOCAINE-MENTHOL 20-0.5 % EX AERO: 1.0000 "application " | INHALATION_SPRAY | CUTANEOUS | Status: DC | PRN

## 2015-06-13 MED ORDER — ONDANSETRON HCL 4 MG PO TABS: 4.0000 mg | ORAL_TABLET | ORAL | Status: DC | PRN

## 2015-06-13 MED ORDER — OXYCODONE-ACETAMINOPHEN 5-325 MG PO TABS: 2.0000 | ORAL_TABLET | ORAL | Status: DC | PRN

## 2015-06-13 MED ORDER — SENNOSIDES-DOCUSATE SODIUM 8.6-50 MG PO TABS
2.0000 | ORAL_TABLET | ORAL | Status: DC
  Administered 2015-06-13 – 2015-06-14 (×2): 2 via ORAL
  Filled 2015-06-13: qty 2

## 2015-06-13 MED ORDER — ZOLPIDEM TARTRATE 5 MG PO TABS: 5.0000 mg | ORAL_TABLET | Freq: Every evening | ORAL | Status: DC | PRN

## 2015-06-13 MED ORDER — WITCH HAZEL-GLYCERIN EX PADS: 1.0000 "application " | MEDICATED_PAD | CUTANEOUS | Status: DC | PRN

## 2015-06-13 MED ORDER — DIPHENHYDRAMINE HCL 25 MG PO CAPS: 25.0000 mg | ORAL_CAPSULE | Freq: Four times a day (QID) | ORAL | Status: DC | PRN

## 2015-06-13 MED ORDER — SIMETHICONE 80 MG PO CHEW: 80.0000 mg | CHEWABLE_TABLET | ORAL | Status: DC | PRN

## 2015-06-13 MED ORDER — COCONUT OIL OIL: 1.0000 "application " | TOPICAL_OIL | Status: DC | PRN

## 2015-06-13 MED ORDER — DIBUCAINE 1 % RE OINT: 1.0000 "application " | TOPICAL_OINTMENT | RECTAL | Status: DC | PRN

## 2015-06-13 MED ORDER — OXYCODONE-ACETAMINOPHEN 5-325 MG PO TABS: 1.0000 | ORAL_TABLET | ORAL | Status: DC | PRN

## 2015-06-13 MED ORDER — ONDANSETRON HCL 4 MG/2ML IJ SOLN: 4.0000 mg | INTRAMUSCULAR | Status: DC | PRN

## 2015-06-13 MED ORDER — LACTATED RINGERS IV SOLN: INTRAVENOUS | Status: DC

## 2015-06-13 MED ORDER — ACETAMINOPHEN 325 MG PO TABS
650.0000 mg | ORAL_TABLET | ORAL | Status: DC | PRN
  Administered 2015-06-13: 650 mg via ORAL
  Filled 2015-06-13: qty 2

## 2015-06-13 MED ORDER — PRENATAL MULTIVITAMIN CH
1.0000 | ORAL_TABLET | Freq: Every day | ORAL | Status: DC
  Administered 2015-06-13 – 2015-06-14 (×2): 1 via ORAL
  Filled 2015-06-13: qty 1

## 2015-06-13 MED ORDER — TETANUS-DIPHTH-ACELL PERTUSSIS 5-2.5-18.5 LF-MCG/0.5 IM SUSP
0.5000 mL | Freq: Once | INTRAMUSCULAR | Status: AC
  Administered 2015-06-13: 0.5 mL via INTRAMUSCULAR
  Filled 2015-06-13: qty 0.5

## 2015-06-13 MED ORDER — IBUPROFEN 600 MG PO TABS
600.0000 mg | ORAL_TABLET | Freq: Four times a day (QID) | ORAL | Status: DC
  Administered 2015-06-13 – 2015-06-14 (×5): 600 mg via ORAL
  Filled 2015-06-13 (×5): qty 1

## 2015-06-13 NOTE — Progress Notes (Signed)
Post Partum Day 1 Subjective: no complaints  Objective: Blood pressure 128/80, pulse 81, temperature 97.6 F (36.4 C), temperature source Oral, resp. rate 20, height 5\' 2"  (1.575 m), weight 199 lb 6.4 oz (90.447 kg), last menstrual period 09/27/2014, SpO2 97 %, unknown if currently breastfeeding.  Physical Exam:  General: alert and no distress Lochia: appropriate Uterine Fundus: firm Incision: none DVT Evaluation: No evidence of DVT seen on physical exam.   Recent Labs  06/12/15 0742 06/13/15 1020  HGB 12.8 11.9*  HCT 37.8 34.6*    Assessment/Plan: Plan for discharge tomorrow   LOS: 3 days   Sharmayne Jablon A 06/13/2015, 11:03 AM

## 2015-06-13 NOTE — Lactation Note (Signed)
This note was copied from a baby's chart. Lactation Consultation Note  Follow up visit made.  Mom is currently latched well to breast and nursing actively. Baby has been nursing well for 15 minutes and getting sleepy.  Reviewed waking techniques and breast massage.  Instructed to breastfeed with any feeding cue and to call for assist prn. Patient Name: Catherine Osborne [id number] Date: 06/13/2015 Reason for consult: Follow-up assessment   Maternal Data    Feeding Feeding Type: Breast Fed Length of feed: 15 min  LATCH Score/Interventions Latch: Grasps breast easily, tongue down, lips flanged, rhythmical sucking.  Audible Swallowing: A few with stimulation  Type of Nipple: Everted at rest and after stimulation  Comfort (Breast/Nipple): Soft / non-tender     Hold (Positioning): No assistance needed to correctly position infant at breast. Intervention(s): Breastfeeding basics reviewed;Support Pillows  LATCH Score: 9  Lactation Tools Discussed/Used     Consult Status Consult Status: Follow-up Date: 06/14/15 Follow-up type: In-patient    Ave Filter 06/13/2015, 3:19 PM
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
## 2015-06-14 MED ORDER — LABETALOL HCL 200 MG PO TABS: 200.0000 mg | ORAL_TABLET | Freq: Three times a day (TID) | ORAL | Status: DC

## 2015-06-14 MED ORDER — OXYCODONE-ACETAMINOPHEN 5-325 MG PO TABS: 1.0000 | ORAL_TABLET | ORAL | Status: DC | PRN

## 2015-06-14 NOTE — Progress Notes (Signed)
Post Partum Day 2 Subjective: no complaints  Objective: Blood pressure 120/69, pulse 86, temperature 98.1 F (36.7 C), temperature source Oral, resp. rate 18, height 5\' 2"  (1.575 m), weight 199 lb 6.4 oz (90.447 kg), last menstrual period 09/27/2014, SpO2 98 %, unknown if currently breastfeeding.  Physical Exam:  General: alert and no distress Lochia: appropriate Uterine Fundus: firm Incision: none DVT Evaluation: No evidence of DVT seen on physical exam.   Recent Labs  06/12/15 0742 06/13/15 1020  HGB 12.8 11.9*  HCT 37.8 34.6*    Assessment/Plan: PIH.  Stable on Labetalol. Discharge home on Labetalol.   LOS: 4 days   HARPER,CHARLES A 06/14/2015, 6:40 AM

## 2015-06-14 NOTE — Discharge Summary (Signed)
Obstetric Discharge Summary Reason for Admission: Hypertension Prenatal Procedures: NST, Preeclampsia and ultrasound Intrapartum Procedures: spontaneous vaginal delivery Postpartum Procedures: Magnesium sulfate / Labetalol Complications-Operative and Postpartum: none HEMOGLOBIN  Date Value Ref Range Status  06/13/2015 11.9* 12.0 - 15.0 g/dL Final   HCT  Date Value Ref Range Status  06/13/2015 34.6* 36.0 - 46.0 % Final    Physical Exam:  General: alert and no distress Lochia: appropriate Uterine Fundus: firm Incision: none DVT Evaluation: No evidence of DVT seen on physical exam.  Discharge Diagnoses: Term Pregnancy-delivered and Preelampsia  Discharge Information: Date: 06/14/2015 Activity: pelvic rest Diet: routine Medications: PNV, Colace, Iron and Percocet Condition: stable Instructions: refer to practice specific booklet Discharge to: home Follow-up Information    Follow up with MARSHALL,BERNARD A, MD. Schedule an appointment as soon as possible for a visit in 1 week.   Specialty:  Obstetrics and Gynecology   Contact information:   [address] [place] [address] [phone]       Newborn Data: Live born female  Birth Weight: 7 lb 0.7 oz (3196 g) APGAR: 9, 9  Home with mother.  Catherine Osborne A 06/14/2015, 6:46 AM

## 2015-06-14 NOTE — Lactation Note (Signed)
This note was copied from a baby's chart. Lactation Consultation Note  Patient Name: Catherine Osborne [id number] Date: 06/14/2015 Reason for consult: Follow-up assessment   Baby 27 hrs old.  Baby to stay another day to work on feedings.  Mom states baby fed all morning.  Last feeding ended 6 hrs ago.  Mom did say she attempted this am and placed baby on her chest skin to skin.  Baby cueing in the crib while Pediatrician doing her exam.  Mom prefers to latch side lying, hunched over baby.  Adjusted positioning using pillows, explaining importance of her being comfortable during the feeding.  Mom has large diameter and length nipples.  Baby opens widely, and Mom gently helps baby open wider to latch onto areola.  Showed how to pull on chin to uncurl lower lip, and alternate breast compression.  Watched baby rhythmically suck and swallow.  Mom complaining of uterine cramping felt.  Encouraged her to switch to 2nd breast after 20-30 minutes rather than letting him stay on 1 breast for 60 minutes.  Mom to try to feed baby more often today, keeping him skin to skin as much as possible.  To call for help prn and follow up tomorrow.  Mom to be moved with baby to [facility].   Feeding Feeding Type: Breast Fed  LATCH Score/Interventions Latch: Grasps breast easily, tongue down, lips flanged, rhythmical sucking. Intervention(s): Teach feeding cues;Skin to skin Intervention(s): Adjust position;Assist with latch;Breast massage;Breast compression  Audible Swallowing: Spontaneous and intermittent Intervention(s): Hand expression;Skin to skin Intervention(s): Skin to skin;Hand expression;Alternate breast massage  Type of Nipple: Everted at rest and after stimulation (large diameter and length nipple)  Comfort (Breast/Nipple): Filling, red/small blisters or bruises, mild/mod discomfort  Problem noted: Mild/Moderate discomfort Interventions (Mild/moderate discomfort): Hand expression  Hold (Positioning):  Assistance needed to correctly position infant at breast and maintain latch. Intervention(s): Breastfeeding basics reviewed;Support Pillows;Position options;Skin to skin  LATCH Score: 8  Lactation Tools Discussed/Used     Consult Status Consult Status: Follow-up Date: 06/15/15 Follow-up type: In-patient    Broadus John 06/14/2015, 11:22 AM

## 2015-06-17 ENCOUNTER — Ambulatory Visit (HOSPITAL_COMMUNITY): Admission: RE | Admit: 2015-06-17 | Payer: Medicaid Other | Source: Ambulatory Visit

## 2015-06-23 ENCOUNTER — Telehealth (HOSPITAL_COMMUNITY): Payer: Self-pay | Admitting: Lactation Services

## 2015-06-23 NOTE — Telephone Encounter (Signed)
Dr. Anastasio Champion called for [facility] to schedule OP f/u for this Mom. Concerned if baby is transferring milk well at breast. Mom reports baby not waking to BF and will latch for 5 minutes then go to sleep, then 5 minutes later act like baby is starving. Mom started to supplement via Dr. Lanice Shirts instruction. She is supplementing with 45 ml of formula. Mom reports since starting to supplement baby has gained 3 oz since yesterday.  Feeding plan discussed with Mom: BF with each feeding at least every 3 hours, keep baby nursing for 15-20 minutes both breasts if possible. Supplement with 45 ml of EBM or formula increasing as needed to satisfy baby each feeding. Post pump for 15-20 minutes both breasts 4-6 times per day to protect milk supply and to have EBM to supplement. Mom has [facility] from [facility]. OP f/u scheduled for June 1st  Advised of support group on Monday pm/Tuesday am.  Call for questions/concerns.

## 2015-07-02 ENCOUNTER — Telehealth (HOSPITAL_COMMUNITY): Payer: Self-pay

## 2015-07-02 ENCOUNTER — Ambulatory Visit (HOSPITAL_COMMUNITY): Admission: RE | Admit: 2015-07-02 | Payer: Medicaid Other | Source: Ambulatory Visit

## 2015-07-02 NOTE — Telephone Encounter (Signed)
Catherine Osborne was not able to make her appointment today. SHe rescheduled for next Thursday.  She shared with me that she is not able to pay her rent and is concerned that she may be evicted.  All of her possessions are in storage so that she won't lose them if she is evicted. She recently acquired one pan and paper plates so that she can prepare and serve food.  She is unemployed at this time. Family connects has seen her. Will speak with the social worker at [facility] to see if there are any other resources available to her.

## 2015-07-09 ENCOUNTER — Ambulatory Visit (HOSPITAL_COMMUNITY)
Admission: RE | Admit: 2015-07-09 | Discharge: 2015-07-09 | Disposition: A | Discharge status: Home / Self Care | Payer: Medicaid Other | Source: Ambulatory Visit | Attending: Obstetrics | Admitting: Obstetrics

## 2015-07-09 NOTE — Lactation Note (Signed)
Lactation Consult  Mother's reason for visit: Follow up for breast feeding , also the baby is being tx for thrush the last 2 weeks / nystatin swab to the mouth.  Visit Type: feeding assessment due to slow weight gain  Appointment Notes: pt confirmed appt for 6/8  Consult:  Initial Lactation Consultant:  Myer Haff  ______________________________________________________________________________________________________________________________________________  Mother's Name: Catherine Osborne Type of delivery:   Breastfeeding Experience: per mom 1st baby 3 months, and this baby  Maternal Medical Conditions:  Pregnancy induced hypertension, and asthma ( per mom increased during pregnancy)allergies requiring shots  Maternal Medications:  Per mom initially went home on B/P meds , no longer have to take. PNV   ________________________________________________________________________  Breastfeeding History (Post Discharge)  Frequency of breastfeeding: 5 x's in 24 hours  Duration of feeding: 15 - 30 mins   Supplementing : per mom - yes - per Dr. Anastasio Champion after every feeding due to slow weight gain.  Amount = 4-5 oz of expressed milk and formula   Pumping : per mom DEBP from [facility] ( which took me week to obtain when I went home  And had problems with engorgement for 5-7 days , better after I got the DEBP.  3-4 times in 24 hours , after feedings , and obtaining 1 oz total   Infant Intake and Output Assessment  Voids:  >6  24 hrs.  Color:  Clear yellow Stools:  Once a week - per mom greenish - yellow to brown .  [place] asked mom if her Dr. Was aware of the baby stooling only once a week  Per mom yes, Dr. Anastasio Champion is aware.   ________________________________________________________________________  Maternal Breast Assessment  Breast:  Soft Nipple:  Erect Pain level:  0 Pain interventions:  Expressed breast  milk  _______________________________________________________________________ Feeding Assessment/Evaluation  Initial feeding assessment:  Infant's oral assessment:  Variance - LC assessed baby's oral cavity with a gloved finger and noted  A short labial frenulum above the gum line - upper lip stretches with exam and when latched at the breast.  Upper lip needed to be flipped to flanged position when latched.  Short anterior frenulum noted , mobility forward movement over gum line short distance and above the corners of  The mouth.  [place] suspects probably was a major factor to slow weight gain besides mom being engorged for a week.  Noted a thin white spots on tongue - thicker patches in the inner cheeks both sides - baby  Is in the process of tx Nystatin per mom per DR. Gosrani.   Positioning:  Cross cradle Left breast  LATCH documentation:  Latch:  2 = Grasps breast easily, tongue down, lips flanged, rhythmical sucking.  Audible swallowing:  2 = Spontaneous and intermittent  Type of nipple:  2 = Everted at rest and after stimulation  Comfort (Breast/Nipple):  2 = Soft / non-tender  Hold (Positioning):  1 = Assistance needed to correctly position infant at breast and maintain latch  LATCH score: 9  Attached assessment:  Shallow @ 1st - flipped upper lip to flanged position   Lips flanged:  No.  Lips untucked:  Yes.    Suck assessment:  Nutritive and Nonnutritive  Tools:  None  Instructed on use and cleaning of tool:  No.  Pre-feed weight:  4048 g , 8-14.8 oz Post-feed weight:  4052 g , 8-14.9 oz  Amount transferred: 4 ml  Amount supplemented:  None   Additional Feeding Assessment -  Infant's oral assessment:  Variance - see above note   Positioning:  Cross cradle Right breast  LATCH documentation:  Latch:  2 = Grasps breast easily, tongue down, lips flanged, rhythmical sucking.  Audible swallowing:  2 = Spontaneous and intermittent  Type of nipple:  2 = Everted at rest  and after stimulation  Comfort (Breast/Nipple):  2 = Soft / non-tender  Hold (Positioning):  1 = Assistance needed to correctly position infant at breast and maintain latch  LATCH score: 9  Attached assessment:  Deep  Lips flanged:  Yes.    Lips untucked:  Yes.    Suck assessment:  Nutritive  Tools:  None  Instructed on use and cleaning of tool:  Yes.    Pre-feed weight: 4052g , 8-14.9 oz  Post-feed weight:  4078g , 8-15.8 oz  Amount transferred:  26 ml  Amount supplemented:  4 oz of formula from a bottle    Total amount pumped post feed:  Mom did not post pump   Total amount transferred:  30 ml  Total supplement given:  120 ml  Total volume for feeding 5 oz   Lactation Impression: After hearing moms hx and early PP , milk came in before D/C from the hospital.  Mom had B/P issues - post  MagSo4 - went home and was unable to obtain a DEBP from  [facility] for the 1st week PP , and had engorgement for a week. Contributing to slow weighty gain for baby.  And possibly decreased milk supply.   Mom has been adding extra pumping . LC suggested steal Cut oatmeal every day and per mom dislikes.  Mom unable to take fenugreek due to asthma . LC recommended other herbs or Brewers yeast and gave  Per a resource hand out.  Baby latches well both breast , LC suspects early breast feeding may have been challenged due to oral variance  Baby does have thrush , as of now mom is clear. Nipples without S/S and no other yeast infection.  Praised mom for her efforts  Lactation plan of Care:  Call Dr. Ruthann Cancer office for prescription for Diflucan - resource given to mom - recommended tx  Important - protect milk supply and increase volume of milk  Breast needs to be stimulated by baby latching and pumping at least 8 times a day. If you are able add the  Extra pumping. If Pricilla Handler receives a bottle for a feeding - need to pump both breast with a DEBP for 15 -20 mins  If [employer] , plan to  supplement after breast feeding due to low supply.  Pumping after feedings post  pump 10 mins when you can.  Consider attending the [facility] on Mondays at 7 pm or Tuesday 's at 11 am for weight checks and support.

## 2015-08-27 ENCOUNTER — Encounter: Payer: Self-pay | Admitting: Obstetrics

## 2015-08-27 ENCOUNTER — Ambulatory Visit (INDEPENDENT_AMBULATORY_CARE_PROVIDER_SITE_OTHER): Payer: Medicaid Other | Admitting: Obstetrics

## 2015-08-27 DIAGNOSIS — Z3042 Encounter for surveillance of injectable contraceptive: Secondary | ICD-10-CM

## 2015-08-27 MED ORDER — NYSTATIN 100000 UNIT/GM EX CREA: 1.0000 "application " | TOPICAL_CREAM | Freq: Two times a day (BID) | CUTANEOUS | 1 refills | Status: DC

## 2015-08-27 NOTE — Progress Notes (Signed)
Subjective:     Catherine Osborne is a 40 y.o. female who presents for a postpartum visit. She is 8 weeks postpartum following a spontaneous vaginal delivery. I have fully reviewed the prenatal and intrapartum course. The delivery was at 35 gestational weeks. Outcome: spontaneous vaginal delivery. Anesthesia: epidural. Postpartum course has been normal. Baby's course has been normal. Baby is feeding by both breast and bottle - Similac Advance. Bleeding thin lochia. Bowel function is normal. Bladder function is normal. Patient is not sexually active. Contraception method is Depo-Provera injections. Postpartum depression screening: negative.  Tobacco, alcohol and substance abuse history reviewed.  Adult immunizations reviewed including TDAP, rubella and varicella.  The following portions of the patient's history were reviewed and updated as appropriate: allergies, current medications, past family history, past medical history, past social history, past surgical history and problem list.  Review of Systems A comprehensive review of systems was negative.   Objective:    BP 117/80   Pulse 75   Temp 98.1 F (36.7 C)   Wt 178 lb (80.7 kg)   BMI 32.56 kg/m   General:  alert and no distress   Breasts:  inspection negative, no nipple discharge or bleeding, no masses or nodularity palpable  Lungs: clear to auscultation bilaterally  Heart:  regular rate and rhythm, S1, S2 normal, no murmur, click, rub or gallop  Abdomen: soft, non-tender; bowel sounds normal; no masses,  no organomegaly   Vulva:  normal  Vagina: normal vagina  Cervix:  no cervical motion tenderness  Corpus: normal size, contour, position, consistency, mobility, non-tender  Adnexa:  no mass, fullness, tenderness  Rectal Exam: Not performed.          Assessment:     Normal postpartum exam. Pap smear not done at today's visit.    Contraceptive Surveillance  Plan:    1. Contraception: Depo-Provera injections 2. Continue  PVN's 3. Follow up in: 3 months or as needed.   Healthy lifestyle practices reviewed

## 2015-09-07 ENCOUNTER — Ambulatory Visit: Payer: Medicaid Other

## 2015-09-10 ENCOUNTER — Ambulatory Visit (INDEPENDENT_AMBULATORY_CARE_PROVIDER_SITE_OTHER): Payer: Medicaid Other | Admitting: Certified Nurse Midwife

## 2015-09-10 VITALS — Wt 174.4 lb

## 2015-09-10 DIAGNOSIS — Z3042 Encounter for surveillance of injectable contraceptive: Secondary | ICD-10-CM

## 2015-09-10 DIAGNOSIS — Z3202 Encounter for pregnancy test, result negative: Secondary | ICD-10-CM

## 2015-09-10 LAB — POCT URINE PREGNANCY: Preg Test, Ur: NEGATIVE

## 2015-09-10 MED ORDER — MEDROXYPROGESTERONE ACETATE 150 MG/ML IM SUSP
150.0000 mg | Freq: Once | INTRAMUSCULAR | Status: AC
  Administered 2015-09-10: 150 mg via INTRAMUSCULAR

## 2015-09-10 NOTE — Patient Instructions (Signed)
Patient advised to return to office in 3 months for next injection.

## 2015-09-14 NOTE — Progress Notes (Signed)
Pt to RTO in 3 months for next injection.

## 2015-10-08 ENCOUNTER — Emergency Department (HOSPITAL_COMMUNITY)
Admission: EM | Admit: 2015-10-08 | Discharge: 2015-10-08 | Disposition: A | Discharge status: Home / Self Care | Payer: Medicaid Other

## 2015-10-08 ENCOUNTER — Ambulatory Visit (HOSPITAL_COMMUNITY)
Admission: EM | Admit: 2015-10-08 | Discharge: 2015-10-08 | Disposition: A | Discharge status: Home / Self Care | Payer: Medicaid Other | Attending: Internal Medicine | Admitting: Internal Medicine

## 2015-10-08 ENCOUNTER — Encounter (HOSPITAL_COMMUNITY): Payer: Self-pay | Admitting: Emergency Medicine

## 2015-10-08 DIAGNOSIS — J45901 Unspecified asthma with (acute) exacerbation: Secondary | ICD-10-CM

## 2015-10-08 DIAGNOSIS — Z760 Encounter for issue of repeat prescription: Secondary | ICD-10-CM

## 2015-10-08 MED ORDER — DEXAMETHASONE SODIUM PHOSPHATE 10 MG/ML IJ SOLN: 10.0000 mg | Freq: Once | INTRAMUSCULAR | Status: DC

## 2015-10-08 MED ORDER — ALBUTEROL SULFATE HFA 108 (90 BASE) MCG/ACT IN AERS: 1.0000 | INHALATION_SPRAY | Freq: Four times a day (QID) | RESPIRATORY_TRACT | 0 refills | Status: DC | PRN

## 2015-10-08 MED ORDER — DEXAMETHASONE SODIUM PHOSPHATE 10 MG/ML IJ SOLN
10.0000 mg | Freq: Once | INTRAMUSCULAR | Status: AC
  Administered 2015-10-08: 10 mg via INTRAMUSCULAR

## 2015-10-08 MED ORDER — DEXAMETHASONE SODIUM PHOSPHATE 10 MG/ML IJ SOLN
INTRAMUSCULAR | Status: AC
  Filled 2015-10-08: qty 1

## 2015-10-08 MED ORDER — IPRATROPIUM-ALBUTEROL 0.5-2.5 (3) MG/3ML IN SOLN
RESPIRATORY_TRACT | Status: AC
  Filled 2015-10-08: qty 3

## 2015-10-08 MED ORDER — ALBUTEROL SULFATE (2.5 MG/3ML) 0.083% IN NEBU: 2.5000 mg | INHALATION_SOLUTION | Freq: Four times a day (QID) | RESPIRATORY_TRACT | 12 refills | Status: DC | PRN

## 2015-10-08 MED ORDER — PREDNISONE 20 MG PO TABS: ORAL_TABLET | ORAL | 0 refills | Status: DC

## 2015-10-08 MED ORDER — IPRATROPIUM-ALBUTEROL 0.5-2.5 (3) MG/3ML IN SOLN
3.0000 mL | Freq: Once | RESPIRATORY_TRACT | Status: AC
  Administered 2015-10-08: 3 mL via RESPIRATORY_TRACT

## 2015-10-08 NOTE — ED Triage Notes (Signed)
Patient reports a 3 days history of cough, sob, wheezing and central chest soreness with coughing.  Patient has been using inhalers, breathing treatments of her son's and has used her mothers inhalers as well.  Patient is breast feeding .

## 2015-10-08 NOTE — ED Notes (Signed)
No answer X 3.

## 2015-10-08 NOTE — ED Provider Notes (Signed)
CSN: [id number]     Arrival date & time 10/08/15  1901 History   First MD Initiated Contact with Patient 10/08/15 2032     Chief Complaint  Patient presents with  . Shortness of Breath  . Chest Pain   (Consider location/radiation/quality/duration/timing/severity/associated sxs/prior Treatment) HPI  Catherine Osborne is a 41 y.o. female presenting to UC with c/o worsening moderately intermittent dry cough, chest tightness, and wheeze that started about 3 days ago.  She has been using her older son's albuterol but no relief. Pt notes the medicine is about 39 years old so she wonders if it still would work.  Denies fever, chills, n/v/d. She does not currently have a PCP.  Pt notes she is currently breastfeeding her 39 month old son.   Past Medical History:  Diagnosis Date  . Allergy   . Asthma   . Headache   . Pneumonia   . Prior miscarriage with pregnancy in first trimester, antepartum   . Vaginal Pap smear, abnormal    Past Surgical History:  Procedure Laterality Date  . CERVICAL CERCLAGE N/A 02/26/2015   Procedure: CERCLAGE CERVICAL;  Surgeon: Frederico Hamman, MD;  Location: [facility];  Service: Gynecology;  Laterality: N/A;  . DILATION AND CURETTAGE OF UTERUS    . DILATION AND EVACUATION N/A 05/22/2012   Procedure: DILATATION AND EVACUATION;  Surgeon: Frederico Hamman, MD;  Location: [facility];  Service: Gynecology;  Laterality: N/A;  . FOOT SURGERY     Family History  Problem Relation Age of Onset  . Bone cancer Mother   . Breast cancer Maternal Grandmother   . Diabetes    . Hypertension    . Stroke    . Heart attack     Social History  Substance Use Topics  . Smoking status: Former Research scientist (life sciences)  . Smokeless tobacco: Not on file  . Alcohol use 0.6 oz/week    1 Glasses of wine per week     Comment: Jun 01 2015   OB History    Gravida Para Term Preterm AB Living   14 2 1 1 12 1    SAB TAB Ectopic Multiple Live Births   10 2   0 1     Review of Systems  Constitutional:  Negative for appetite change, chills, fatigue and fever.  HENT: Negative for congestion, ear pain, sinus pressure, sneezing and sore throat.   Respiratory: Positive for cough, chest tightness, shortness of breath and wheezing. Negative for stridor.   Cardiovascular: Positive for chest pain (soreness from cough). Negative for palpitations.  Gastrointestinal: Negative for abdominal pain, nausea and vomiting.  Neurological: Negative for dizziness, light-headedness and headaches.    Allergies  Review of patient's allergies indicates no known allergies.  Home Medications   Prior to Admission medications   Medication Sig Start Date End Date Taking? Authorizing Provider  albuterol (PROVENTIL HFA;VENTOLIN HFA) 108 (90 Base) MCG/ACT inhaler Inhale 2 puffs into the lungs every 4 (four) hours as needed for wheezing or shortness of breath. Reported on 06/10/2015    Historical Provider, MD  albuterol (PROVENTIL HFA;VENTOLIN HFA) 108 (90 Base) MCG/ACT inhaler Inhale 1-2 puffs into the lungs every 6 (six) hours as needed for wheezing or shortness of breath. 10/08/15   Noland Fordyce, PA-C  albuterol (PROVENTIL) (2.5 MG/3ML) 0.083% nebulizer solution Take 3 mLs (2.5 mg total) by nebulization every 6 (six) hours as needed for wheezing or shortness of breath. 10/08/15   Noland Fordyce, PA-C  beclomethasone (QVAR) 40 MCG/ACT inhaler  Inhale 1 puff into the lungs 2 (two) times daily. Reported on 06/10/2015    Historical Provider, MD  cetirizine (ZYRTEC) 10 MG tablet Take 1 tablet (10 mg total) by mouth daily. Patient not taking: Reported on 08/27/2015 06/03/15   Jorje Guild, NP  cyclobenzaprine (FLEXERIL) 5 MG tablet Take 1 tablet (5 mg total) by mouth 3 (three) times daily as needed for muscle spasms. Patient not taking: Reported on 06/10/2015 05/13/15   Deirdre C Poe, CNM  labetalol (NORMODYNE) 200 MG tablet Take 1 tablet (200 mg total) by mouth 3 (three) times daily. Patient not taking: Reported on 08/27/2015 06/14/15    Shelly Bombard, MD  nystatin cream (MYCOSTATIN) Apply 1 application topically 2 (two) times daily. 08/27/15   Shelly Bombard, MD  predniSONE (DELTASONE) 20 MG tablet 2 tabs po daily x 3 days 10/08/15   Noland Fordyce, PA-C  Prenatal Vit-Fe Fumarate-FA (PRENATAL MULTIVITAMIN) TABS tablet Take 1 tablet by mouth at bedtime.     Historical Provider, MD   Meds Ordered and Administered this Visit   Medications  ipratropium-albuterol (DUONEB) 0.5-2.5 (3) MG/3ML nebulizer solution 3 mL (3 mLs Nebulization Given 10/08/15 2053)  dexamethasone (DECADRON) injection 10 mg (10 mg Intramuscular Given 10/08/15 2058)    BP 114/76 (BP Location: Right Arm)   Pulse 120   Temp 98.5 F (36.9 C) (Oral)   Resp 24   SpO2 97%  No data found.   Physical Exam  Constitutional: She is oriented to person, place, and time. She appears well-developed and well-nourished. No distress.  Pt sitting on exam bed breastfeeding her baby.   HENT:  Head: Normocephalic and atraumatic.  Eyes: EOM are normal.  Neck: Normal range of motion.  Cardiovascular: Normal rate.   Pulmonary/Chest: Effort normal. No respiratory distress. She has wheezes. She has no rales. She exhibits no tenderness.  Audible wheeze w/o stethoscope, however, pt calm, able to speak in full sentences. Mildly winded between every other sentence.  Musculoskeletal: Normal range of motion.  Neurological: She is alert and oriented to person, place, and time.  Skin: Skin is warm and dry. She is not diaphoretic.  Psychiatric: She has a normal mood and affect. Her behavior is normal.  Nursing note and vitals reviewed.   Urgent Care Course   Clinical Course    Procedures (including critical care time)  Labs Review Labs Reviewed - No data to display  Imaging Review No results found.     MDM   1. Asthma exacerbation   2. Medication refill    Pt presenting with exacerbation of her asthma.  Audible wheeze without using stethoscope. O2 Sat 97% on  RA  Tx in UC: Decadron 10mg  IM, duoneb.  Pt notes she feels much better. Lung sounds: wheezing has resolved.  Pt safe for discharge home. Rx: Albuterol inhaler and nebulizer solution, prednisone 40mg  for 3 days Advised pt both medications can be excreted through breast milk. Encouraged pt to speak with OB/GYN tomorrow for guidance on breastfeeding as prednisone treatment will only be short term. Explained pt's ability to breath and need for medication outweigh potential side effects baby may have, however, still encouraged to speak with OB/GYN if she does not want to use formula for the next 3-4 days.  Discussed symptoms that warrant emergent care in the ED. Patient verbalized understanding and agreement with treatment plan.     Noland Fordyce, PA-C 10/08/15 2131

## 2015-10-08 NOTE — Discharge Instructions (Signed)
° °  Studies show albuterol and prednisone can be passed through the breast milk.  Some studies show decreased grown and increase in hormones in some breast fed infants when prednisone is being taken.  These studies do not show how much prednisone was being given or how long the treatments were for.    It is best to discuss breastfeeding while on these medications with your OB/GYN in the morning.  If your original OB/GYN does not work at the same clinic anymore, you may still try calling that office as other nurses or providers may be able to help you decide if you should formula feed for a few days or if you can continue to breastfeed without changing anything.

## 2015-10-30 LAB — PROCEDURE REPORT - SCANNED: PAP SMEAR: NEGATIVE

## 2015-11-30 ENCOUNTER — Other Ambulatory Visit (HOSPITAL_COMMUNITY)
Admission: RE | Admit: 2015-11-30 | Discharge: 2015-11-30 | Disposition: A | Discharge status: Home / Self Care | Payer: Medicaid Other | Source: Ambulatory Visit | Attending: Obstetrics | Admitting: Obstetrics

## 2015-11-30 ENCOUNTER — Ambulatory Visit (INDEPENDENT_AMBULATORY_CARE_PROVIDER_SITE_OTHER): Payer: Medicaid Other | Admitting: Obstetrics

## 2015-11-30 ENCOUNTER — Encounter: Payer: Self-pay | Admitting: Obstetrics

## 2015-11-30 VITALS — BP 120/82 | HR 87 | Temp 98.2°F | Ht 62.0 in | Wt 188.6 lb

## 2015-11-30 DIAGNOSIS — E6609 Other obesity due to excess calories: Secondary | ICD-10-CM

## 2015-11-30 DIAGNOSIS — Z3009 Encounter for other general counseling and advice on contraception: Secondary | ICD-10-CM | POA: Diagnosis not present

## 2015-11-30 DIAGNOSIS — Z01419 Encounter for gynecological examination (general) (routine) without abnormal findings: Secondary | ICD-10-CM | POA: Diagnosis not present

## 2015-11-30 DIAGNOSIS — A6009 Herpesviral infection of other urogenital tract: Secondary | ICD-10-CM

## 2015-11-30 DIAGNOSIS — Z124 Encounter for screening for malignant neoplasm of cervix: Secondary | ICD-10-CM

## 2015-11-30 DIAGNOSIS — Z01411 Encounter for gynecological examination (general) (routine) with abnormal findings: Secondary | ICD-10-CM | POA: Insufficient documentation

## 2015-11-30 DIAGNOSIS — Z1151 Encounter for screening for human papillomavirus (HPV): Secondary | ICD-10-CM | POA: Insufficient documentation

## 2015-11-30 MED ORDER — VALACYCLOVIR HCL 500 MG PO TABS: 500.0000 mg | ORAL_TABLET | Freq: Two times a day (BID) | ORAL | 99 refills | Status: DC

## 2015-11-30 NOTE — Progress Notes (Signed)
Subjective:        Catherine Osborne is a 40 y.o. female here for a routine exam.  Current complaints: None.  Gaining too much weight on Depo Provera.  Wants to switch to another birth control.    Personal health questionnaire:  Is patient Catherine Osborne, have a family history of breast and/or ovarian cancer: no Is there a family history of uterine cancer diagnosed at age < 36, gastrointestinal cancer, urinary tract cancer, family member who is a Field seismologist syndrome-associated carrier: no Is the patient overweight and hypertensive, family history of diabetes, personal history of gestational diabetes, preeclampsia or PCOS: no Is patient over 91, have PCOS,  family history of premature CHD under age 6, diabetes, smoke, have hypertension or peripheral artery disease:  no At any time, has a partner hit, kicked or otherwise hurt or frightened you?: no Over the past 2 weeks, have you felt down, depressed or hopeless?: no Over the past 2 weeks, have you felt little interest or pleasure in doing things?:no   Gynecologic History No LMP recorded. Patient has had an injection. Contraception: Depo-Provera injections Last Pap: 2016. Results were: normal Last mammogram: n/a. Results were: n/a  Obstetric History OB History  Gravida Para Term Preterm AB Living  14 2 1 1 12 1   SAB TAB Ectopic Multiple Live Births  10 2   0 1    # Outcome Date GA Lbr Len/2nd Weight Sex Delivery Anes PTL Lv  14 Term 06/12/15 [redacted]w[redacted]d 19:15 / 00:19 7 lb 0.7 oz (3.196 kg) M Vag-Spont EPI  LIV  13 Preterm  [redacted]w[redacted]d    Vag-Spont     12 SAB           11 SAB           10 SAB           9 SAB           8 SAB           7 SAB           6 SAB           5 SAB           4 SAB           3 TAB           2 TAB           1 SAB               Past Medical History:  Diagnosis Date  . Allergy   . Asthma   . Headache   . 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 Pneumonia   . Prior miscarriage with pregnancy in first trimester,  antepartum   . Vaginal Pap smear, abnormal     Past Surgical History:  Procedure Laterality Date  . CERVICAL CERCLAGE N/A 02/26/2015   Procedure: CERCLAGE CERVICAL;  Surgeon: Frederico Hamman, MD;  Location: [facility];  Service: Gynecology;  Laterality: N/A;  . DILATION AND CURETTAGE OF UTERUS    . DILATION AND EVACUATION N/A 05/22/2012   Procedure: DILATATION AND EVACUATION;  Surgeon: Frederico Hamman, MD;  Location: [facility];  Service: Gynecology;  Laterality: N/A;  . FOOT SURGERY       Current Outpatient Prescriptions:  .  albuterol (PROVENTIL HFA;VENTOLIN HFA) 108 (90 Base) MCG/ACT inhaler, Inhale 2 puffs into the lungs every 4 (four) hours as needed for wheezing or shortness of breath. Reported on  06/10/2015, Disp: , Rfl:  .  albuterol (PROVENTIL HFA;VENTOLIN HFA) 108 (90 Base) MCG/ACT inhaler, Inhale 1-2 puffs into the lungs every 6 (six) hours as needed for wheezing or shortness of breath., Disp: 1 Inhaler, Rfl: 0 .  albuterol (PROVENTIL) (2.5 MG/3ML) 0.083% nebulizer solution, Take 3 mLs (2.5 mg total) by nebulization every 6 (six) hours as needed for wheezing or shortness of breath., Disp: 75 mL, Rfl: 12 .  beclomethasone (QVAR) 40 MCG/ACT inhaler, Inhale 1 puff into the lungs 2 (two) times daily. Reported on 06/10/2015, Disp: , Rfl:  .  Prenatal Vit-Fe Fumarate-FA (PRENATAL MULTIVITAMIN) TABS tablet, Take 1 tablet by mouth at bedtime. , Disp: , Rfl:  No Known Allergies  Social History  Substance Use Topics  . Smoking status: Former Research scientist (life sciences)  . Smokeless tobacco: Never Used  . Alcohol use 0.6 oz/week    1 Glasses of wine per week     Comment: rare    Family History  Problem Relation Age of Onset  . Adopted: Yes  . Bone cancer Mother   . Breast cancer Maternal Grandmother   . Diabetes    . Hypertension    . Stroke    . Heart attack        Review of Systems  Constitutional: positive for excessive weight gain.  Negative for fatigue. Respiratory: negative for cough and  wheezing Cardiovascular: negative for chest pain, fatigue and palpitations Gastrointestinal: negative for abdominal pain and change in bowel habits Musculoskeletal:negative for myalgias Neurological: negative for gait problems and tremors Behavioral/Psych: negative for abusive relationship, depression Endocrine: negative for temperature intolerance   Genitourinary:negative for abnormal menstrual periods, genital lesions, hot flashes, sexual problems and vaginal discharge Integument/breast: negative for breast lump, breast tenderness, nipple discharge and skin lesion(s)    Objective:       BP 120/82   Pulse 87   Temp 98.2 F (36.8 C)   Ht 5\' 2"  ([id number] m)   Wt 188 lb 9.6 oz (85.5 kg)   Breastfeeding? Yes   BMI 34.50 kg/m  General:   alert  Skin:   no rash or abnormalities  Lungs:   clear to auscultation bilaterally  Heart:   regular rate and rhythm, S1, S2 normal, no murmur, click, rub or gallop  Breasts:   normal without suspicious masses, skin or nipple changes or axillary nodes  Abdomen:  normal findings: no organomegaly, soft, non-tender and no hernia  Pelvis:  External genitalia: normal general appearance Urinary system: urethral meatus normal and bladder without fullness, nontender Vaginal: normal without tenderness, induration or masses Cervix: normal appearance Adnexa: normal bimanual exam Uterus: anteverted and non-tender, normal size   Lab Review Urine pregnancy test Labs reviewed yes Radiologic studies reviewed no  50% of 20 min visit spent on counseling and coordination of care.   Assessment:    Healthy female exam.    Contraceptive counseling and advice.  Wants to stop Depo because of excessive weight gain.  Considering Nexplanon.   Plan:    Education reviewed: calcium supplements, low fat, low cholesterol diet, safe sex/STD prevention and self breast exams. Contraception: Nexplanon. Follow up in: 1 week.   No orders of the defined types were placed in  this encounter.  No orders of the defined types were placed in this encounter.    [name], female   DOB: Mar 04, 1975, 40 y.o.   MRN: [id number]

## 2015-12-03 ENCOUNTER — Ambulatory Visit (INDEPENDENT_AMBULATORY_CARE_PROVIDER_SITE_OTHER): Payer: Medicaid Other

## 2015-12-03 ENCOUNTER — Encounter (HOSPITAL_COMMUNITY): Payer: Self-pay | Admitting: Emergency Medicine

## 2015-12-03 ENCOUNTER — Other Ambulatory Visit: Payer: Self-pay | Admitting: Obstetrics

## 2015-12-03 ENCOUNTER — Ambulatory Visit (HOSPITAL_COMMUNITY)
Admission: EM | Admit: 2015-12-03 | Discharge: 2015-12-03 | Disposition: A | Discharge status: Home / Self Care | Payer: Medicaid Other | Attending: Emergency Medicine | Admitting: Emergency Medicine

## 2015-12-03 DIAGNOSIS — N760 Acute vaginitis: Principal | ICD-10-CM

## 2015-12-03 DIAGNOSIS — B9689 Other specified bacterial agents as the cause of diseases classified elsewhere: Secondary | ICD-10-CM

## 2015-12-03 DIAGNOSIS — J4541 Moderate persistent asthma with (acute) exacerbation: Secondary | ICD-10-CM

## 2015-12-03 LAB — NUSWAB VG+, CANDIDA 6SP
Atopobium vaginae: HIGH Score — AB
BVAB 2: HIGH {score} — AB
CANDIDA PARAPSILOSIS, NAA: NEGATIVE
CHLAMYDIA TRACHOMATIS, NAA: NEGATIVE
Candida albicans, NAA: NEGATIVE
Candida glabrata, NAA: NEGATIVE
Candida krusei, NAA: NEGATIVE
Candida lusitaniae, NAA: NEGATIVE
Candida tropicalis, NAA: NEGATIVE
MEGASPHAERA 1: HIGH {score} — AB
Neisseria gonorrhoeae, NAA: NEGATIVE
TRICH VAG BY NAA: NEGATIVE

## 2015-12-03 MED ORDER — METRONIDAZOLE 500 MG PO TABS: 500.0000 mg | ORAL_TABLET | Freq: Two times a day (BID) | ORAL | 2 refills | Status: DC

## 2015-12-03 MED ORDER — IPRATROPIUM-ALBUTEROL 0.5-2.5 (3) MG/3ML IN SOLN
RESPIRATORY_TRACT | Status: AC
  Filled 2015-12-03: qty 3

## 2015-12-03 NOTE — Discharge Instructions (Signed)
You have a virus that is flaring your asthma. Plain Robitussin and plain Mucinex is okay to take while breast-feeding. Use your albuterol every 4 hours for the next 2 days. Make sure you are drinking plenty of fluids. Follow-up as needed.

## 2015-12-03 NOTE — ED Notes (Signed)
Bed: UC07 Expected date:  Expected time:  Means of arrival:  Comments: Hold

## 2015-12-03 NOTE — ED Triage Notes (Signed)
Pt c/o SOB onset this am associated w/dyspnea, CP due prod cough  Taking albuterol inhaler w/no relief.   A&O x4... NAD

## 2015-12-03 NOTE — ED Provider Notes (Signed)
[place]    CSN: [id number] Arrival date & time: 12/03/15  1641     History   Chief Complaint Chief Complaint  Patient presents with  . Shortness of Breath    HPI NYSHA Osborne is a 40 y.o. female.   HPI  She is a 40 year old woman here for evaluation of shortness of breath. Her symptoms started about 3 days ago with nasal congestion, rhinorrhea, sore throat, cough. She has also had wheezing and shortness of breath. She has been using her inhaler and nebulizer at home with temporary improvement. No known fevers. No nausea or vomiting. Shortness of breath seems to be getting worse.  She is breast-feeding.  Past Medical History:  Diagnosis Date  . Allergy   . Asthma   . Headache   . 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 Pneumonia   . Prior miscarriage with pregnancy in first trimester, antepartum   . Vaginal Pap smear, abnormal     Patient Active Problem List   Diagnosis Date Noted  . NSVD (normal spontaneous vaginal delivery) 06/13/2015  . Pregnancy induced hypertension 06/12/2015  . Preeclampsia 06/10/2015  . Short cervix affecting pregnancy 02/25/2015  . Advanced maternal age in multigravida 02/12/2015  . Left thyroid nodule 10/25/2011    Past Surgical History:  Procedure Laterality Date  . CERVICAL CERCLAGE N/A 02/26/2015   Procedure: CERCLAGE CERVICAL;  Surgeon: Frederico Hamman, MD;  Location: [facility];  Service: Gynecology;  Laterality: N/A;  . DILATION AND CURETTAGE OF UTERUS    . DILATION AND EVACUATION N/A 05/22/2012   Procedure: DILATATION AND EVACUATION;  Surgeon: Frederico Hamman, MD;  Location: [facility];  Service: Gynecology;  Laterality: N/A;  . FOOT SURGERY      OB History    Gravida Para Term Preterm AB Living   14 2 1 1 12 1    SAB TAB Ectopic Multiple Live Births   10 2   0 1       Home Medications    Prior to Admission medications   Medication Sig Start Date End Date Taking? Authorizing Provider  albuterol (PROVENTIL  HFA;VENTOLIN HFA) 108 (90 Base) MCG/ACT inhaler Inhale 2 puffs into the lungs every 4 (four) hours as needed for wheezing or shortness of breath. Reported on 06/10/2015   Yes Historical Provider, MD  beclomethasone (QVAR) 40 MCG/ACT inhaler Inhale 1 puff into the lungs 2 (two) times daily. Reported on 06/10/2015   Yes Historical Provider, MD  albuterol (PROVENTIL HFA;VENTOLIN HFA) 108 (90 Base) MCG/ACT inhaler Inhale 1-2 puffs into the lungs every 6 (six) hours as needed for wheezing or shortness of breath. 10/08/15   Noland Fordyce, PA-C  albuterol (PROVENTIL) (2.5 MG/3ML) 0.083% nebulizer solution Take 3 mLs (2.5 mg total) by nebulization every 6 (six) hours as needed for wheezing or shortness of breath. 10/08/15   Noland Fordyce, PA-C  metroNIDAZOLE (FLAGYL) 500 MG tablet Take 1 tablet (500 mg total) by mouth 2 (two) times daily. 12/03/15   Shelly Bombard, MD  valACYclovir (VALTREX) 500 MG tablet Take 1 tablet (500 mg total) by mouth 2 (two) times daily. Take for 3 days prn. 11/30/15   Shelly Bombard, MD    Family History Family History  Problem Relation Age of Onset  . Adopted: Yes  . Bone cancer Mother   . Breast cancer Maternal Grandmother   . Diabetes    . Hypertension    . Stroke    . Heart attack  Social History Social History  Substance Use Topics  . Smoking status: Former Research scientist (life sciences)  . Smokeless tobacco: Never Used  . Alcohol use 0.6 oz/week    1 Glasses of wine per week     Comment: rare     Allergies   Review of patient's allergies indicates no known allergies.   Review of Systems Review of Systems As in history of present illness  Physical Exam Triage Vital Signs ED Triage Vitals  Enc Vitals Group     BP 12/03/15 1743 130/88     Pulse Rate 12/03/15 1743 107     Resp 12/03/15 1743 26     Temp 12/03/15 1743 98.6 F (37 C)     Temp Source 12/03/15 1743 Oral     SpO2 12/03/15 1743 100 %     Weight --      Height --      Head Circumference --      Peak Flow --       Pain Score 12/03/15 1745 9     Pain Loc --      Pain Edu? --      Excl. in [place]? --    No data found.   Updated Vital Signs BP 130/88 (BP Location: Left Arm)   Pulse 107   Temp 98.6 F (37 C) (Oral)   Resp 26   SpO2 100%   Visual Acuity Right Eye Distance:   Left Eye Distance:   Bilateral Distance:    Right Eye Near:   Left Eye Near:    Bilateral Near:     Physical Exam  Constitutional: She is oriented to person, place, and time. She appears well-developed and well-nourished. No distress.  HENT:  Mouth/Throat: No oropharyngeal exudate.  Mild oropharyngeal erythema.  Neck: Neck supple.  Cardiovascular: Normal rate, regular rhythm and normal heart sounds.   No murmur heard. Pulmonary/Chest: Breath sounds normal. No respiratory distress. She has no wheezes. She has no rales.  After DuoNeb lungs are clear. She does still appear to be short of breath.  Lymphadenopathy:    She has no cervical adenopathy.  Neurological: She is alert and oriented to person, place, and time.     UC Treatments / Results  Labs (all labs ordered are listed, but only abnormal results are displayed) Labs Reviewed - No data to display  EKG  EKG Interpretation None       Radiology Dg Chest 2 View  Result Date: 12/03/2015 CLINICAL DATA:  Sick for 2 days, short of breath and wheezing EXAM: CHEST  2 VIEW COMPARISON:  02/20/2013 FINDINGS: The heart size and mediastinal contours are within normal limits. Both lungs are clear. The visualized skeletal structures are unremarkable. IMPRESSION: No active cardiopulmonary disease. Electronically Signed   By: Donavan Foil M.D.   On: 12/03/2015 18:42    Procedures Procedures (including critical care time)  Medications Ordered in UC Medications - No data to display   Initial Impression / Assessment and Plan / UC Course  I have reviewed the triage vital signs and the nursing notes.  Pertinent labs & imaging results that were available during  my care of the patient were reviewed by me and considered in my medical decision making (see chart for details).  Clinical Course    Okay to continue regular Robitussin and Mucinex for symptomatic control. Discussed regular albuterol use for the next 2 days. Limited options for medications as she is breast-feeding. Follow-up as needed.  Final Clinical Impressions(s) / UC  Diagnoses   Final diagnoses:  Moderate persistent asthma with exacerbation    New Prescriptions Current Discharge Medication List       Melony Overly, MD 12/03/15 [phone]

## 2015-12-04 ENCOUNTER — Ambulatory Visit (HOSPITAL_COMMUNITY)
Admission: EM | Admit: 2015-12-04 | Discharge: 2015-12-04 | Disposition: A | Discharge status: Home / Self Care | Payer: Medicaid Other | Attending: Internal Medicine | Admitting: Internal Medicine

## 2015-12-04 ENCOUNTER — Encounter (HOSPITAL_COMMUNITY): Payer: Self-pay | Admitting: *Deleted

## 2015-12-04 DIAGNOSIS — Z87891 Personal history of nicotine dependence: Secondary | ICD-10-CM | POA: Diagnosis not present

## 2015-12-04 DIAGNOSIS — J029 Acute pharyngitis, unspecified: Secondary | ICD-10-CM | POA: Insufficient documentation

## 2015-12-04 DIAGNOSIS — J45901 Unspecified asthma with (acute) exacerbation: Secondary | ICD-10-CM | POA: Diagnosis not present

## 2015-12-04 DIAGNOSIS — Z79899 Other long term (current) drug therapy: Secondary | ICD-10-CM | POA: Insufficient documentation

## 2015-12-04 DIAGNOSIS — R062 Wheezing: Secondary | ICD-10-CM | POA: Diagnosis present

## 2015-12-04 LAB — CYTOLOGY - PAP
Diagnosis: NEGATIVE
HPV: NOT DETECTED

## 2015-12-04 LAB — POCT RAPID STREP A: Streptococcus, Group A Screen (Direct): NEGATIVE

## 2015-12-04 MED ORDER — METHYLPREDNISOLONE SODIUM SUCC 125 MG IJ SOLR
125.0000 mg | Freq: Once | INTRAMUSCULAR | Status: AC
  Administered 2015-12-04: 125 mg via INTRAMUSCULAR

## 2015-12-04 MED ORDER — IPRATROPIUM-ALBUTEROL 0.5-2.5 (3) MG/3ML IN SOLN
3.0000 mL | Freq: Once | RESPIRATORY_TRACT | Status: AC
  Administered 2015-12-04: 3 mL via RESPIRATORY_TRACT

## 2015-12-04 MED ORDER — PREDNISONE 50 MG PO TABS: 50.0000 mg | ORAL_TABLET | Freq: Every day | ORAL | 0 refills | Status: DC

## 2015-12-04 MED ORDER — METHYLPREDNISOLONE SODIUM SUCC 125 MG IJ SOLR
INTRAMUSCULAR | Status: AC
  Filled 2015-12-04: qty 2

## 2015-12-04 MED ORDER — IPRATROPIUM-ALBUTEROL 0.5-2.5 (3) MG/3ML IN SOLN
RESPIRATORY_TRACT | Status: AC
  Filled 2015-12-04: qty 3

## 2015-12-04 NOTE — Discharge Instructions (Addendum)
Duoneb breathing treatment was given today, and a throat culture was collected.  Rapid strep test was negative; culture result will be available in a few days.  Chest xray yesterday was negative for pneumonia.  Injection of solumedrol (a steroid) was given today for asthma exacerbation and prednisone prescription was sent to the [facility] on [address] and [address].  Check with infant's health care provider about breastfeeding while taking prednisone. Anticipate gradual improvement in cough/sore throat/well being over the next several days.  Recheck as needed.

## 2015-12-04 NOTE — ED Triage Notes (Signed)
Pt  Ws  Seen   yest  ucc   Continues  To  Have  Coughing /   Sweating       Shortness  Of  Breath      And  Wheezing       Pain in  Chest  Is   Worse  When she  Coughs  Or  Takes  Deep  Breath   sorethroat  As   Well

## 2015-12-04 NOTE — ED Provider Notes (Signed)
[place]    CSN: [id number] Arrival date & time: 12/04/15  1543     History   Chief Complaint Chief Complaint  Patient presents with  . Wheezing    HPI MACII ARD is a 40 y.o. female. She presents today with central chest tightness and wheezing, mild dyspnea. Has discomfort in the central chest with cough, also throat is sore with coughing. No fever. Was seen yesterday with asthma exacerbation. She is breast-feeding a 84-month-old infant. Some sore throat. Little bit of runny/congested nose. Onset of symptoms in the last 48 hours.    HPI  Past Medical History:  Diagnosis Date  . Allergy   . Asthma   . Headache   . 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 Pneumonia   . Prior miscarriage with pregnancy in first trimester, antepartum   . Vaginal Pap smear, abnormal     Patient Active Problem List   Diagnosis Date Noted  . NSVD (normal spontaneous vaginal delivery) 06/13/2015  . Pregnancy induced hypertension 06/12/2015  . Preeclampsia 06/10/2015  . Short cervix affecting pregnancy 02/25/2015  . Advanced maternal age in multigravida 02/12/2015  . Left thyroid nodule 10/25/2011    Past Surgical History:  Procedure Laterality Date  . CERVICAL CERCLAGE N/A 02/26/2015   Procedure: CERCLAGE CERVICAL;  Surgeon: Frederico Hamman, MD;  Location: [facility];  Service: Gynecology;  Laterality: N/A;  . DILATION AND CURETTAGE OF UTERUS    . DILATION AND EVACUATION N/A 05/22/2012   Procedure: DILATATION AND EVACUATION;  Surgeon: Frederico Hamman, MD;  Location: [facility];  Service: Gynecology;  Laterality: N/A;  . FOOT SURGERY      OB History    Gravida Para Term Preterm AB Living   14 2 1 1 12 1    SAB TAB Ectopic Multiple Live Births   10 2   0 1       Home Medications    Prior to Admission medications   Medication Sig Start Date End Date Taking? Authorizing Provider  albuterol (PROVENTIL HFA;VENTOLIN HFA) 108 (90 Base) MCG/ACT inhaler Inhale 2 puffs into the  lungs every 4 (four) hours as needed for wheezing or shortness of breath. Reported on 06/10/2015    Historical Provider, MD  albuterol (PROVENTIL HFA;VENTOLIN HFA) 108 (90 Base) MCG/ACT inhaler Inhale 1-2 puffs into the lungs every 6 (six) hours as needed for wheezing or shortness of breath. 10/08/15   Noland Fordyce, PA-C  albuterol (PROVENTIL) (2.5 MG/3ML) 0.083% nebulizer solution Take 3 mLs (2.5 mg total) by nebulization every 6 (six) hours as needed for wheezing or shortness of breath. 10/08/15   Noland Fordyce, PA-C  beclomethasone (QVAR) 40 MCG/ACT inhaler Inhale 1 puff into the lungs 2 (two) times daily. Reported on 06/10/2015    Historical Provider, MD  metroNIDAZOLE (FLAGYL) 500 MG tablet Take 1 tablet (500 mg total) by mouth 2 (two) times daily. 12/03/15   Shelly Bombard, MD  predniSONE (DELTASONE) 50 MG tablet Take 1 tablet (50 mg total) by mouth daily. 12/04/15 12/09/15  Sherlene Shams, MD  valACYclovir (VALTREX) 500 MG tablet Take 1 tablet (500 mg total) by mouth 2 (two) times daily. Take for 3 days prn. 11/30/15   Shelly Bombard, MD    Family History Family History  Problem Relation Age of Onset  . Adopted: Yes  . Bone cancer Mother   . Breast cancer Maternal Grandmother   . Diabetes    . Hypertension    . Stroke    .  Heart attack      Social History Social History  Substance Use Topics  . Smoking status: Former Research scientist (life sciences)  . Smokeless tobacco: Never Used  . Alcohol use 0.6 oz/week    1 Glasses of wine per week     Comment: rare     Allergies   Review of patient's allergies indicates no known allergies.   Review of Systems Review of Systems  All other systems reviewed and are negative.    Physical Exam Triage Vital Signs ED Triage Vitals  Enc Vitals Group     BP 12/04/15 1622 116/86     Pulse Rate 12/04/15 1622 110     Resp --      Temp 12/04/15 1622 98.2 F (36.8 C)     Temp Source 12/04/15 1622 Oral     SpO2 12/04/15 1622 98 %     Weight --      Height --        Pain Score 12/04/15 1624 9   Updated Vital Signs BP 116/86 (BP Location: Right Arm)   Pulse 110   Temp 98.2 F (36.8 C) (Oral)   SpO2 98%  Physical Exam  Constitutional: She is oriented to person, place, and time.  Alert, nicely groomed Laying down on exam table Looks ill but not toxic  HENT:  Head: Atraumatic.  Bilateral TMs are dull, red tinged Moderate nasal congestion Throat is somewhat red with prominent tonsils  Eyes:  Conjugate gaze, no eye redness/drainage  Neck: Neck supple.  Cardiovascular: Regular rhythm.   Heart rate 110s  Pulmonary/Chest:  Diffuse wheezing, symmetric breath sounds Diminished posteriorly Increased respiratory effort  Abdominal: She exhibits no distension.  Musculoskeletal: Normal range of motion.  No leg swelling  Neurological: She is alert and oriented to person, place, and time.  Skin: Skin is warm and dry.  No cyanosis  Nursing note and vitals reviewed.    UC Treatments / Results   Radiology Dg Chest 2 View  Result Date: 12/03/2015 CLINICAL DATA:  Sick for 2 days, short of breath and wheezing EXAM: CHEST  2 VIEW COMPARISON:  02/20/2013 FINDINGS: The heart size and mediastinal contours are within normal limits. Both lungs are clear. The visualized skeletal structures are unremarkable. IMPRESSION: No active cardiopulmonary disease. Electronically Signed   By: Donavan Foil M.D.   On: 12/03/2015 18:42    Procedures Procedures (including critical care time)  Medications Ordered in UC Medications  ipratropium-albuterol (DUONEB) 0.5-2.5 (3) MG/3ML nebulizer solution 3 mL (3 mLs Nebulization Given 12/04/15 1710)  methylPREDNISolone sodium succinate (SOLU-MEDROL) 125 mg/2 mL injection 125 mg (125 mg Intramuscular Given 12/04/15 1710)    Final Clinical Impressions(s) / UC Diagnoses   Final diagnoses:  Exacerbation of asthma, unspecified asthma severity, unspecified whether persistent  Acute pharyngitis, unspecified etiology    Duoneb breathing treatment was given today, and a throat culture was collected.  Rapid strep test was negative; culture result will be available in a few days.  Chest xray yesterday was negative for pneumonia.  Injection of solumedrol (a steroid) was given today for asthma exacerbation and prednisone prescription was sent to the [facility] on [address].  Check with infant's health care provider about breastfeeding while taking prednisone. Anticipate gradual improvement in cough/sore throat/well being over the next several days.  Recheck as needed.  New Prescriptions New Prescriptions   PREDNISONE (DELTASONE) 50 MG TABLET    Take 1 tablet (50 mg total) by mouth daily.  Sherlene Shams, MD 12/08/15 1201

## 2015-12-07 ENCOUNTER — Ambulatory Visit (INDEPENDENT_AMBULATORY_CARE_PROVIDER_SITE_OTHER): Payer: Medicaid Other | Admitting: Pulmonary Disease

## 2015-12-07 ENCOUNTER — Encounter: Payer: Self-pay | Admitting: Pulmonary Disease

## 2015-12-07 DIAGNOSIS — E049 Nontoxic goiter, unspecified: Secondary | ICD-10-CM

## 2015-12-07 DIAGNOSIS — J4531 Mild persistent asthma with (acute) exacerbation: Secondary | ICD-10-CM | POA: Diagnosis not present

## 2015-12-07 DIAGNOSIS — J45909 Unspecified asthma, uncomplicated: Secondary | ICD-10-CM | POA: Insufficient documentation

## 2015-12-07 DIAGNOSIS — E040 Nontoxic diffuse goiter: Secondary | ICD-10-CM | POA: Insufficient documentation

## 2015-12-07 LAB — CULTURE, GROUP A STREP (THRC)

## 2015-12-07 MED ORDER — PREDNISONE 10 MG PO TABS: ORAL_TABLET | ORAL | 0 refills | Status: DC

## 2015-12-07 MED ORDER — BECLOMETHASONE DIPROPIONATE 80 MCG/ACT IN AERS: 2.0000 | INHALATION_SPRAY | Freq: Two times a day (BID) | RESPIRATORY_TRACT | 6 refills | Status: DC

## 2015-12-07 MED ORDER — METHYLPREDNISOLONE ACETATE 80 MG/ML IJ SUSP
80.0000 mg | Freq: Once | INTRAMUSCULAR | Status: AC
  Administered 2015-12-07: 80 mg via INTRAMUSCULAR

## 2015-12-07 NOTE — Progress Notes (Addendum)
Subjective:     [name], female   DOB: 05-Mar-1975, 40 y.o.   MRN: [id number]  HPI  ~  December 07, 2015:  Initial Pulmonary Consult by SN>      82 y/o BF referred by DrPavelock at the [facility] for a pulmonary evaluation due to dyspnea>     Pt states that she has had ASTHMA since birth; she remembers being treated w/ Pred & Albuterol as a child, similar intermittent therapy as a teen, and Pred, Qvar, Proair as an adult; she notes asthma attacks w/ wheezing, cough, SOB esp after an upper resp infection, but also w/ exposure to allergens, & change in the weather;  She smokes MJ but not cigarettes; States that at age 40 she found out 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     She has gone to the ER/ UC on several occas- most recently 11/2-3/17 & 10/08/15> records reviewed-- no wheezing heard, CXR was clear, treated w/ Duoneb/ Depo/ Pred...     Med list includes Qvar80-2spBid & ProairHFA prn- she is not using the Qvar regularly... EXAM shows Afeb, VSS, O2sat=98% on RA;  HEENT- neg, mallampati2, palp thyroid;  Chest- clear w/o w/r/r;  Heart- RR w/o m/r/g heard;  Abd- soft, nontender, neg;  Ext- neg w/o c/c/e;  Neuro- intact...  CXR 12/03/15 at [facility]>  Norm heart size, clear lungs, NAD...  Spirometry here 12/07/15>  FVC=2.91 (101%), FEV1=1.92 (80%), %1sec=66%, mid-flows are reduced at 52% predicted.  Ambulatory oximetry on RA>  O2sat=97% at rest w/ pulse=85;  She ambulated 3 Laps (185'ea) w/ lowest O2sat=96% w/ pulse=114... 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 Etc... We plan ROV in 6wks.  ADDENDUM>>  Records received from DrSharma>> seen 10/2013 thru 12/2014 w/ AR & mid persistent Asthma on Qvar40-2spBid, ProairHFA prn but pt had compliance issues and never used the Qvar regularly...  Allergy Testing>  Pos for dust mites> cockroach> Dog dander> sm reaction to a few trees...  Spirometry>  FVC=2.89 (101%), FEV1=1.86 (76%), %1sec=64, mid-flows reduced at 41% predicted...    Past Medical History:  Diagnosis Date  . Allergy   . Asthma   . Headache   . 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 Pneumonia   . Prior miscarriage with pregnancy in first trimester, antepartum   . Vaginal Pap smear, abnormal    Left nephrolithiasis (31mm lower pole) seen on prev Abd sonar 05/2015  Anemia w/ Hg=11.9, MCV=84  Hx low serum Albumin ~2.6-2.7 range  Palp thyroid & left thyroid nodule - eval by DrWyatt, [facility] in 2013   Past Surgical History:  Procedure Laterality Date  . CERVICAL CERCLAGE N/A 02/26/2015   Procedure: CERCLAGE CERVICAL;  Surgeon: Frederico Hamman,  MD;  Location: [facility];  Service: Gynecology;  Laterality: N/A;  . DILATION AND CURETTAGE OF UTERUS    . DILATION AND EVACUATION N/A 05/22/2012   Procedure: DILATATION AND EVACUATION;  Surgeon: Frederico Hamman, MD;  Location: [facility];  Service: Gynecology;  Laterality: N/A;  . FOOT SURGERY      Outpatient Encounter Prescriptions as of 12/07/2015  Medication Sig  . albuterol (PROVENTIL HFA;VENTOLIN HFA) 108 (90 Base) MCG/ACT inhaler Inhale 1-2 puffs into the lungs every 6 (six) hours as needed for wheezing or shortness of breath.  Marland Kitchen albuterol (PROVENTIL) (2.5 MG/3ML) 0.083% nebulizer  solution Take 3 mLs (2.5 mg total) by nebulization every 6 (six) hours as needed for wheezing or shortness of breath.  . metroNIDAZOLE (FLAGYL) 500 MG tablet Take 1 tablet (500 mg total) by mouth 2 (two) times daily.  . valACYclovir (VALTREX) 500 MG tablet Take 1 tablet (500 mg total) by mouth 2 (two) times daily. Take for 3 days prn.  . [DISCONTINUED] beclomethasone (QVAR) 40 MCG/ACT inhaler Inhale 1 puff into the lungs 2 (two) times daily. Reported on 06/10/2015  . [DISCONTINUED] albuterol (PROVENTIL HFA;VENTOLIN HFA) 108 (90 Base) MCG/ACT inhaler Inhale 2 puffs into the lungs every 4 (four) hours as needed for wheezing or shortness of breath. Reported on 06/10/2015   No facility-administered encounter medications on file as of 12/07/2015.     No Known Allergies  She had allergy eval from DrSharma & was on shots in 2016   Immunization History  Administered Date(s) Administered  . Tdap 06/13/2015    Family History  Problem Relation Age of Onset  . Adopted: Yes  . Bone cancer Mother   . Breast cancer Maternal Grandmother   . Diabetes    . Hypertension    . Stroke    . Heart attack      Social History   Social History  . Marital status: Single    Spouse name: N/A  . Number of children: N/A  . Years of education: N/A   Occupational History  . Not on file.   Social History Main Topics  . Smoking status: Former Smoker    Packs/day: 1.00    Years: 20.00    Types: Cigarettes    Quit date: 01/05/2014  . Smokeless tobacco: Never Used  . Alcohol use 0.6 oz/week    1 Glasses of wine per week     Comment: rare  . Drug use:     Types: Marijuana     Comment: occasional use  . Sexual activity: No   Other Topics Concern  . Not on file   Social History Narrative  . No narrative on file    Current Medications, Allergies, Past Medical History, Past Surgical History, Family History, and Social History were reviewed in [employer] record.   Review of  Systems             All symptoms NEG except where BOLDED >>  Constitutional:  F/C/S, fatigue, anorexia, unexpected weight change. HEENT:  HA, visual changes, hearing loss, earache, nasal symptoms, sore throat, mouth sores, hoarseness. Resp:  cough, sputum, hemoptysis; SOB, tightness, wheezing. Cardio:  CP, palpit, DOE, orthopnea, edema. GI:  N/V/D/C, blood in stool; reflux, abd pain, distention, gas. GU:  dysuria, freq, urgency, hematuria, flank pain, voiding difficulty. MS:  joint pain, swelling, tenderness, decr ROM; neck pain, back pain, etc. Neuro:  HA, tremors, seizures, dizziness, syncope, weakness, numbness, gait abn. Skin:  suspicious lesions or skin rash.  Heme:  adenopathy, bruising, bleeding. Psyche:  confusion, agitation, sleep disturbance, hallucinations, anxiety, depression suicidal.   Objective:   Physical Exam       Vital Signs:  Reviewed...   General:  WD, WN, 40 y/o BF in NAD; alert & oriented; pleasant & cooperative... HEENT:  [place]/AT; Conjunctiva- pink, Sclera- nonicteric, EOM-wnl, PERRLA; EACs-clear, TMs-wnl; NOSE-clear; THROAT-clear & wnl.  Neck:  Supple w/ full ROM; no JVD; normal carotid impulses w/o bruits; palp thyroid; no lymphadenopathy.  Chest:  Clear to P & A; without wheezes, rales, or rhonchi heard. Heart:  Regular Rhythm; norm S1 & S2 without murmurs, rubs, or gallops detected. Abdomen:  Soft & nontender- no guarding or rebound; normal bowel sounds; no organomegaly or masses palpated. Ext:  Normal ROM; without deformities or arthritic changes; no varicose veins, venous insuffic, or edema;  Pulses intact w/o bruits. Neuro:  CNs II-XII intact; motor testing normal; sensory testing normal; gait normal & balance OK. Derm:  No lesions noted; no rash etc. Lymph:  No cervical, supraclavicular, axillary, or inguinal adenopathy palpated.   Assessment:       IMP >>     ASTHMA - mild persistent>  We discussed a tapering course of Pred + QVAR80-2spBid & ProairHFA  as needed...    Underlying allergies>  Prev eval by DrSharma, she was on shots in 2016, we will try to get records...    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 Etc... We plan ROV in 6wks.    Plan:     Patient's Medications  New Prescriptions   BECLOMETHASONE (QVAR) 80 MCG/ACT INHALER    Inhale 2 puffs into the lungs 2 (two) times daily.   PREDNISONE (DELTASONE) 10 MG TABLET    Take as directed by Dr. Lenna Gilford  Previous Medications   ALBUTEROL (PROVENTIL HFA;VENTOLIN HFA) 108 (90 BASE) MCG/ACT INHALER    Inhale 1-2 puffs into the lungs every 6 (six) hours as needed for wheezing or shortness of breath.   ALBUTEROL (PROVENTIL) (2.5 MG/3ML) 0.083% NEBULIZER SOLUTION    Take 3 mLs (2.5 mg total) by nebulization every 6 (six) hours as needed for wheezing or shortness of breath.   METRONIDAZOLE (FLAGYL) 500 MG TABLET    Take 1 tablet (500 mg total) by mouth 2 (two) times daily.   VALACYCLOVIR (VALTREX) 500 MG TABLET    Take 1 tablet (500 mg total) by mouth 2 (two) times daily. Take for 3 days prn.  Modified Medications   No medications on file  Discontinued Medications   ALBUTEROL (PROVENTIL HFA;VENTOLIN HFA) 108 (90 BASE) MCG/ACT INHALER    Inhale 2 puffs into the lungs every 4 (four) hours as needed for wheezing or shortness of breath. Reported on 06/10/2015   BECLOMETHASONE (QVAR) 40 MCG/ACT INHALER    Inhale  1 puff into the lungs 2 (two) times daily. Reported on  06/10/2015   PREDNISONE (DELTASONE) 50 MG TABLET    Take 1 tablet (50 mg total) by mouth daily.

## 2015-12-07 NOTE — Patient Instructions (Signed)
Myley-- it was nice meeting you today...  We reviewed your medical history, exam & recent ER data including the CXR... We also checked a spirometry breathing test and an ambulatory oximetry test...  We will try to get your records from [place] to review, and we will recheck any studies that need analysis on return...  We gave you a Depo shot today... We wrote for PREDNISONE 10mg  tabs to take as follows>>    Start w/ one tab twice daily for 1 week...    Then decrease to 1 tab each AM for 1 week...    Then decrease to 1/2 tab daily each AM for 1 week...    Then decrease to 1/2 tab every other day til return visit in about 6wks...  We increased the QVAR inhaler to QVAR80- 2 sprays twice daily...  Continue the PROAIR inhaler 1-2 sprays every 6H as needed for wheezing...  Remember to use the OTC MUCINEX 600mg  tabs> 1-2tabs twice daily w/ lots of water for the thick mucus...  Call for any questions...  Let's plan a follow up visit in 6wks, sooner if needed for problems.Marland KitchenMarland Kitchen

## 2015-12-09 ENCOUNTER — Institutional Professional Consult (permissible substitution): Payer: Medicaid Other | Admitting: Pulmonary Disease

## 2016-01-18 ENCOUNTER — Encounter: Payer: Self-pay | Admitting: Pulmonary Disease

## 2016-01-18 ENCOUNTER — Ambulatory Visit (INDEPENDENT_AMBULATORY_CARE_PROVIDER_SITE_OTHER): Payer: Medicaid Other | Admitting: Pulmonary Disease

## 2016-01-18 VITALS — BP 120/78 | HR 106 | Temp 98.6°F | Ht 62.0 in | Wt 190.2 lb

## 2016-01-18 DIAGNOSIS — Z9119 Patient's noncompliance with other medical treatment and regimen: Secondary | ICD-10-CM

## 2016-01-18 DIAGNOSIS — F17210 Nicotine dependence, cigarettes, uncomplicated: Secondary | ICD-10-CM

## 2016-01-18 DIAGNOSIS — J4530 Mild persistent asthma, uncomplicated: Secondary | ICD-10-CM

## 2016-01-18 DIAGNOSIS — Z91199 Patient's noncompliance with other medical treatment and regimen due to unspecified reason: Secondary | ICD-10-CM

## 2016-01-18 DIAGNOSIS — J302 Other seasonal allergic rhinitis: Secondary | ICD-10-CM | POA: Diagnosis not present

## 2016-01-18 NOTE — Patient Instructions (Addendum)
Today we updated your med list in our EPIC system...    Continue your current medications the same...  We increased the QVAR inhaler to QVAR80- 2 sprays twice daily...  Continue the PROAIR inhaler 1-2 sprays every 6H as needed for wheezing...  Remember to use the OTC MUCINEX 600mg  tabs> 1-2tabs twice daily w/ lots of water for the thick mucus...  Call for any questions...  Let's plan a follow up visit in 52mo, sooner if needed for acute problems.Marland KitchenMarland Kitchen

## 2016-01-18 NOTE — Progress Notes (Signed)
Subjective:     [name], female   DOB: Nov 27, 1975, 40 y.o.   MRN: [id number]  HPI  ~  December 07, 2015:  Initial Pulmonary Consult by SN>      46 y/o BF referred by DrPavelock at the [facility] for a pulmonary evaluation due to dyspnea>     Pt states that she has had ASTHMA since birth; she remembers being treated w/ Pred & Albuterol as a child, similar intermittent therapy as a teen, and Pred, Qvar, Proair as an adult; she notes asthma attacks w/ wheezing, cough, SOB esp after an upper resp infection, but also w/ exposure to allergens, & change in the weather;  She smokes MJ but not cigarettes; States that at age 59 she found out she was allergic to Christmas trees;  She had prev allergy eval by DrSharma ~104yrs ago by her hx & was on allergy shots regularly until her last preg in 2016 when shots were held & never restarted (we do not have records from [place] & have called for these)...     She has gone to the ER/ UC on several occas- most recently 11/2-3/17 & 10/08/15> records reviewed-- no wheezing heard, CXR was clear, treated w/ Duoneb/ Depo/ Pred...     Med list includes Qvar80-2spBid & ProairHFA prn- she is not using the Qvar regularly... EXAM shows Afeb, VSS, O2sat=98% on RA;  HEENT- neg, mallampati2, palp thyroid;  Chest- clear w/o w/r/r;  Heart- RR w/o m/r/g heard;  Abd- soft, nontender, neg;  Ext- neg w/o c/c/e;  Neuro- intact...  CXR 12/03/15 at [facility]>  Norm heart size, clear lungs, NAD...  Spirometry here 12/07/15>  FVC=2.91 (101%), FEV1=1.92 (80%), %1sec=66%, mid-flows are reduced at 52% predicted.  Ambulatory oximetry on RA>  O2sat=97% at rest w/ pulse=85;  She ambulated 3 Laps (185'ea) w/ lowest O2sat=96% w/ pulse=114... 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 Etc... We plan ROV in 6wks.  ADDENDUM>>  Records received from DrSharma>> seen 10/2013 thru 12/2014 w/ AR & mild persistent Asthma on Qvar40-2spBid, ProairHFA prn but pt had compliance issues and never used the Qvar regularly...  Allergy Testing>  Pos for dust mites> cockroach> Dog dander> sm reaction to a few trees...  Spirometry>  FVC=2.89 (101%), FEV1=1.86 (76%), %1sec=64, mid-flows reduced at 41% predicted...  ~  January 18, 2016:  6wk ROV w/ SN>  Catherine Osborne is improved on the QVar80-2spBid regularly, Pred taper- down to 10mg /d now, Albut vs HFA prn;  She has not had an asthma flair since her last visit;  She also tells me that she was nott 100% honest- she was smoking cigarettes & little cigars (since age 18, up to 1/2 pack per day) and she notes that this definitely affects her asthma;  She also didn't take the Prednisone as directed=> off now...     ASTHMA & mild COPD- GOLD Stage1> on QVar80-2spBid, ProairHFA prn, Mucinex prn; main triggers are URIs, exposure to allergens, change in weather.Marland KitchenMarland KitchenMarland Kitchen  Cigarette & other smoking> advised to quit & stay quit.    Allergies> eval per DrSharma, off immunotherapy at present. EXAM shows Afeb, VSS, O2sat=98% on RA;  HEENT- neg, mallampati2, palp thyroid;  Chest- clear w/o w/r/r;  Heart- RR w/o m/r/g heard;  Abd- soft, nontender, neg;  Ext- neg w/o c/c/e;  Neuro- intact... IMP/PLAN>>  Catherine Osborne is rec to quit all smoking & stay quit, avoid infections, and comply w/all recommended vaccinations;  Use the QVar80-2spBid, ProairHFA as needed, Mucinex600 1-2Bid w/ fluids...    Past Medical History:  Diagnosis Date  . Allergy   . Asthma   .  Headache   . 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 Pneumonia   . Prior miscarriage with pregnancy in first trimester, antepartum   . Vaginal Pap smear, abnormal    Left nephrolithiasis (26mm lower pole) seen on prev Abd sonar 05/2015  Anemia w/ Hg=11.9, MCV=84  Hx low serum Albumin ~2.6-2.7 range  Palp thyroid & left thyroid nodule - eval by DrWyatt, [facility] in 2013   Past Surgical History:  Procedure Laterality Date  . CERVICAL CERCLAGE N/A 02/26/2015   Procedure: CERCLAGE CERVICAL;  Surgeon: Frederico Hamman, MD;  Location: [facility];  Service: Gynecology;  Laterality: N/A;  . DILATION AND CURETTAGE OF UTERUS    . DILATION AND EVACUATION N/A 05/22/2012   Procedure: DILATATION AND EVACUATION;  Surgeon: Frederico Hamman, MD;  Location: [facility];  Service: Gynecology;  Laterality: N/A;  . FOOT SURGERY      Outpatient Encounter Prescriptions as of 01/18/2016  Medication Sig  . albuterol (PROVENTIL HFA;VENTOLIN HFA) 108 (90 Base) MCG/ACT inhaler Inhale 1-2 puffs into the lungs every 6 (six) hours as needed for wheezing or shortness of breath.  Marland Kitchen albuterol (PROVENTIL) (2.5 MG/3ML) 0.083% nebulizer solution Take 3 mLs (2.5 mg total) by nebulization every 6 (six) hours as needed for wheezing or shortness of breath.  . beclomethasone (QVAR) 80 MCG/ACT inhaler Inhale 2 puffs into the lungs 2 (two) times daily.  . predniSONE (DELTASONE) 10 MG tablet Take as directed by Dr. Lenna Gilford  . valACYclovir (VALTREX) 500 MG tablet Take 1 tablet (500 mg total) by mouth 2 (two) times daily. Take for 3 days prn.  . [DISCONTINUED] metroNIDAZOLE (FLAGYL) 500 MG tablet Take 1 tablet (500 mg total) by mouth 2 (two) times daily. (Patient not taking: Reported on 01/18/2016)   No facility-administered encounter medications on file as of 01/18/2016.     No Known Allergies  She had allergy eval from DrSharma & was on shots in 2016   Immunization History  Administered Date(s) Administered  . Tdap 06/13/2015    Family  History  Problem Relation Age of Onset  . Adopted: Yes  . Bone cancer Mother   . Breast cancer Maternal Grandmother   . Diabetes    . Hypertension    . Stroke    . Heart attack      Social History   Social History  . Marital status: Single    Spouse name: N/A  . Number of children: N/A  . Years of education: N/A   Occupational History  . Not on file.   Social History Main Topics  . Smoking status: Former Smoker    Packs/day: 1.00    Years: 20.00    Types: Cigarettes    Quit date: 01/05/2014  . Smokeless tobacco: Never Used  . Alcohol use 0.6 oz/week    1 Glasses of wine per week  Comment: rare  . Drug use:     Types: Marijuana     Comment: occasional use  . Sexual activity: No   Other Topics Concern  . Not on file   Social History Narrative  . No narrative on file    Current Medications, Allergies, Past Medical History, Past Surgical History, Family History, and Social History were reviewed in [employer] record.   Review of Systems             All symptoms NEG except where BOLDED >>  Constitutional:  F/C/S, fatigue, anorexia, unexpected weight change. HEENT:  HA, visual changes, hearing loss, earache, nasal symptoms, sore throat, mouth sores, hoarseness. Resp:  cough, sputum, hemoptysis; SOB, tightness, wheezing. Cardio:  CP, palpit, DOE, orthopnea, edema. GI:  N/V/D/C, blood in stool; reflux, abd pain, distention, gas. GU:  dysuria, freq, urgency, hematuria, flank pain, voiding difficulty. MS:  joint pain, swelling, tenderness, decr ROM; neck pain, back pain, etc. Neuro:  HA, tremors, seizures, dizziness, syncope, weakness, numbness, gait abn. Skin:  suspicious lesions or skin rash. Heme:  adenopathy, bruising, bleeding. Psyche:  confusion, agitation, sleep disturbance, hallucinations, anxiety, depression suicidal.   Objective:   Physical Exam       Vital Signs:  Reviewed...   General:  WD, WN, 40 y/o BF in NAD; alert &  oriented; pleasant & cooperative... HEENT:  [place]/AT; Conjunctiva- pink, Sclera- nonicteric, EOM-wnl, PERRLA; EACs-clear, TMs-wnl; NOSE-clear; THROAT-clear & wnl.  Neck:  Supple w/ full ROM; no JVD; normal carotid impulses w/o bruits; palp thyroid; no lymphadenopathy.  Chest:  Clear to P & A; without wheezes, rales, or rhonchi heard. Heart:  Regular Rhythm; norm S1 & S2 without murmurs, rubs, or gallops detected. Abdomen:  Soft & nontender- no guarding or rebound; normal bowel sounds; no organomegaly or masses palpated. Ext:  Normal ROM; without deformities or arthritic changes; no varicose veins, venous insuffic, or edema;  Pulses intact w/o bruits. Neuro:  CNs II-XII intact; motor testing normal; sensory testing normal; gait normal & balance OK. Derm:  No lesions noted; no rash etc. Lymph:  No cervical, supraclavicular, axillary, or inguinal adenopathy palpated.   Assessment:       IMP >>     ASTHMA - mild persistent>  We discussed a tapering course of Pred + QVAR80-2spBid & ProairHFA as needed...    Ex-smoker> she had been smoking ~1/2 ppd & was not honest w/ [place] when 1st seen...    Underlying allergies>  Prev eval by DrSharma, she was on shots in 2016, we will try to get records...    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 Etc... We plan ROV in 6wks. 01/18/16>   Avya is rec to quit all smoking &  stay quit, avoid infections, and comply w/all recommended vaccinations;  Use the QVar80-2spBid, ProairHFA as needed, Mucinex600 1-2Bid w/  fluids..    Plan:     Patient's Medications  New Prescriptions   No medications on file  Previous Medications   ALBUTEROL (PROVENTIL HFA;VENTOLIN HFA) 108 (90 BASE) MCG/ACT INHALER    Inhale 1-2 puffs into the lungs every 6 (six) hours as needed for wheezing or shortness of breath.   ALBUTEROL (PROVENTIL) (2.5 MG/3ML) 0.083% NEBULIZER SOLUTION    Take 3 mLs (2.5 mg total) by nebulization every 6 (six) hours as needed for wheezing or shortness of breath.   BECLOMETHASONE (QVAR) 80 MCG/ACT INHALER    Inhale 2 puffs into the lungs 2 (two) times daily.   PREDNISONE (DELTASONE) 10 MG TABLET    Take as directed by Dr. Lenna Gilford   VALACYCLOVIR (VALTREX) 500 MG TABLET    Take 1 tablet (500 mg total) by mouth 2 (two) times daily. Take for 3 days prn.  Modified Medications   No medications on file  Discontinued Medications   METRONIDAZOLE (FLAGYL) 500 MG TABLET    Take 1 tablet (500 mg total) by mouth 2 (two) times daily.

## 2016-03-18 ENCOUNTER — Encounter: Payer: Self-pay | Admitting: Urology

## 2016-04-27 ENCOUNTER — Encounter: Payer: Self-pay | Admitting: Physician Assistant

## 2016-05-10 ENCOUNTER — Ambulatory Visit: Payer: Medicaid Other | Admitting: Physician Assistant

## 2016-05-24 ENCOUNTER — Ambulatory Visit: Payer: Medicaid Other | Admitting: Physician Assistant

## 2016-05-31 ENCOUNTER — Encounter (HOSPITAL_COMMUNITY): Payer: Self-pay | Admitting: Emergency Medicine

## 2016-05-31 ENCOUNTER — Emergency Department (HOSPITAL_COMMUNITY): Payer: Medicaid Other

## 2016-05-31 ENCOUNTER — Emergency Department (HOSPITAL_COMMUNITY)
Admission: EM | Admit: 2016-05-31 | Discharge: 2016-05-31 | Disposition: A | Discharge status: Home / Self Care | Payer: Medicaid Other | Attending: Emergency Medicine | Admitting: Emergency Medicine

## 2016-05-31 DIAGNOSIS — Y9389 Activity, other specified: Secondary | ICD-10-CM | POA: Insufficient documentation

## 2016-05-31 DIAGNOSIS — Z87891 Personal history of nicotine dependence: Secondary | ICD-10-CM | POA: Insufficient documentation

## 2016-05-31 DIAGNOSIS — J45909 Unspecified asthma, uncomplicated: Secondary | ICD-10-CM | POA: Insufficient documentation

## 2016-05-31 DIAGNOSIS — Y92410 Unspecified street and highway as the place of occurrence of the external cause: Secondary | ICD-10-CM | POA: Diagnosis not present

## 2016-05-31 DIAGNOSIS — Y999 Unspecified external cause status: Secondary | ICD-10-CM | POA: Diagnosis not present

## 2016-05-31 DIAGNOSIS — S299XXA Unspecified injury of thorax, initial encounter: Secondary | ICD-10-CM | POA: Diagnosis not present

## 2016-05-31 DIAGNOSIS — S199XXA Unspecified injury of neck, initial encounter: Secondary | ICD-10-CM | POA: Insufficient documentation

## 2016-05-31 DIAGNOSIS — M7918 Myalgia, other site: Secondary | ICD-10-CM

## 2016-05-31 MED ORDER — METHOCARBAMOL 500 MG PO TABS: 500.0000 mg | ORAL_TABLET | Freq: Two times a day (BID) | ORAL | 0 refills | Status: DC
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
## 2016-05-31 NOTE — ED Notes (Signed)
Patient transported to X-ray 
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
## 2016-06-14 ENCOUNTER — Ambulatory Visit (INDEPENDENT_AMBULATORY_CARE_PROVIDER_SITE_OTHER): Payer: Medicaid Other | Admitting: Physician Assistant

## 2016-06-14 ENCOUNTER — Encounter: Payer: Self-pay | Admitting: Physician Assistant

## 2016-06-14 VITALS — BP 98/60 | Ht 62.0 in | Wt 196.0 lb

## 2016-06-14 DIAGNOSIS — K602 Anal fissure, unspecified: Secondary | ICD-10-CM

## 2016-06-14 DIAGNOSIS — K921 Melena: Secondary | ICD-10-CM

## 2016-06-14 DIAGNOSIS — R197 Diarrhea, unspecified: Secondary | ICD-10-CM

## 2016-06-14 MED ORDER — NA SULFATE-K SULFATE-MG SULF 17.5-3.13-1.6 GM/177ML PO SOLN: 1.0000 | ORAL | 0 refills | Status: DC

## 2016-06-14 NOTE — Patient Instructions (Signed)

## 2016-06-14 NOTE — Progress Notes (Signed)
Chief Complaint: Anal Fissure, Rectal bleeding, Loose stool  HPI:  Catherine Osborne is a 41 year old female with a past medical history as listed below,  who presents to clinic today as a referral from Dr. Alphonzo Grieve for complaint of an anal fissure. Referral was placed 04/27/16.    Referring physicians notes from that time show patient complained of a burning "down there" she apparently had a rectal exam which showed a fissure, she was prescribed Anusol suppositories and Colace.   Today, it should be noted that the patient is a very poor historian and hard to follow, later in the patient's visit she tells my nurse that she is actually high at the time of today's appointment. Patient describes a confusing history but tells me that her body started "falling apart" a year ago when she had her last child. Apparently, she has been having some loose stool and fecal incontinence causing her to wear a "diaper" on a daily basis. Patient does describe some vague stool studies where she had to put her "poop who in a jar", but is unable to tell me results or what was tested for. She does tell me she was diagnosed with lactose intolerance but is not sure how they did this and "I am not going to stop eating my cheese". Patient has continued with some inconsistency in her stool noting that she will have "different colors like my baby" and may have a bowel movement every other day. She also tells me that she has bright red blood accompanying every other bowel movement. Patient notes that she never started the stool softeners or suppositories as prescribed by her PCP as these were too expensive, but has not had any further rectal pain over the past couple of months. Her main concern today is of bleeding and loose stool with incontinence.   Patient denies fever, chills, weight loss, fatigue, anorexia, nausea, vomiting, heartburn, reflux or abdominal pain.  Past Medical History:  Diagnosis Date  . Allergy   . Asthma   .  Headache   . 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 Pneumonia   . Prior miscarriage with pregnancy in first trimester, antepartum   . Vaginal Pap smear, abnormal     Past Surgical History:  Procedure Laterality Date  . CERVICAL CERCLAGE N/A 02/26/2015   Procedure: CERCLAGE CERVICAL;  Surgeon: Frederico Hamman, MD;  Location: [facility];  Service: Gynecology;  Laterality: N/A;  . DILATION AND CURETTAGE OF UTERUS    . DILATION AND EVACUATION N/A 05/22/2012   Procedure: DILATATION AND EVACUATION;  Surgeon: Frederico Hamman, MD;  Location: [facility];  Service: Gynecology;  Laterality: N/A;  . FOOT SURGERY      Current Outpatient Prescriptions  Medication Sig Dispense Refill  . albuterol (PROVENTIL HFA;VENTOLIN HFA) 108 (90 Base) MCG/ACT inhaler Inhale 1-2 puffs into the lungs every 6 (six) hours as needed for wheezing or shortness of breath. 1 Inhaler 0  . albuterol (PROVENTIL) (2.5 MG/3ML) 0.083% nebulizer solution Take 3 mLs (2.5 mg total) by nebulization every 6 (six) hours as needed for wheezing or shortness of breath. 75 mL 12  . beclomethasone (QVAR) 80 MCG/ACT inhaler Inhale 2 puffs into the lungs 2 (two) times daily. 1 Inhaler 6  . methocarbamol (ROBAXIN) 500 MG tablet Take 1 tablet (500 mg total) by mouth 2 (two) times daily. 20 tablet 0  . predniSONE (DELTASONE) 10 MG tablet Take as directed by Dr. Lenna Gilford 40 tablet 0  . valACYclovir (VALTREX) 500 MG tablet  Take 1 tablet (500 mg total) by mouth 2 (two) times daily. Take for 3 days prn. 30 tablet prn   No current facility-administered medications for this visit.     Allergies as of 06/14/2016  . (No Known Allergies)    Family History  Problem Relation Age of Onset  . Adopted: Yes  . Bone cancer Mother   . Breast cancer Maternal Grandmother   . Diabetes Unknown   . Hypertension Unknown   . Stroke Unknown   . Heart attack Unknown     Social History   Social History  . Marital status: Single    Spouse name: N/A  . Number of  children: N/A  . Years of education: N/A   Occupational History  . Not on file.   Social History Main Topics  . Smoking status: Former Smoker    Packs/day: 1.00    Years: 20.00    Types: Cigarettes    Quit date: 01/05/2014  . Smokeless tobacco: Never Used  . Alcohol use 0.6 oz/week    1 Glasses of wine per week     Comment: rare  . Drug use: Yes    Types: Marijuana     Comment: occasional use  . Sexual activity: No   Other Topics Concern  . Not on file   Social History Narrative  . No narrative on file    Review of Systems:    Constitutional: No weight loss, fever or chills Skin: No rash Cardiovascular: No chest pain Respiratory: No SOB Gastrointestinal: See HPI and otherwise negative Genitourinary: No dysuria Neurological: No headache, dizziness or syncope Musculoskeletal: No new muscle or joint pain Hematologic: No bruising Psychiatric: No history of depression or anxiety   Physical Exam:  Vital signs: BP 98/60   Ht 5\' 2"  (1.575 m)   Wt 196 lb (88.9 kg)   BMI 35.85 kg/m    Constitutional:   Pleasant African American female appears to be in NAD, Well developed, Well nourished, alert and cooperative Head:  Normocephalic and atraumatic. Eyes:   PEERL, EOMI. No icterus. Conjunctiva pink.Pupils Dilated Ears:  Normal auditory acuity. Neck:  Supple Throat: Oral cavity and pharynx without inflammation, swelling or lesion.  Respiratory: Respirations even and unlabored. Lungs clear to auscultation bilaterally.   No wheezes, crackles, or rhonchi.  Cardiovascular: Normal S1, S2. No MRG. Regular rate and rhythm. No peripheral edema, cyanosis or pallor.  Gastrointestinal:  Soft, nondistended, nontender. No rebound or guarding. Normal bowel sounds. No appreciable masses or hepatomegaly. Rectal:  External exam: no fissure, hemorrhoids or discharge; Patient declines internal exam today Msk:  Symmetrical without gross deformities. Without edema, no deformity or joint  abnormality.  Neurologic:  Alert and  oriented x4;  grossly normal neurologically.  Skin:   Dry and intact without significant lesions or rashes. Psychiatric: Poor historian  No recent labs or imaging.  Assessment: 1. Anal Fissure: Diagnosed in March, no signs today, no further rectal pain 2. Hematochezia: Every other bowel movement per the patient, no external signs of hemorrhoids or fissures, no rectal pain; question internal hemorrhoids versus other 3. Loose stool: Per patient this has been occurring off and on over the past year with some fecal incontinence, causing her to wear a pad/diapers, apparently stool studies have been negative, though we do not know what these were, suggestion of lactose intolerance? Patient unwilling to follow a dairy free diet.  Plan: 1. Scheduled patient for a colonoscopy with Dr. Silverio Decamp in the [facility]. Discussed risks, benefits, limitations  and alternatives and the patient agrees to proceed. 2. We will request recent stool studies from patient's PCP. 3. Patient to return to clinic per recommendations from Dr. Silverio Decamp after time of procedure.  Ellouise Newer, PA-C [facility] 06/14/2016, 10:16 AM  CC: Dr. Alphonzo Grieve

## 2016-06-30 NOTE — Progress Notes (Signed)
Reviewed and agree with documentation and assessment and plan. K. Veena Adysen Raphael , MD   

## 2016-07-01 ENCOUNTER — Ambulatory Visit (AMBULATORY_SURGERY_CENTER): Payer: Medicaid Other | Admitting: Gastroenterology

## 2016-07-01 ENCOUNTER — Encounter: Payer: Self-pay | Admitting: Gastroenterology

## 2016-07-01 VITALS — BP 133/82 | HR 66 | Temp 98.4°F | Resp 19 | Ht 62.0 in | Wt 196.0 lb

## 2016-07-01 DIAGNOSIS — D123 Benign neoplasm of transverse colon: Secondary | ICD-10-CM

## 2016-07-01 DIAGNOSIS — K921 Melena: Secondary | ICD-10-CM | POA: Diagnosis not present

## 2016-07-01 DIAGNOSIS — K6389 Other specified diseases of intestine: Secondary | ICD-10-CM | POA: Diagnosis not present

## 2016-07-01 DIAGNOSIS — D128 Benign neoplasm of rectum: Secondary | ICD-10-CM

## 2016-07-01 DIAGNOSIS — D129 Benign neoplasm of anus and anal canal: Secondary | ICD-10-CM

## 2016-07-01 DIAGNOSIS — K621 Rectal polyp: Secondary | ICD-10-CM

## 2016-07-01 MED ORDER — SODIUM CHLORIDE 0.9 % IV SOLN: 500.0000 mL | INTRAVENOUS | Status: DC

## 2016-07-01 NOTE — Patient Instructions (Signed)
Handout given on polyps  YOU HAD AN ENDOSCOPIC PROCEDURE TODAY: Refer to the procedure report and other information in the discharge instructions given to you for any specific questions about what was found during the examination. If this information does not answer your questions, please call [place] office at [phone] to clarify.   YOU SHOULD EXPECT: Some feelings of bloating in the abdomen. Passage of more gas than usual. Walking can help get rid of the air that was put into your GI tract during the procedure and reduce the bloating. If you had a lower endoscopy (such as a colonoscopy or flexible sigmoidoscopy) you may notice spotting of blood in your stool or on the toilet paper. Some abdominal soreness may be present for a day or two, also.  DIET: Your first meal following the procedure should be a light meal and then it is ok to progress to your normal diet. A half-sandwich or bowl of soup is an example of a good first meal. Heavy or fried foods are harder to digest and may make you feel nauseous or bloated. Drink plenty of fluids but you should avoid alcoholic beverages for 24 hours. If you had a esophageal dilation, please see attached instructions for diet.    ACTIVITY: Your care partner should take you home directly after the procedure. You should plan to take it easy, moving slowly for the rest of the day. You can resume normal activity the day after the procedure however YOU SHOULD NOT DRIVE, use power tools, machinery or perform tasks that involve climbing or major physical exertion for 24 hours (because of the sedation medicines used during the test).   SYMPTOMS TO REPORT IMMEDIATELY: A gastroenterologist can be reached at any hour. Please call [phone]  for any of the following symptoms:  Following lower endoscopy (colonoscopy, flexible sigmoidoscopy) Excessive amounts of blood in the stool  Significant tenderness, worsening of abdominal pains  Swelling of the abdomen that is  new, acute  Fever of 100 or higher    FOLLOW UP:  If any biopsies were taken you will be contacted by phone or by letter within the next 1-3 weeks. Call [phone]  if you have not heard about the biopsies in 3 weeks.  Please also call with any specific questions about appointments or follow up tests.  

## 2016-07-01 NOTE — Progress Notes (Signed)
Pt's states no medical or surgical changes since previsit or office visit.  Patient did smoke weed a half hour ago.  Dr. Silverio Decamp aware and CRNA. They will do procedure.

## 2016-07-01 NOTE — Op Note (Signed)
[place] Patient Name: Catherine Osborne Procedure Date: 07/01/2016 11:21 AM MRN: [id number] Endoscopist: Mauri Pole , MD Age: 41 Referring MD:  Date of Birth: [birth year] Gender: Female Account #: [id number] Procedure:                Colonoscopy Indications:              Evaluation of unexplained GI bleeding Medicines:                Monitored Anesthesia Care Procedure:                Pre-Anesthesia Assessment:                           - Prior to the procedure, a History and Physical                            was performed, and patient medications and                            allergies were reviewed. The patient's tolerance of                            previous anesthesia was also reviewed. The risks                            and benefits of the procedure and the sedation                            options and risks were discussed with the patient.                            All questions were answered, and informed consent                            was obtained. Prior Anticoagulants: The patient has                            taken no previous anticoagulant or antiplatelet                            agents. ASA Grade Assessment: II - A patient with                            mild systemic disease. After reviewing the risks                            and benefits, the patient was deemed in                            satisfactory condition to undergo the procedure.                           After obtaining informed consent, the colonoscope  was passed under direct vision. Throughout the                            procedure, the patient's blood pressure, pulse, and                            oxygen saturations were monitored continuously. The                            Colonoscope was introduced through the anus and                            advanced to the the terminal ileum, with                            identification of the  appendiceal orifice and IC                            valve. The colonoscopy was performed without                            difficulty. The patient tolerated the procedure                            well. The quality of the bowel preparation was                            adequate to identify polyps 6 mm and larger in                            size. The terminal ileum, ileocecal valve,                            appendiceal orifice, and rectum were photographed. Scope In: 11:28:50 AM Scope Out: 11:39:28 AM Scope Withdrawal Time: 0 hours 8 minutes 8 seconds  Total Procedure Duration: 0 hours 10 minutes 38 seconds  Findings:                 The perianal and digital rectal examinations were                            normal.                           Two sessile polyps were found in the rectum and                            transverse colon. The polyps were 4 to 6 mm in                            size. These polyps were removed with a cold snare.                            Resection and retrieval were complete.  Non-bleeding internal hemorrhoids were found during                            retroflexion. The hemorrhoids were small. Complications:            No immediate complications. Estimated Blood Loss:     Estimated blood loss was minimal. Impression:               - Two 4 to 6 mm polyps in the rectum and in the                            transverse colon, removed with a cold snare.                            Resected and retrieved.                           - Non-bleeding internal hemorrhoids. Recommendation:           - Patient has a contact number available for                            emergencies. The signs and symptoms of potential                            delayed complications were discussed with the                            patient. Return to normal activities tomorrow.                            Written discharge instructions were provided to the                             patient.                           - Resume previous diet.                           - Continue present medications.                           - Await pathology results.                           - Repeat colonoscopy in 5-10 years for surveillance                            based on pathology results. Mauri Pole, MD 07/01/2016 11:43:34 AM This report has been signed electronically.

## 2016-07-01 NOTE — Progress Notes (Signed)
To recovery vss report to RN

## 2016-07-01 NOTE — Progress Notes (Signed)
Called to room to assist during endoscopic procedure.  Patient ID and intended procedure confirmed with present staff. Received instructions for my participation in the procedure from the performing physician.  

## 2016-07-04 ENCOUNTER — Telehealth: Payer: Self-pay | Admitting: *Deleted

## 2016-07-04 NOTE — Telephone Encounter (Signed)
  Follow up Call-  Call back number 07/01/2016  Post procedure Call Back phone  # [phone]  Permission to leave phone message Yes  Some recent data might be hidden     Patient questions:  Do you have a fever, pain , or abdominal swelling? No. Pain Score  0 *  Have you tolerated food without any problems? Yes.    Have you been able to return to your normal activities? Yes.    Do you have any questions about your discharge instructions: Diet   No. Medications  No. Follow up visit  No.  Do you have questions or concerns about your Care? No.  Actions: * If pain score is 4 or above: No action needed, pain <4.

## 2016-07-06 ENCOUNTER — Encounter: Payer: Self-pay | Admitting: Gastroenterology

## 2016-07-19 ENCOUNTER — Ambulatory Visit (INDEPENDENT_AMBULATORY_CARE_PROVIDER_SITE_OTHER): Payer: Medicaid Other | Admitting: Pulmonary Disease

## 2016-07-19 ENCOUNTER — Encounter: Payer: Self-pay | Admitting: Pulmonary Disease

## 2016-07-19 VITALS — BP 120/80 | HR 75 | Temp 97.5°F | Ht 62.0 in | Wt 194.5 lb

## 2016-07-19 DIAGNOSIS — Z91199 Patient's noncompliance with other medical treatment and regimen due to unspecified reason: Secondary | ICD-10-CM

## 2016-07-19 DIAGNOSIS — J3089 Other allergic rhinitis: Secondary | ICD-10-CM | POA: Diagnosis not present

## 2016-07-19 DIAGNOSIS — F17210 Nicotine dependence, cigarettes, uncomplicated: Secondary | ICD-10-CM

## 2016-07-19 DIAGNOSIS — Z9119 Patient's noncompliance with other medical treatment and regimen: Secondary | ICD-10-CM | POA: Diagnosis not present

## 2016-07-19 DIAGNOSIS — J4530 Mild persistent asthma, uncomplicated: Secondary | ICD-10-CM

## 2016-07-19 MED ORDER — BECLOMETHASONE DIPROPIONATE 80 MCG/ACT IN AERS: 2.0000 | INHALATION_SPRAY | Freq: Two times a day (BID) | RESPIRATORY_TRACT | 6 refills | Status: DC

## 2016-07-19 MED ORDER — ALBUTEROL SULFATE HFA 108 (90 BASE) MCG/ACT IN AERS: 1.0000 | INHALATION_SPRAY | Freq: Four times a day (QID) | RESPIRATORY_TRACT | 6 refills | Status: DC | PRN

## 2016-07-19 NOTE — Patient Instructions (Signed)
Today we updated your med list in our EPIC system...    Continue your current medications the same...  Chrys, the most important factors are:    #1 - Quit all smoking...    #2 - take the QVar80 - 2 sprays twice daily...  Continue the ALBUTEROL rescue inhaler as needed...  Watch out for the hot humid days & stay in the air conditioning...  Call for any questions...  Let's plan a follow up visit in 2mo, sooner if needed for problems.Marland KitchenMarland Kitchen

## 2016-07-19 NOTE — Progress Notes (Signed)
Subjective:     [name], female   DOB: August 27, 1975, 41 y.o.   MRN: [id number]  HPI  ~  December 07, 2015:  Initial Pulmonary Consult by SN>      70 y/o BF referred by DrPavelock at the [facility] for a pulmonary evaluation due to dyspnea>     Pt states that she has had ASTHMA since birth; she remembers being treated w/ Pred & Albuterol as a child, similar intermittent therapy as a teen, and Pred, Qvar, Proair as an adult; she notes asthma attacks w/ wheezing, cough, SOB esp after an upper resp infection, but also w/ exposure to allergens, & change in the weather;  She smokes MJ but not cigarettes; States that at age 81 she found out she was allergic to Christmas trees;  She had prev allergy eval by DrSharma ~74yrs ago by her hx & was on allergy shots regularly until her last preg in 2016 when shots were held & never restarted (we do not have records from [place] & have called for these)...     She has gone to the ER/ UC on several occas- most recently 11/2-3/17 & 10/08/15> records reviewed-- no wheezing heard, CXR was clear, treated w/ Duoneb/ Depo/ Pred...     Med list includes Qvar80-2spBid & ProairHFA prn- she is not using the Qvar regularly... EXAM shows Afeb, VSS, O2sat=98% on RA;  HEENT- neg, mallampati2, palp thyroid;  Chest- clear w/o w/r/r;  Heart- RR w/o m/r/g heard;  Abd- soft, nontender, neg;  Ext- neg w/o c/c/e;  Neuro- intact...  CXR 12/03/15 at [facility]>  Norm heart size, clear lungs, NAD...  Spirometry here 12/07/15>  FVC=2.91 (101%), FEV1=1.92 (80%), %1sec=66%, mid-flows are reduced at 52% predicted.  Ambulatory oximetry on RA>  O2sat=97% at rest w/ pulse=85;  She ambulated 3 Laps (185'ea) w/ lowest O2sat=96% w/ pulse=114... 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 Etc... We plan ROV in 6wks.  ADDENDUM>>  Records received from DrSharma>> seen 10/2013 thru 12/2014 w/ AR & mild persistent Asthma on Qvar40-2spBid, ProairHFA prn but pt had compliance issues and never used the Qvar regularly...  Allergy Testing>  Pos for dust mites> cockroach> Dog dander> sm reaction to a few trees...  Spirometry>  FVC=2.89 (101%), FEV1=1.86 (76%), %1sec=64, mid-flows reduced at 41% predicted...  ~  January 18, 2016:  6wk ROV w/ SN>  Catherine Osborne is improved on the QVar80-2spBid regularly, Pred taper- down to 10mg /d now, Albut vs HFA prn;  She has not had an asthma flair since her last visit;  She also tells me that she was not 100% honest- she was smoking cigarettes & little cigars (since age 73, up to 1/2 pack per day) and she notes that this definitely affects her asthma;  She also didn't take the Prednisone as directed=> off now...     ASTHMA & mild COPD- GOLD Stage1> on QVar80-2spBid, ProairHFA prn, Mucinex prn; main triggers are URIs, exposure to allergens, change in weather.Marland KitchenMarland KitchenMarland Kitchen  Cigarette & other smoking> advised to quit & stay quit.    Hx poor compliance w/ med recommendations...    Allergies> eval per DrSharma, off immunotherapy at present. EXAM shows Afeb, VSS, O2sat=98% on RA;  HEENT- neg, mallampati2, palp thyroid;  Chest- clear w/o w/r/r;  Heart- RR w/o m/r/g heard;  Abd- soft, nontender, neg;  Ext- neg w/o c/c/e;  Neuro- intact... IMP/PLAN>>  Catherine Osborne is rec to quit all smoking & stay quit, avoid infections, and comply w/ all recommended vaccinations;  Use the QVar80-2spBid, ProairHFA as needed, Mucinex600 1-2Bid w/ fluids...   ~  July 19, 2016:  42mo  ROV & Catherine Osborne admits to continued smoking- currently ~1/3rd PPD "due to stress" she says;  Her asthma has been adeq controlled on meds- QVar80-2spBid + prn Albuterol via Neb or HFA as needed;  She has a long hx of poor compliance w/ med rx & ?is again using the QVar just as needed?  She shares a rescue inhaler w/ her son;  She denies much cough or sputum, no hemoptysis, says she is not "tight or wheezy", no CP, and SB at baseline- the weather & humidity are an issue;  She is not engaged in a regular exercise program...     EXAM shows Afeb, VSS, O2sat=97% on RA;  HEENT- neg, mallampati2, palp thyroid;  Chest- clear w/o w/r/r;  Heart- RR w/o m/r/g heard;  Abd- soft, nontender, neg;  Ext- neg w/o c/c/e;  Neuro- intact... IMP/PLAN>>  Catherine Osborne is her own worst enemy- needs to quit all smoking and take her prescribed meds regularly; even so her asthma has been under reasonable control w/o interval exacerbations, ER visit/ Hosp, etc;   Meds can be refllled by her PCP and prn follow up in pulm office for any respiratory issues...     Past Medical History:  Diagnosis Date  . Allergy   . Asthma   . Headache   . 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 Pneumonia   . Prior miscarriage with pregnancy in first trimester, antepartum   . Vaginal Pap smear, abnormal    Left nephrolithiasis (11mm lower pole) seen on prev Abd sonar 05/2015  Anemia w/ Hg=11.9, MCV=84  Hx low serum Albumin ~2.6-2.7 range  Palp thyroid & left thyroid nodule - eval by DrWyatt, [facility] in 2013   Past Surgical History:  Procedure Laterality Date  . CERVICAL CERCLAGE N/A 02/26/2015   Procedure: CERCLAGE CERVICAL;  Surgeon: Frederico Hamman, MD;  Location: [facility];  Service: Gynecology;  Laterality: N/A;  . DILATION AND CURETTAGE OF UTERUS    . DILATION AND EVACUATION N/A 05/22/2012   Procedure: DILATATION AND EVACUATION;  Surgeon: Frederico Hamman, MD;  Location: [facility];  Service: Gynecology;  Laterality: N/A;  . FOOT SURGERY       Outpatient Encounter Prescriptions as of 07/19/2016  Medication Sig  . albuterol (PROVENTIL HFA;VENTOLIN HFA) 108 (90 Base) MCG/ACT inhaler Inhale 1-2 puffs into the lungs every 6 (six) hours as needed for wheezing or shortness of breath.  Marland Kitchen albuterol (PROVENTIL) (2.5 MG/3ML) 0.083% nebulizer solution Take 3 mLs (2.5 mg total) by nebulization every 6 (six) hours as needed for wheezing or shortness of breath.  . beclomethasone (QVAR) 80 MCG/ACT inhaler Inhale 2 puffs into the lungs 2 (two) times daily.  . valACYclovir (VALTREX) 500 MG tablet Take 1 tablet (500 mg total) by mouth 2 (two) times daily. Take for 3 days prn.  . [DISCONTINUED] albuterol (PROVENTIL HFA;VENTOLIN HFA) 108 (90 Base) MCG/ACT  inhaler Inhale 1-2 puffs into the lungs every 6 (six) hours as needed for wheezing or shortness of breath.  . [DISCONTINUED] beclomethasone (QVAR) 80 MCG/ACT inhaler Inhale 2 puffs into the lungs 2 (two) times daily.  . methocarbamol (ROBAXIN) 500 MG tablet Take 1 tablet (500 mg total) by mouth 2 (two) times daily. (Patient not taking: Reported on 07/19/2016)  . predniSONE (DELTASONE) 10 MG tablet Take as directed by Dr. Lenna Gilford (Patient not taking: Reported on 07/19/2016)   Facility-Administered Encounter Medications as of 07/19/2016  Medication  . 0.9 %  sodium chloride infusion    No Known Allergies  She had allergy eval from DrSharma & was on shots in 2016   Immunization History  Administered Date(s) Administered  . Tdap 06/13/2015    Current Medications, Allergies, Past Medical History, Past Surgical History, Family History, and Social History were reviewed in [employer] record.   Review of Systems             All symptoms NEG except where BOLDED >>  Constitutional:  F/C/S, fatigue, anorexia, unexpected weight change. HEENT:  HA, visual changes, hearing loss, earache, nasal symptoms, sore throat, mouth sores, hoarseness. Resp:  cough, sputum, hemoptysis; SOB,  tightness, wheezing. Cardio:  CP, palpit, DOE, orthopnea, edema. GI:  N/V/D/C, blood in stool; reflux, abd pain, distention, gas. GU:  dysuria, freq, urgency, hematuria, flank pain, voiding difficulty. MS:  joint pain, swelling, tenderness, decr ROM; neck pain, back pain, etc. Neuro:  HA, tremors, seizures, dizziness, syncope, weakness, numbness, gait abn. Skin:  suspicious lesions or skin rash. Heme:  adenopathy, bruising, bleeding. Psyche:  confusion, agitation, sleep disturbance, hallucinations, anxiety, depression suicidal.   Objective:   Physical Exam       Vital Signs:  Reviewed...   General:  WD, WN, 41 y/o BF in NAD; alert & oriented; pleasant & cooperative... HEENT:  [place]/AT; Conjunctiva- pink, Sclera- nonicteric, EOM-wnl, PERRLA; EACs-clear, TMs-wnl; NOSE-clear; THROAT-clear & wnl.  Neck:  Supple w/ full ROM; no JVD; normal carotid impulses w/o bruits; palp thyroid; no lymphadenopathy.  Chest:  Clear to P & A; without wheezes, rales, or rhonchi heard. Heart:  Regular Rhythm; norm S1 & S2 without murmurs, rubs, or gallops detected. Abdomen:  Soft & nontender- no guarding or rebound; normal bowel sounds; no organomegaly or masses palpated. Ext:  Normal ROM; without deformities or arthritic changes; no varicose veins, venous insuffic, or edema;  Pulses intact w/o bruits. Neuro:  CNs II-XII intact; motor testing normal; sensory testing normal; gait normal & balance OK. Derm:  No lesions noted; no rash etc. Lymph:  No cervical, supraclavicular, axillary, or inguinal adenopathy palpated.   Assessment:       IMP >>     ASTHMA - mild persistent>  We discussed a tapering course of Pred + QVAR80-2spBid & ProairHFA as needed...    Ex-smoker> she had been smoking ~1/2 ppd & was not honest w/ [place] when 1st seen...    Underlying allergies>  Prev eval by DrSharma, she was on shots in 2016, we will try to get records...    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 Etc... We plan ROV in 6wks. 01/18/16>   Catherine Osborne is rec to quit all smoking & stay quit, avoid infections, and comply w/all recommended vaccinations;  Use the QVar80-2spBid, ProairHFA as needed, Mucinex600 1-2Bid w/ fluids.. 07/19/16>   Catherine Osborne is her own worst enemy- needs to quit all smoking and take her prescribed meds regularly; even so her asthma has been under reasonable control w/o interval exacerbations, ER visit/ Hosp, etc;   Meds can be refllled by her PCP and prn follow up in pulm office for any respiratory issues...     Plan:     Patient's Medications  New Prescriptions   No medications on file  Previous Medications   ALBUTEROL (PROVENTIL) (2.5 MG/3ML) 0.083% NEBULIZER SOLUTION    Take 3 mLs (2.5 mg total) by nebulization every 6 (six) hours as needed for wheezing or shortness of breath.   METHOCARBAMOL (ROBAXIN) 500 MG TABLET    Take 1 tablet (500 mg total) by mouth 2 (two) times daily.   PREDNISONE (DELTASONE) 10 MG TABLET    Take as directed by Dr. Lenna Gilford   VALACYCLOVIR (VALTREX) 500 MG TABLET    Take 1 tablet (500 mg total) by mouth 2 (two) times daily. Take for 3 days prn.  Modified Medications   Modified Medication Previous Medication   ALBUTEROL (PROVENTIL HFA;VENTOLIN HFA) 108 (90 BASE) MCG/ACT INHALER albuterol (PROVENTIL HFA;VENTOLIN HFA) 108 (90 Base) MCG/ACT inhaler      Inhale 1-2 puffs into the lungs every 6  (six) hours as needed for wheezing or shortness of breath.    Inhale 1-2 puffs into the lungs every 6 (six) hours as needed for wheezing or shortness of breath.   BECLOMETHASONE (QVAR) 80 MCG/ACT INHALER beclomethasone (QVAR) 80 MCG/ACT inhaler      Inhale 2 puffs into the lungs 2 (two) times daily.    Inhale 2 puffs into the lungs 2 (two) times daily.  Discontinued Medications   No medications on file

## 2016-07-21 ENCOUNTER — Telehealth: Payer: Self-pay | Admitting: Pulmonary Disease

## 2016-07-21 MED ORDER — BECLOMETHASONE DIPROP HFA 80 MCG/ACT IN AERB: 2.0000 | INHALATION_SPRAY | Freq: Two times a day (BID) | RESPIRATORY_TRACT | 11 refills | Status: DC

## 2016-07-21 NOTE — Telephone Encounter (Signed)
The redihaler has been sent to the pharmacy and nothing further is needed.

## 2016-08-05 ENCOUNTER — Telehealth: Payer: Self-pay | Admitting: Pulmonary Disease

## 2016-08-05 MED ORDER — BECLOMETHASONE DIPROP HFA 80 MCG/ACT IN AERB: 2.0000 | INHALATION_SPRAY | Freq: Two times a day (BID) | RESPIRATORY_TRACT | 11 refills | Status: DC

## 2016-08-05 NOTE — Telephone Encounter (Signed)
Refill has been sent to the pharmacy. Nothing further is needed. 

## 2016-09-30 ENCOUNTER — Ambulatory Visit: Payer: Medicaid Other | Admitting: Podiatry

## 2016-10-17 ENCOUNTER — Encounter: Payer: Self-pay | Admitting: Podiatry

## 2016-10-17 ENCOUNTER — Ambulatory Visit: Payer: Medicaid Other

## 2016-10-17 DIAGNOSIS — M79672 Pain in left foot: Secondary | ICD-10-CM

## 2016-10-27 NOTE — Progress Notes (Signed)
This encounter was created in error - please disregard.

## 2016-11-30 ENCOUNTER — Ambulatory Visit (INDEPENDENT_AMBULATORY_CARE_PROVIDER_SITE_OTHER): Payer: Medicaid Other | Admitting: Obstetrics

## 2016-11-30 ENCOUNTER — Encounter: Payer: Self-pay | Admitting: Obstetrics

## 2016-11-30 ENCOUNTER — Other Ambulatory Visit (HOSPITAL_COMMUNITY)
Admission: RE | Admit: 2016-11-30 | Discharge: 2016-11-30 | Disposition: A | Discharge status: Home / Self Care | Payer: Medicaid Other | Source: Ambulatory Visit | Attending: Obstetrics | Admitting: Obstetrics

## 2016-11-30 VITALS — BP 115/78 | HR 87 | Ht 62.0 in | Wt 184.4 lb

## 2016-11-30 DIAGNOSIS — Z01419 Encounter for gynecological examination (general) (routine) without abnormal findings: Secondary | ICD-10-CM | POA: Insufficient documentation

## 2016-11-30 DIAGNOSIS — N898 Other specified noninflammatory disorders of vagina: Secondary | ICD-10-CM

## 2016-11-30 DIAGNOSIS — Z0000 Encounter for general adult medical examination without abnormal findings: Secondary | ICD-10-CM

## 2016-11-30 DIAGNOSIS — L0232 Furuncle of buttock: Secondary | ICD-10-CM

## 2016-11-30 DIAGNOSIS — Z113 Encounter for screening for infections with a predominantly sexual mode of transmission: Secondary | ICD-10-CM

## 2016-11-30 DIAGNOSIS — E669 Obesity, unspecified: Secondary | ICD-10-CM

## 2016-11-30 DIAGNOSIS — Z3009 Encounter for other general counseling and advice on contraception: Secondary | ICD-10-CM

## 2016-11-30 MED ORDER — AMOXICILLIN-POT CLAVULANATE 875-125 MG PO TABS: 1.0000 | ORAL_TABLET | Freq: Two times a day (BID) | ORAL | 1 refills | Status: DC

## 2016-11-30 NOTE — Progress Notes (Signed)
Subjective:        Catherine Osborne is a 41 y.o. female here for a routine exam.  Current complaints: Boil on buttocks.    Personal health questionnaire:  Is patient Catherine Osborne, have a family history of breast and/or ovarian cancer: no Is there a family history of uterine cancer diagnosed at age < 23, gastrointestinal cancer, urinary tract cancer, family member who is a Field seismologist syndrome-associated carrier: no Is the patient overweight and hypertensive, family history of diabetes, personal history of gestational diabetes, preeclampsia or PCOS: no Is patient over 35, have PCOS,  family history of premature CHD under age 17, diabetes, smoke, have hypertension or peripheral artery disease:  no At any time, has a partner hit, kicked or otherwise hurt or frightened you?: no Over the past 2 weeks, have you felt down, depressed or hopeless?: no Over the past 2 weeks, have you felt little interest or pleasure in doing things?:no   Gynecologic History Patient's last menstrual period was 10/05/2016. Contraception: none Last Pap: 2017. Results were: normal Last mammogram: n/a. Results were: n/a  Obstetric History OB History  Gravida Para Term Preterm AB Living  14 2 1 1 12 1   SAB TAB Ectopic Multiple Live Births  10 2   0 1    # Outcome Date GA Lbr Len/2nd Weight Sex Delivery Anes PTL Lv  14 Term 06/12/15 [redacted]w[redacted]d 19:15 / 00:19 7 lb 0.7 oz (3.196 kg) M Vag-Spont EPI  LIV  13 Preterm  [redacted]w[redacted]d    Vag-Spont     12 SAB           11 SAB           10 SAB           9 SAB           8 SAB           7 SAB           6 SAB           5 SAB           4 SAB           3 TAB           2 TAB           1 SAB               Past Medical History:  Diagnosis Date  . Allergy   . Asthma   . Headache   . 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 Pneumonia   . Prior miscarriage with pregnancy in first trimester, antepartum   . Vaginal Pap smear, abnormal     Past Surgical History:  Procedure Laterality  Date  . CERVICAL CERCLAGE N/A 02/26/2015   Procedure: CERCLAGE CERVICAL;  Surgeon: Frederico Hamman, MD;  Location: [facility];  Service: Gynecology;  Laterality: N/A;  . DILATION AND CURETTAGE OF UTERUS    . DILATION AND EVACUATION N/A 05/22/2012   Procedure: DILATATION AND EVACUATION;  Surgeon: Frederico Hamman, MD;  Location: [facility];  Service: Gynecology;  Laterality: N/A;  . FOOT SURGERY       Current Outpatient Prescriptions:  .  albuterol (PROVENTIL HFA;VENTOLIN HFA) 108 (90 Base) MCG/ACT inhaler, Inhale 1-2 puffs into the lungs every 6 (six) hours as needed for wheezing or shortness of breath., Disp: 1 Inhaler, Rfl: 6 .  albuterol (PROVENTIL) (2.5 MG/3ML) 0.083% nebulizer solution, Take 3 mLs (2.5 mg  total) by nebulization every 6 (six) hours as needed for wheezing or shortness of breath., Disp: 75 mL, Rfl: 12 .  beclomethasone (QVAR REDIHALER) 80 MCG/ACT inhaler, Inhale 2 puffs into the lungs 2 (two) times daily., Disp: 10.6 g, Rfl: 11 .  beclomethasone (QVAR REDIHALER) 80 MCG/ACT inhaler, Inhale 2 puffs into the lungs 2 (two) times daily., Disp: 10.6 g, Rfl: 11 .  meloxicam (MOBIC) 15 MG tablet, TK 1 T PO D, Disp: , Rfl: 0 .  valACYclovir (VALTREX) 500 MG tablet, Take 1 tablet (500 mg total) by mouth 2 (two) times daily. Take for 3 days prn., Disp: 30 tablet, Rfl: prn .  amoxicillin-clavulanate (AUGMENTIN) 875-125 MG tablet, Take 1 tablet by mouth 2 (two) times daily. 1 tablet po BID x10 days, Disp: 28 tablet, Rfl: 1 .  methocarbamol (ROBAXIN) 500 MG tablet, Take 1 tablet (500 mg total) by mouth 2 (two) times daily. (Patient not taking: Reported on 07/19/2016), Disp: 20 tablet, Rfl: 0 .  predniSONE (DELTASONE) 10 MG tablet, Take as directed by Dr. Lenna Gilford (Patient not taking: Reported on 07/19/2016), Disp: 40 tablet, Rfl: 0  Current Facility-Administered Medications:  .  0.9 %  sodium chloride infusion, 500 mL, Intravenous, Continuous, Nandigam, Venia Minks, MD No Known Allergies  Social  History  Substance Use Topics  . Smoking status: Current Some Day Smoker    Packs/day: 1.00    Years: 20.00    Types: Cigarettes    Last attempt to quit: 01/05/2014  . Smokeless tobacco: Never Used     Comment: marijuana/30 mins ago  . Alcohol use 0.6 oz/week    1 Glasses of wine per week     Comment: rare    Family History  Problem Relation Age of Onset  . Adopted: Yes  . Bone cancer Mother   . Breast cancer Maternal Grandmother   . Diabetes Unknown   . Hypertension Unknown   . Stroke Unknown   . Heart attack Unknown       Review of Systems  Constitutional: negative for fatigue and weight loss Respiratory: negative for cough and wheezing Cardiovascular: negative for chest pain, fatigue and palpitations Gastrointestinal: negative for abdominal pain and change in bowel habits Musculoskeletal:negative for myalgias Neurological: negative for gait problems and tremors Behavioral/Psych: negative for abusive relationship, depression Endocrine: negative for temperature intolerance    Genitourinary:negative for abnormal menstrual periods, genital lesions, hot flashes, sexual problems and vaginal discharge Integument/breast: negative for breast lump, breast tenderness, nipple discharge and skin lesion(s)    Objective:       BP 115/78   Pulse 87   Ht 5\' 2"  (1.575 m)   Wt 184 lb 6.4 oz (83.6 kg)   LMP 10/05/2016   Breastfeeding? No   BMI 33.73 kg/m  General:   alert  Skin:   no rash or abnormalities  Lungs:   clear to auscultation bilaterally  Heart:   regular rate and rhythm, S1, S2 normal, no murmur, click, rub or gallop  Breasts:   normal without suspicious masses, skin or nipple changes or axillary nodes  Abdomen:  normal findings: no organomegaly, soft, non-tender and no hernia  Pelvis:  External genitalia: normal general appearance Urinary system: urethral meatus normal and bladder without fullness, nontender Vaginal: normal without tenderness, induration or  masses Cervix: normal appearance Adnexa: normal bimanual exam Uterus: anteverted and non-tender, normal size   Lab Review Urine pregnancy test Labs reviewed yes Radiologic studies reviewed no  50% of 20 min visit spent on  counseling and coordination of care.    Assessment:     1. Encounter for routine gynecological examination with Papanicolaou smear of cervix Rx: - Cytology - PAP - Ambulatory referral to Urogynecology  2. Encounter for other general counseling and advice on contraception - declines contraception  3. Obesity (BMI 30.0-34.9) - program of caloric reduction, exercise and behavioral modification recommended  4. Vaginal discharge Rx: - Cervicovaginal ancillary only  5. Boil of buttock Rx: - amoxicillin-clavulanate (AUGMENTIN) 875-125 MG tablet; Take 1 tablet by mouth 2 (two) times daily. 1 tablet po BID x10 days  Dispense: 28 tablet; Refill: 1  6. Screening for STD (sexually transmitted disease) Rx: - HIV antibody - Hepatitis B surface antigen - RPR - Hepatitis C antibody   Plan:    Education reviewed: calcium supplements, depression evaluation, low fat, low cholesterol diet, safe sex/STD prevention, self breast exams, smoking cessation and weight bearing exercise. Contraception: none. Follow up in: 1 year.   Meds ordered this encounter  Medications  . amoxicillin-clavulanate (AUGMENTIN) 875-125 MG tablet    Sig: Take 1 tablet by mouth 2 (two) times daily. 1 tablet po BID x10 days    Dispense:  28 tablet    Refill:  1   Orders Placed This Encounter  Procedures  . HIV antibody  . Hepatitis B surface antigen  . RPR  . Hepatitis C antibody  . Ambulatory referral to Urogynecology    Referral Priority:   Routine    Referral Type:   Consultation    Referral Reason:   Specialty Services Required    Requested Specialty:   Urology    Number of Visits Requested:   1

## 2016-11-30 NOTE — Progress Notes (Signed)
Patient is in the office for annual, complains of possible boil and white discharge. Last pap 11-30-15.

## 2016-12-01 LAB — CERVICOVAGINAL ANCILLARY ONLY
Bacterial vaginitis: NEGATIVE
Candida vaginitis: NEGATIVE
Chlamydia: NEGATIVE
NEISSERIA GONORRHEA: POSITIVE — AB
TRICH (WINDOWPATH): POSITIVE — AB

## 2016-12-01 LAB — HEPATITIS C ANTIBODY

## 2016-12-01 LAB — RPR: RPR Ser Ql: NONREACTIVE

## 2016-12-01 LAB — HIV ANTIBODY (ROUTINE TESTING W REFLEX): HIV SCREEN 4TH GENERATION: NONREACTIVE

## 2016-12-01 LAB — HEPATITIS B SURFACE ANTIGEN: HEP B S AG: NEGATIVE

## 2016-12-04 ENCOUNTER — Emergency Department (HOSPITAL_COMMUNITY): Payer: Medicaid Other

## 2016-12-04 ENCOUNTER — Emergency Department (HOSPITAL_COMMUNITY)
Admission: EM | Admit: 2016-12-04 | Discharge: 2016-12-04 | Disposition: A | Discharge status: Home / Self Care | Payer: Medicaid Other | Attending: Emergency Medicine | Admitting: Emergency Medicine

## 2016-12-04 DIAGNOSIS — J45909 Unspecified asthma, uncomplicated: Secondary | ICD-10-CM | POA: Insufficient documentation

## 2016-12-04 DIAGNOSIS — N390 Urinary tract infection, site not specified: Secondary | ICD-10-CM | POA: Diagnosis not present

## 2016-12-04 DIAGNOSIS — R1084 Generalized abdominal pain: Secondary | ICD-10-CM

## 2016-12-04 DIAGNOSIS — F17210 Nicotine dependence, cigarettes, uncomplicated: Secondary | ICD-10-CM | POA: Insufficient documentation

## 2016-12-04 DIAGNOSIS — R1011 Right upper quadrant pain: Secondary | ICD-10-CM | POA: Diagnosis present

## 2016-12-04 DIAGNOSIS — R8290 Unspecified abnormal findings in urine: Secondary | ICD-10-CM

## 2016-12-04 LAB — CBC WITH DIFFERENTIAL/PLATELET
BASOS ABS: 0 10*3/uL (ref 0.0–0.1)
Basophils Relative: 0 %
EOS PCT: 0 %
Eosinophils Absolute: 0 10*3/uL (ref 0.0–0.7)
HEMATOCRIT: 42.7 % (ref 36.0–46.0)
Hemoglobin: 14.6 g/dL (ref 12.0–15.0)
LYMPHS ABS: 1.5 10*3/uL (ref 0.7–4.0)
LYMPHS PCT: 12 %
MCH: 28.7 pg (ref 26.0–34.0)
MCHC: 34.2 g/dL (ref 30.0–36.0)
MCV: 84.1 fL (ref 78.0–100.0)
MONO ABS: 1.1 10*3/uL — AB (ref 0.1–1.0)
MONOS PCT: 8 %
NEUTROS ABS: 10.3 10*3/uL — AB (ref 1.7–7.7)
Neutrophils Relative %: 80 %
Platelets: 280 10*3/uL (ref 150–400)
RBC: 5.08 MIL/uL (ref 3.87–5.11)
RDW: 14.5 % (ref 11.5–15.5)
WBC: 13 10*3/uL — ABNORMAL HIGH (ref 4.0–10.5)

## 2016-12-04 LAB — COMPREHENSIVE METABOLIC PANEL
ALK PHOS: 116 U/L (ref 38–126)
ALT: 25 U/L (ref 14–54)
ANION GAP: 9 (ref 5–15)
AST: 28 U/L (ref 15–41)
Albumin: 3.5 g/dL (ref 3.5–5.0)
BILIRUBIN TOTAL: 0.8 mg/dL (ref 0.3–1.2)
BUN: 6 mg/dL (ref 6–20)
CO2: 21 mmol/L — ABNORMAL LOW (ref 22–32)
Calcium: 8.8 mg/dL — ABNORMAL LOW (ref 8.9–10.3)
Chloride: 103 mmol/L (ref 101–111)
Creatinine, Ser: 0.66 mg/dL (ref 0.44–1.00)
GFR calc non Af Amer: >60 mL/min (ref >60)
Glucose, Bld: 122 mg/dL — ABNORMAL HIGH (ref 65–99)
POTASSIUM: 4 mmol/L (ref 3.5–5.1)
Sodium: 133 mmol/L — ABNORMAL LOW (ref 135–145)
TOTAL PROTEIN: 7.2 g/dL (ref 6.5–8.1)

## 2016-12-04 LAB — URINALYSIS, ROUTINE W REFLEX MICROSCOPIC
BILIRUBIN URINE: NEGATIVE
Glucose, UA: NEGATIVE mg/dL
Hgb urine dipstick: NEGATIVE
KETONES UR: 5 mg/dL — AB
NITRITE: NEGATIVE
PH: 6 (ref 5.0–8.0)
Protein, ur: NEGATIVE mg/dL
Specific Gravity, Urine: 1.041 — ABNORMAL HIGH (ref 1.005–1.030)

## 2016-12-04 LAB — LIPASE, BLOOD: LIPASE: 20 U/L (ref 11–51)

## 2016-12-04 LAB — I-STAT BETA HCG BLOOD, ED (MC, WL, AP ONLY): I-stat hCG, quantitative: <5 m[IU]/mL (ref <5)

## 2016-12-04 MED ORDER — CEPHALEXIN 250 MG PO CAPS: 250.0000 mg | ORAL_CAPSULE | Freq: Four times a day (QID) | ORAL | 0 refills | Status: DC

## 2016-12-04 MED ORDER — IOPAMIDOL (ISOVUE-300) INJECTION 61%
100.0000 mL | Freq: Once | INTRAVENOUS | Status: AC | PRN
  Administered 2016-12-04: 100 mL via INTRAVENOUS
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
## 2016-12-04 NOTE — ED Notes (Signed)
Pt given juice per request, attempting to call for a ride at this time

## 2016-12-04 NOTE — ED Notes (Signed)
Patient transported to CT 

## 2016-12-05 ENCOUNTER — Other Ambulatory Visit: Payer: Self-pay

## 2016-12-05 ENCOUNTER — Encounter (HOSPITAL_COMMUNITY): Payer: Self-pay | Admitting: Emergency Medicine

## 2016-12-05 ENCOUNTER — Emergency Department (HOSPITAL_COMMUNITY)
Admission: EM | Admit: 2016-12-05 | Discharge: 2016-12-05 | Disposition: A | Discharge status: Home / Self Care | Payer: Medicaid Other | Attending: Emergency Medicine | Admitting: Emergency Medicine

## 2016-12-05 ENCOUNTER — Other Ambulatory Visit: Payer: Self-pay | Admitting: Obstetrics

## 2016-12-05 DIAGNOSIS — Z79899 Other long term (current) drug therapy: Secondary | ICD-10-CM | POA: Diagnosis not present

## 2016-12-05 DIAGNOSIS — F17210 Nicotine dependence, cigarettes, uncomplicated: Secondary | ICD-10-CM | POA: Insufficient documentation

## 2016-12-05 DIAGNOSIS — R1084 Generalized abdominal pain: Secondary | ICD-10-CM | POA: Diagnosis not present

## 2016-12-05 DIAGNOSIS — N764 Abscess of vulva: Secondary | ICD-10-CM | POA: Insufficient documentation

## 2016-12-05 DIAGNOSIS — N390 Urinary tract infection, site not specified: Secondary | ICD-10-CM | POA: Insufficient documentation

## 2016-12-05 DIAGNOSIS — L0291 Cutaneous abscess, unspecified: Secondary | ICD-10-CM

## 2016-12-05 DIAGNOSIS — R112 Nausea with vomiting, unspecified: Secondary | ICD-10-CM | POA: Diagnosis not present

## 2016-12-05 DIAGNOSIS — J45909 Unspecified asthma, uncomplicated: Secondary | ICD-10-CM | POA: Insufficient documentation

## 2016-12-05 LAB — CBC WITH DIFFERENTIAL/PLATELET
BASOS ABS: 0 10*3/uL (ref 0.0–0.1)
BASOS PCT: 0 %
Eosinophils Absolute: 0 10*3/uL (ref 0.0–0.7)
Eosinophils Relative: 0 %
HCT: 44.7 % (ref 36.0–46.0)
Hemoglobin: 15.4 g/dL — ABNORMAL HIGH (ref 12.0–15.0)
LYMPHS PCT: 8 %
Lymphs Abs: 1.5 10*3/uL (ref 0.7–4.0)
MCH: 28.7 pg (ref 26.0–34.0)
MCHC: 34.5 g/dL (ref 30.0–36.0)
MCV: 83.2 fL (ref 78.0–100.0)
Monocytes Absolute: 1.4 10*3/uL — ABNORMAL HIGH (ref 0.1–1.0)
Monocytes Relative: 7 %
Neutro Abs: 16.8 10*3/uL — ABNORMAL HIGH (ref 1.7–7.7)
Neutrophils Relative %: 85 %
Platelets: 289 10*3/uL (ref 150–400)
RBC: 5.37 MIL/uL — AB (ref 3.87–5.11)
RDW: 14.1 % (ref 11.5–15.5)
WBC: 19.8 10*3/uL — AB (ref 4.0–10.5)

## 2016-12-05 LAB — COMPREHENSIVE METABOLIC PANEL
ALBUMIN: 3.3 g/dL — AB (ref 3.5–5.0)
ALT: 20 U/L (ref 14–54)
AST: 20 U/L (ref 15–41)
Alkaline Phosphatase: 115 U/L (ref 38–126)
Anion gap: 12 (ref 5–15)
BUN: 6 mg/dL (ref 6–20)
CALCIUM: 9.1 mg/dL (ref 8.9–10.3)
CO2: 22 mmol/L (ref 22–32)
Chloride: 99 mmol/L — ABNORMAL LOW (ref 101–111)
Creatinine, Ser: 0.77 mg/dL (ref 0.44–1.00)
GFR calc Af Amer: >60 mL/min (ref >60)
GLUCOSE: 97 mg/dL (ref 65–99)
POTASSIUM: 3.2 mmol/L — AB (ref 3.5–5.1)
SODIUM: 133 mmol/L — AB (ref 135–145)
TOTAL PROTEIN: 7.5 g/dL (ref 6.5–8.1)
Total Bilirubin: 1 mg/dL (ref 0.3–1.2)

## 2016-12-05 LAB — URINALYSIS, ROUTINE W REFLEX MICROSCOPIC
Bilirubin Urine: NEGATIVE
Glucose, UA: NEGATIVE mg/dL
KETONES UR: 80 mg/dL — AB
Nitrite: NEGATIVE
PH: 6 (ref 5.0–8.0)
Protein, ur: 100 mg/dL — AB
Specific Gravity, Urine: 1.02 (ref 1.005–1.030)

## 2016-12-05 LAB — CYTOLOGY - PAP
Diagnosis: NEGATIVE
HPV (WINDOPATH): NOT DETECTED

## 2016-12-05 LAB — I-STAT CG4 LACTIC ACID, ED: LACTIC ACID, VENOUS: 0.51 mmol/L (ref 0.5–1.9)

## 2016-12-05 LAB — I-STAT BETA HCG BLOOD, ED (MC, WL, AP ONLY)

## 2016-12-05 MED ORDER — CEFIXIME 400 MG PO CAPS: 400.0000 mg | ORAL_CAPSULE | Freq: Once | ORAL | 0 refills | Status: AC

## 2016-12-05 MED ORDER — ONDANSETRON HCL 4 MG PO TABS: 4.0000 mg | ORAL_TABLET | Freq: Four times a day (QID) | ORAL | 0 refills | Status: DC

## 2016-12-05 MED ORDER — CEFTRIAXONE SODIUM 250 MG IJ SOLR: 250.0000 mg | Freq: Once | INTRAMUSCULAR | Status: DC

## 2016-12-05 MED ORDER — HYDROCODONE-ACETAMINOPHEN 5-325 MG PO TABS: 1.0000 | ORAL_TABLET | ORAL | 0 refills | Status: DC | PRN

## 2016-12-05 MED ORDER — METRONIDAZOLE 500 MG PO TABS: 2000.0000 mg | ORAL_TABLET | Freq: Once | ORAL | 0 refills | Status: AC

## 2016-12-05 MED ORDER — SODIUM CHLORIDE 0.9 % IV BOLUS (SEPSIS)
1000.0000 mL | Freq: Once | INTRAVENOUS | Status: AC
  Administered 2016-12-05: 1000 mL via INTRAVENOUS

## 2016-12-05 MED ORDER — DEXTROSE 5 % IV SOLN
1.0000 g | Freq: Once | INTRAVENOUS | Status: AC
  Administered 2016-12-05: 1 g via INTRAVENOUS
  Filled 2016-12-05: qty 10

## 2016-12-05 MED ORDER — ONDANSETRON HCL 4 MG/2ML IJ SOLN
4.0000 mg | Freq: Once | INTRAMUSCULAR | Status: AC
  Administered 2016-12-05: 4 mg via INTRAVENOUS
  Filled 2016-12-05: qty 2

## 2016-12-05 MED ORDER — HYDROMORPHONE HCL 1 MG/ML IJ SOLN
1.0000 mg | Freq: Once | INTRAMUSCULAR | Status: AC
  Administered 2016-12-05: 1 mg via INTRAVENOUS
  Filled 2016-12-05: qty 1

## 2016-12-05 MED ORDER — AZITHROMYCIN 250 MG PO TABS: 1000.0000 mg | ORAL_TABLET | Freq: Once | ORAL | 0 refills | Status: AC

## 2016-12-05 NOTE — ED Triage Notes (Signed)
Pt continues to have abdominal pain, has taken three doses of antibiotics but had one episode of emesis last night.  Pt reports not eating since Saturday night.

## 2016-12-05 NOTE — ED Notes (Signed)
Dr. Pollina at bedside at this time. 

## 2016-12-05 NOTE — ED Notes (Signed)
Patient had bent her L arm preventing NS bolus from infusing. Instructed patient to keep arm straightened so fluids can complete infusion.

## 2016-12-05 NOTE — ED Notes (Signed)
Pt unable to give a urine sample at this time.

## 2016-12-05 NOTE — ED Provider Notes (Addendum)
[facility] EMERGENCY DEPARTMENT Provider Note   CSN: [id number] Arrival date & time: 12/05/16  [id number]     History   Chief Complaint Chief Complaint  Patient presents with  . Emesis  . Abdominal Pain    HPI Catherine Osborne is a 41 y.o. female.  Patient presents to the emergency department for evaluation of abdominal pain.  Patient was seen in the ER for same complaint yesterday.  She reports that she was worked up and told she had a urinary tract infection.  Since she left the hospital, however, she has had persistent nausea and vomiting.  She reports that she has not been able to eat or drink.  She feels like her abdomen is bloated and she is having pain and cramps all over her abdomen.  No melena, rectal bleeding, diarrhea, constipation.      Past Medical History:  Diagnosis Date  . Allergy   . Asthma   . Headache   . 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 Pneumonia   . Prior miscarriage with pregnancy in first trimester, antepartum   . Vaginal Pap smear, abnormal     Patient Active Problem List   Diagnosis Date Noted  . Seasonal allergies 01/18/2016  . Cigarette smoker 01/18/2016  . Compliance poor 01/18/2016  . Asthma 12/07/2015  . Goiter diffuse 12/07/2015  . NSVD (normal spontaneous vaginal delivery) 06/13/2015  . Pregnancy induced hypertension 06/12/2015  . Preeclampsia 06/10/2015  . Short cervix affecting pregnancy 02/25/2015  . Advanced maternal age in multigravida 02/12/2015  . Left thyroid nodule 10/25/2011    Past Surgical History:  Procedure Laterality Date  . DILATION AND CURETTAGE OF UTERUS    . FOOT SURGERY      OB History    Gravida Para Term Preterm AB Living   14 2 1 1 12 1    SAB TAB Ectopic Multiple Live Births   10 2   0 1       Home Medications    Prior to Admission medications   Medication Sig Start Date End Date Taking? Authorizing Provider  albuterol (PROVENTIL HFA;VENTOLIN HFA) 108 (90 Base) MCG/ACT inhaler  Inhale 1-2 puffs into the lungs every 6 (six) hours as needed for wheezing or shortness of breath. 07/19/16  Yes Noralee Space, MD  albuterol (PROVENTIL) (2.5 MG/3ML) 0.083% nebulizer solution Take 3 mLs (2.5 mg total) by nebulization every 6 (six) hours as needed for wheezing or shortness of breath. 10/08/15  Yes Phelps, Erin O, PA-C  beclomethasone (QVAR REDIHALER) 80 MCG/ACT inhaler Inhale 2 puffs into the lungs 2 (two) times daily. 08/05/16  Yes Noralee Space, MD  cephALEXin (KEFLEX) 250 MG capsule Take 1 capsule (250 mg total) 4 (four) times daily by mouth. 12/04/16  Yes Montine Circle, PA-C  beclomethasone (QVAR REDIHALER) 80 MCG/ACT inhaler Inhale 2 puffs into the lungs 2 (two) times daily. Patient not taking: Reported on 12/04/2016 08/05/16   Noralee Space, MD  HYDROcodone-acetaminophen (NORCO/VICODIN) 5-325 MG tablet Take 1 tablet every 4 (four) hours as needed by mouth for moderate pain. 12/05/16   Orpah Greek, MD  ondansetron (ZOFRAN) 4 MG tablet Take 1 tablet (4 mg total) every 6 (six) hours by mouth. 12/05/16   Orpah Greek, MD  valACYclovir (VALTREX) 500 MG tablet Take 1 tablet (500 mg total) by mouth 2 (two) times daily. Take for 3 days prn. Patient not taking: Reported on 12/04/2016 11/30/15   Shelly Bombard, MD  Family History Family History  Adopted: Yes  Problem Relation Age of Onset  . Bone cancer Mother   . Breast cancer Maternal Grandmother   . Diabetes Unknown   . Hypertension Unknown   . Stroke Unknown   . Heart attack Unknown     Social History Social History   Tobacco Use  . Smoking status: Current Some Day Smoker    Packs/day: 1.00    Years: 20.00    Pack years: 20.00    Types: Cigarettes    Last attempt to quit: 01/05/2014    Years since quitting: 2.9  . Smokeless tobacco: Never Used  . Tobacco comment: marijuana/30 mins ago  Substance Use Topics  . Alcohol use: Yes    Alcohol/week: 0.6 oz    Types: 1 Glasses of wine per week     Comment: rare  . Drug use: Yes    Types: Marijuana    Comment: occasional use     Allergies   Patient has no known allergies.   Review of Systems Review of Systems  Gastrointestinal: Positive for abdominal pain, nausea and vomiting.  All other systems reviewed and are negative.    Physical Exam Updated Vital Signs BP 115/84   Pulse 82   Temp 98.8 F (37.1 C) (Oral)   Resp 16   LMP 12/05/2016 (Exact Date)   SpO2 98%   Physical Exam  Constitutional: She is oriented to person, place, and time. She appears well-developed and well-nourished. No distress.  HENT:  Head: Normocephalic and atraumatic.  Right Ear: Hearing normal.  Left Ear: Hearing normal.  Nose: Nose normal.  Mouth/Throat: Oropharynx is clear and moist and mucous membranes are normal.  Eyes: Conjunctivae and EOM are normal. Pupils are equal, round, and reactive to light.  Neck: Normal range of motion. Neck supple.  Cardiovascular: Regular rhythm, S1 normal and S2 normal. Exam reveals no gallop and no friction rub.  No murmur heard. Pulmonary/Chest: Effort normal and breath sounds normal. No respiratory distress. She exhibits no tenderness.  Abdominal: Soft. Normal appearance and bowel sounds are normal. There is no hepatosplenomegaly. There is generalized tenderness. There is no rebound, no guarding, no tenderness at McBurney's point and negative Murphy's sign. No hernia.  Musculoskeletal: Normal range of motion.  Neurological: She is alert and oriented to person, place, and time. She has normal strength. No cranial nerve deficit or sensory deficit. Coordination normal. GCS eye subscore is 4. GCS verbal subscore is 5. GCS motor subscore is 6.  Skin: Skin is warm, dry and intact. No rash noted. No cyanosis.  Psychiatric: She has a normal mood and affect. Her speech is normal and behavior is normal. Thought content normal.  Nursing note and vitals reviewed.    ED Treatments / Results  Labs (all labs ordered  are listed, but only abnormal results are displayed) Labs Reviewed  CBC WITH DIFFERENTIAL/PLATELET - Abnormal; Notable for the following components:      Result Value   WBC 19.8 (*)    RBC 5.37 (*)    Hemoglobin 15.4 (*)    Neutro Abs 16.8 (*)    Monocytes Absolute 1.4 (*)    All other components within normal limits  COMPREHENSIVE METABOLIC PANEL - Abnormal; Notable for the following components:   Sodium 133 (*)    Potassium 3.2 (*)    Chloride 99 (*)    Albumin 3.3 (*)    All other components within normal limits  URINALYSIS, ROUTINE W REFLEX MICROSCOPIC  I-STAT CG4  LACTIC ACID, ED    EKG  EKG Interpretation None       Radiology Ct Abdomen Pelvis W Contrast  Result Date: 12/04/2016 CLINICAL DATA:  Abdominal pain. EXAM: CT ABDOMEN AND PELVIS WITH CONTRAST TECHNIQUE: Multidetector CT imaging of the abdomen and pelvis was performed using the standard protocol following bolus administration of intravenous contrast. CONTRAST:  142mL ISOVUE-300 IOPAMIDOL (ISOVUE-300) INJECTION 61% COMPARISON:  Right upper quadrant ultrasound earlier this day. FINDINGS: Lower chest: Dependent atelectasis in both lung bases, right greater than left. Hepatobiliary: Subcentimeter hypodensity in the right lobe is too small to accurately characterize but likely small cyst. Gallbladder physiologically distended, no calcified stone. No biliary dilatation. Pancreas: No ductal dilatation or inflammation. Spleen: Normal in size without focal abnormality. Adrenals/Urinary Tract: Normal adrenal glands. No hydronephrosis or perinephric edema. Subcentimeter hypodensity in the mid lower left kidney is too small to characterize but likely small cyst. Excreted IV contrast within both ureters and bladder. Urinary bladder is physiologically distended. Stomach/Bowel: Stomach physiologically distended. No bowel inflammation, wall thickening or obstruction. Normal appendix. Moderate colonic stool burden without colonic wall  thickening or inflammation. Vascular/Lymphatic: No significant vascular findings are present. Few prominent left inguinal and retroperitoneal nodes, not enlarged by size criteria. Reproductive: Uterus and bilateral adnexa are unremarkable. Other: No free air, free fluid, or intra-abdominal fluid collection. Tiny fat containing umbilical hernia. Musculoskeletal: There are no acute or suspicious osseous abnormalities. IMPRESSION: No acute abnormality or explanation for abdominal pain. Electronically Signed   By: Jeb Levering M.D.   On: 12/04/2016 05:31   US Abdomen Limited  Result Date: 12/04/2016 CLINICAL DATA:  Right upper quadrant pain. EXAM: ULTRASOUND ABDOMEN LIMITED RIGHT UPPER QUADRANT COMPARISON:  None. FINDINGS: Gallbladder: Partially distended. No gallstones or wall thickening visualized. No sonographic Murphy sign noted by sonographer. Common bile duct: Diameter: 3 mm, normal. Liver: No focal lesion identified. Within normal limits in parenchymal echogenicity. Portal vein is patent on color Doppler imaging with normal direction of blood flow towards the liver. IMPRESSION: Unremarkable right upper quadrant ultrasound. Electronically Signed   By: Jeb Levering M.D.   On: 12/04/2016 04:12    Procedures Procedures (including critical care time)  Medications Ordered in ED Medications  cefTRIAXone (ROCEPHIN) 1 g in dextrose 5 % 50 mL IVPB (1 g Intravenous New Bag/Given 12/05/16 0733)  HYDROmorphone (DILAUDID) injection 1 mg (1 mg Intravenous Given 12/05/16 0606)  ondansetron (ZOFRAN) injection 4 mg (4 mg Intravenous Given 12/05/16 0605)  sodium chloride 0.9 % bolus 1,000 mL (0 mLs Intravenous Stopped 12/05/16 0710)  sodium chloride 0.9 % bolus 1,000 mL (1,000 mLs Intravenous New Bag/Given 12/05/16 0733)     Initial Impression / Assessment and Plan / ED Course  I have reviewed the triage vital signs and the nursing notes.  Pertinent labs & imaging results that were available during my care  of the patient were reviewed by me and considered in my medical decision making (see chart for details).     Patient presents with persistent abdominal pain with nausea and vomiting.  She is not experiencing any flank pain, no CVA tenderness.  Patient reports diffuse abdominal pain and tenderness.  Exam did not reveal any signs of acute surgical process.  Reviewing her records from yesterday reveals that she had a thorough workup including CT abdomen and pelvis.  No acute abnormalities were noted.  Repeat labs today do show elevated white blood cell count.  Patient treated with IV hydration, analgesia.  She does not appear septic.  She is afebrile.  Vital signs are normal, no tachycardia or hypotension.  Patient can continue treatment for UTI as an outpatient, analgesia and antiemetics.  Patient did tell me that she was given an antibiotic by her doctor for a boil.  She had a lesion on her left labia that has drained.  I did visualize this area and there is no residual induration, fluctuance or anything to drain.  She was prescribed Augmentin.  I recommended she stop the Augmentin, use the Keflex that was prescribed yesterday for UTI.  She can do warm soaks/sitz baths.  Final Clinical Impressions(s) / ED Diagnoses   Final diagnoses:  Generalized abdominal pain  Non-intractable vomiting with nausea, unspecified vomiting type  Urinary tract infection without hematuria, site unspecified  Abscess    ED Discharge Orders        Ordered    HYDROcodone-acetaminophen (NORCO/VICODIN) 5-325 MG tablet  Every 4 hours PRN     12/05/16 0747    ondansetron (ZOFRAN) 4 MG tablet  Every 6 hours     12/05/16 0747       Orpah Greek, MD 12/05/16 [id number]    Orpah Greek, MD 12/05/16 [phone]

## 2016-12-06 ENCOUNTER — Encounter (HOSPITAL_COMMUNITY): Payer: Self-pay

## 2016-12-06 ENCOUNTER — Emergency Department (HOSPITAL_COMMUNITY)
Admission: EM | Admit: 2016-12-06 | Discharge: 2016-12-06 | Disposition: A | Discharge status: Home / Self Care | Payer: Medicaid Other | Attending: Emergency Medicine | Admitting: Emergency Medicine

## 2016-12-06 ENCOUNTER — Other Ambulatory Visit: Payer: Self-pay | Admitting: Obstetrics

## 2016-12-06 ENCOUNTER — Other Ambulatory Visit: Payer: Self-pay

## 2016-12-06 DIAGNOSIS — R109 Unspecified abdominal pain: Secondary | ICD-10-CM | POA: Diagnosis not present

## 2016-12-06 DIAGNOSIS — J45909 Unspecified asthma, uncomplicated: Secondary | ICD-10-CM | POA: Insufficient documentation

## 2016-12-06 DIAGNOSIS — E876 Hypokalemia: Secondary | ICD-10-CM | POA: Diagnosis not present

## 2016-12-06 DIAGNOSIS — F17210 Nicotine dependence, cigarettes, uncomplicated: Secondary | ICD-10-CM | POA: Diagnosis not present

## 2016-12-06 DIAGNOSIS — R1084 Generalized abdominal pain: Secondary | ICD-10-CM

## 2016-12-06 LAB — CBC
HCT: 42.2 % (ref 36.0–46.0)
Hemoglobin: 14.7 g/dL (ref 12.0–15.0)
MCH: 29.1 pg (ref 26.0–34.0)
MCHC: 34.8 g/dL (ref 30.0–36.0)
MCV: 83.4 fL (ref 78.0–100.0)
PLATELETS: 326 10*3/uL (ref 150–400)
RBC: 5.06 MIL/uL (ref 3.87–5.11)
RDW: 14.2 % (ref 11.5–15.5)
WBC: 12 10*3/uL — ABNORMAL HIGH (ref 4.0–10.5)

## 2016-12-06 LAB — COMPREHENSIVE METABOLIC PANEL
ALBUMIN: 3.5 g/dL (ref 3.5–5.0)
ALK PHOS: 102 U/L (ref 38–126)
ALT: 18 U/L (ref 14–54)
ANION GAP: 11 (ref 5–15)
AST: 19 U/L (ref 15–41)
BILIRUBIN TOTAL: 0.5 mg/dL (ref 0.3–1.2)
BUN: 5 mg/dL — AB (ref 6–20)
CALCIUM: 8.9 mg/dL (ref 8.9–10.3)
CO2: 25 mmol/L (ref 22–32)
Chloride: 101 mmol/L (ref 101–111)
Creatinine, Ser: 0.64 mg/dL (ref 0.44–1.00)
GFR calc Af Amer: >60 mL/min (ref >60)
GFR calc non Af Amer: >60 mL/min (ref >60)
GLUCOSE: 91 mg/dL (ref 65–99)
Potassium: 3.2 mmol/L — ABNORMAL LOW (ref 3.5–5.1)
SODIUM: 137 mmol/L (ref 135–145)
TOTAL PROTEIN: 8 g/dL (ref 6.5–8.1)

## 2016-12-06 LAB — I-STAT BETA HCG BLOOD, ED (MC, WL, AP ONLY)

## 2016-12-06 LAB — LIPASE, BLOOD: Lipase: 23 U/L (ref 11–51)

## 2016-12-06 MED ORDER — POTASSIUM CHLORIDE CRYS ER 20 MEQ PO TBCR
40.0000 meq | EXTENDED_RELEASE_TABLET | Freq: Once | ORAL | Status: AC
  Administered 2016-12-06: 40 meq via ORAL
  Filled 2016-12-06: qty 2

## 2016-12-06 NOTE — ED Provider Notes (Signed)
[facility] DEPT Provider Note   CSN: [id number] Arrival date & time: 12/06/16  4132     History   Chief Complaint Chief Complaint  Patient presents with  . Abdominal Pain    HPI Catherine Osborne is a 41 y.o. female.  Patient returns to the emergency room for ongoing abdominal pain for the past 5 days. She was seen and evaluated by her doctor 6 days ago for scheduled visit for vaginal discharge. She was seen for generalized abdominal pain, nausea and vomiting on November 4th and 5th and returns today for persistent symptoms. She was diagnosed with UTI in the emergency department and given a Rx for Keflex.  In the interim, tests for STD done by her doctor, specifically GC and trichomonas, were resulted and she was given Suprax, azithromycin, and Flagyl. She reports her pharmacist would not fill all of her prescriptions today so she returned here. She states she has been unable to eat secondary to pain. No further vomiting. No fever. Pain has not localized and is still in a general distribution.    The history is provided by the patient. No language interpreter was used.  Abdominal Pain   This is a new problem. The current episode started more than 2 days ago. The problem occurs constantly. The problem has not changed since onset.Associated symptoms include nausea and constipation. Pertinent negatives include fever, vomiting, dysuria and myalgias.    Past Medical History:  Diagnosis Date  . Allergy   . Asthma   . Headache   . 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 Pneumonia   . Prior miscarriage with pregnancy in first trimester, antepartum   . Vaginal Pap smear, abnormal     Patient Active Problem List   Diagnosis Date Noted  . Seasonal allergies 01/18/2016  . Cigarette smoker 01/18/2016  . Compliance poor 01/18/2016  . Asthma 12/07/2015  . Goiter diffuse 12/07/2015  . NSVD (normal spontaneous vaginal delivery) 06/13/2015  . Pregnancy induced  hypertension 06/12/2015  . Preeclampsia 06/10/2015  . Short cervix affecting pregnancy 02/25/2015  . Advanced maternal age in multigravida 02/12/2015  . Left thyroid nodule 10/25/2011    Past Surgical History:  Procedure Laterality Date  . DILATION AND CURETTAGE OF UTERUS    . FOOT SURGERY      OB History    Gravida Para Term Preterm AB Living   14 2 1 1 12 1    SAB TAB Ectopic Multiple Live Births   10 2   0 1       Home Medications    Prior to Admission medications   Medication Sig Start Date End Date Taking? Authorizing Provider  albuterol (PROVENTIL HFA;VENTOLIN HFA) 108 (90 Base) MCG/ACT inhaler Inhale 1-2 puffs into the lungs every 6 (six) hours as needed for wheezing or shortness of breath. 07/19/16   Noralee Space, MD  albuterol (PROVENTIL) (2.5 MG/3ML) 0.083% nebulizer solution Take 3 mLs (2.5 mg total) by nebulization every 6 (six) hours as needed for wheezing or shortness of breath. 10/08/15   Noe Gens, PA-C  beclomethasone (QVAR REDIHALER) 80 MCG/ACT inhaler Inhale 2 puffs into the lungs 2 (two) times daily. 08/05/16   Noralee Space, MD  beclomethasone (QVAR REDIHALER) 80 MCG/ACT inhaler Inhale 2 puffs into the lungs 2 (two) times daily. Patient not taking: Reported on 12/04/2016 08/05/16   Noralee Space, MD  cephALEXin (KEFLEX) 250 MG capsule Take 1 capsule (250 mg total) 4 (four) times daily by  mouth. 12/04/16   Montine Circle, PA-C  HYDROcodone-acetaminophen (NORCO/VICODIN) 5-325 MG tablet Take 1 tablet every 4 (four) hours as needed by mouth for moderate pain. 12/05/16   Orpah Greek, MD  ondansetron (ZOFRAN) 4 MG tablet Take 1 tablet (4 mg total) every 6 (six) hours by mouth. 12/05/16   Orpah Greek, MD  valACYclovir (VALTREX) 500 MG tablet Take 1 tablet (500 mg total) by mouth 2 (two) times daily. Take for 3 days prn. Patient not taking: Reported on 12/04/2016 11/30/15   Shelly Bombard, MD    Family History Family History  Adopted: Yes    Problem Relation Age of Onset  . Bone cancer Mother   . Breast cancer Maternal Grandmother   . Diabetes Unknown   . Hypertension Unknown   . Stroke Unknown   . Heart attack Unknown     Social History Social History   Tobacco Use  . Smoking status: Current Some Day Smoker    Packs/day: 1.00    Years: 20.00    Pack years: 20.00    Types: Cigarettes    Last attempt to quit: 01/05/2014    Years since quitting: 2.9  . Smokeless tobacco: Never Used  . Tobacco comment: marijuana/30 mins ago  Substance Use Topics  . Alcohol use: Yes    Alcohol/week: 0.6 oz    Types: 1 Glasses of wine per week    Comment: rare  . Drug use: Yes    Types: Marijuana    Comment: occasional use     Allergies   Patient has no known allergies.   Review of Systems Review of Systems  Constitutional: Negative for chills and fever.  Respiratory: Negative.  Negative for cough and shortness of breath.   Cardiovascular: Negative.  Negative for chest pain.  Gastrointestinal: Positive for abdominal pain, constipation and nausea. Negative for vomiting.  Genitourinary: Negative.  Negative for dysuria and vaginal discharge.  Musculoskeletal: Negative.  Negative for myalgias.  Skin: Negative.   Neurological: Negative.      Physical Exam Updated Vital Signs BP (!) 150/98 (BP Location: Left Arm)   Pulse 96   Temp 99.3 F (37.4 C) (Oral)   Resp (!) 24   Ht 5\' 2"  (1.575 m)   Wt 85.7 kg (189 lb)   LMP 12/05/2016 (Exact Date)   SpO2 98%   BMI 34.57 kg/m   Physical Exam  Constitutional: She is oriented to person, place, and time. She appears well-developed and well-nourished.  Non-toxic appearance. No distress.  HENT:  Head: Normocephalic.  Neck: Normal range of motion. Neck supple.  Cardiovascular: Normal rate and regular rhythm.  Pulmonary/Chest: Effort normal and breath sounds normal.  Abdominal: Soft. Bowel sounds are normal. There is generalized tenderness. There is no rebound and no  guarding.  Musculoskeletal: Normal range of motion.  Neurological: She is alert and oriented to person, place, and time. No cranial nerve deficit.  Skin: Skin is warm and dry. No rash noted.  Psychiatric: She has a normal mood and affect.     ED Treatments / Results  Labs (all labs ordered are listed, but only abnormal results are displayed) Labs Reviewed  COMPREHENSIVE METABOLIC PANEL - Abnormal; Notable for the following components:      Result Value   Potassium 3.2 (*)    BUN 5 (*)    All other components within normal limits  CBC - Abnormal; Notable for the following components:   WBC 12.0 (*)    All other components  within normal limits  LIPASE, BLOOD  URINALYSIS, ROUTINE W REFLEX MICROSCOPIC  I-STAT BETA HCG BLOOD, ED ([facility], [facility], [facility] ONLY)    EKG  EKG Interpretation None       Radiology No results found.  Procedures Procedures (including critical care time)  Medications Ordered in ED Medications - No data to display   Initial Impression / Assessment and Plan / ED Course  I have reviewed the triage vital signs and the nursing notes.  Pertinent labs & imaging results that were available during my care of the patient were reviewed by me and considered in my medical decision making (see chart for details).     Patient returns to ED for further evaluation of abdominal pain. Chart reviewed. RUQ abdominal US and CT abd/pel done on Nov 4th were both negative. Do not feel repeat studies are warranted. VSS.  Lab tests improving today. The patient states she attempted to fill her Rx's today but has been taking her pain medication, both new one given on Nov 4 (Norco) and Percocet from previous evaluation, and states these help her pain but the Norco does not. Discussed from our standpoint her pain has been adequately addressed and evaluated. She has appropriate prescriptions for diagnoses and should continue these. Her Keflex was not filled by pharmacy but her Suprax was. She  was encouraged to continue these until Rx completed. She was also encouraged to follow up with her doctor for further evaluation if pain continues.  She states she is having fewer bowel movements. Discussed use of Miralax to see if this improves her pain.   Final Clinical Impressions(s) / ED Diagnoses   Final diagnoses:  None   1. Persistent abdominal pain 2. Mild hypokalemia (3.2)  ED Discharge Orders    None       Charlann Lange, PA-C 12/06/16 2322    Tegeler, Gwenyth Allegra, MD 12/07/16 0130

## 2016-12-06 NOTE — Discharge Instructions (Signed)
Take Miralax (over-the-counter), one dose 3 times daily until bowels move well. Take this dose for no more than 3 consecutive days. Follow up with your doctor for further evaluation of persistent abdominal pain.

## 2016-12-06 NOTE — ED Triage Notes (Addendum)
Pt reports that she is experiencing generalized abdominal pain and nausea that started last Friday. She states, "I have 2 major STDs." No vomiting with EMS. Seen at [facility] yesterday for same. A&Ox4. Ambulatory. She reports that she did not take the medication that they gave her because, "her pharmacist said that it was too many medications to take together."

## 2016-12-07 ENCOUNTER — Other Ambulatory Visit: Payer: Self-pay | Admitting: Obstetrics

## 2016-12-07 DIAGNOSIS — N730 Acute parametritis and pelvic cellulitis: Secondary | ICD-10-CM

## 2016-12-07 MED ORDER — METRONIDAZOLE 500 MG PO TABS: 500.0000 mg | ORAL_TABLET | Freq: Two times a day (BID) | ORAL | 0 refills | Status: DC

## 2016-12-07 MED ORDER — DOXYCYCLINE HYCLATE 100 MG PO CAPS: 100.0000 mg | ORAL_CAPSULE | Freq: Two times a day (BID) | ORAL | 0 refills | Status: DC

## 2016-12-14 ENCOUNTER — Telehealth: Payer: Self-pay

## 2016-12-14 NOTE — Telephone Encounter (Signed)
Returned call and pt had questions about her medication indications, advised that she needs to schedule follow up appt for TOC, transferred to scheduler.

## 2016-12-15 ENCOUNTER — Telehealth: Payer: Self-pay

## 2016-12-15 NOTE — Telephone Encounter (Signed)
Pt called in stating that she threw up antibiotics that provider prescribed from last office visit. Advised pt that I would send provider message to contact her to see how to proceed.

## 2016-12-15 NOTE — Telephone Encounter (Signed)
Returned call, no answer, left vm 

## 2017-01-09 ENCOUNTER — Ambulatory Visit: Payer: Medicaid Other | Admitting: Obstetrics

## 2017-01-18 ENCOUNTER — Ambulatory Visit: Payer: Medicaid Other | Admitting: Pulmonary Disease

## 2017-01-26 ENCOUNTER — Ambulatory Visit (INDEPENDENT_AMBULATORY_CARE_PROVIDER_SITE_OTHER): Payer: Medicaid Other | Admitting: Obstetrics

## 2017-01-26 ENCOUNTER — Other Ambulatory Visit (HOSPITAL_COMMUNITY)
Admission: RE | Admit: 2017-01-26 | Discharge: 2017-01-26 | Disposition: A | Discharge status: Home / Self Care | Payer: Medicaid Other | Source: Ambulatory Visit | Attending: Obstetrics | Admitting: Obstetrics

## 2017-01-26 ENCOUNTER — Encounter: Payer: Self-pay | Admitting: Obstetrics

## 2017-01-26 VITALS — BP 106/76 | HR 82 | Wt 178.0 lb

## 2017-01-26 DIAGNOSIS — B9689 Other specified bacterial agents as the cause of diseases classified elsewhere: Secondary | ICD-10-CM | POA: Diagnosis not present

## 2017-01-26 DIAGNOSIS — Z8619 Personal history of other infectious and parasitic diseases: Secondary | ICD-10-CM | POA: Diagnosis not present

## 2017-01-26 DIAGNOSIS — Z113 Encounter for screening for infections with a predominantly sexual mode of transmission: Secondary | ICD-10-CM | POA: Diagnosis not present

## 2017-01-26 NOTE — Progress Notes (Signed)
[name], female   DOB: 1975-03-11, 41 y.o.   MRN: [id number]  Chief Complaint  Patient presents with  . Follow-up    HPI Catherine Osborne is a 41 y.o. female.  History of + GC in October 2018, treated.  Presents today for TOC cultures.  No complaints. HPI  Past Medical History:  Diagnosis Date  . Allergy   . Asthma   . Headache   . 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 Pneumonia   . Prior miscarriage with pregnancy in first trimester, antepartum   . Vaginal Pap smear, abnormal     Past Surgical History:  Procedure Laterality Date  . CERVICAL CERCLAGE N/A 02/26/2015   Procedure: CERCLAGE CERVICAL;  Surgeon: Frederico Hamman, MD;  Location: [facility];  Service: Gynecology;  Laterality: N/A;  . DILATION AND CURETTAGE OF UTERUS    . DILATION AND EVACUATION N/A 05/22/2012   Procedure: DILATATION AND EVACUATION;  Surgeon: Frederico Hamman, MD;  Location: [facility];  Service: Gynecology;  Laterality: N/A;  . FOOT SURGERY      Family History  Adopted: Yes  Problem Relation Age of Onset  . Bone cancer Mother   . Breast cancer Maternal Grandmother   . Diabetes Unknown   . Hypertension Unknown   . Stroke Unknown   . Heart attack Unknown     Social History Social History   Tobacco Use  . Smoking status: Current Some Day Smoker    Packs/day: 1.00    Years: 20.00    Pack years: 20.00    Types: Cigarettes    Last attempt to quit: 01/05/2014    Years since quitting: 3.0  . Smokeless tobacco: Never Used  . Tobacco comment: marijuana/30 mins ago  Substance Use Topics  . Alcohol use: Yes    Alcohol/week: 0.6 oz    Types: 1 Glasses of wine per week    Comment: rare  . Drug use: Yes    Types: Marijuana    Comment: occasional use    No Known Allergies  Current Outpatient Medications  Medication Sig Dispense Refill  . albuterol (PROVENTIL HFA;VENTOLIN HFA) 108 (90 Base) MCG/ACT inhaler Inhale 1-2 puffs into the lungs every 6 (six) hours as needed for wheezing  or shortness of breath. 1 Inhaler 6  . albuterol (PROVENTIL) (2.5 MG/3ML) 0.083% nebulizer solution Take 3 mLs (2.5 mg total) by nebulization every 6 (six) hours as needed for wheezing or shortness of breath. 75 mL 12  . beclomethasone (QVAR REDIHALER) 80 MCG/ACT inhaler Inhale 2 puffs into the lungs 2 (two) times daily. 10.6 g 11  . cephALEXin (KEFLEX) 250 MG capsule Take 1 capsule (250 mg total) 4 (four) times daily by mouth. 28 capsule 0  . doxycycline (VIBRAMYCIN) 100 MG capsule Take 1 capsule (100 mg total) 2 (two) times daily by mouth. 28 capsule 0  . HYDROcodone-acetaminophen (NORCO/VICODIN) 5-325 MG tablet Take 1 tablet every 4 (four) hours as needed by mouth for moderate pain. 10 tablet 0  . metroNIDAZOLE (FLAGYL) 500 MG tablet Take 1 tablet (500 mg total) 2 (two) times daily by mouth. 28 tablet 0  . ondansetron (ZOFRAN) 4 MG tablet Take 1 tablet (4 mg total) every 6 (six) hours by mouth. 12 tablet 0   Current Facility-Administered Medications  Medication Dose Route Frequency Provider Last Rate Last Dose  . 0.9 %  sodium chloride infusion  500 mL Intravenous Continuous Nandigam, Venia Minks, MD      . cefTRIAXone (  ROCEPHIN) injection 250 mg  250 mg Intramuscular Once Shelly Bombard, MD        Review of Systems Review of Systems Constitutional: negative for fatigue and weight loss Respiratory: negative for cough and wheezing Cardiovascular: negative for chest pain, fatigue and palpitations Gastrointestinal: negative for abdominal pain and change in bowel habits Genitourinary:negative Integument/breast: negative for nipple discharge Musculoskeletal:negative for myalgias Neurological: negative for gait problems and tremors Behavioral/Psych: negative for abusive relationship, depression Endocrine: negative for temperature intolerance      Blood pressure 106/76, pulse 82, weight 178 lb (80.7 kg), last menstrual period 01/25/2017, not currently breastfeeding.  Physical  Exam Physical Exam           General:  Alert and no distress Abdomen:  normal findings: no organomegaly, soft, non-tender and no hernia  Pelvis:  External genitalia: normal general appearance Urinary system: urethral meatus normal and bladder without fullness, nontender Vaginal: normal without tenderness, induration or masses Cervix: normal appearance Adnexa: normal bimanual exam Uterus: anteverted and non-tender, normal size    50% of 15 min visit spent on counseling and coordination of care.   Data Reviewed Wet Prep Cultures  Assessment     1. History of gonorrhea Rx: - Cervicovaginal ancillary only    Plan    Follow up in 11 months for Annual  No orders of the defined types were placed in this encounter.  No orders of the defined types were placed in this encounter.

## 2017-01-26 NOTE — Progress Notes (Signed)
RGYN patient presents for TOC today  + GC 10/31//2018.  Pt has no concerns  today

## 2017-01-27 ENCOUNTER — Other Ambulatory Visit: Payer: Self-pay | Admitting: Obstetrics

## 2017-01-27 DIAGNOSIS — N760 Acute vaginitis: Principal | ICD-10-CM

## 2017-01-27 DIAGNOSIS — B9689 Other specified bacterial agents as the cause of diseases classified elsewhere: Secondary | ICD-10-CM

## 2017-01-27 LAB — CERVICOVAGINAL ANCILLARY ONLY
Bacterial vaginitis: POSITIVE — AB
CANDIDA VAGINITIS: NEGATIVE
CHLAMYDIA, DNA PROBE: NEGATIVE
NEISSERIA GONORRHEA: NEGATIVE
TRICH (WINDOWPATH): NEGATIVE

## 2017-01-27 MED ORDER — SECNIDAZOLE 2 G PO PACK: 1.0000 | PACK | Freq: Once | ORAL | 2 refills | Status: AC

## 2017-01-31 IMAGING — US US MFM OB LIMITED
1 series · 13 of 13 positions shown · non-contrast
Comparison: none

[Series 1: us mfm ob limited · 13 acquisitions, 13 frames shown]
[im 1/13]
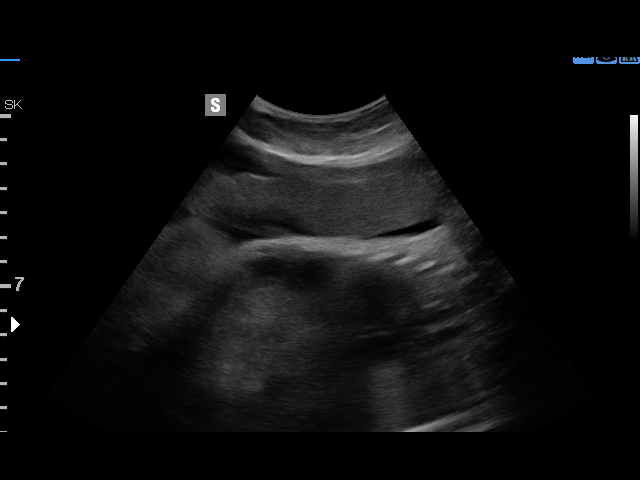
[im 2/13]
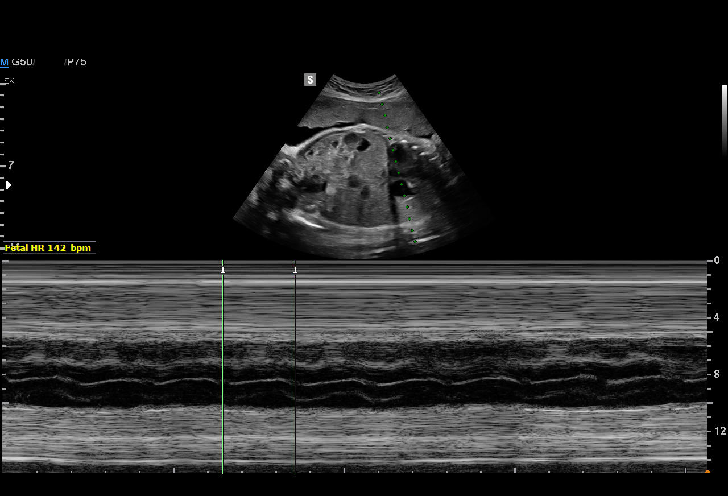
[im 3/13]
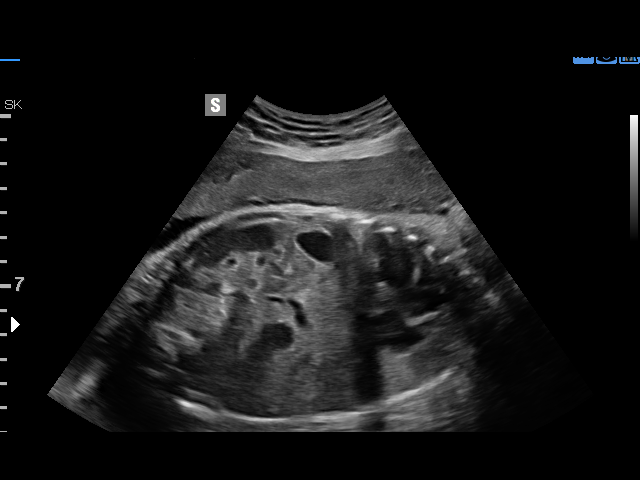
[im 4/13]
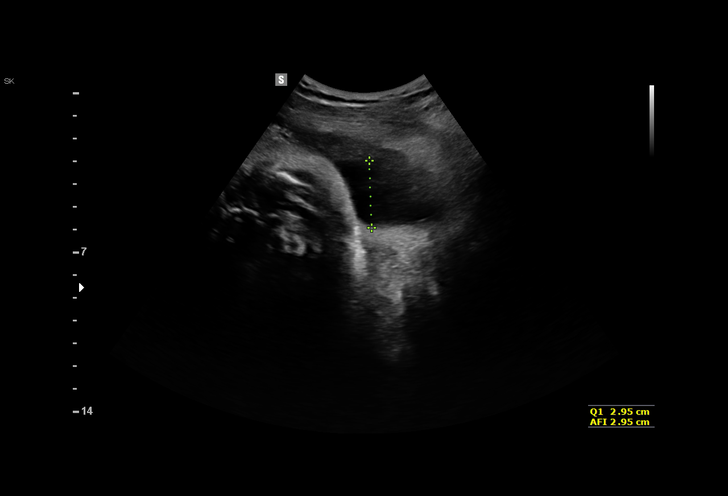
[im 5/13]
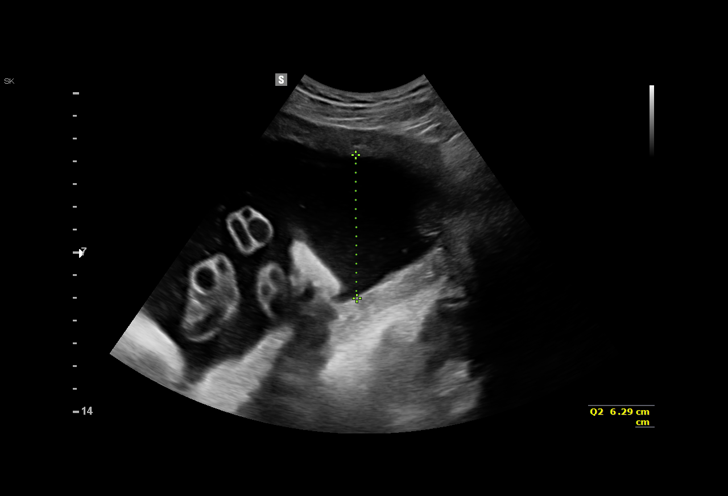
[im 6/13]
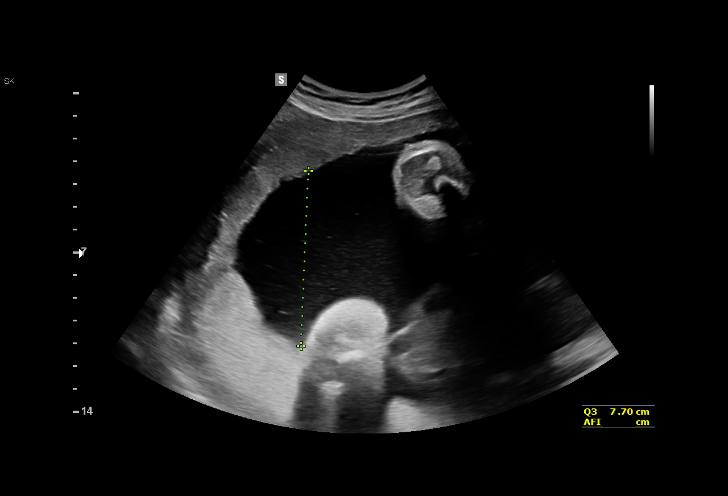
[im 7/13]
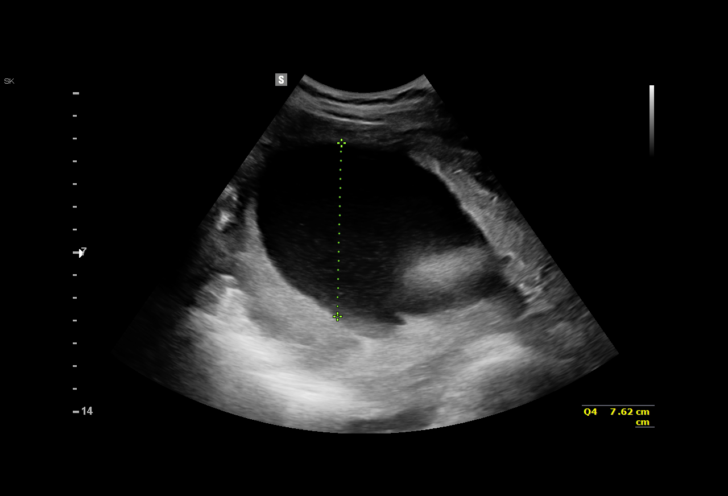
[im 8/13]
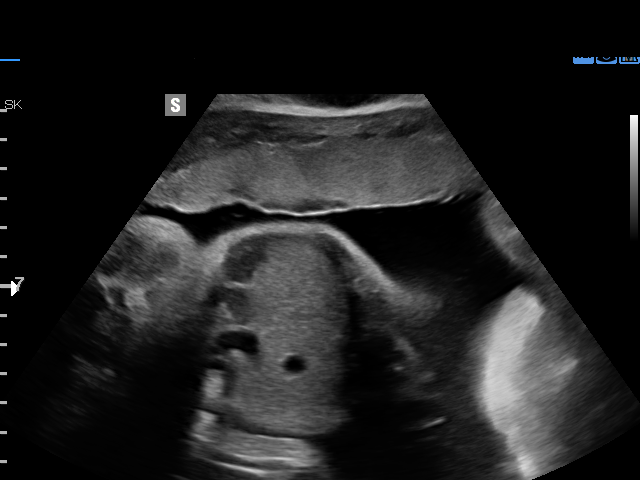
[im 9/13]
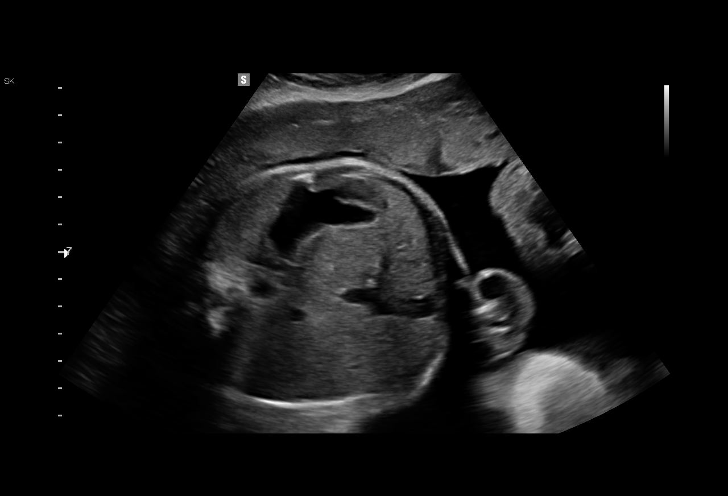
[im 10/13]
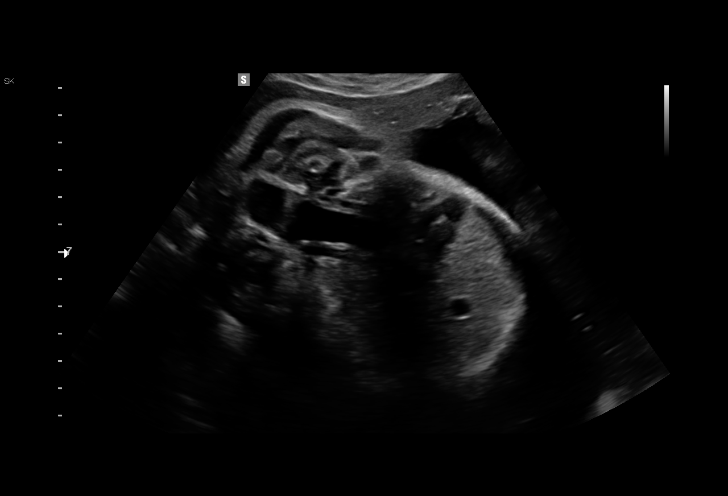
[im 11/13]
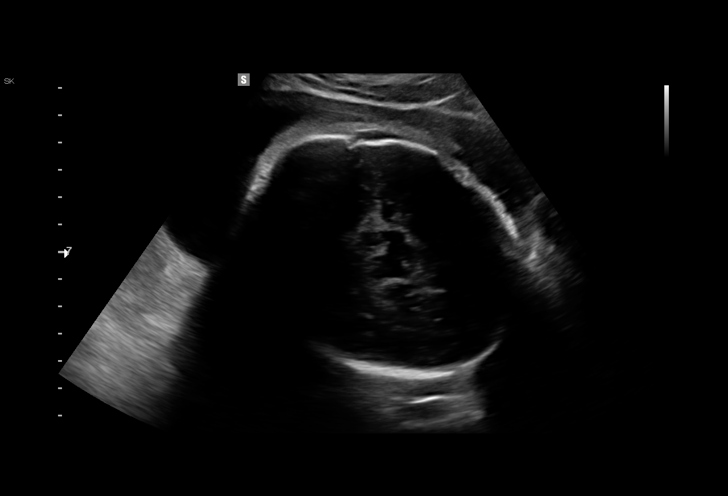
[im 12/13]
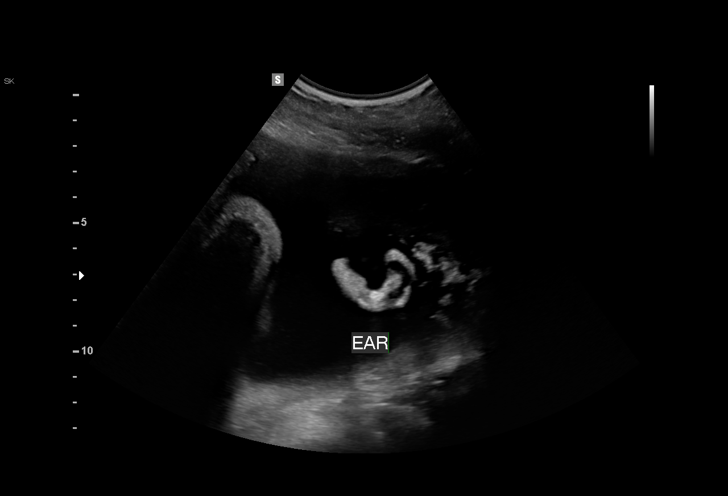
[im 13/13]
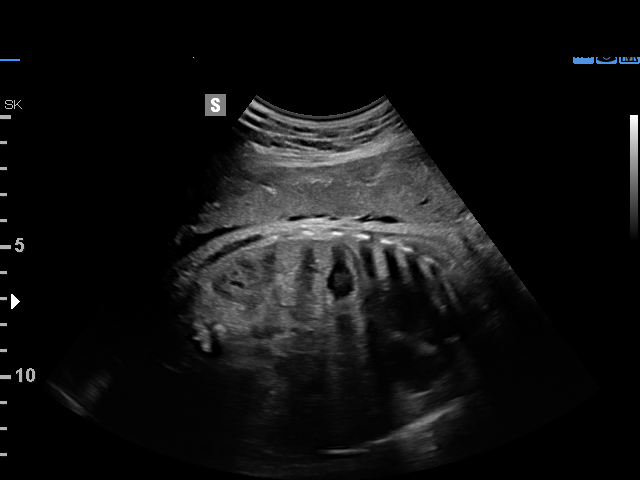

[13 of 13 positions shown; findings below may reference images not displayed]

Indications

Cervical cerclage suture present, third
trimester (Placed 02-26-15)
Cervical shortening, third trimester
Advanced maternal age multigravida 35+,
third trimester (Low risk NIPS)
Asthma                                         6II.EI [id number]
Drug use complicating pregnancy, third
trimester (THC)
Poor obstetric history: Previous preterm
delivery (36 weeks)
Velamentous insertion of umbilical cord
34 weeks gestation of pregnancy
OB History

Gravidity:    14        Term:   0        Prem:   1        SAB:   10
TOP:          2       Ectopic:  0        Living: 1
Fetal Evaluation

Num Of Fetuses:     1
Fetal Heart         142
Rate(bpm):
Cardiac Activity:   Observed
Presentation:       Cephalic
Placenta:           Bilobed - ant/post
P. Cord Insertion:  Velamentous insertion

Amniotic Fluid
AFI FV:      Subjectively upper-normal
AFI Sum:     24.56   cm      94   %Tile     Larg Pckt:     7.7  cm
RUQ:   2.95   cm    RLQ:    7.62   cm    LUQ:   6.29    cm   LLQ:    7.7    cm
Biophysical Evaluation

Amniotic F.V:   Within normal limits       F. Tone:        Observed
F. Movement:    Observed                   Score:          [DATE]
F. Breathing:   Observed
Gestational Age

Best:          34w 1d    Det. By:   Early Ultrasound         EDD:   06/30/15
(11/05/14)
Anatomy

Stomach:          Appears normal, left   Bladder:          Appears normal
sided
Cervix Uterus Adnexa

Cervix
Not visualized (advanced GA >10wks)
Impression

Single IUP at 34w 1d
A velamentous placental cord insertion is noted (fundal)
Mild polyhydramnios (AFI 24.6 cm)
BPP [DATE]
Recommendations

Continue weekly BPPs/ AFIs
Serial ultrasounds for growth
Delivery at 39 weeks if polyhydramnios is persistent

## 2017-02-02 ENCOUNTER — Ambulatory Visit: Payer: Medicaid Other | Admitting: Pulmonary Disease

## 2017-02-02 ENCOUNTER — Telehealth: Payer: Self-pay

## 2017-02-02 NOTE — Telephone Encounter (Signed)
Attempted to contact about results and rx sent, left vm to call.

## 2017-02-02 NOTE — Telephone Encounter (Signed)
Pt called, advised of results and rx

## 2017-02-15 ENCOUNTER — Ambulatory Visit (INDEPENDENT_AMBULATORY_CARE_PROVIDER_SITE_OTHER): Payer: Self-pay | Admitting: Pulmonary Disease

## 2017-02-15 ENCOUNTER — Encounter: Payer: Self-pay | Admitting: Pulmonary Disease

## 2017-02-15 VITALS — BP 118/82 | HR 125 | Temp 99.2°F | Ht 62.0 in | Wt 168.0 lb

## 2017-02-15 DIAGNOSIS — J209 Acute bronchitis, unspecified: Secondary | ICD-10-CM

## 2017-02-15 DIAGNOSIS — Z91199 Patient's noncompliance with other medical treatment and regimen due to unspecified reason: Secondary | ICD-10-CM

## 2017-02-15 DIAGNOSIS — F17210 Nicotine dependence, cigarettes, uncomplicated: Secondary | ICD-10-CM

## 2017-02-15 DIAGNOSIS — J45909 Unspecified asthma, uncomplicated: Secondary | ICD-10-CM

## 2017-02-15 DIAGNOSIS — Z9119 Patient's noncompliance with other medical treatment and regimen: Secondary | ICD-10-CM

## 2017-02-15 DIAGNOSIS — J4520 Mild intermittent asthma, uncomplicated: Secondary | ICD-10-CM

## 2017-02-15 DIAGNOSIS — J302 Other seasonal allergic rhinitis: Secondary | ICD-10-CM

## 2017-02-15 MED ORDER — BECLOMETHASONE DIPROP HFA 80 MCG/ACT IN AERB: 2.0000 | INHALATION_SPRAY | Freq: Two times a day (BID) | RESPIRATORY_TRACT | 2 refills | Status: DC

## 2017-02-15 MED ORDER — ALBUTEROL SULFATE HFA 108 (90 BASE) MCG/ACT IN AERS: 1.0000 | INHALATION_SPRAY | Freq: Four times a day (QID) | RESPIRATORY_TRACT | 6 refills | Status: DC | PRN

## 2017-02-15 MED ORDER — METHYLPREDNISOLONE ACETATE 80 MG/ML IJ SUSP
80.0000 mg | Freq: Once | INTRAMUSCULAR | Status: AC
  Administered 2017-02-15: 80 mg via INTRAMUSCULAR

## 2017-02-15 MED ORDER — AZITHROMYCIN 250 MG PO TABS: ORAL_TABLET | ORAL | 0 refills | Status: DC

## 2017-02-15 MED ORDER — PREDNISONE 20 MG PO TABS: ORAL_TABLET | ORAL | 0 refills | Status: DC

## 2017-02-15 NOTE — Progress Notes (Signed)
Subjective:     [name], female   DOB: 1975-06-09, 42 y.o.   MRN: [id number]  HPI  ~  December 07, 2015:  Initial Pulmonary Consult by SN>      59 y/o BF referred by DrPavelock at the [facility] for a pulmonary evaluation due to dyspnea>     Pt states that she has had ASTHMA since birth; she remembers being treated w/ Pred & Albuterol as a child, similar intermittent therapy as a teen, and Pred, Qvar, Proair as an adult; she notes asthma attacks w/ wheezing, cough, SOB esp after an upper resp infection, but also w/ exposure to allergens, & change in the weather;  She smokes MJ but not cigarettes; States that at age 71 she found out she was allergic to Christmas trees;  She had prev allergy eval by DrSharma ~34yrs ago by her hx & was on allergy shots regularly until her last preg in 2016 when shots were held & never restarted (we do not have records from [facility] & have called for these)...     She has gone to the ER/ UC on several occas- most recently 11/2-3/17 & 10/08/15> records reviewed-- no wheezing heard, CXR was clear, treated w/ Duoneb/ Depo/ Pred...     Med list includes Qvar80-2spBid & ProairHFA prn- she is not using the Qvar regularly... EXAM shows Afeb, VSS, O2sat=98% on RA;  HEENT- neg, mallampati2, palp thyroid;  Chest- clear w/o w/r/r;  Heart- RR w/o m/r/g heard;  Abd- soft, nontender, neg;  Ext- neg w/o c/c/e;  Neuro- intact...  CXR 12/03/15 at [facility]>  Norm heart size, clear lungs, NAD...  Spirometry here 12/07/15>  FVC=2.91 (101%), FEV1=1.92 (80%), %1sec=66%, mid-flows are reduced at 52% predicted.  Ambulatory oximetry on RA>  O2sat=97% at rest w/ pulse=85;  She ambulated 3 Laps (185'ea) w/ lowest O2sat=96% w/ pulse=114... 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 Etc... We plan ROV in 6wks.  ADDENDUM>>  Records received from DrSharma>> seen 10/2013 thru 12/2014 w/ AR & mild persistent Asthma on Qvar40-2spBid, ProairHFA prn but pt had compliance issues and never used the Qvar regularly...  Allergy Testing>  Pos for dust mites> cockroach> Dog dander> sm reaction to a few trees...  Spirometry>  FVC=2.89 (101%), FEV1=1.86 (76%), %1sec=64, mid-flows reduced at 41% predicted...  ~  January 18, 2016:  6wk ROV w/ SN>  Catherine Osborne is improved on the QVar80-2spBid regularly, Pred taper- down to 10mg /d now, Albut vs HFA prn;  She has not had an asthma flair since her last visit;  She also tells me that she was not 100% honest- she was smoking cigarettes & little cigars (since age 34, up to 1/2 pack per day) and she notes that this definitely affects her asthma;  She also didn't take the Prednisone as directed=> off now...     ASTHMA & mild COPD- GOLD Stage1> on QVar80-2spBid, ProairHFA prn, Mucinex prn; main triggers are URIs, exposure to allergens, change in weather.Marland KitchenMarland KitchenMarland Kitchen  Cigarette & other smoking> advised to quit & stay quit.    Hx poor compliance w/ med recommendations...    Allergies> eval per DrSharma, off immunotherapy at present. EXAM shows Afeb, VSS, O2sat=98% on RA;  HEENT- neg, mallampati2, palp thyroid;  Chest- clear w/o w/r/r;  Heart- RR w/o m/r/g heard;  Abd- soft, nontender, neg;  Ext- neg w/o c/c/e;  Neuro- intact... IMP/PLAN>>  Catherine Osborne is rec to quit all smoking & stay quit, avoid infections, and comply w/ all recommended vaccinations & meds;  Use the QVar80-2spBid, ProairHFA as needed, Mucinex600 1-2Bid w/ fluids...  ~  July 19, 2016:   19mo ROV & Catherine Osborne admits to continued smoking- currently ~1/3rd PPD "due to stress" she says;  Her asthma has been adeq controlled on meds- QVar80-2spBid + prn Albuterol via Neb or HFA as needed;  She has a long hx of poor compliance w/ med rx & ?is again using the QVar just as needed?  She shares a rescue inhaler w/ her son;  She denies much cough or sputum, no hemoptysis, says she is not "tight or wheezy", no CP, and SB at baseline- the weather & humidity are an issue;  She is not engaged in a regular exercise program...     EXAM shows Afeb, VSS, O2sat=97% on RA;  HEENT- neg, mallampati2, palp thyroid;  Chest- clear w/o w/r/r;  Heart- RR w/o m/r/g heard;  Abd- soft, nontender, neg;  Ext- neg w/o c/c/e;  Neuro- intact... IMP/PLAN>>  Catherine Osborne is her own worst enemy- needs to quit all smoking and take her prescribed meds regularly; even so her asthma has been under reasonable control w/o interval exacerbations, ER visit/ Hosp, etc;   Meds can be refllled by her PCP and prn follow up in pulm office for any respiratory issues...    ~  February 15, 2017:  42mo ROV & Catherine Osborne review shows mult ER visits and GYN checks by [facility]... she returns here today c/o recent cold/ URI w/ incr asthma symptoms- she has been using liquid Mucinex OTC w/o much relief and ran out of her neb meds (Albuterol), Qvar80, and her rescue inhaler;  She notes mild cough, small amt beige sput, no hemoptysis; she says her breathing is worse overall w/ this exac but talking in full sentences, no accessory musc use, no loud wheezing although she c/o subjective wheezing intermittently; she is still smoking as before <1/2ppd she says... We reviewed the following medical problems during today's office visit>      ASTHMA & mild COPD- GOLD Stage1> supposed to be on QVar80-2spBid, ProairHFA prn, Mucinex prn; she has a home neb w/ Albuterol for prn use as well; main triggers are URIs, exposure to allergens, change in weather....    Cigarette & other  smoking> advised to quit & stay quit! We discussed smoking cessation & she declines Bupropion/ Chantix/ etc...    Hx poor compliance w/ med recommendations> recurrent issue of poor compliance w/ med rx- she stops her Qvar controller med whenshe improves from acute symptoms- we explained how this leads to airway remodeling & COPD.    Allergies> eval per DrSharma, off immunotherapy at present. EXAM shows Afeb, VSS, O2sat=97% on RA;  HEENT- neg, mallampati2, palp thyroid;  Chest- few bibasilar rhonchi, scat wheezing,  w/o consolidation;  Heart- RR w/o m/r/g heard;  Abd- soft, nontender, neg;  Ext- neg w/o c/c/e;  Neuro- intact... IMP/PLAN>>  Catherine Osborne has a mild asthma exac ppt by her URI;  We discussed treatment w/ Zpak, Depo80,  Prednisone taper , AND the importance of restarting Qvar80- spBid & Proair rescue inhaler as needed;  The imoprtance of smoking cessation and medication compliance was stressed to her...     Past Medical History:  Diagnosis Date  . Allergy   . Asthma   . Headache   . 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 Pneumonia   . Prior miscarriage with pregnancy in first trimester, antepartum   . Vaginal Pap smear, abnormal    Left nephrolithiasis (29mm lower pole) seen on prev Abd sonar 05/2015  Anemia w/ Hg=11.9, MCV=84  Hx low serum Albumin ~2.6-2.7 range  Palp thyroid & left thyroid nodule - eval by DrWyatt, [facility] in 2013   Past Surgical History:  Procedure Laterality Date  . CERVICAL CERCLAGE N/A 02/26/2015   Procedure: CERCLAGE CERVICAL;  Surgeon: Frederico Hamman, MD;  Location: [facility];  Service: Gynecology;  Laterality: N/A;  . DILATION AND CURETTAGE OF UTERUS    . DILATION AND EVACUATION N/A 05/22/2012   Procedure: DILATATION AND EVACUATION;  Surgeon: Frederico Hamman, MD;  Location: [facility];  Service: Gynecology;  Laterality: N/A;  . FOOT SURGERY      Outpatient Encounter Medications as of 02/15/2017  Medication Sig  . albuterol (PROVENTIL HFA;VENTOLIN HFA) 108 (90  Base) MCG/ACT inhaler Inhale 1-2 puffs into the lungs every 6 (six) hours as needed for wheezing or shortness of breath.  Marland Kitchen albuterol (PROVENTIL) (2.5 MG/3ML) 0.083% nebulizer solution Take 3 mLs (2.5 mg total) by nebulization every 6 (six) hours as needed for wheezing or shortness of breath.  . [DISCONTINUED] albuterol (PROVENTIL HFA;VENTOLIN HFA) 108 (90 Base) MCG/ACT inhaler Inhale 1-2 puffs into the lungs every 6 (six) hours as needed for wheezing or shortness of breath.  . [DISCONTINUED] cephALEXin (KEFLEX) 250 MG capsule Take 1 capsule (250 mg total) 4 (four) times daily by mouth.  . [DISCONTINUED] doxycycline (VIBRAMYCIN) 100 MG capsule Take 1 capsule (100 mg total) 2 (two) times daily by mouth.  . [DISCONTINUED] HYDROcodone-acetaminophen (NORCO/VICODIN) 5-325 MG tablet Take 1 tablet every 4 (four) hours as needed by mouth for moderate pain.  . [DISCONTINUED] metroNIDAZOLE (FLAGYL) 500 MG tablet Take 1 tablet (500 mg total) 2 (two) times daily by mouth.  . [DISCONTINUED] ondansetron (ZOFRAN) 4 MG tablet Take 1 tablet (4 mg total) every 6 (six) hours by mouth.  Marland Kitchen azithromycin (ZITHROMAX) 250 MG tablet Take as directed  . beclomethasone (QVAR REDIHALER) 80 MCG/ACT inhaler Inhale 2 puffs into the lungs 2 (two) times daily.  . predniSONE (DELTASONE) 20 MG tablet Take as directed  . [DISCONTINUED] beclomethasone (QVAR REDIHALER) 80 MCG/ACT inhaler Inhale 2 puffs into the lungs 2 (two) times daily. (Patient not taking: Reported on 02/15/2017)  . [EXPIRED] methylPREDNISolone acetate (DEPO-MEDROL) injection 80 mg   . [DISCONTINUED] 0.9 %  sodium chloride infusion   . [DISCONTINUED] cefTRIAXone (ROCEPHIN) injection 250 mg    No facility-administered encounter medications on file as of 02/15/2017.     No Known Allergies  She had allergy eval from DrSharma & was on shots in 2016   Immunization History  Administered Date(s) Administered  . Influenza,inj,Quad PF,6+ Mos 10/31/2016  . Tdap 06/13/2015     Current Medications, Allergies, Past Medical History, Past Surgical History, Family History, and Social History were reviewed in [employer] record.   Review of Systems             All symptoms NEG except where BOLDED >>  Constitutional:  F/C/S, fatigue, anorexia, unexpected  weight change. HEENT:  HA, visual changes, hearing loss, earache, nasal symptoms, sore throat, mouth sores, hoarseness. Resp:  cough, sputum, hemoptysis; SOB, tightness, wheezing. Cardio:  CP, palpit, DOE, orthopnea, edema. GI:  N/V/D/C, blood in stool; reflux, abd pain, distention, gas. GU:  dysuria, freq, urgency, hematuria, flank pain, voiding difficulty. MS:  joint pain, swelling, tenderness, decr ROM; neck pain, back pain, etc. Neuro:  HA, tremors, seizures, dizziness, syncope, weakness, numbness, gait abn. Skin:  suspicious lesions or skin rash. Heme:  adenopathy, bruising, bleeding. Psyche:  confusion, agitation, sleep disturbance, hallucinations, anxiety, depression suicidal.   Objective:   Physical Exam       Vital Signs:  Reviewed...   General:  WD, WN, 42 y/o BF in NAD; alert & oriented; pleasant & cooperative... HEENT:  [place]/AT; Conjunctiva- pink, Sclera- nonicteric, EOM-wnl, PERRLA; EACs-clear, TMs-wnl; NOSE-clear; THROAT-clear & wnl.  Neck:  Supple w/ full ROM; no JVD; normal carotid impulses w/o bruits; palp thyroid; no lymphadenopathy.  Chest:  Few scat rhonchi & end-exp wheezes heard. Heart:  Regular Rhythm; norm S1 & S2 without murmurs, rubs, or gallops detected. Abdomen:  Soft & nontender- no guarding or rebound; normal bowel sounds; no organomegaly or masses palpated. Ext:  Normal ROM; without deformities or arthritic changes; no varicose veins, venous insuffic, or edema;  Pulses intact w/o bruits. Neuro:  CNs II-XII intact; motor testing normal; sensory testing normal; gait normal & balance OK. Derm:  No lesions noted; no rash etc. Lymph:  No cervical,  supraclavicular, axillary, or inguinal adenopathy palpated.   Assessment:       IMP >>     ASTHMA - mild persistent>  We discussed a tapering course of Pred + QVAR80-2spBid & ProairHFA as needed...    +Smoker>  she had been smoking ~1/2 ppd & was not honest w/ [place] when 1st seen...    Underlying allergies>  Prev eval by DrSharma, she was on shots in 2016, we will try to get records...    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 Etc... We plan ROV in 6wks. 01/18/16>   Catherine Osborne is rec to quit all smoking & stay quit, avoid infections, and comply w/all recommended vaccinations;  Use the QVar80-2spBid, ProairHFA as needed, Mucinex600 1-2Bid w/ fluids.. 07/19/16>   Catherine Osborne is her own worst enemy- needs to quit all smoking and take her prescribed meds regularly; even so her asthma has been under reasonable control w/o interval exacerbations, ER visit/ Hosp, etc;   Meds can be refllled by her PCP and prn follow up in pulm office for any respiratory issues...  02/15/17>   Catherine Osborne has a mild asthma exac ppt by her URI;  We discussed treatment w/ Zpak, Depo80, Prednisone taper , AND the importance of restarting Qvar80- spBid & Proair rescue inhaler as needed;  The imoprtance of smoking cessation and medication compliance was stressed  to her.     Plan:     Patient's Medications  New Prescriptions   AZITHROMYCIN  (ZITHROMAX) 250 MG TABLET    Take as directed   PREDNISONE (DELTASONE) 20 MG TABLET    Take as directed  Previous Medications   ALBUTEROL (PROVENTIL) (2.5 MG/3ML) 0.083% NEBULIZER SOLUTION    Take 3 mLs (2.5 mg total) by nebulization every 6 (six) hours as needed for wheezing or shortness of breath.  Modified Medications   Modified Medication Previous Medication   ALBUTEROL (PROVENTIL HFA;VENTOLIN HFA) 108 (90 BASE) MCG/ACT INHALER albuterol (PROVENTIL HFA;VENTOLIN HFA) 108 (90 Base) MCG/ACT inhaler      Inhale 1-2 puffs into the lungs every 6 (six) hours as needed for wheezing or shortness of breath.    Inhale 1-2 puffs into the lungs every 6 (six) hours as needed for wheezing or shortness of breath.   BECLOMETHASONE (QVAR REDIHALER) 80 MCG/ACT INHALER beclomethasone (QVAR REDIHALER) 80 MCG/ACT inhaler      Inhale 2 puffs into the lungs 2 (two) times daily.    Inhale 2 puffs into the lungs 2 (two) times daily.  Discontinued Medications   CEPHALEXIN (KEFLEX) 250 MG CAPSULE    Take 1 capsule (250 mg total) 4 (four) times daily by mouth.   DOXYCYCLINE (VIBRAMYCIN) 100 MG CAPSULE    Take 1 capsule (100 mg total) 2 (two) times daily by mouth.   HYDROCODONE-ACETAMINOPHEN (NORCO/VICODIN) 5-325 MG TABLET    Take 1 tablet every 4 (four) hours as needed by mouth for moderate pain.   METRONIDAZOLE (FLAGYL) 500 MG TABLET    Take 1 tablet (500 mg total) 2 (two) times daily by mouth.   ONDANSETRON (ZOFRAN) 4 MG TABLET    Take 1 tablet (4 mg total) every 6 (six) hours by mouth.

## 2017-02-15 NOTE — Patient Instructions (Signed)
Today we updated your med list in our EPIC system...     For your bronchitis>    We wrote for an antibiotic-- ZITHROMAX (Zpak) take as directed on the pack...    Continue the [place]...    NO SMOKING!!!  For the asthma>     We gave you a Depo shot for the inflammation...    Take the PREDNISONE 20mg  tabs as follows>> MARK YOUR CALENDAR       Start w/ one tab twice daily for 3 days...       Then decrease to 1 tab each AM for 3 days...       Then decrease to 1/2 tab daily weach AM for 3 days...       Then decrease to 1/2 tab every other day til gone (1/2, 0, 1/2, 0 etc)...  We refilled your Qvar (controller medication for your asthma)>    Use 2 inhalations twice daily every day...  We refilled your PROVENTIL-HFA rescue inhaler...    Use 1-2 inhalations every 4-6H as needed for wheezing...  Check back w/ DrSharma about allergy shots

## 2017-03-06 ENCOUNTER — Ambulatory Visit: Payer: Medicaid Other | Admitting: Nurse Practitioner

## 2017-04-02 ENCOUNTER — Encounter (HOSPITAL_COMMUNITY): Payer: Self-pay | Admitting: Emergency Medicine

## 2017-04-02 ENCOUNTER — Emergency Department (HOSPITAL_COMMUNITY)
Admission: EM | Admit: 2017-04-02 | Discharge: 2017-04-02 | Disposition: A | Discharge status: Home / Self Care | Payer: Medicaid Other | Attending: Emergency Medicine | Admitting: Emergency Medicine

## 2017-04-02 DIAGNOSIS — Z79899 Other long term (current) drug therapy: Secondary | ICD-10-CM | POA: Insufficient documentation

## 2017-04-02 DIAGNOSIS — J45909 Unspecified asthma, uncomplicated: Secondary | ICD-10-CM | POA: Diagnosis not present

## 2017-04-02 DIAGNOSIS — F17210 Nicotine dependence, cigarettes, uncomplicated: Secondary | ICD-10-CM | POA: Diagnosis not present

## 2017-04-02 DIAGNOSIS — R112 Nausea with vomiting, unspecified: Secondary | ICD-10-CM | POA: Diagnosis not present

## 2017-04-02 DIAGNOSIS — J302 Other seasonal allergic rhinitis: Secondary | ICD-10-CM | POA: Diagnosis not present

## 2017-04-02 DIAGNOSIS — R197 Diarrhea, unspecified: Secondary | ICD-10-CM | POA: Diagnosis not present

## 2017-04-02 DIAGNOSIS — R109 Unspecified abdominal pain: Secondary | ICD-10-CM | POA: Diagnosis present

## 2017-04-02 LAB — URINALYSIS, ROUTINE W REFLEX MICROSCOPIC
Bilirubin Urine: NEGATIVE
GLUCOSE, UA: NEGATIVE mg/dL
HGB URINE DIPSTICK: NEGATIVE
Ketones, ur: 5 mg/dL — AB
LEUKOCYTES UA: NEGATIVE
Nitrite: NEGATIVE
PH: 5 (ref 5.0–8.0)
Protein, ur: NEGATIVE mg/dL
SPECIFIC GRAVITY, URINE: 1.027 (ref 1.005–1.030)

## 2017-04-02 LAB — CBC
HEMATOCRIT: 43.4 % (ref 36.0–46.0)
Hemoglobin: 14.4 g/dL (ref 12.0–15.0)
MCH: 28.7 pg (ref 26.0–34.0)
MCHC: 33.2 g/dL (ref 30.0–36.0)
MCV: 86.6 fL (ref 78.0–100.0)
Platelets: 286 10*3/uL (ref 150–400)
RBC: 5.01 MIL/uL (ref 3.87–5.11)
RDW: 15.1 % (ref 11.5–15.5)
WBC: 7.8 10*3/uL (ref 4.0–10.5)

## 2017-04-02 LAB — COMPREHENSIVE METABOLIC PANEL
ALT: 17 U/L (ref 14–54)
AST: 21 U/L (ref 15–41)
Albumin: 3.7 g/dL (ref 3.5–5.0)
Alkaline Phosphatase: 77 U/L (ref 38–126)
Anion gap: 11 (ref 5–15)
BUN: 9 mg/dL (ref 6–20)
CHLORIDE: 104 mmol/L (ref 101–111)
CO2: 21 mmol/L — AB (ref 22–32)
Calcium: 9 mg/dL (ref 8.9–10.3)
Creatinine, Ser: 0.7 mg/dL (ref 0.44–1.00)
Glucose, Bld: 93 mg/dL (ref 65–99)
POTASSIUM: 3.4 mmol/L — AB (ref 3.5–5.1)
SODIUM: 136 mmol/L (ref 135–145)
Total Bilirubin: 0.5 mg/dL (ref 0.3–1.2)
Total Protein: 6.7 g/dL (ref 6.5–8.1)

## 2017-04-02 LAB — LIPASE, BLOOD: LIPASE: 27 U/L (ref 11–51)

## 2017-04-02 LAB — I-STAT BETA HCG BLOOD, ED (MC, WL, AP ONLY): I-stat hCG, quantitative: <5 m[IU]/mL (ref <5)

## 2017-04-02 NOTE — Discharge Instructions (Signed)
Your nausea and diarrhea may be due to a viral infection.  Stay hydrated.  Take antinausea medication as needed.  Return if you develop worsen abdominal pain, fever, persistent vomiting or if you have other concerns.

## 2017-04-02 NOTE — ED Notes (Signed)
Pt remains in waiting room. Updated on wait for treatment room. 

## 2017-04-02 NOTE — ED Provider Notes (Signed)
[facility] EMERGENCY DEPARTMENT Provider Note   CSN: [id number] Arrival date & time: 04/02/17  1522     History   Chief Complaint Chief Complaint  Patient presents with  . Abdominal Pain    HPI Catherine Osborne is a 42 y.o. female.  HPI   42 year old female with history of lactose intolerance presenting for evaluation of abdominal pain.  Patient report last night she ate home-cooked food made by her grandmother.  Afterwards she developed abdominal discomfort, felt nauseous, and has had some loose stools throughout the day today.  Abdominal discomfort initially was intense but now it has mostly resolved.  She has not vomited.  She denies having any fever or chills, no chest pain shortness of breath, productive cough.  Denies any urinary discomfort burning urination or rash.  Her last menstrual period was 03/16/2017.  No other complaints.  Past Medical History:  Diagnosis Date  . Allergy   . Asthma   . Headache   . 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 Pneumonia   . Prior miscarriage with pregnancy in first trimester, antepartum   . Vaginal Pap smear, abnormal     Patient Active Problem List   Diagnosis Date Noted  . Seasonal allergies 01/18/2016  . Cigarette smoker 01/18/2016  . Compliance poor 01/18/2016  . Asthma 12/07/2015  . Goiter diffuse 12/07/2015  . NSVD (normal spontaneous vaginal delivery) 06/13/2015  . Pregnancy induced hypertension 06/12/2015  . Preeclampsia 06/10/2015  . Short cervix affecting pregnancy 02/25/2015  . Advanced maternal age in multigravida 02/12/2015  . Left thyroid nodule 10/25/2011    Past Surgical History:  Procedure Laterality Date  . CERVICAL CERCLAGE N/A 02/26/2015   Procedure: CERCLAGE CERVICAL;  Surgeon: Frederico Hamman, MD;  Location: [facility];  Service: Gynecology;  Laterality: N/A;  . DILATION AND CURETTAGE OF UTERUS    . DILATION AND EVACUATION N/A 05/22/2012   Procedure: DILATATION AND EVACUATION;  Surgeon:  Frederico Hamman, MD;  Location: [facility];  Service: Gynecology;  Laterality: N/A;  . FOOT SURGERY      OB History    Gravida Para Term Preterm AB Living   14 2 1 1 12 1    SAB TAB Ectopic Multiple Live Births   10 2   0 1       Home Medications    Prior to Admission medications   Medication Sig Start Date End Date Taking? Authorizing Provider  albuterol (PROVENTIL HFA;VENTOLIN HFA) 108 (90 Base) MCG/ACT inhaler Inhale 1-2 puffs into the lungs every 6 (six) hours as needed for wheezing or shortness of breath. 02/15/17   Noralee Space, MD  albuterol (PROVENTIL) (2.5 MG/3ML) 0.083% nebulizer solution Take 3 mLs (2.5 mg total) by nebulization every 6 (six) hours as needed for wheezing or shortness of breath. 10/08/15   Noe Gens, PA-C  azithromycin (ZITHROMAX) 250 MG tablet Take as directed 02/15/17   Noralee Space, MD  beclomethasone (QVAR REDIHALER) 80 MCG/ACT inhaler Inhale 2 puffs into the lungs 2 (two) times daily. 02/15/17   Noralee Space, MD  predniSONE (DELTASONE) 20 MG tablet Take as directed 02/15/17   Noralee Space, MD    Family History Family History  Adopted: Yes  Problem Relation Age of Onset  . Bone cancer Mother   . Breast cancer Maternal Grandmother   . Diabetes Unknown   . Hypertension Unknown   . Stroke Unknown   . Heart attack Unknown     Social  History Social History   Tobacco Use  . Smoking status: Current Some Day Smoker    Packs/day: 1.00    Years: 20.00    Pack years: 20.00    Types: Cigarettes    Last attempt to quit: 01/05/2014    Years since quitting: 3.2  . Smokeless tobacco: Never Used  . Tobacco comment: marijuana/30 mins ago  Substance Use Topics  . Alcohol use: Yes    Alcohol/week: 0.6 oz    Types: 1 Glasses of wine per week    Comment: rare  . Drug use: Yes    Types: Marijuana    Comment: occasional use     Allergies   Patient has no known allergies.   Review of Systems Review of Systems  All other systems reviewed and  are negative.    Physical Exam Updated Vital Signs BP 114/79 (BP Location: Left Arm)   Pulse 64   Temp 98.8 F (37.1 C) (Oral)   Resp 17   Ht 5\' 2"  (1.575 m)   Wt 72.6 kg (160 lb)   LMP 03/16/2017   SpO2 100%   BMI 29.26 kg/m   Physical Exam  Constitutional: She appears well-developed and well-nourished. No distress.  HENT:  Head: Atraumatic.  Mouth/Throat: Oropharynx is clear and moist.  Eyes: Conjunctivae are normal.  Neck: Neck supple.  Cardiovascular: Regular rhythm.  Pulmonary/Chest: Effort normal and breath sounds normal.  Abdominal: Soft. Normal appearance and bowel sounds are normal. She exhibits no distension. There is no tenderness. There is no rebound, no guarding, no tenderness at McBurney's point and negative Murphy's sign.  Neurological: She is alert.  Skin: Skin is warm. No rash noted.  Psychiatric: She has a normal mood and affect.  Nursing note and vitals reviewed.    ED Treatments / Results  Labs (all labs ordered are listed, but only abnormal results are displayed) Labs Reviewed  COMPREHENSIVE METABOLIC PANEL - Abnormal; Notable for the following components:      Result Value   Potassium 3.4 (*)    CO2 21 (*)    All other components within normal limits  URINALYSIS, ROUTINE W REFLEX MICROSCOPIC - Abnormal; Notable for the following components:   Ketones, ur 5 (*)    All other components within normal limits  LIPASE, BLOOD  CBC  I-STAT BETA HCG BLOOD, ED ([facility], [facility], [facility] ONLY)    EKG  EKG Interpretation None       Radiology No results found.  Procedures Procedures (including critical care time)  Medications Ordered in ED Medications - No data to display   Initial Impression / Assessment and Plan / ED Course  I have reviewed the triage vital signs and the nursing notes.  Pertinent labs & imaging results that were available during my care of the patient were reviewed by me and considered in my medical decision making (see chart for  details).     BP 114/79 (BP Location: Left Arm)   Pulse 64   Temp 98.8 F (37.1 C) (Oral)   Resp 17   Ht 5\' 2"  (1.575 m)   Wt 72.6 kg (160 lb)   LMP 03/16/2017   SpO2 100%   BMI 29.26 kg/m    Final Clinical Impressions(s) / ED Diagnoses   Final diagnoses:  Nausea vomiting and diarrhea    ED Discharge Orders    None     5:40 PM Patient here for abdominal pain with nausea vomiting diarrhea after eating food last night.  Symptom has  improved without any specific treatment.  She is currently without any significant abdominal discomfort on exam.  She is afebrile with stable normal vital sign.  Her pregnancy test is normal, lipase is normal, electrolytes panels are reassuring, normal WBC and H&H, urine shows no signs of urinary tract infection.  At this time patient is stable for discharge.  Suspect viral etiology causing her symptoms.  Return precautions discussed.   Domenic Moras, PA-C 04/02/17 1743    Tanna Furry, MD 04/03/17 [phone]

## 2017-04-02 NOTE — ED Triage Notes (Signed)
Pt reported N/V/D while at work today. Pt also reported RUQ pain.

## 2017-04-26 ENCOUNTER — Inpatient Hospital Stay (HOSPITAL_COMMUNITY)
Admission: AD | Admit: 2017-04-26 | Discharge: 2017-04-26 | Disposition: A | Discharge status: Home / Self Care | Payer: Medicaid Other | Source: Ambulatory Visit | Attending: Obstetrics & Gynecology | Admitting: Obstetrics & Gynecology

## 2017-04-26 ENCOUNTER — Encounter (HOSPITAL_COMMUNITY): Payer: Self-pay | Admitting: Radiology

## 2017-04-26 DIAGNOSIS — F17210 Nicotine dependence, cigarettes, uncomplicated: Secondary | ICD-10-CM | POA: Diagnosis not present

## 2017-04-26 DIAGNOSIS — N760 Acute vaginitis: Secondary | ICD-10-CM | POA: Insufficient documentation

## 2017-04-26 DIAGNOSIS — B9689 Other specified bacterial agents as the cause of diseases classified elsewhere: Secondary | ICD-10-CM | POA: Diagnosis not present

## 2017-04-26 DIAGNOSIS — N898 Other specified noninflammatory disorders of vagina: Secondary | ICD-10-CM | POA: Diagnosis present

## 2017-04-26 LAB — URINALYSIS, ROUTINE W REFLEX MICROSCOPIC
Bilirubin Urine: NEGATIVE
Glucose, UA: NEGATIVE mg/dL
Hgb urine dipstick: NEGATIVE
KETONES UR: NEGATIVE mg/dL
LEUKOCYTES UA: NEGATIVE
NITRITE: NEGATIVE
PROTEIN: NEGATIVE mg/dL
Specific Gravity, Urine: 1.019 (ref 1.005–1.030)
pH: 7 (ref 5.0–8.0)

## 2017-04-26 LAB — WET PREP, GENITAL
Sperm: NONE SEEN
TRICH WET PREP: NONE SEEN
YEAST WET PREP: NONE SEEN

## 2017-04-26 MED ORDER — METRONIDAZOLE 500 MG PO TABS: 500.0000 mg | ORAL_TABLET | Freq: Two times a day (BID) | ORAL | 0 refills | Status: DC

## 2017-04-26 NOTE — MAU Note (Signed)
Pt states she has been experimenting and started to put cocaine in vagina for last 2 months. States since she started doing this she's noticed a thick, white discharge with an odor. Has been occurring for 2 weeks ago. Pt denies vaginal bleeding. LMP: 04/13/2017

## 2017-04-26 NOTE — MAU Provider Note (Signed)
History     CSN: [id number]  Arrival date and time: 04/26/17 1027   First Provider Initiated Contact with Patient 04/26/17 2122      Chief Complaint  Patient presents with  . Vaginal Discharge   Catherine Osborne is a 42 y.o.[id number] who presents today with vaginal discharge. She states that for about 2 months she has been putting cocaine in her vagina. She reports that this produces the desired effect. However, she is now having discharge and irritation.   Vaginal Discharge  The patient's primary symptoms include genital itching and vaginal discharge. The patient's pertinent negatives include no pelvic pain. This is a new problem. The current episode started 1 to 4 weeks ago. The problem occurs constantly. The problem has been unchanged. The patient is experiencing no pain. She is not pregnant. Pertinent negatives include no chills, dysuria, fever, frequency, nausea, urgency or vomiting. The vaginal discharge was copious and white. Exacerbated by: cocaine in the vagina.  She has tried nothing for the symptoms. Her menstrual history has been regular (LMP 04/13/17 ).   Past Medical History:  Diagnosis Date  . Allergy   . Asthma   . Headache   . 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 Pneumonia   . Prior miscarriage with pregnancy in first trimester, antepartum   . Vaginal Pap smear, abnormal     Past Surgical History:  Procedure Laterality Date  . CERVICAL CERCLAGE N/A 02/26/2015   Procedure: CERCLAGE CERVICAL;  Surgeon: Frederico Hamman, MD;  Location: [facility];  Service: Gynecology;  Laterality: N/A;  . DILATION AND CURETTAGE OF UTERUS    . DILATION AND EVACUATION N/A 05/22/2012   Procedure: DILATATION AND EVACUATION;  Surgeon: Frederico Hamman, MD;  Location: [facility];  Service: Gynecology;  Laterality: N/A;  . FOOT SURGERY      Family History  Adopted: Yes  Problem Relation Age of Onset  . Bone cancer Mother   . Breast cancer Maternal Grandmother   . Diabetes Unknown   .  Hypertension Unknown   . Stroke Unknown   . Heart attack Unknown     Social History   Tobacco Use  . Smoking status: Current Some Day Smoker    Packs/day: 1.00    Years: 20.00    Pack years: 20.00    Types: Cigarettes    Last attempt to quit: 01/05/2014    Years since quitting: 3.3  . Smokeless tobacco: Never Used  . Tobacco comment: marijuana/30 mins ago  Substance Use Topics  . Alcohol use: Yes    Alcohol/week: 0.6 oz    Types: 1 Glasses of wine per week    Comment: rare  . Drug use: Yes    Types: Marijuana    Comment: occasional use    Allergies: No Known Allergies  Medications Prior to Admission  Medication Sig Dispense Refill Last Dose  . albuterol (PROVENTIL HFA;VENTOLIN HFA) 108 (90 Base) MCG/ACT inhaler Inhale 1-2 puffs into the lungs every 6 (six) hours as needed for wheezing or shortness of breath. 1 Inhaler 6   . albuterol (PROVENTIL) (2.5 MG/3ML) 0.083% nebulizer solution Take 3 mLs (2.5 mg total) by nebulization every 6 (six) hours as needed for wheezing or shortness of breath. 75 mL 12 Taking  . azithromycin (ZITHROMAX) 250 MG tablet Take as directed 6 tablet 0   . beclomethasone (QVAR REDIHALER) 80 MCG/ACT inhaler Inhale 2 puffs into the lungs 2 (two) times daily. 1 Inhaler 2   . predniSONE (DELTASONE) 20  MG tablet Take as directed 15 tablet 0     Review of Systems  Constitutional: Negative for chills and fever.  Gastrointestinal: Negative for nausea and vomiting.  Genitourinary: Positive for vaginal discharge. Negative for dysuria, frequency, pelvic pain, urgency and vaginal bleeding.   Physical Exam   Blood pressure 116/86, pulse 80, temperature 98.9 F (37.2 C), temperature source Oral, resp. rate 18, height 5\' 2"  (1.575 m), weight 163 lb (73.9 kg), last menstrual period 04/13/2017, SpO2 98 %, not currently breastfeeding.  Physical Exam  Nursing note and vitals reviewed. Constitutional: She is oriented to person, place, and time. She appears  well-developed and well-nourished. No distress.  HENT:  Head: Normocephalic.  Cardiovascular: Normal rate.  Respiratory: Effort normal.  GI: Soft. There is no tenderness. There is no rebound.  Neurological: She is alert and oriented to person, place, and time.  Skin: Skin is warm and dry.  Psychiatric: She has a normal mood and affect.   Results for orders placed or performed during the hospital encounter of 04/26/17 (from the past 24 hour(s))  Urinalysis, Routine w reflex microscopic     Status: Abnormal   Collection Time: 04/26/17  7:25 PM  Result Value Ref Range   Color, Urine YELLOW YELLOW   APPearance HAZY (A) CLEAR   Specific Gravity, Urine 1.019 1.005 - 1.030   pH 7.0 5.0 - 8.0   Glucose, UA NEGATIVE NEGATIVE mg/dL   Hgb urine dipstick NEGATIVE NEGATIVE   Bilirubin Urine NEGATIVE NEGATIVE   Ketones, ur NEGATIVE NEGATIVE mg/dL   Protein, ur NEGATIVE NEGATIVE mg/dL   Nitrite NEGATIVE NEGATIVE   Leukocytes, UA NEGATIVE NEGATIVE  Wet prep, genital     Status: Abnormal   Collection Time: 04/26/17  7:25 PM  Result Value Ref Range   Yeast Wet Prep HPF POC NONE SEEN NONE SEEN   Trich, Wet Prep NONE SEEN NONE SEEN   Clue Cells Wet Prep HPF POC PRESENT (A) NONE SEEN   WBC, Wet Prep HPF POC FEW (A) NONE SEEN   Sperm NONE SEEN     MAU Course  Procedures  MDM   Assessment and Plan   1. Bacterial vaginosis    DC home Comfort measures reviewed  RX: flagyl 500mg  BID x 7 days #14 0RF  Return to MAU as needed FU with OB as planned  Follow-up Information    Department, [facility] Follow up.   Contact information: [place] [id number] [phone]            Marcille Buffy 04/26/2017, 9:22 PM

## 2017-04-26 NOTE — Discharge Instructions (Signed)
In late 2019, the [facility] will be moving to the [place] campus. At that time, the MAU (Maternity Admissions Unit), where you are being seen today, will no longer take care of non-pregnant patients. We strongly encourage you to find a doctor's office before that time, so that you can be seen with any GYN concerns, like vaginal discharge, urinary tract infection, etc.. in a timely manner. ° °In order to make an office visit more convenient, the [facility] will be offering evening hours with same-day appointments, walk-in appointments and scheduled appointments available during this time. ° °[facility] Hours: °Monday - 8am - 7:30 pm with walk-in between 4pm- 7:30 pm °Tuesday - 8 am - 5 pm (starting 05/02/17 we will be open late and accepting walk-ins from 4pm - 7:30pm) °Wednesday - 8 am - 5 pm (starting 08/02/17 we will be open late and accepting walk-ins from 4pm - 7:30pm) °Thursday 8 am - 5 pm (starting 11/02/17 we will be open late and accepting walk-ins from 4pm - 7:30pm) °Friday 8 am - 5 pm ° °For an appointment please call the [facility] at [phone] ° °For urgent needs,  Urgent Care is also available for management of urgent GYN complaints such as vaginal discharge or urinary tract infections. ° ° ° ° ° °

## 2017-04-27 LAB — GC/CHLAMYDIA PROBE AMP (~~LOC~~) NOT AT ARMC
CHLAMYDIA, DNA PROBE: NEGATIVE
NEISSERIA GONORRHEA: NEGATIVE

## 2017-05-12 ENCOUNTER — Ambulatory Visit: Payer: Medicaid Other | Admitting: Podiatry

## 2017-05-12 ENCOUNTER — Other Ambulatory Visit: Payer: Self-pay | Admitting: Podiatry

## 2017-05-12 ENCOUNTER — Encounter: Payer: Self-pay | Admitting: Podiatry

## 2017-05-12 ENCOUNTER — Ambulatory Visit (INDEPENDENT_AMBULATORY_CARE_PROVIDER_SITE_OTHER): Payer: Medicaid Other

## 2017-05-12 DIAGNOSIS — M778 Other enthesopathies, not elsewhere classified: Secondary | ICD-10-CM

## 2017-05-12 DIAGNOSIS — M21622 Bunionette of left foot: Secondary | ICD-10-CM

## 2017-05-12 DIAGNOSIS — M779 Enthesopathy, unspecified: Principal | ICD-10-CM

## 2017-05-12 DIAGNOSIS — M216X2 Other acquired deformities of left foot: Secondary | ICD-10-CM

## 2017-05-12 DIAGNOSIS — M7752 Other enthesopathy of left foot: Secondary | ICD-10-CM

## 2017-05-12 MED ORDER — TRIAMCINOLONE ACETONIDE 10 MG/ML IJ SUSP
10.0000 mg | Freq: Once | INTRAMUSCULAR | Status: AC
  Administered 2017-05-12: 10 mg

## 2017-05-14 NOTE — Progress Notes (Signed)
Subjective:   [name], female   DOB: 42 y.o.   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  At this time I did do a capsular injection 3 mg Dexasone Kenalog 5 mg Xylocaine aggressive debridement of lesion and we will see how long this lasts for.  May require more aggressive treatment

## 2017-06-21 ENCOUNTER — Ambulatory Visit (INDEPENDENT_AMBULATORY_CARE_PROVIDER_SITE_OTHER): Payer: Medicaid Other

## 2017-06-21 VITALS — BP 113/72 | HR 72 | Wt 147.9 lb

## 2017-06-21 DIAGNOSIS — Z3202 Encounter for pregnancy test, result negative: Secondary | ICD-10-CM

## 2017-06-21 LAB — POCT URINE PREGNANCY: PREG TEST UR: NEGATIVE

## 2017-06-21 NOTE — Progress Notes (Signed)
Presented for UPT.  UPT today is Negative.  Patient says is trying to get pregnant and is 7 days late.  She was advised to give her periods another week and get another test done and start PNV, she stated that she would prefer the Quant Hcg.    Catherine Osborne presents today for UPT. She has no unusual complaints. LMP:05/07/17 OBJECTIVE: Appears well, in no apparent distress.   OB History    Gravida  14   Para  2   Term  1   Preterm  1   AB  12   Living  1     SAB  10   TAB  2   Ectopic      Multiple  0   Live Births  1          Home UPT Result: Negative In-Office UPT result: Negative  I have reviewed the patient's medical, obstetrical, social, and family histories, and medications.   ASSESSMENT: NEGATIVE pregnancy test  PLAN Start taking PNV, return in one week for Pregnancy test, she stated that she would prefer the [facility].  Prenatal care to be completed at: NA

## 2017-06-27 ENCOUNTER — Other Ambulatory Visit: Payer: Medicaid Other

## 2017-06-27 DIAGNOSIS — Z3200 Encounter for pregnancy test, result unknown: Secondary | ICD-10-CM

## 2017-06-28 LAB — HCG, SERUM, QUALITATIVE: hCG,Beta Subunit,Qual,Serum: NEGATIVE m[IU]/mL (ref <6)

## 2017-06-29 ENCOUNTER — Ambulatory Visit: Payer: Medicaid Other | Admitting: Podiatry

## 2017-06-29 ENCOUNTER — Encounter: Payer: Self-pay | Admitting: Podiatry

## 2017-06-29 DIAGNOSIS — D492 Neoplasm of unspecified behavior of bone, soft tissue, and skin: Secondary | ICD-10-CM | POA: Diagnosis not present

## 2017-06-29 DIAGNOSIS — M778 Other enthesopathies, not elsewhere classified: Secondary | ICD-10-CM

## 2017-06-29 DIAGNOSIS — M21622 Bunionette of left foot: Secondary | ICD-10-CM | POA: Diagnosis not present

## 2017-06-29 DIAGNOSIS — M779 Enthesopathy, unspecified: Secondary | ICD-10-CM | POA: Diagnosis not present

## 2017-06-30 NOTE — Progress Notes (Signed)
Subjective:   [name], female   DOB: 42 y.o.   MRN: [id number]   HPI Patient presents stating that this lesion is really bothering her and it only got better for a couple weeks with aggressive trimming and injection treatment.  States she cannot take the pain and she needs something more definitive done for this   ROS      Objective:  Physical Exam  Neurovascular status found to be intact muscle strength was adequate with patient found to have severe discomfort with prominence of the fifth metatarsal head left with large lesion formation that is very painful when pressed plantar.     Assessment:  Chronic lesion formation secondary to probable pressure from the metatarsal and damage within the skin itself     Plan:  H&P condition reviewed at great length.  She only was able to achieve several weeks of relief and cannot walk on the side of her foot and she is desiring a more permanent type solution.  I have recommended fifth metatarsal head resection with excision of the plantar lesion and I did explain that she had a previous shaving of this bone but it was not successful in helping her condition.  I did explain that there is no guarantee that this lesion will go away and there is no guarantee she will not develop some kind of compensation for it and I do think this is the best chance she has added getting some relief of her pain.  I allowed her to read consent form going over alternative treatments complications and patient wants surgery after extensive review and understands total recovery can take 6 months to 1 year.  Patient is scheduled for outpatient surgery and is encouraged to call us with any questions she may have prior to procedure

## 2017-07-04 ENCOUNTER — Encounter: Payer: Self-pay | Admitting: Podiatry

## 2017-07-04 DIAGNOSIS — M21542 Acquired clubfoot, left foot: Secondary | ICD-10-CM | POA: Diagnosis not present

## 2017-07-04 DIAGNOSIS — D492 Neoplasm of unspecified behavior of bone, soft tissue, and skin: Secondary | ICD-10-CM | POA: Diagnosis not present

## 2017-07-06 ENCOUNTER — Telehealth: Payer: Self-pay | Admitting: Podiatry

## 2017-07-06 NOTE — Telephone Encounter (Addendum)
Pt states her foot feels like it has a heart beat, and she feels like dressing is loose. I asked pt to check if the temperature of her toes was the same as the non-surgery foot and she said she couldn't tell. She said the surgery foot was cold, but it was cold in there and the non-surgery foot was sweating. I asked pt to remove the ace and she said she already taken the white loose sock off. I told her that was fine, now take off the stretch band ace wrap and elevate the foot but if the pain worsens elevated then dangle 15 minutes. I told pt the foot may be a slightly different color, due to the cleanser used, pt stated her foot was lighter. I told pt to dangle for a while to see if this helped with the sensation and color. After 4 minutes pt stated the foot hurt worse and she felt like she could feels something in the foot move and the stitches moving. I told pt to place foot level with her hip and to rewrap the foot in about 10 minutes with the ace wrap starting at the toes wrapping loosely as moving up the leg. I told pt to take the pain medication as the doctor directed and if tolerated take OTC advil/ibuprofen 2 gel capsules as pt has at home as directed on the bottle. I told pt to go back into the surgical boot, for protection and positioning. Pt states she is in the boot when walking, but the doctor said she could be out of it when resting. I told pt she would benefit from being in the boot at all times because it would support and protect the foot. Pt states she can't rest in the boot. I told pt I felt she would benefit from being seen in office tomorrow.Transferred pt to scheduler for an appt tomorrow.

## 2017-07-06 NOTE — Telephone Encounter (Signed)
Pt is in severe pain. She would like her medication changed to something stronger. Please give patient a call back.

## 2017-07-07 ENCOUNTER — Ambulatory Visit (INDEPENDENT_AMBULATORY_CARE_PROVIDER_SITE_OTHER): Payer: Self-pay | Admitting: Podiatry

## 2017-07-07 VITALS — BP 109/68 | HR 68 | Temp 98.2°F

## 2017-07-07 DIAGNOSIS — M21622 Bunionette of left foot: Secondary | ICD-10-CM

## 2017-07-07 MED ORDER — ONDANSETRON HCL 4 MG PO TABS: 4.0000 mg | ORAL_TABLET | Freq: Three times a day (TID) | ORAL | 0 refills | Status: DC | PRN

## 2017-07-07 MED ORDER — HYDROCODONE-ACETAMINOPHEN 10-325 MG PO TABS: 1.0000 | ORAL_TABLET | ORAL | 0 refills | Status: DC | PRN

## 2017-07-09 NOTE — Progress Notes (Signed)
Subjective:   [name], female   DOB: 42 y.o.   MRN: [id number]   HPI Patient was concerned because her foot was hurting and throbbing and she wanted it checked   ROS      Objective:  Physical Exam  Neurovascular status intact negative Homans sign noted with wound edges healing very well with the just the inner dressing left on but no indications or redness drainage around the area     Assessment:  Appears to be just having a postoperative inflammatory process     Plan:  Fluffy sterile dressing reapplied and advised on continued elevation compression and we did write her for a continue pain medication.  Gave strict instructions of any changes were to occur to inform us immediately

## 2017-07-12 ENCOUNTER — Ambulatory Visit (INDEPENDENT_AMBULATORY_CARE_PROVIDER_SITE_OTHER): Payer: Medicaid Other

## 2017-07-12 ENCOUNTER — Other Ambulatory Visit: Payer: Medicaid Other

## 2017-07-12 ENCOUNTER — Encounter: Payer: Self-pay | Admitting: Podiatry

## 2017-07-12 ENCOUNTER — Ambulatory Visit (INDEPENDENT_AMBULATORY_CARE_PROVIDER_SITE_OTHER): Payer: Medicaid Other | Admitting: Podiatry

## 2017-07-12 DIAGNOSIS — D492 Neoplasm of unspecified behavior of bone, soft tissue, and skin: Secondary | ICD-10-CM

## 2017-07-12 DIAGNOSIS — M21622 Bunionette of left foot: Secondary | ICD-10-CM | POA: Diagnosis not present

## 2017-07-12 NOTE — Progress Notes (Signed)
Subjective:   [name], female   DOB: 42 y.o.   MRN: [id number]   HPI Patient presents stating she is doing well with her left foot with occasional discomfort   ROS      Objective:  Physical Exam  Neurovascular status intact negative Homans sign was noted with patient's left foot healing well wound edges well coapted     Assessment:  Doing well post metatarsal head resection plus removal of plantar skin wedge     Plan:  Advised on continued compression elevation and reappoint for patient to recheck again in the next 2 weeks or earlier if any issues should occur.  Reapplied sterile dressing  X-ray indicates satisfactory resection of bone

## 2017-07-13 ENCOUNTER — Ambulatory Visit (INDEPENDENT_AMBULATORY_CARE_PROVIDER_SITE_OTHER): Payer: Medicaid Other | Admitting: Obstetrics

## 2017-07-13 ENCOUNTER — Encounter: Payer: Self-pay | Admitting: Obstetrics

## 2017-07-13 ENCOUNTER — Other Ambulatory Visit (HOSPITAL_COMMUNITY)
Admission: RE | Admit: 2017-07-13 | Discharge: 2017-07-13 | Disposition: A | Discharge status: Home / Self Care | Payer: Medicaid Other | Source: Ambulatory Visit | Attending: Obstetrics | Admitting: Obstetrics

## 2017-07-13 VITALS — BP 116/83 | HR 93 | Wt 142.6 lb

## 2017-07-13 DIAGNOSIS — B373 Candidiasis of vulva and vagina: Secondary | ICD-10-CM | POA: Insufficient documentation

## 2017-07-13 DIAGNOSIS — Z3202 Encounter for pregnancy test, result negative: Secondary | ICD-10-CM | POA: Diagnosis not present

## 2017-07-13 DIAGNOSIS — N898 Other specified noninflammatory disorders of vagina: Secondary | ICD-10-CM

## 2017-07-13 DIAGNOSIS — R300 Dysuria: Secondary | ICD-10-CM | POA: Diagnosis not present

## 2017-07-13 DIAGNOSIS — R1031 Right lower quadrant pain: Secondary | ICD-10-CM | POA: Diagnosis not present

## 2017-07-13 DIAGNOSIS — B9689 Other specified bacterial agents as the cause of diseases classified elsewhere: Secondary | ICD-10-CM | POA: Diagnosis not present

## 2017-07-13 LAB — POCT URINALYSIS DIPSTICK
BILIRUBIN UA: NEGATIVE
Glucose, UA: NEGATIVE
Ketones, UA: NEGATIVE
Leukocytes, UA: NEGATIVE
NITRITE UA: NEGATIVE
PH UA: 6.5 (ref 5.0–8.0)
PROTEIN UA: POSITIVE — AB
Spec Grav, UA: 1.01 (ref 1.010–1.025)
UROBILINOGEN UA: 0.2 U/dL

## 2017-07-13 MED ORDER — SECNIDAZOLE 2 G PO PACK: 1.0000 | PACK | Freq: Once | ORAL | 2 refills | Status: AC

## 2017-07-13 NOTE — Progress Notes (Signed)
RGYN patient presents for problem visit today.   CC: abdominal pain and vaginal discharge.  Pt states after intercourse she noticed thick white discharge . Pt also notices a vaginal bump pt states she was unsure if she was having a herpes outbreak so has started valtrex.  Pt  also c/o : right sided abdominal pain.

## 2017-07-14 ENCOUNTER — Other Ambulatory Visit: Payer: Self-pay | Admitting: Obstetrics

## 2017-07-14 LAB — CERVICOVAGINAL ANCILLARY ONLY
BACTERIAL VAGINITIS: POSITIVE — AB
Candida vaginitis: POSITIVE — AB
Chlamydia: NEGATIVE
NEISSERIA GONORRHEA: NEGATIVE
Trichomonas: NEGATIVE

## 2017-07-14 LAB — POCT URINE PREGNANCY: PREG TEST UR: NEGATIVE

## 2017-07-14 NOTE — Progress Notes (Signed)
[name], female   DOB: April 03, 1975, 42 y.o.   MRN: [id number]  Chief Complaint  Patient presents with  . Vaginal Discharge    HPI Catherine Osborne is a 42 y.o. female.  Has malodorous vaginal discharge and lower abdominal pain.  Also has pain with urination. HPI  Past Medical History:  Diagnosis Date  . Allergy   . Asthma   . Headache   . 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 Pneumonia   . Prior miscarriage with pregnancy in first trimester, antepartum   . Vaginal Pap smear, abnormal     Past Surgical History:  Procedure Laterality Date  . CERVICAL CERCLAGE N/A 02/26/2015   Procedure: CERCLAGE CERVICAL;  Surgeon: Frederico Hamman, MD;  Location: [facility];  Service: Gynecology;  Laterality: N/A;  . DILATION AND CURETTAGE OF UTERUS    . DILATION AND EVACUATION N/A 05/22/2012   Procedure: DILATATION AND EVACUATION;  Surgeon: Frederico Hamman, MD;  Location: [facility];  Service: Gynecology;  Laterality: N/A;  . FOOT SURGERY      Family History  Adopted: Yes  Problem Relation Age of Onset  . Bone cancer Mother   . Breast cancer Maternal Grandmother   . Diabetes Unknown   . Hypertension Unknown   . Stroke Unknown   . Heart attack Unknown     Social History Social History   Tobacco Use  . Smoking status: Current Some Day Smoker    Packs/day: 1.00    Years: 20.00    Pack years: 20.00    Types: Cigarettes    Last attempt to quit: 01/05/2014    Years since quitting: 3.5  . Smokeless tobacco: Never Used  . Tobacco comment: marijuana/30 mins ago  Substance Use Topics  . Alcohol use: Yes    Alcohol/week: 0.6 oz    Types: 1 Glasses of wine per week    Comment: rare  . Drug use: Yes    Types: Marijuana    Comment: occasional use    No Known Allergies  Current Outpatient Medications  Medication Sig Dispense Refill  . albuterol (PROVENTIL HFA;VENTOLIN HFA) 108 (90 Base) MCG/ACT inhaler Inhale 1-2 puffs into the lungs every 6 (six) hours as needed for  wheezing or shortness of breath. 1 Inhaler 6  . HYDROcodone-acetaminophen (NORCO) 10-325 MG tablet Take 1 tablet by mouth every 4 (four) hours as needed. 10 tablet 0  . valACYclovir (VALTREX) 500 MG tablet Take 500 mg by mouth 2 (two) times daily.    Marland Kitchen albuterol (PROVENTIL) (2.5 MG/3ML) 0.083% nebulizer solution Take 3 mLs (2.5 mg total) by nebulization every 6 (six) hours as needed for wheezing or shortness of breath. (Patient not taking: Reported on 07/13/2017) 75 mL 12  . azithromycin (ZITHROMAX) 250 MG tablet Take as directed (Patient not taking: Reported on 07/13/2017) 6 tablet 0  . beclomethasone (QVAR REDIHALER) 80 MCG/ACT inhaler Inhale 2 puffs into the lungs 2 (two) times daily. (Patient not taking: Reported on 07/13/2017) 1 Inhaler 2  . metroNIDAZOLE (FLAGYL) 500 MG tablet Take 1 tablet (500 mg total) by mouth 2 (two) times daily. (Patient not taking: Reported on 07/13/2017) 14 tablet 0  . ondansetron (ZOFRAN) 4 MG tablet Take 1 tablet (4 mg total) by mouth every 8 (eight) hours as needed for nausea or vomiting. (Patient not taking: Reported on 07/13/2017) 20 tablet 0  . predniSONE (DELTASONE) 20 MG tablet Take as directed (Patient not taking: Reported on 07/13/2017) 15 tablet 0  No current facility-administered medications for this visit.     Review of Systems Review of Systems Constitutional: negative for fatigue and weight loss Respiratory: negative for cough and wheezing Cardiovascular: negative for chest pain, fatigue and palpitations Gastrointestinal: negative for abdominal pain and change in bowel habits Genitourinary:negative Integument/breast: negative for nipple discharge Musculoskeletal:negative for myalgias Neurological: negative for gait problems and tremors Behavioral/Psych: negative for abusive relationship, depression Endocrine: negative for temperature intolerance      Blood pressure 116/83, pulse 93, weight 142 lb 9.6 oz (64.7 kg), last menstrual period  06/27/2017.  Physical Exam Physical Exam           Alert and no distress Abdomen:  normal findings: no organomegaly, soft, non-tender and no hernia  Pelvis:  External genitalia: normal general appearance Urinary system: urethral meatus normal and bladder without fullness, nontender Vaginal: normal without tenderness, induration or masses Cervix: normal appearance Adnexa: normal bimanual exam Uterus: anteverted and non-tender, normal size    50% of 15 min visit spent on counseling and coordination of care.   Data Reviewed Wet Prep Urinalysis   Assessment and Plan:    1. Vaginal discharge Rx: - Cervicovaginal ancillary only - Secnidazole (SOLOSEC) 2 g PACK; Take 1 packet by mouth once for 1 dose. Mix as directed with yogurt, pudding, applesauce, etc.  Dispense: 1 each; Refill: 2  2. Right lower quadrant abdominal pain Rx: - POCT Urinalysis Dipstick - POCT urine pregnancy - Urine Culture - US PELVIC COMPLETE WITH TRANSVAGINAL; Future  3. Dysuria - urine dip WNL's    Plan    Follow up in 2 weeks  Orders Placed This Encounter  Procedures  . Urine Culture  . US PELVIC COMPLETE WITH TRANSVAGINAL    Standing Status:   Future    Standing Expiration Date:   09/13/2018    Order Specific Question:   Reason for Exam (SYMPTOM  OR DIAGNOSIS REQUIRED)    Answer:   Pelvic pain    Order Specific Question:   Preferred imaging location?    Answer:   [facility]  . POCT Urinalysis Dipstick  . POCT urine pregnancy   Meds ordered this encounter  Medications  . Secnidazole (SOLOSEC) 2 g PACK    Sig: Take 1 packet by mouth once for 1 dose. Mix as directed with yogurt, pudding, applesauce, etc.    Dispense:  1 each    Refill:  2    Shelly Bombard MD 07-13-2017

## 2017-07-14 NOTE — Progress Notes (Signed)
Error

## 2017-07-15 LAB — URINE CULTURE

## 2017-07-20 ENCOUNTER — Ambulatory Visit (HOSPITAL_COMMUNITY): Payer: Medicaid Other | Attending: Obstetrics

## 2017-07-21 ENCOUNTER — Other Ambulatory Visit: Payer: Self-pay | Admitting: Obstetrics

## 2017-07-21 DIAGNOSIS — N760 Acute vaginitis: Principal | ICD-10-CM

## 2017-07-21 DIAGNOSIS — B373 Candidiasis of vulva and vagina: Secondary | ICD-10-CM

## 2017-07-21 DIAGNOSIS — B3731 Acute candidiasis of vulva and vagina: Secondary | ICD-10-CM

## 2017-07-21 DIAGNOSIS — B9689 Other specified bacterial agents as the cause of diseases classified elsewhere: Secondary | ICD-10-CM

## 2017-07-21 MED ORDER — METRONIDAZOLE 500 MG PO TABS: 500.0000 mg | ORAL_TABLET | Freq: Two times a day (BID) | ORAL | 0 refills | Status: DC

## 2017-07-21 MED ORDER — FLUCONAZOLE 150 MG PO TABS: 150.0000 mg | ORAL_TABLET | Freq: Once | ORAL | 0 refills | Status: AC

## 2017-07-24 ENCOUNTER — Telehealth: Payer: Self-pay

## 2017-07-24 NOTE — Telephone Encounter (Signed)
Left message to call about results and rx sent.

## 2017-07-26 ENCOUNTER — Encounter: Payer: Self-pay | Admitting: Podiatry

## 2017-07-26 ENCOUNTER — Ambulatory Visit (INDEPENDENT_AMBULATORY_CARE_PROVIDER_SITE_OTHER): Payer: Medicaid Other | Admitting: Podiatry

## 2017-07-26 DIAGNOSIS — M21622 Bunionette of left foot: Secondary | ICD-10-CM

## 2017-07-26 DIAGNOSIS — D492 Neoplasm of unspecified behavior of bone, soft tissue, and skin: Secondary | ICD-10-CM

## 2017-07-27 ENCOUNTER — Ambulatory Visit: Payer: Medicaid Other | Admitting: Obstetrics

## 2017-07-27 NOTE — Progress Notes (Signed)
Subjective:   [name], female   DOB: 42 y.o.   MRN: [id number]   HPI Patient presents stating that the bottom of the left foot still bothers her at times and she is using crutches because she is scared to walk on it.  Overall though doing fine   ROS      Objective:  Physical Exam  Neurovascular status intact negative Homans sign noted with patient's left fifth metatarsal healing well wound edges well coapted and the plantar incision site healing well with stitches in place with 2 of the stitches still present that were we are unable to get out at this time     Assessment:  Overall doing well with satisfactory healing of the left foot the patient does need to be more active     Plan:  Reviewed x-ray and H&P and at this point stitches are removed except for 2-week could not get out walk about the next several weeks and I advised that is very important that this patient start weightbearing on her foot and at this time I see no reason why she cannot.  Patient promises to do that and she will be seen back for regular visit 4 weeks or earlier if any issues should occur and she will continue elevation compression as needed  X-ray indicates satisfactory resection fifth metatarsal head left with no indications of advanced pathology

## 2017-08-01 ENCOUNTER — Ambulatory Visit: Payer: Medicaid Other | Admitting: Obstetrics

## 2017-08-16 ENCOUNTER — Encounter: Payer: Self-pay | Admitting: Obstetrics

## 2017-08-16 ENCOUNTER — Ambulatory Visit (INDEPENDENT_AMBULATORY_CARE_PROVIDER_SITE_OTHER): Payer: Self-pay | Admitting: Obstetrics

## 2017-08-16 ENCOUNTER — Other Ambulatory Visit (HOSPITAL_COMMUNITY)
Admission: RE | Admit: 2017-08-16 | Discharge: 2017-08-16 | Disposition: A | Discharge status: Home / Self Care | Payer: Medicaid Other | Source: Ambulatory Visit | Attending: Obstetrics | Admitting: Obstetrics

## 2017-08-16 VITALS — BP 114/83 | HR 77

## 2017-08-16 DIAGNOSIS — F32A Depression, unspecified: Secondary | ICD-10-CM

## 2017-08-16 DIAGNOSIS — F329 Major depressive disorder, single episode, unspecified: Secondary | ICD-10-CM

## 2017-08-16 DIAGNOSIS — N760 Acute vaginitis: Secondary | ICD-10-CM

## 2017-08-16 DIAGNOSIS — N898 Other specified noninflammatory disorders of vagina: Secondary | ICD-10-CM | POA: Diagnosis not present

## 2017-08-16 DIAGNOSIS — B9689 Other specified bacterial agents as the cause of diseases classified elsewhere: Secondary | ICD-10-CM

## 2017-08-16 MED ORDER — SERTRALINE HCL 50 MG PO TABS: 50.0000 mg | ORAL_TABLET | Freq: Every day | ORAL | 11 refills | Status: DC

## 2017-08-16 MED ORDER — METRONIDAZOLE 0.75 % VA GEL: 1.0000 | Freq: Two times a day (BID) | VAGINAL | 5 refills | Status: DC

## 2017-08-16 NOTE — Progress Notes (Signed)
RGYN presents for complaint of recurrent BV sx's. Also wants to discuss medication management.of sertraline. Was originally prescribed by Dr.Marshall.   Pt not weighed today due to foot injury/has on boot/cast.

## 2017-08-16 NOTE — Progress Notes (Signed)
[name], female   DOB: [birth year], 42 y.o.   MRN: [id number]  Chief Complaint  Patient presents with  . Vaginal Discharge    HPI Catherine Osborne is a 42 y.o. female.  Recurrent BV. HPI  Past Medical History:  Diagnosis Date  . Allergy   . Asthma   . Headache   . 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 Pneumonia   . Prior miscarriage with pregnancy in first trimester, antepartum   . Vaginal Pap smear, abnormal     Past Surgical History:  Procedure Laterality Date  . CERVICAL CERCLAGE N/A 02/26/2015   Procedure: CERCLAGE CERVICAL;  Surgeon: Frederico Hamman, MD;  Location: [facility];  Service: Gynecology;  Laterality: N/A;  . DILATION AND CURETTAGE OF UTERUS    . DILATION AND EVACUATION N/A 05/22/2012   Procedure: DILATATION AND EVACUATION;  Surgeon: Frederico Hamman, MD;  Location: [facility];  Service: Gynecology;  Laterality: N/A;  . FOOT SURGERY      Family History  Adopted: Yes  Problem Relation Age of Onset  . Bone cancer Mother   . Breast cancer Maternal Grandmother   . Diabetes Unknown   . Hypertension Unknown   . Stroke Unknown   . Heart attack Unknown     Social History Social History   Tobacco Use  . Smoking status: Current Some Day Smoker    Packs/day: 1.00    Years: 20.00    Pack years: 20.00    Types: Cigarettes    Last attempt to quit: 01/05/2014    Years since quitting: 3.6  . Smokeless tobacco: Never Used  . Tobacco comment: marijuana/30 mins ago  Substance Use Topics  . Alcohol use: Yes    Alcohol/week: 0.6 oz    Types: 1 Glasses of wine per week    Comment: rare  . Drug use: Yes    Types: Marijuana    Comment: occasional use    No Known Allergies  Current Outpatient Medications  Medication Sig Dispense Refill  . albuterol (PROVENTIL HFA;VENTOLIN HFA) 108 (90 Base) MCG/ACT inhaler Inhale 1-2 puffs into the lungs every 6 (six) hours as needed for wheezing or shortness of breath. 1 Inhaler 6  . valACYclovir (VALTREX) 500 MG  tablet Take 500 mg by mouth 2 (two) times daily.    Marland Kitchen albuterol (PROVENTIL) (2.5 MG/3ML) 0.083% nebulizer solution Take 3 mLs (2.5 mg total) by nebulization every 6 (six) hours as needed for wheezing or shortness of breath. (Patient not taking: Reported on 07/13/2017) 75 mL 12  . azithromycin (ZITHROMAX) 250 MG tablet Take as directed (Patient not taking: Reported on 07/13/2017) 6 tablet 0  . beclomethasone (QVAR REDIHALER) 80 MCG/ACT inhaler Inhale 2 puffs into the lungs 2 (two) times daily. (Patient not taking: Reported on 07/13/2017) 1 Inhaler 2  . HYDROcodone-acetaminophen (NORCO) 10-325 MG tablet Take 1 tablet by mouth every 4 (four) hours as needed. 10 tablet 0  . metroNIDAZOLE (FLAGYL) 500 MG tablet Take 1 tablet (500 mg total) by mouth 2 (two) times daily. (Patient not taking: Reported on 08/16/2017) 14 tablet 0  . metroNIDAZOLE (METROGEL VAGINAL) 0.75 % vaginal gel Place 1 Applicatorful vaginally 2 (two) times daily. 70 g 5  . ondansetron (ZOFRAN) 4 MG tablet Take 1 tablet (4 mg total) by mouth every 8 (eight) hours as needed for nausea or vomiting. (Patient not taking: Reported on 07/13/2017) 20 tablet 0  . predniSONE (DELTASONE) 20 MG tablet Take as directed (Patient not taking:  Reported on 07/13/2017) 15 tablet 0  . sertraline (ZOLOFT) 50 MG tablet Take 1 tablet (50 mg total) by mouth daily. 30 tablet 11   No current facility-administered medications for this visit.     Review of Systems Review of Systems Constitutional: negative for fatigue and weight loss Respiratory: negative for cough and wheezing Cardiovascular: negative for chest pain, fatigue and palpitations Gastrointestinal: negative for abdominal pain and change in bowel habits Genitourinary:POSITIVE for recurrent BV Integument/breast: negative for nipple discharge Musculoskeletal:negative for myalgias Neurological: negative for gait problems and tremors Behavioral/Psych: negative for abusive relationship,  depression Endocrine: negative for temperature intolerance      Blood pressure 114/83, pulse 77, last menstrual period 07/24/2017.  Physical Exam Physical Exam           General:  Alert and no distress Abdomen:  normal findings: no organomegaly, soft, non-tender and no hernia  Pelvis:  External genitalia: normal general appearance Urinary system: urethral meatus normal and bladder without fullness, nontender Vaginal: normal without tenderness, induration or masses Cervix: normal appearance Adnexa: normal bimanual exam Uterus: anteverted and non-tender, normal size    50% of 15 min visit spent on counseling and coordination of care.   Data Reviewed Wet Prep Cultures  Assessment     1. Vaginal discharge Rx: - Cervicovaginal ancillary only  2. BV (bacterial vaginosis) Rx: - metroNIDAZOLE (METROGEL VAGINAL) 0.75 % vaginal gel; Place 1 Applicatorful vaginally 2 (two) times daily.  Dispense: 70 g; Refill: 5  3. Depressive disorder Rx: - sertraline (ZOLOFT) 50 MG tablet; Take 1 tablet (50 mg total) by mouth daily.  Dispense: 30 tablet; Refill: 11 Plan    Follow up prn  No orders of the defined types were placed in this encounter.  Meds ordered this encounter  Medications  . sertraline (ZOLOFT) 50 MG tablet    Sig: Take 1 tablet (50 mg total) by mouth daily.    Dispense:  30 tablet    Refill:  11  . metroNIDAZOLE (METROGEL VAGINAL) 0.75 % vaginal gel    Sig: Place 1 Applicatorful vaginally 2 (two) times daily.    Dispense:  70 g    Refill:  5     CHARLES A.HARPER MD 08-16-2017

## 2017-08-17 ENCOUNTER — Other Ambulatory Visit: Payer: Self-pay | Admitting: Obstetrics

## 2017-08-17 DIAGNOSIS — B3731 Acute candidiasis of vulva and vagina: Secondary | ICD-10-CM

## 2017-08-17 DIAGNOSIS — B373 Candidiasis of vulva and vagina: Secondary | ICD-10-CM

## 2017-08-17 LAB — CERVICOVAGINAL ANCILLARY ONLY
BACTERIAL VAGINITIS: POSITIVE — AB
Candida vaginitis: POSITIVE — AB
Chlamydia: NEGATIVE
NEISSERIA GONORRHEA: NEGATIVE
TRICH (WINDOWPATH): NEGATIVE

## 2017-08-17 MED ORDER — FLUCONAZOLE 150 MG PO TABS: 150.0000 mg | ORAL_TABLET | Freq: Once | ORAL | 0 refills | Status: AC

## 2017-08-30 ENCOUNTER — Encounter: Payer: Self-pay | Admitting: Podiatry

## 2017-08-30 ENCOUNTER — Ambulatory Visit (INDEPENDENT_AMBULATORY_CARE_PROVIDER_SITE_OTHER): Payer: Self-pay | Admitting: Podiatry

## 2017-08-30 ENCOUNTER — Ambulatory Visit (INDEPENDENT_AMBULATORY_CARE_PROVIDER_SITE_OTHER): Payer: Medicaid Other

## 2017-08-30 VITALS — BP 95/60 | HR 67 | Resp 16

## 2017-08-30 DIAGNOSIS — M21622 Bunionette of left foot: Secondary | ICD-10-CM | POA: Diagnosis not present

## 2017-08-30 DIAGNOSIS — Z9889 Other specified postprocedural states: Secondary | ICD-10-CM

## 2017-08-31 ENCOUNTER — Telehealth: Payer: Self-pay | Admitting: *Deleted

## 2017-08-31 ENCOUNTER — Encounter: Payer: Self-pay | Admitting: *Deleted

## 2017-08-31 NOTE — Telephone Encounter (Signed)
Faxed referral, and demographics to [facility]. Mailed copy of referral with letter instructing pt to contact [facility] to schedule appts and to contact our office with current demographic and contact phone numbers.

## 2017-09-05 NOTE — Progress Notes (Signed)
   Subjective:  Patient presents today status post 5th metatarsal head resection. DOS: 07/04/17. She states someone stepped on the foot a couple of weeks ago and she is still having some pain when she showers. She has been taking Vicodin 10/325 mg and is requesting a refill. Patient is here for further evaluation and treatment.    Past Medical History:  Diagnosis Date  . Allergy   . Asthma   . Headache   . 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 Pneumonia   . Prior miscarriage with pregnancy in first trimester, antepartum   . Vaginal Pap smear, abnormal       Objective/Physical Exam Neurovascular status intact.  Skin incisions appear to be well coapted with sutures and staples intact. No sign of infectious process noted. No dehiscence. No active bleeding noted. Moderate edema noted to the surgical extremity.  Radiographic Exam:  Osteotomies sites appear to be stable with routine healing.  Assessment: 1. s/p 5th metatarsal head resection. DOS: 07/04/17   Plan of Care:  1. Patient was evaluated. X-rays reviewed 2. Sutures removed.  3. Dry sterile dressing applied.  4. Discontinue using post op shoe. Recommended good sneakers.  5. Order for physical therapy placed for three times weekly for 4 weeks.  6. Return to clinic in 4 weeks.    Edrick Kins, DPM [facility]  Dr. Edrick Kins, [address]                                        [place], [place] [address]                Office [phone]  Fax [phone]

## 2017-09-06 ENCOUNTER — Encounter: Payer: Self-pay | Admitting: Podiatry

## 2017-09-06 ENCOUNTER — Ambulatory Visit (INDEPENDENT_AMBULATORY_CARE_PROVIDER_SITE_OTHER): Payer: Medicaid Other | Admitting: Podiatry

## 2017-09-06 ENCOUNTER — Ambulatory Visit: Payer: Medicaid Other

## 2017-09-06 DIAGNOSIS — M778 Other enthesopathies, not elsewhere classified: Secondary | ICD-10-CM

## 2017-09-06 DIAGNOSIS — M7752 Other enthesopathy of left foot: Secondary | ICD-10-CM | POA: Diagnosis not present

## 2017-09-06 DIAGNOSIS — M779 Enthesopathy, unspecified: Principal | ICD-10-CM

## 2017-09-06 DIAGNOSIS — Z87828 Personal history of other (healed) physical injury and trauma: Secondary | ICD-10-CM

## 2017-09-06 DIAGNOSIS — Z9889 Other specified postprocedural states: Secondary | ICD-10-CM

## 2017-09-07 NOTE — Progress Notes (Signed)
Subjective:   [name], female   DOB: 42 y.o.   MRN: [id number]   HPI Patient states her foot got stepped on 2 weeks ago and still been painful and throbbing and she is just concerned about it but overall she is healing very well from her surgery   ROS      Objective:  Physical Exam  Neurovascular status intact with patient's left foot overall doing well with some thickness of the plantar incision with crusted tissue but no drainage with mild swelling of the left forefoot     Assessment:  Traumatized left foot from 2 weeks ago with healing surgical sites left     Plan:  I discussed continued immobilization and I did debride some plantar tissue with no drainage noted and she will be seen back 4 weeks or earlier if needed

## 2017-09-12 ENCOUNTER — Encounter: Payer: Self-pay | Admitting: Obstetrics

## 2017-09-12 ENCOUNTER — Other Ambulatory Visit (HOSPITAL_COMMUNITY)
Admission: RE | Admit: 2017-09-12 | Discharge: 2017-09-12 | Disposition: A | Discharge status: Home / Self Care | Payer: Medicaid Other | Source: Ambulatory Visit | Attending: Obstetrics | Admitting: Obstetrics

## 2017-09-12 ENCOUNTER — Ambulatory Visit: Payer: Medicaid Other | Admitting: Obstetrics

## 2017-09-12 VITALS — BP 125/86 | HR 92 | Ht 62.0 in | Wt 130.4 lb

## 2017-09-12 DIAGNOSIS — N898 Other specified noninflammatory disorders of vagina: Secondary | ICD-10-CM | POA: Insufficient documentation

## 2017-09-12 DIAGNOSIS — N949 Unspecified condition associated with female genital organs and menstrual cycle: Secondary | ICD-10-CM

## 2017-09-12 DIAGNOSIS — Z113 Encounter for screening for infections with a predominantly sexual mode of transmission: Secondary | ICD-10-CM | POA: Diagnosis not present

## 2017-09-12 NOTE — Progress Notes (Signed)
[name], female   DOB: 03-09-1975, 42 y.o.   MRN: [id number]  Chief Complaint  Patient presents with  . GYN    HPI Catherine Osborne is a 42 y.o. female.  Vaginal burning after intercourse.  Partner is "leaking" from penis. HPI  Past Medical History:  Diagnosis Date  . Allergy   . Asthma   . Headache   . 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 Pneumonia   . Prior miscarriage with pregnancy in first trimester, antepartum   . Vaginal Pap smear, abnormal     Past Surgical History:  Procedure Laterality Date  . CERVICAL CERCLAGE N/A 02/26/2015   Procedure: CERCLAGE CERVICAL;  Surgeon: Frederico Hamman, MD;  Location: [facility];  Service: Gynecology;  Laterality: N/A;  . DILATION AND CURETTAGE OF UTERUS    . DILATION AND EVACUATION N/A 05/22/2012   Procedure: DILATATION AND EVACUATION;  Surgeon: Frederico Hamman, MD;  Location: [facility];  Service: Gynecology;  Laterality: N/A;  . FOOT SURGERY      Family History  Adopted: Yes  Problem Relation Age of Onset  . Bone cancer Mother   . Breast cancer Maternal Grandmother   . Diabetes Unknown   . Hypertension Unknown   . Stroke Unknown   . Heart attack Unknown     Social History Social History   Tobacco Use  . Smoking status: Current Some Day Smoker    Packs/day: 1.00    Years: 20.00    Pack years: 20.00    Types: Cigarettes    Last attempt to quit: 01/05/2014    Years since quitting: 3.6  . Smokeless tobacco: Never Used  . Tobacco comment: marijuana/30 mins ago  Substance Use Topics  . Alcohol use: Yes    Alcohol/week: 1.0 standard drinks    Types: 1 Glasses of wine per week    Comment: rare  . Drug use: Yes    Types: Marijuana    Comment: occasional use    No Known Allergies  Current Outpatient Medications  Medication Sig Dispense Refill  . albuterol (PROVENTIL HFA;VENTOLIN HFA) 108 (90 Base) MCG/ACT inhaler Inhale 1-2 puffs into the lungs every 6 (six) hours as needed for wheezing or shortness of  breath. (Patient not taking: Reported on 09/12/2017) 1 Inhaler 6  . albuterol (PROVENTIL) (2.5 MG/3ML) 0.083% nebulizer solution Take 3 mLs (2.5 mg total) by nebulization every 6 (six) hours as needed for wheezing or shortness of breath. (Patient not taking: Reported on 09/12/2017) 75 mL 12  . azithromycin (ZITHROMAX) 250 MG tablet Take as directed (Patient not taking: Reported on 09/12/2017) 6 tablet 0  . beclomethasone (QVAR REDIHALER) 80 MCG/ACT inhaler Inhale 2 puffs into the lungs 2 (two) times daily. (Patient not taking: Reported on 09/12/2017) 1 Inhaler 2  . HYDROcodone-acetaminophen (NORCO) 10-325 MG tablet Take 1 tablet by mouth every 4 (four) hours as needed. (Patient not taking: Reported on 09/12/2017) 10 tablet 0  . metroNIDAZOLE (FLAGYL) 500 MG tablet Take 1 tablet (500 mg total) by mouth 2 (two) times daily. (Patient not taking: Reported on 09/12/2017) 14 tablet 0  . metroNIDAZOLE (METROGEL VAGINAL) 0.75 % vaginal gel Place 1 Applicatorful vaginally 2 (two) times daily. (Patient not taking: Reported on 09/12/2017) 70 g 5  . ondansetron (ZOFRAN) 4 MG tablet Take 1 tablet (4 mg total) by mouth every 8 (eight) hours as needed for nausea or vomiting. (Patient not taking: Reported on 09/12/2017) 20 tablet 0  . predniSONE (DELTASONE) 20  MG tablet Take as directed (Patient not taking: Reported on 09/12/2017) 15 tablet 0  . sertraline (ZOLOFT) 50 MG tablet Take 1 tablet (50 mg total) by mouth daily. (Patient not taking: Reported on 09/12/2017) 30 tablet 11  . valACYclovir (VALTREX) 500 MG tablet Take 500 mg by mouth 2 (two) times daily.     No current facility-administered medications for this visit.     Review of Systems Review of Systems Constitutional: negative for fatigue and weight loss Respiratory: negative for cough and wheezing Cardiovascular: negative for chest pain, fatigue and palpitations Gastrointestinal: negative for abdominal pain and change in bowel habits Genitourinary:POSITIVE for  vaginal burning after intercourse Integument/breast: negative for nipple discharge Musculoskeletal:negative for myalgias Neurological: negative for gait problems and tremors Behavioral/Psych: negative for abusive relationship, depression Endocrine: negative for temperature intolerance      Blood pressure 125/86, pulse 92, height 5\' 2"  (1.575 m), weight 130 lb 6.4 oz (59.1 kg), last menstrual period 09/12/2017.  Physical Exam Physical Exam           General:  Alert and no distress Abdomen:  normal findings: no organomegaly, soft, non-tender and no hernia  Pelvis:  External genitalia: normal general appearance Urinary system: urethral meatus normal and bladder without fullness, nontender Vaginal: normal without tenderness, induration or masses Cervix: normal appearance Adnexa: normal bimanual exam Uterus: anteverted and non-tender, normal size    50% of 15 min visit spent on counseling and coordination of care.   Data Reviewed Wet Prep Cultures  Assessment     1. Vaginal discharge Rx: - Cervicovaginal ancillary only  2. Vaginal burning  3. Screen for STD (sexually transmitted disease) Rx: - HIV antibody (with reflex) - RPR - Hepatitis B Surface AntiGEN - Hepatitis C Antibody    Plan    Orders Placed This Encounter  Procedures  . HIV antibody (with reflex)  . RPR  . Hepatitis B Surface AntiGEN  . Hepatitis C Antibody   No orders of the defined types were placed in this encounter.     Shelly Bombard MD 09-12-2017

## 2017-09-12 NOTE — Progress Notes (Signed)
Patient is in the office for std testing. Pt states that her partner told her that he has been "leaking" and she has experienced some burning after intercourse.

## 2017-09-13 ENCOUNTER — Other Ambulatory Visit: Payer: Self-pay | Admitting: Obstetrics

## 2017-09-13 ENCOUNTER — Ambulatory Visit: Payer: Medicaid Other | Admitting: Physical Therapy

## 2017-09-13 DIAGNOSIS — B3731 Acute candidiasis of vulva and vagina: Secondary | ICD-10-CM

## 2017-09-13 DIAGNOSIS — B373 Candidiasis of vulva and vagina: Secondary | ICD-10-CM

## 2017-09-13 LAB — CERVICOVAGINAL ANCILLARY ONLY
Bacterial vaginitis: POSITIVE — AB
Candida vaginitis: POSITIVE — AB
Chlamydia: NEGATIVE
Neisseria Gonorrhea: NEGATIVE
Trichomonas: NEGATIVE

## 2017-09-13 MED ORDER — FLUCONAZOLE 150 MG PO TABS: 150.0000 mg | ORAL_TABLET | Freq: Once | ORAL | 2 refills | Status: AC

## 2017-09-14 ENCOUNTER — Ambulatory Visit (HOSPITAL_COMMUNITY): Admission: RE | Admit: 2017-09-14 | Payer: Medicaid Other | Source: Ambulatory Visit

## 2017-09-15 ENCOUNTER — Telehealth: Payer: Self-pay

## 2017-09-15 ENCOUNTER — Ambulatory Visit (HOSPITAL_COMMUNITY)
Admission: EM | Admit: 2017-09-15 | Discharge: 2017-09-15 | Disposition: A | Discharge status: Home / Self Care | Payer: Medicaid Other | Attending: Family Medicine | Admitting: Family Medicine

## 2017-09-15 ENCOUNTER — Encounter (HOSPITAL_COMMUNITY): Payer: Self-pay

## 2017-09-15 DIAGNOSIS — N898 Other specified noninflammatory disorders of vagina: Secondary | ICD-10-CM

## 2017-09-15 MED ORDER — FLUCONAZOLE 150 MG PO TABS: 150.0000 mg | ORAL_TABLET | Freq: Once | ORAL | 0 refills | Status: AC

## 2017-09-15 NOTE — Telephone Encounter (Signed)
TC from patient requesting results Pt notified and made aware Rx was sent and to continue use of metrogel.

## 2017-09-15 NOTE — ED Triage Notes (Signed)
Pt presents wanting STD testing and treatment after an chlamydia exposure

## 2017-09-15 NOTE — ED Provider Notes (Signed)
[facility]    CSN: [id number] Arrival date & time: 09/15/17  1903     History   Chief Complaint Chief Complaint  Patient presents with  . Exposure to STD    HPI IVIE SAVITT is a 42 y.o. female.   42 year old female comes in for STD testing.  States has had vaginal burning during intercourse and vaginal itching.  No obvious vaginal discharge.  Denies abdominal pain, nausea, vomiting.  Denies fever, chills, night sweats.  Denies urinary symptoms such as frequency, dysuria, hematuria.  LMP 09/12/2017.  States current female partner was told by his past partner to get tested for STD with possible exposure to chlamydia.     Past Medical History:  Diagnosis Date  . Allergy   . Asthma   . Headache   . 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 Pneumonia   . Prior miscarriage with pregnancy in first trimester, antepartum   . Vaginal Pap smear, abnormal     Patient Active Problem List   Diagnosis Date Noted  . Seasonal allergies 01/18/2016  . Cigarette smoker 01/18/2016  . Compliance poor 01/18/2016  . Asthma 12/07/2015  . Goiter diffuse 12/07/2015  . NSVD (normal spontaneous vaginal delivery) 06/13/2015  . Pregnancy induced hypertension 06/12/2015  . Preeclampsia 06/10/2015  . Short cervix affecting pregnancy 02/25/2015  . Advanced maternal age in multigravida 02/12/2015  . Left thyroid nodule 10/25/2011    Past Surgical History:  Procedure Laterality Date  . CERVICAL CERCLAGE N/A 02/26/2015   Procedure: CERCLAGE CERVICAL;  Surgeon: Frederico Hamman, MD;  Location: [facility];  Service: Gynecology;  Laterality: N/A;  . DILATION AND CURETTAGE OF UTERUS    . DILATION AND EVACUATION N/A 05/22/2012   Procedure: DILATATION AND EVACUATION;  Surgeon: Frederico Hamman, MD;  Location: [facility];  Service: Gynecology;  Laterality: N/A;  . FOOT SURGERY      OB History    Gravida  14   Para  2   Term  1   Preterm  1   AB  12   Living  1     SAB  10   TAB  2   Ectopic      Multiple  0   Live Births  1            Home Medications    Prior to Admission medications   Medication Sig Start Date End Date Taking? Authorizing Provider  albuterol (PROVENTIL HFA;VENTOLIN HFA) 108 (90 Base) MCG/ACT inhaler Inhale 1-2 puffs into the lungs every 6 (six) hours as needed for wheezing or shortness of breath. Patient not taking: Reported on 09/12/2017 02/15/17   Noralee Space, MD  albuterol (PROVENTIL) (2.5 MG/3ML) 0.083% nebulizer solution Take 3 mLs (2.5 mg total) by nebulization every 6 (six) hours as needed for wheezing or shortness of breath. Patient not taking: Reported on 09/12/2017 10/08/15   Noe Gens, PA-C  azithromycin [facility]) 250 MG tablet Take as directed Patient not taking: Reported on 09/12/2017 02/15/17   Noralee Space, MD  beclomethasone (QVAR REDIHALER) 80 MCG/ACT inhaler Inhale 2 puffs into the lungs 2 (two) times daily. Patient not taking: Reported on 09/12/2017 02/15/17   Noralee Space, MD  fluconazole (DIFLUCAN) 150 MG tablet Take 1 tablet (150 mg total) by mouth once for 1 dose. 09/15/17 09/15/17  Ok Edwards, PA-C  HYDROcodone-acetaminophen (NORCO) 10-325 MG tablet Take 1 tablet by mouth every 4 (four) hours as needed. Patient not taking: Reported  on 09/12/2017 07/07/17   Wallene Huh, DPM  metroNIDAZOLE (FLAGYL) 500 MG tablet Take 1 tablet (500 mg total) by mouth 2 (two) times daily. Patient not taking: Reported on 09/12/2017 07/21/17   Shelly Bombard, MD  metroNIDAZOLE (METROGEL VAGINAL) 0.75 % vaginal gel Place 1 Applicatorful vaginally 2 (two) times daily. Patient not taking: Reported on 09/12/2017 08/16/17   Shelly Bombard, MD  ondansetron (ZOFRAN) 4 MG tablet Take 1 tablet (4 mg total) by mouth every 8 (eight) hours as needed for nausea or vomiting. Patient not taking: Reported on 09/12/2017 07/07/17   Wallene Huh, DPM  predniSONE (DELTASONE) 20 MG tablet Take as directed Patient not taking: Reported on 09/12/2017 02/15/17    Noralee Space, MD  sertraline (ZOLOFT) 50 MG tablet Take 1 tablet (50 mg total) by mouth daily. Patient not taking: Reported on 09/12/2017 08/16/17   Shelly Bombard, MD  valACYclovir (VALTREX) 500 MG tablet Take 500 mg by mouth 2 (two) times daily.    [provider]    Family History Family History  Adopted: Yes  Problem Relation Age of Onset  . Bone cancer Mother   . Breast cancer Maternal Grandmother   . Diabetes Unknown   . Hypertension Unknown   . Stroke Unknown   . Heart attack Unknown     Social History Social History   Tobacco Use  . Smoking status: Current Some Day Smoker    Packs/day: 1.00    Years: 20.00    Pack years: 20.00    Types: Cigarettes    Last attempt to quit: 01/05/2014    Years since quitting: 3.6  . Smokeless tobacco: Never Used  . Tobacco comment: marijuana/30 mins ago  Substance Use Topics  . Alcohol use: Yes    Alcohol/week: 1.0 standard drinks    Types: 1 Glasses of wine per week    Comment: rare  . Drug use: Yes    Types: Marijuana    Comment: occasional use     Allergies   Patient has no known allergies.   Review of Systems Review of Systems  Reason unable to perform ROS: See HPI as above.     Physical Exam Triage Vital Signs ED Triage Vitals  Enc Vitals Group     BP 09/15/17 1927 120/81     Pulse Rate 09/15/17 1927 86     Resp 09/15/17 1927 20     Temp 09/15/17 1927 99 F (37.2 C)     Temp Source 09/15/17 1927 Oral     SpO2 09/15/17 1927 96 %     Weight --      Height --      Head Circumference --      Peak Flow --      Pain Score 09/15/17 1924 0     Pain Loc --      Pain Edu? --      Excl. in [place]? --    No data found.  Updated Vital Signs BP 120/81 (BP Location: Left Arm)   Pulse 86   Temp 99 F (37.2 C) (Oral)   Resp 20   LMP 09/12/2017   SpO2 96%   Physical Exam  Constitutional: She is oriented to person, place, and time. She appears well-developed and well-nourished. No distress.  HENT:    Head: Normocephalic and atraumatic.  Eyes: Pupils are equal, round, and reactive to light. Conjunctivae are normal.  Cardiovascular: Normal rate, regular rhythm and normal heart sounds. Exam  reveals no gallop and no friction rub.  No murmur heard. Pulmonary/Chest: Effort normal and breath sounds normal. She has no wheezes. She has no rales.  Abdominal: Soft. Bowel sounds are normal. She exhibits no mass. There is no tenderness. There is no rebound, no guarding and no CVA tenderness.  Neurological: She is alert and oriented to person, place, and time.  Skin: Skin is warm and dry.  Psychiatric: She has a normal mood and affect. Her behavior is normal. Judgment normal.     UC Treatments / Results  Labs (all labs ordered are listed, but only abnormal results are displayed) Labs Reviewed - No data to display  EKG None  Radiology No results found.  Procedures Procedures (including critical care time)  Medications Ordered in UC Medications - No data to display  Initial Impression / Assessment and Plan / UC Course  I have reviewed the triage vital signs and the nursing notes.  Pertinent labs & imaging results that were available during my care of the patient were reviewed by me and considered in my medical decision making (see chart for details).    During chart review, patient saw her GYN 3 days ago, and had STD testing.  Cytology showed positive BV, yeast.  Negative gonorrhea, chlamydia, trichomonas.  Patient states she was given MetroGel for BV, and did take 1 dose of Diflucan today.  States does not have a second pill.  Will provide second pill of Diflucan, patient can take if symptoms not improving in 3 days.  Return precautions given.  Otherwise follow-up with GYN for further evaluation and management needed.  Patient expresses understanding and agrees to plan.  Final Clinical Impressions(s) / UC Diagnoses   Final diagnoses:  Vaginal irritation    ED Prescriptions     Medication Sig Dispense Auth. Provider   fluconazole (DIFLUCAN) 150 MG tablet Take 1 tablet (150 mg total) by mouth once for 1 dose. 1 tablet Tobin Chad, [place] 09/15/17 2013

## 2017-09-15 NOTE — Discharge Instructions (Signed)
STD results from your GYN showed negative chlamydia, gonorrhea, trichomonas. Start metrogel as directed per your GYN for bacterial vaginitis. I have called in another diflucan pill, if symptoms not improving in 72 hours, you can fill to take a second dose. Refrain from sexual activity and alcohol use for the next 7 days. Monitor for any worsening of symptoms, fever, abdominal pain, nausea, vomiting, to follow up for reevaluation. Otherwise, follow up with GYN for further evaluation and management needed.

## 2017-09-18 ENCOUNTER — Ambulatory Visit: Payer: Medicaid Other | Admitting: Physical Therapy

## 2017-10-04 ENCOUNTER — Ambulatory Visit (INDEPENDENT_AMBULATORY_CARE_PROVIDER_SITE_OTHER): Payer: Medicaid Other | Admitting: Podiatry

## 2017-10-04 ENCOUNTER — Encounter: Payer: Self-pay | Admitting: Podiatry

## 2017-10-04 DIAGNOSIS — M7752 Other enthesopathy of left foot: Secondary | ICD-10-CM

## 2017-10-04 DIAGNOSIS — M778 Other enthesopathies, not elsewhere classified: Secondary | ICD-10-CM

## 2017-10-04 DIAGNOSIS — M779 Enthesopathy, unspecified: Principal | ICD-10-CM

## 2017-10-04 DIAGNOSIS — Z9889 Other specified postprocedural states: Secondary | ICD-10-CM

## 2017-10-04 MED ORDER — MELOXICAM 15 MG PO TABS: 15.0000 mg | ORAL_TABLET | Freq: Every day | ORAL | 2 refills | Status: DC

## 2017-10-05 ENCOUNTER — Telehealth: Payer: Self-pay

## 2017-10-05 NOTE — Telephone Encounter (Signed)
Returned call, patient reports burning with urination, frequent urination, and thinks possible uti. Advised pt to come to office tomorrow to leave urine sample.

## 2017-10-05 NOTE — Progress Notes (Signed)
Subjective:   [name], female   DOB: 42 y.o.   MRN: [id number]   HPI Patient states she is improving quite a bit with just occasional achiness but the lesion seems to have resolved currently   ROS      Objective:  Physical Exam  Neurovascular status intact with patient noted to have a well-healed surgical site left fifth metatarsal with wound edges well coapted and no breakdown of tissue noted with minimal discomfort when pressed     Assessment:  Continues to show improvement from fifth metatarsal head resection left     Plan:  Reviewed condition and recommended the continuation of conservative care compression therapy and elevation.  Patient will be seen back for [place] to recheck as needed  X-ray indicates satisfactory resection of fifth metatarsal head left

## 2017-10-06 ENCOUNTER — Ambulatory Visit: Payer: Medicaid Other

## 2017-10-09 ENCOUNTER — Ambulatory Visit (INDEPENDENT_AMBULATORY_CARE_PROVIDER_SITE_OTHER): Payer: Medicaid Other

## 2017-10-09 VITALS — BP 114/81 | HR 82 | Temp 98.8°F

## 2017-10-09 DIAGNOSIS — R300 Dysuria: Secondary | ICD-10-CM | POA: Diagnosis not present

## 2017-10-09 LAB — POCT URINALYSIS DIPSTICK
Bilirubin, UA: NEGATIVE
Glucose, UA: NEGATIVE
Nitrite, UA: NEGATIVE
Protein, UA: POSITIVE — AB
Spec Grav, UA: 1.02
Urobilinogen, UA: 0.2 U/dL
pH, UA: 7

## 2017-10-09 MED ORDER — SULFAMETHOXAZOLE-TRIMETHOPRIM 800-160 MG PO TABS: 1.0000 | ORAL_TABLET | Freq: Two times a day (BID) | ORAL | 0 refills | Status: AC

## 2017-10-09 NOTE — Progress Notes (Signed)
Pt is here with c/o dysuria. Urinalysis and culture sent. Advised pt of medication sent to pharmacy and let her know we would notify her of urine culture results. Pt verbalized understanding.

## 2017-10-11 LAB — URINE CULTURE

## 2017-11-01 ENCOUNTER — Encounter (HOSPITAL_COMMUNITY): Payer: Self-pay

## 2017-11-01 ENCOUNTER — Ambulatory Visit (HOSPITAL_COMMUNITY)
Admission: EM | Admit: 2017-11-01 | Discharge: 2017-11-01 | Disposition: A | Discharge status: Home / Self Care | Payer: Medicaid Other | Attending: Family Medicine | Admitting: Family Medicine

## 2017-11-01 DIAGNOSIS — Z7951 Long term (current) use of inhaled steroids: Secondary | ICD-10-CM | POA: Diagnosis not present

## 2017-11-01 DIAGNOSIS — F17210 Nicotine dependence, cigarettes, uncomplicated: Secondary | ICD-10-CM | POA: Insufficient documentation

## 2017-11-01 DIAGNOSIS — J029 Acute pharyngitis, unspecified: Secondary | ICD-10-CM | POA: Diagnosis not present

## 2017-11-01 DIAGNOSIS — J45909 Unspecified asthma, uncomplicated: Secondary | ICD-10-CM | POA: Diagnosis not present

## 2017-11-01 DIAGNOSIS — Z791 Long term (current) use of non-steroidal anti-inflammatories (NSAID): Secondary | ICD-10-CM | POA: Diagnosis not present

## 2017-11-01 DIAGNOSIS — Z79899 Other long term (current) drug therapy: Secondary | ICD-10-CM | POA: Insufficient documentation

## 2017-11-01 LAB — POCT RAPID STREP A: STREPTOCOCCUS, GROUP A SCREEN (DIRECT): NEGATIVE

## 2017-11-01 NOTE — ED Triage Notes (Signed)
Pt presents with sore throat. 

## 2017-11-01 NOTE — ED Provider Notes (Signed)
[place]    CSN: [id number] Arrival date & time: 11/01/17  1010     History   Chief Complaint Chief Complaint  Patient presents with  . Sore Throat    HPI Catherine Osborne is a 42 y.o. female.   Patient is a 42 year old female that presents with mild sore throat.  Symptoms have been constant and remain the same for the past 2 days.  She is concerned because her son tested positive for strep throat.  She denies any associated URI symptoms.  She denies any fever, chills, body aches, fatigue.  ROS per HPI      Sore Throat     Past Medical History:  Diagnosis Date  . Allergy   . Asthma   . Headache   . 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 Pneumonia   . Prior miscarriage with pregnancy in first trimester, antepartum   . Vaginal Pap smear, abnormal     Patient Active Problem List   Diagnosis Date Noted  . Seasonal allergies 01/18/2016  . Cigarette smoker 01/18/2016  . Compliance poor 01/18/2016  . Asthma 12/07/2015  . Goiter diffuse 12/07/2015  . NSVD (normal spontaneous vaginal delivery) 06/13/2015  . Pregnancy induced hypertension 06/12/2015  . Preeclampsia 06/10/2015  . Short cervix affecting pregnancy 02/25/2015  . Advanced maternal age in multigravida 02/12/2015  . Left thyroid nodule 10/25/2011    Past Surgical History:  Procedure Laterality Date  . CERVICAL CERCLAGE N/A 02/26/2015   Procedure: CERCLAGE CERVICAL;  Surgeon: Frederico Hamman, MD;  Location: [facility];  Service: Gynecology;  Laterality: N/A;  . DILATION AND CURETTAGE OF UTERUS    . DILATION AND EVACUATION N/A 05/22/2012   Procedure: DILATATION AND EVACUATION;  Surgeon: Frederico Hamman, MD;  Location: [facility];  Service: Gynecology;  Laterality: N/A;  . FOOT SURGERY      OB History    Gravida  14   Para  2   Term  1   Preterm  1   AB  12   Living  1     SAB  10   TAB  2   Ectopic      Multiple  0   Live Births  1            Home Medications    Prior  to Admission medications   Medication Sig Start Date End Date Taking? Authorizing Provider  albuterol (PROVENTIL HFA;VENTOLIN HFA) 108 (90 Base) MCG/ACT inhaler Inhale 1-2 puffs into the lungs every 6 (six) hours as needed for wheezing or shortness of breath. 02/15/17   Noralee Space, MD  albuterol (PROVENTIL) (2.5 MG/3ML) 0.083% nebulizer solution Take 3 mLs (2.5 mg total) by nebulization every 6 (six) hours as needed for wheezing or shortness of breath. 10/08/15   Noe Gens, PA-C  azithromycin (ZITHROMAX) 250 MG tablet Take as directed 02/15/17   Noralee Space, MD  beclomethasone (QVAR REDIHALER) 80 MCG/ACT inhaler Inhale 2 puffs into the lungs 2 (two) times daily. 02/15/17   Noralee Space, MD  HYDROcodone-acetaminophen (NORCO) 10-325 MG tablet Take 1 tablet by mouth every 4 (four) hours as needed. 07/07/17   Wallene Huh, DPM  meloxicam (MOBIC) 15 MG tablet Take 1 tablet (15 mg total) by mouth daily. 10/04/17   Wallene Huh, DPM  metroNIDAZOLE (FLAGYL) 500 MG tablet Take 1 tablet (500 mg total) by mouth 2 (two) times daily. 07/21/17   Shelly Bombard, MD  metroNIDAZOLE (METROGEL  VAGINAL) 0.75 % vaginal gel Place 1 Applicatorful vaginally 2 (two) times daily. 08/16/17   Shelly Bombard, MD  ondansetron (ZOFRAN) 4 MG tablet Take 1 tablet (4 mg total) by mouth every 8 (eight) hours as needed for nausea or vomiting. 07/07/17   Wallene Huh, DPM  predniSONE (DELTASONE) 20 MG tablet Take as directed 02/15/17   Noralee Space, MD  sertraline (ZOLOFT) 50 MG tablet Take 1 tablet (50 mg total) by mouth daily. 08/16/17   Shelly Bombard, MD  valACYclovir (VALTREX) 500 MG tablet Take 500 mg by mouth 2 (two) times daily.    [provider]    Family History Family History  Adopted: Yes  Problem Relation Age of Onset  . Bone cancer Mother   . Breast cancer Maternal Grandmother   . Diabetes Unknown   . Hypertension Unknown   . Stroke Unknown   . Heart attack Unknown     Social  History Social History   Tobacco Use  . Smoking status: Current Some Day Smoker    Packs/day: 1.00    Years: 20.00    Pack years: 20.00    Types: Cigarettes    Last attempt to quit: 01/05/2014    Years since quitting: 3.8  . Smokeless tobacco: Never Used  . Tobacco comment: marijuana/30 mins ago  Substance Use Topics  . Alcohol use: Yes    Alcohol/week: 1.0 standard drinks    Types: 1 Glasses of wine per week    Comment: rare  . Drug use: Yes    Types: Marijuana    Comment: occasional use     Allergies   Patient has no known allergies.   Review of Systems Review of Systems   Physical Exam Triage Vital Signs ED Triage Vitals [11/01/17 1113]  Enc Vitals Group     BP 117/82     Pulse Rate 72     Resp 18     Temp 98.4 F (36.9 C)     Temp Source Oral     SpO2 100 %     Weight      Height      Head Circumference      Peak Flow      Pain Score      Pain Loc      Pain Edu?      Excl. in [place]?    No data found.  Updated Vital Signs BP 117/82 (BP Location: Left Arm)   Pulse 72   Temp 98.4 F (36.9 C) (Oral)   Resp 18   LMP 10/06/2017 (Exact Date)   SpO2 100%   Visual Acuity Right Eye Distance:   Left Eye Distance:   Bilateral Distance:    Right Eye Near:   Left Eye Near:    Bilateral Near:     Physical Exam  Constitutional: She appears well-developed and well-nourished.  Very pleasant. Non toxic or ill appearing.     HENT:  Head: Normocephalic and atraumatic.  Bilateral TMs normal.  External ears normal.  Without posterior oropharyngeal erythema, tonsillar swelling or exudates. No lesions.  No lymphadenopathy.     Neck: Normal range of motion.  Cardiovascular: Normal rate and normal heart sounds.  Pulmonary/Chest: Effort normal and breath sounds normal.  Neurological: She is alert.  Skin: Skin is warm and dry.  Psychiatric: She has a normal mood and affect.  Nursing note and vitals reviewed.    UC Treatments / Results  Labs (all  labs ordered  are listed, but only abnormal results are displayed) Labs Reviewed  CULTURE, GROUP A STREP [facility])  POCT RAPID STREP A    EKG None  Radiology No results found.  Procedures Procedures (including critical care time)  Medications Ordered in UC Medications - No data to display  Initial Impression / Assessment and Plan / UC Course  I have reviewed the triage vital signs and the nursing notes.  Pertinent labs & imaging results that were available during my care of the patient were reviewed by me and considered in my medical decision making (see chart for details).     Testing patient for strep based on exposure history. Most likely negative Will send for culture. Final Clinical Impressions(s) / UC Diagnoses   Final diagnoses:  Sore throat     Discharge Instructions     Rapid strep test was negative We will send for culture and call you with any positive results. Follow up as needed for continued or worsening symptoms      ED Prescriptions    None     Controlled Substance Prescriptions  Controlled Substance Registry consulted? Not Applicable   Orvan July, NP 11/01/17 1159

## 2017-11-01 NOTE — Discharge Instructions (Signed)
Rapid strep test was negative We will send for culture and call you with any positive results. Follow up as needed for continued or worsening symptoms

## 2017-11-03 LAB — CULTURE, GROUP A STREP (THRC)

## 2017-11-13 ENCOUNTER — Encounter: Payer: Self-pay | Admitting: Obstetrics

## 2017-11-13 ENCOUNTER — Other Ambulatory Visit (HOSPITAL_COMMUNITY)
Admission: RE | Admit: 2017-11-13 | Discharge: 2017-11-13 | Disposition: A | Discharge status: Home / Self Care | Payer: Medicaid Other | Source: Ambulatory Visit | Attending: Obstetrics | Admitting: Obstetrics

## 2017-11-13 ENCOUNTER — Other Ambulatory Visit: Payer: Self-pay

## 2017-11-13 ENCOUNTER — Ambulatory Visit (INDEPENDENT_AMBULATORY_CARE_PROVIDER_SITE_OTHER): Payer: Medicaid Other | Admitting: Obstetrics

## 2017-11-13 VITALS — BP 102/68 | HR 72 | Ht 62.0 in | Wt 132.9 lb

## 2017-11-13 DIAGNOSIS — N898 Other specified noninflammatory disorders of vagina: Secondary | ICD-10-CM

## 2017-11-13 DIAGNOSIS — L0293 Carbuncle, unspecified: Secondary | ICD-10-CM

## 2017-11-13 DIAGNOSIS — R52 Pain, unspecified: Secondary | ICD-10-CM

## 2017-11-13 DIAGNOSIS — N760 Acute vaginitis: Secondary | ICD-10-CM | POA: Diagnosis not present

## 2017-11-13 DIAGNOSIS — B373 Candidiasis of vulva and vagina: Secondary | ICD-10-CM

## 2017-11-13 DIAGNOSIS — B9689 Other specified bacterial agents as the cause of diseases classified elsewhere: Secondary | ICD-10-CM | POA: Diagnosis not present

## 2017-11-13 DIAGNOSIS — B3731 Acute candidiasis of vulva and vagina: Secondary | ICD-10-CM

## 2017-11-13 DIAGNOSIS — A6004 Herpesviral vulvovaginitis: Secondary | ICD-10-CM

## 2017-11-13 MED ORDER — AMOXICILLIN-POT CLAVULANATE 875-125 MG PO TABS: 1.0000 | ORAL_TABLET | Freq: Two times a day (BID) | ORAL | 2 refills | Status: DC

## 2017-11-13 MED ORDER — VALACYCLOVIR HCL 500 MG PO TABS: 500.0000 mg | ORAL_TABLET | Freq: Two times a day (BID) | ORAL | 6 refills | Status: DC

## 2017-11-13 MED ORDER — IBUPROFEN 800 MG PO TABS: 800.0000 mg | ORAL_TABLET | Freq: Three times a day (TID) | ORAL | 5 refills | Status: DC | PRN

## 2017-11-13 MED ORDER — FLUCONAZOLE 150 MG PO TABS: 150.0000 mg | ORAL_TABLET | Freq: Once | ORAL | 2 refills | Status: AC

## 2017-11-13 NOTE — Addendum Note (Signed)
Addended by: Baltazar Najjar A on: 11/13/2017 04:19 PM   Modules accepted: Orders

## 2017-11-13 NOTE — Progress Notes (Signed)
GYN presents for 2 boils, one on vagina the other on buttocks x 1-2 weeks, pain 10/10.  C/o malodorous white discharge x 2-3 weeks.  Denies lower abdominal pain, fever, NV or chills.   She took Plan B 2 weeks ago.

## 2017-11-13 NOTE — Progress Notes (Addendum)
[name], female   DOB: [birth year], 42 y.o.   MRN: [id number]  Chief Complaint  Patient presents with  . Recurrent Skin Infections    HPI Catherine Osborne is a 42 y.o. female.  Boils in suprapubic hair and inner-upper thigh.  Vaginal discharge with irritation after taking an antibiotic for a foot infection. HPI  Past Medical History:  Diagnosis Date  . Allergy   . Asthma   . Headache   . 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 Pneumonia   . Prior miscarriage with pregnancy in first trimester, antepartum   . Vaginal Pap smear, abnormal     Past Surgical History:  Procedure Laterality Date  . CERVICAL CERCLAGE N/A 02/26/2015   Procedure: CERCLAGE CERVICAL;  Surgeon: Frederico Hamman, MD;  Location: [facility];  Service: Gynecology;  Laterality: N/A;  . DILATION AND CURETTAGE OF UTERUS    . DILATION AND EVACUATION N/A 05/22/2012   Procedure: DILATATION AND EVACUATION;  Surgeon: Frederico Hamman, MD;  Location: [facility];  Service: Gynecology;  Laterality: N/A;  . FOOT SURGERY      Family History  Adopted: Yes  Problem Relation Age of Onset  . Bone cancer Mother   . Breast cancer Maternal Grandmother   . Diabetes Unknown   . Hypertension Unknown   . Stroke Unknown   . Heart attack Unknown     Social History Social History   Tobacco Use  . Smoking status: Current Some Day Smoker    Packs/day: 1.00    Years: 20.00    Pack years: 20.00    Types: Cigarettes    Last attempt to quit: 01/05/2014    Years since quitting: 3.8  . Smokeless tobacco: Never Used  . Tobacco comment: marijuana/30 mins ago  Substance Use Topics  . Alcohol use: Yes    Alcohol/week: 1.0 standard drinks    Types: 1 Glasses of wine per week    Comment: rare  . Drug use: Yes    Types: Marijuana    Comment: occasional use    No Known Allergies  Current Outpatient Medications  Medication Sig Dispense Refill  . albuterol (PROVENTIL) (2.5 MG/3ML) 0.083% nebulizer solution Take 3 mLs  (2.5 mg total) by nebulization every 6 (six) hours as needed for wheezing or shortness of breath. 75 mL 12  . metroNIDAZOLE (METROGEL VAGINAL) 0.75 % vaginal gel Place 1 Applicatorful vaginally 2 (two) times daily. 70 g 5  . sertraline (ZOLOFT) 50 MG tablet Take 1 tablet (50 mg total) by mouth daily. 30 tablet 11  . valACYclovir (VALTREX) 500 MG tablet Take 1 tablet (500 mg total) by mouth 2 (two) times daily. 30 tablet 6  . albuterol (PROVENTIL HFA;VENTOLIN HFA) 108 (90 Base) MCG/ACT inhaler Inhale 1-2 puffs into the lungs every 6 (six) hours as needed for wheezing or shortness of breath. (Patient not taking: Reported on 11/13/2017) 1 Inhaler 6  . amoxicillin-clavulanate (AUGMENTIN) 875-125 MG tablet Take 1 tablet by mouth 2 (two) times daily. 1 tablet po BID x10 days 20 tablet 2  . azithromycin (ZITHROMAX) 250 MG tablet Take as directed (Patient not taking: Reported on 11/13/2017) 6 tablet 0  . beclomethasone (QVAR REDIHALER) 80 MCG/ACT inhaler Inhale 2 puffs into the lungs 2 (two) times daily. (Patient not taking: Reported on 11/13/2017) 1 Inhaler 2  . fluconazole (DIFLUCAN) 150 MG tablet Take 1 tablet (150 mg total) by mouth once for 1 dose. 1 tablet 2  . HYDROcodone-acetaminophen (NORCO) 10-325 MG  tablet Take 1 tablet by mouth every 4 (four) hours as needed. (Patient not taking: Reported on 11/13/2017) 10 tablet 0  . ibuprofen (ADVIL,MOTRIN) 800 MG tablet Take 1 tablet (800 mg total) by mouth every 8 (eight) hours as needed. 30 tablet 5  . meloxicam (MOBIC) 15 MG tablet Take 1 tablet (15 mg total) by mouth daily. (Patient not taking: Reported on 11/13/2017) 30 tablet 2  . metroNIDAZOLE (FLAGYL) 500 MG tablet Take 1 tablet (500 mg total) by mouth 2 (two) times daily. (Patient not taking: Reported on 11/13/2017) 14 tablet 0  . ondansetron (ZOFRAN) 4 MG tablet Take 1 tablet (4 mg total) by mouth every 8 (eight) hours as needed for nausea or vomiting. (Patient not taking: Reported on 11/13/2017) 20  tablet 0  . predniSONE (DELTASONE) 20 MG tablet Take as directed (Patient not taking: Reported on 11/13/2017) 15 tablet 0   No current facility-administered medications for this visit.     Review of Systems Review of Systems Constitutional: negative for fatigue and weight loss Respiratory: negative for cough and wheezing Cardiovascular: negative for chest pain, fatigue and palpitations Gastrointestinal: negative for abdominal pain and change in bowel habits Genitourinary:positive for vaginal discharge Integument/breast: positive for boils in suprapubic hair and upper-inner thigh areas Musculoskeletal:negative for myalgias Neurological: negative for gait problems and tremors Behavioral/Psych: negative for abusive relationship, depression Endocrine: negative for temperature intolerance      Blood pressure 102/68, pulse 72, height 5\' 2"  (1.575 m), weight 132 lb 14.4 oz (60.3 kg).  Physical Exam Physical Exam           General:  Alert and no distress Abdomen:  normal findings: no organomegaly, soft, non-tender and no hernia  Pelvis:  External genitalia: boil in suprapubic hair Urinary system: urethral meatus normal and bladder without fullness, nontender Vaginal: normal without tenderness, induration or masses Cervix: normal appearance Adnexa: normal bimanual exam Uterus: anteverted and non-tender, normal size    >50% of 15 min visit spent on counseling and coordination of care.   Data Reviewed Wet prep Cultures  Assessment   1. Recurrent boils Rx: - amoxicillin-clavulanate (AUGMENTIN) 875-125 MG tablet; Take 1 tablet by mouth 2 (two) times daily. 1 tablet po BID x10 days  Dispense: 20 tablet; Refill: 2  2. Candida vaginitis Rx: - fluconazole (DIFLUCAN) 150 MG tablet; Take 1 tablet (150 mg total) by mouth once for 1 dose.  Dispense: 1 tablet; Refill: 2  3. Herpes simplex vulvovaginitis Rx: - valACYclovir (VALTREX) 500 MG tablet; Take 1 tablet (500 mg total) by mouth 2  (two) times daily.  Dispense: 30 tablet; Refill: 6  4. Pain Rx: - ibuprofen (ADVIL,MOTRIN) 800 MG tablet; Take 1 tablet (800 mg total) by mouth every 8 (eight) hours as needed.  Dispense: 30 tablet; Refill: 5  5. Vaginal discharge Rx: - Cervicovaginal ancillary only  Plan    Follow up prn   No orders of the defined types were placed in this encounter.  Meds ordered this encounter  Medications  . amoxicillin-clavulanate (AUGMENTIN) 875-125 MG tablet    Sig: Take 1 tablet by mouth 2 (two) times daily. 1 tablet po BID x10 days    Dispense:  20 tablet    Refill:  2  . fluconazole (DIFLUCAN) 150 MG tablet    Sig: Take 1 tablet (150 mg total) by mouth once for 1 dose.    Dispense:  1 tablet    Refill:  2  . valACYclovir (VALTREX) 500 MG tablet  Sig: Take 1 tablet (500 mg total) by mouth 2 (two) times daily.    Dispense:  30 tablet    Refill:  6  . ibuprofen (ADVIL,MOTRIN) 800 MG tablet    Sig: Take 1 tablet (800 mg total) by mouth every 8 (eight) hours as needed.    Dispense:  30 tablet    Refill:  5     Shelly Bombard MD 11-13-2017

## 2017-11-15 ENCOUNTER — Other Ambulatory Visit: Payer: Self-pay | Admitting: Obstetrics

## 2017-11-15 LAB — CERVICOVAGINAL ANCILLARY ONLY
Bacterial vaginitis: POSITIVE — AB
CHLAMYDIA, DNA PROBE: NEGATIVE
Candida vaginitis: NEGATIVE
Neisseria Gonorrhea: NEGATIVE
TRICH (WINDOWPATH): NEGATIVE

## 2017-12-21 ENCOUNTER — Encounter: Payer: Self-pay | Admitting: Obstetrics

## 2017-12-21 ENCOUNTER — Ambulatory Visit (INDEPENDENT_AMBULATORY_CARE_PROVIDER_SITE_OTHER): Payer: Medicaid Other | Admitting: Obstetrics

## 2017-12-21 ENCOUNTER — Other Ambulatory Visit: Payer: Self-pay | Admitting: Obstetrics

## 2017-12-21 VITALS — BP 112/66 | HR 88 | Wt 132.2 lb

## 2017-12-21 DIAGNOSIS — N644 Mastodynia: Secondary | ICD-10-CM

## 2017-12-21 NOTE — Progress Notes (Signed)
Pt is here for pain in R breast. Pt states she did have discharge.

## 2017-12-22 ENCOUNTER — Encounter: Payer: Self-pay | Admitting: Obstetrics

## 2017-12-22 NOTE — Progress Notes (Signed)
[name], female   DOB: [birth year], 42 y.o.   MRN: [id number]  Chief Complaint  Patient presents with  . Breast Pain    HPI Catherine Osborne is a 42 y.o. female.  Pain in right breast.  Denies feeling lumps. HPI  Past Medical History:  Diagnosis Date  . Allergy   . Asthma   . Headache   . 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 Pneumonia   . Prior miscarriage with pregnancy in first trimester, antepartum   . Vaginal Pap smear, abnormal     Past Surgical History:  Procedure Laterality Date  . CERVICAL CERCLAGE N/A 02/26/2015   Procedure: CERCLAGE CERVICAL;  Surgeon: Frederico Hamman, MD;  Location: [facility];  Service: Gynecology;  Laterality: N/A;  . DILATION AND CURETTAGE OF UTERUS    . DILATION AND EVACUATION N/A 05/22/2012   Procedure: DILATATION AND EVACUATION;  Surgeon: Frederico Hamman, MD;  Location: [facility];  Service: Gynecology;  Laterality: N/A;  . FOOT SURGERY      Family History  Adopted: Yes  Problem Relation Age of Onset  . Bone cancer Mother   . Breast cancer Maternal Grandmother   . Diabetes Unknown   . Hypertension Unknown   . Stroke Unknown   . Heart attack Unknown     Social History Social History   Tobacco Use  . Smoking status: Current Some Day Smoker    Packs/day: 1.00    Years: 20.00    Pack years: 20.00    Types: Cigarettes    Last attempt to quit: 01/05/2014    Years since quitting: 3.9  . Smokeless tobacco: Never Used  . Tobacco comment: marijuana/30 mins ago  Substance Use Topics  . Alcohol use: Yes    Alcohol/week: 1.0 standard drinks    Types: 1 Glasses of wine per week    Comment: rare  . Drug use: Yes    Types: Marijuana    Comment: occasional use    No Known Allergies  Current Outpatient Medications  Medication Sig Dispense Refill  . albuterol (PROVENTIL HFA;VENTOLIN HFA) 108 (90 Base) MCG/ACT inhaler Inhale 1-2 puffs into the lungs every 6 (six) hours as needed for wheezing or shortness of breath. 1  Inhaler 6  . albuterol (PROVENTIL) (2.5 MG/3ML) 0.083% nebulizer solution Take 3 mLs (2.5 mg total) by nebulization every 6 (six) hours as needed for wheezing or shortness of breath. 75 mL 12  . fluconazole (DIFLUCAN) 150 MG tablet   2  . ibuprofen (ADVIL,MOTRIN) 800 MG tablet Take 1 tablet (800 mg total) by mouth every 8 (eight) hours as needed. 30 tablet 5  . valACYclovir (VALTREX) 500 MG tablet Take 1 tablet (500 mg total) by mouth 2 (two) times daily. 30 tablet 6  . amoxicillin-clavulanate (AUGMENTIN) 875-125 MG tablet Take 1 tablet by mouth 2 (two) times daily. 1 tablet po BID x10 days (Patient not taking: Reported on 12/21/2017) 20 tablet 2  . azithromycin (ZITHROMAX) 250 MG tablet Take as directed (Patient not taking: Reported on 11/13/2017) 6 tablet 0  . beclomethasone (QVAR REDIHALER) 80 MCG/ACT inhaler Inhale 2 puffs into the lungs 2 (two) times daily. (Patient not taking: Reported on 11/13/2017) 1 Inhaler 2  . HYDROcodone-acetaminophen (NORCO) 10-325 MG tablet Take 1 tablet by mouth every 4 (four) hours as needed. (Patient not taking: Reported on 11/13/2017) 10 tablet 0  . meloxicam (MOBIC) 15 MG tablet Take 1 tablet (15 mg total) by mouth daily. (Patient not taking:  Reported on 11/13/2017) 30 tablet 2  . metroNIDAZOLE (FLAGYL) 500 MG tablet Take 1 tablet (500 mg total) by mouth 2 (two) times daily. (Patient not taking: Reported on 11/13/2017) 14 tablet 0  . metroNIDAZOLE (METROGEL VAGINAL) 0.75 % vaginal gel Place 1 Applicatorful vaginally 2 (two) times daily. (Patient not taking: Reported on 12/21/2017) 70 g 5  . ondansetron (ZOFRAN) 4 MG tablet Take 1 tablet (4 mg total) by mouth every 8 (eight) hours as needed for nausea or vomiting. (Patient not taking: Reported on 11/13/2017) 20 tablet 0  . predniSONE (DELTASONE) 20 MG tablet Take as directed (Patient not taking: Reported on 11/13/2017) 15 tablet 0  . sertraline (ZOLOFT) 50 MG tablet Take 1 tablet (50 mg total) by mouth daily. (Patient  not taking: Reported on 12/21/2017) 30 tablet 11   No current facility-administered medications for this visit.     Review of Systems Review of Systems Constitutional: negative for fatigue and weight loss Respiratory: negative for cough and wheezing Cardiovascular: negative for chest pain, fatigue and palpitations Gastrointestinal: negative for abdominal pain and change in bowel habits Genitourinary:negative Integument/breast: positive for breast tenderness - right breast Musculoskeletal:negative for myalgias Neurological: negative for gait problems and tremors Behavioral/Psych: negative for abusive relationship, depression Endocrine: negative for temperature intolerance      Blood pressure 112/66, pulse 88, weight 132 lb 3.2 oz (60 kg), last menstrual period 11/28/2017.  Physical Exam Physical Exam General:   alert and no distress  Skin:   no rash or abnormalities  Lungs:   clear to auscultation bilaterally  Heart:   regular rate and rhythm, S1, S2 normal, no murmur, click, rub or gallop  Breasts:   tenderness at 9 o'clock.  No masses palpable   50% of 15 min visit spent on counseling and coordination of care.   Data Reviewed None  Assessment     1. Mastodynia of right breast Rx: - MM DIAG BREAST TOMO BILATERAL; Future    Plan    Orders Placed This Encounter  Procedures  . MM DIAG BREAST TOMO BILATERAL    INS MCD COSIGN REQ PF NONE RIGHT BREAST PAIN / MATERNAL GRANDMOTHER BR CA AGE UNK / NO IMPLANTS / NO NEEDS / SW AMEETRA / JR    Standing Status:   Future    Standing Expiration Date:   02/21/2019    Order Specific Question:   Reason for Exam (SYMPTOM  OR DIAGNOSIS REQUIRED)    Answer:   Breast pain - Right breast    Order Specific Question:   Is the patient pregnant?    Answer:   No    Order Specific Question:   Preferred imaging location?    Answer:   [facility]   No orders of the defined types were placed in this encounter.    Shelly Bombard  MD 12-22-2017

## 2017-12-27 ENCOUNTER — Ambulatory Visit
Admission: RE | Admit: 2017-12-27 | Discharge: 2017-12-27 | Disposition: A | Discharge status: Home / Self Care | Payer: Medicaid Other | Source: Ambulatory Visit | Attending: Obstetrics | Admitting: Obstetrics

## 2017-12-27 ENCOUNTER — Other Ambulatory Visit: Payer: Self-pay | Admitting: Obstetrics

## 2017-12-27 DIAGNOSIS — N6320 Unspecified lump in the left breast, unspecified quadrant: Secondary | ICD-10-CM

## 2017-12-27 DIAGNOSIS — N644 Mastodynia: Secondary | ICD-10-CM

## 2018-01-03 ENCOUNTER — Encounter: Payer: Self-pay | Admitting: Obstetrics

## 2018-01-03 ENCOUNTER — Ambulatory Visit: Payer: Medicaid Other | Admitting: Obstetrics

## 2018-01-03 ENCOUNTER — Other Ambulatory Visit (HOSPITAL_COMMUNITY)
Admission: RE | Admit: 2018-01-03 | Discharge: 2018-01-03 | Disposition: A | Discharge status: Home / Self Care | Payer: Medicaid Other | Source: Ambulatory Visit | Attending: Obstetrics | Admitting: Obstetrics

## 2018-01-03 VITALS — BP 100/69 | HR 80 | Wt 133.0 lb

## 2018-01-03 DIAGNOSIS — Z01419 Encounter for gynecological examination (general) (routine) without abnormal findings: Secondary | ICD-10-CM

## 2018-01-03 DIAGNOSIS — B9689 Other specified bacterial agents as the cause of diseases classified elsewhere: Secondary | ICD-10-CM

## 2018-01-03 DIAGNOSIS — N898 Other specified noninflammatory disorders of vagina: Secondary | ICD-10-CM

## 2018-01-03 DIAGNOSIS — F17210 Nicotine dependence, cigarettes, uncomplicated: Secondary | ICD-10-CM

## 2018-01-03 DIAGNOSIS — Z23 Encounter for immunization: Secondary | ICD-10-CM

## 2018-01-03 DIAGNOSIS — B3731 Acute candidiasis of vulva and vagina: Secondary | ICD-10-CM

## 2018-01-03 DIAGNOSIS — Z0000 Encounter for general adult medical examination without abnormal findings: Secondary | ICD-10-CM | POA: Diagnosis not present

## 2018-01-03 DIAGNOSIS — A6004 Herpesviral vulvovaginitis: Secondary | ICD-10-CM

## 2018-01-03 DIAGNOSIS — B373 Candidiasis of vulva and vagina: Secondary | ICD-10-CM

## 2018-01-03 DIAGNOSIS — N764 Abscess of vulva: Secondary | ICD-10-CM

## 2018-01-03 DIAGNOSIS — Z3009 Encounter for other general counseling and advice on contraception: Secondary | ICD-10-CM

## 2018-01-03 DIAGNOSIS — Z113 Encounter for screening for infections with a predominantly sexual mode of transmission: Secondary | ICD-10-CM

## 2018-01-03 DIAGNOSIS — N760 Acute vaginitis: Secondary | ICD-10-CM

## 2018-01-03 MED ORDER — FLUCONAZOLE 150 MG PO TABS: 150.0000 mg | ORAL_TABLET | Freq: Once | ORAL | 2 refills | Status: AC

## 2018-01-03 MED ORDER — VALACYCLOVIR HCL 500 MG PO TABS: 500.0000 mg | ORAL_TABLET | Freq: Two times a day (BID) | ORAL | 6 refills | Status: DC

## 2018-01-03 MED ORDER — CLINDAMYCIN HCL 300 MG PO CAPS: 300.0000 mg | ORAL_CAPSULE | Freq: Three times a day (TID) | ORAL | 2 refills | Status: DC

## 2018-01-03 MED ORDER — METRONIDAZOLE 500 MG PO TABS: 500.0000 mg | ORAL_TABLET | Freq: Two times a day (BID) | ORAL | 5 refills | Status: DC

## 2018-01-03 NOTE — Progress Notes (Signed)
Pt presents for Annual Exam.  CC: None   LMP:12/25/2017 Last pap:11/30/2016 Contraception: none STD Screening:Full panel  Mammogram: last week per pt 12/27/2017 Diagnostic scheduled 06/29/18 of left breast.

## 2018-01-03 NOTE — Addendum Note (Signed)
Addended by: Courtney Heys on: 01/03/2018 01:03 PM   Modules accepted: Orders

## 2018-01-03 NOTE — Progress Notes (Signed)
Subjective:        Catherine Osborne is a 42 y.o. female here for a routine exam.  Current complaints: none.    Personal health questionnaire:  Is patient [name], have a family history of breast and/or ovarian cancer: yes, mother Is there a family history of uterine cancer diagnosed at age < 43, gastrointestinal cancer, urinary tract cancer, family member who is a Field seismologist syndrome-associated carrier: no Is the patient overweight and hypertensive, family history of diabetes, personal history of gestational diabetes, preeclampsia or PCOS: no Is patient over 58, have PCOS,  family history of premature CHD under age 21, diabetes, smoke, have hypertension or peripheral artery disease:  no At any time, has a partner hit, kicked or otherwise hurt or frightened you?: no Over the past 2 weeks, have you felt down, depressed or hopeless?: no Over the past 2 weeks, have you felt little interest or pleasure in doing things?:no   Gynecologic History Patient's last menstrual period was 12/25/2017 (approximate). Contraception: none Last Pap: 2018. Results were: normal Last mammogram: 2019. Results were: normal  Obstetric History OB History  Gravida Para Term Preterm AB Living  14 2 1 1 12 1   SAB TAB Ectopic Multiple Live Births  10 2   0 1    # Outcome Date GA Lbr Len/2nd Weight Sex Delivery Anes PTL Lv  14 Term 06/12/15 [redacted]w[redacted]d 19:15 / 00:19 7 lb 0.7 oz (3.196 kg) M Vag-Spont EPI  LIV  13 Preterm  [redacted]w[redacted]d    Vag-Spont     12 SAB           11 SAB           10 SAB           9 SAB           8 SAB           7 SAB           6 SAB           5 SAB           4 SAB           3 TAB           2 TAB           1 SAB             Past Medical History:  Diagnosis Date  . Allergy   . Asthma   . Headache   . 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 Pneumonia   . Prior miscarriage with pregnancy in first trimester, antepartum   . Vaginal Pap smear, abnormal     Past Surgical History:   Procedure Laterality Date  . CERVICAL CERCLAGE N/A 02/26/2015   Procedure: CERCLAGE CERVICAL;  Surgeon: Frederico Hamman, MD;  Location: [facility];  Service: Gynecology;  Laterality: N/A;  . DILATION AND CURETTAGE OF UTERUS    . DILATION AND EVACUATION N/A 05/22/2012   Procedure: DILATATION AND EVACUATION;  Surgeon: Frederico Hamman, MD;  Location: [facility];  Service: Gynecology;  Laterality: N/A;  . FOOT SURGERY       Current Outpatient Medications:  .  albuterol (PROVENTIL HFA;VENTOLIN HFA) 108 (90 Base) MCG/ACT inhaler, Inhale 1-2 puffs into the lungs every 6 (six) hours as needed for wheezing or shortness of breath., Disp: 1 Inhaler, Rfl: 6 .  albuterol (PROVENTIL) (2.5 MG/3ML) 0.083% nebulizer solution, Take 3 mLs (2.5 mg total) by  nebulization every 6 (six) hours as needed for wheezing or shortness of breath., Disp: 75 mL, Rfl: 12 .  ibuprofen (ADVIL,MOTRIN) 800 MG tablet, Take 1 tablet (800 mg total) by mouth every 8 (eight) hours as needed., Disp: 30 tablet, Rfl: 5 .  amoxicillin-clavulanate (AUGMENTIN) 875-125 MG tablet, Take 1 tablet by mouth 2 (two) times daily. 1 tablet po BID x10 days (Patient not taking: Reported on 12/21/2017), Disp: 20 tablet, Rfl: 2 .  azithromycin (ZITHROMAX) 250 MG tablet, Take as directed (Patient not taking: Reported on 11/13/2017), Disp: 6 tablet, Rfl: 0 .  beclomethasone (QVAR REDIHALER) 80 MCG/ACT inhaler, Inhale 2 puffs into the lungs 2 (two) times daily. (Patient not taking: Reported on 11/13/2017), Disp: 1 Inhaler, Rfl: 2 .  clindamycin (CLEOCIN) 300 MG capsule, Take 1 capsule (300 mg total) by mouth 3 (three) times daily., Disp: 21 capsule, Rfl: 2 .  fluconazole (DIFLUCAN) 150 MG tablet, Take 1 tablet (150 mg total) by mouth once for 1 dose., Disp: 1 tablet, Rfl: 2 .  HYDROcodone-acetaminophen (NORCO) 10-325 MG tablet, Take 1 tablet by mouth every 4 (four) hours as needed. (Patient not taking: Reported on 11/13/2017), Disp: 10 tablet, Rfl: 0 .  meloxicam  (MOBIC) 15 MG tablet, Take 1 tablet (15 mg total) by mouth daily. (Patient not taking: Reported on 11/13/2017), Disp: 30 tablet, Rfl: 2 .  metroNIDAZOLE (FLAGYL) 500 MG tablet, Take 1 tablet (500 mg total) by mouth 2 (two) times daily. (Patient not taking: Reported on 11/13/2017), Disp: 14 tablet, Rfl: 0 .  metroNIDAZOLE (FLAGYL) 500 MG tablet, Take 1 tablet (500 mg total) by mouth 2 (two) times daily., Disp: 14 tablet, Rfl: 5 .  metroNIDAZOLE (METROGEL VAGINAL) 0.75 % vaginal gel, Place 1 Applicatorful vaginally 2 (two) times daily. (Patient not taking: Reported on 12/21/2017), Disp: 70 g, Rfl: 5 .  ondansetron (ZOFRAN) 4 MG tablet, Take 1 tablet (4 mg total) by mouth every 8 (eight) hours as needed for nausea or vomiting. (Patient not taking: Reported on 11/13/2017), Disp: 20 tablet, Rfl: 0 .  predniSONE (DELTASONE) 20 MG tablet, Take as directed (Patient not taking: Reported on 11/13/2017), Disp: 15 tablet, Rfl: 0 .  sertraline (ZOLOFT) 50 MG tablet, Take 1 tablet (50 mg total) by mouth daily. (Patient not taking: Reported on 12/21/2017), Disp: 30 tablet, Rfl: 11 .  valACYclovir (VALTREX) 500 MG tablet, Take 1 tablet (500 mg total) by mouth 2 (two) times daily., Disp: 30 tablet, Rfl: 6 No Known Allergies  Social History   Tobacco Use  . Smoking status: Current Some Day Smoker    Packs/day: 1.00    Years: 20.00    Pack years: 20.00    Types: Cigarettes    Last attempt to quit: 01/05/2014    Years since quitting: 3.9  . Smokeless tobacco: Never Used  . Tobacco comment: marijuana/30 mins ago  Substance Use Topics  . Alcohol use: Yes    Alcohol/week: 1.0 standard drinks    Types: 1 Glasses of wine per week    Comment: rare    Family History  Adopted: Yes  Problem Relation Age of Onset  . Bone cancer Mother   . Breast cancer Maternal Grandmother   . Diabetes Unknown   . Hypertension Unknown   . Stroke Unknown   . Heart attack Unknown       Review of Systems  Constitutional:  negative for fatigue and weight loss Respiratory: negative for cough and wheezing Cardiovascular: negative for chest pain, fatigue and palpitations Gastrointestinal:  negative for abdominal pain and change in bowel habits Musculoskeletal:negative for myalgias Neurological: negative for gait problems and tremors Behavioral/Psych: negative for abusive relationship, depression Endocrine: negative for temperature intolerance    Genitourinary:negative for abnormal menstrual periods, genital lesions, hot flashes, sexual problems and vaginal discharge Integument/breast: negative for breast lump, breast tenderness, nipple discharge and skin lesion(s)    Objective:       BP 100/69   Pulse 80   Wt 133 lb (60.3 kg)   LMP 12/25/2017 (Approximate)   BMI 24.33 kg/m  General:   alert  Skin:   no rash or abnormalities  Lungs:   clear to auscultation bilaterally  Heart:   regular rate and rhythm, S1, S2 normal, no murmur, click, rub or gallop  Breasts:   normal without suspicious masses, skin or nipple changes or axillary nodes  Abdomen:  normal findings: no organomegaly, soft, non-tender and no hernia  Pelvis:  External genitalia: normal general appearance Urinary system: urethral meatus normal and bladder without fullness, nontender Vaginal: normal without tenderness, induration or masses Cervix: normal appearance Adnexa: normal bimanual exam Uterus: anteverted and non-tender, normal size   Lab Review Urine pregnancy test Labs reviewed yes Radiologic studies reviewed yes  50% of 20 min visit spent on counseling and coordination of care.   Assessment:   1. Encounter for routine gynecological examination with Papanicolaou smear of cervix Rx: - Cytology - PAP( [facility] [address] [place])  2. Vaginal discharge Rx: - Cervicovaginal ancillary only  3. BV (bacterial vaginosis) Rx: - metroNIDAZOLE (FLAGYL) 500 MG tablet; Take 1 tablet (500 mg total) by mouth 2 (two) times daily.  Dispense: 14 tablet;  Refill: 5  4. Herpes simplex vulvovaginitis Rx: - valACYclovir (VALTREX) 500 MG tablet; Take 1 tablet (500 mg total) by mouth 2 (two) times daily.  Dispense: 30 tablet; Refill: 6  5. Candida vaginitis Rx: - fluconazole (DIFLUCAN) 150 MG tablet; Take 1 tablet (150 mg total) by mouth once for 1 dose.  Dispense: 1 tablet; Refill: 2  6. Boil of vulva Rx: - clindamycin (CLEOCIN) 300 MG capsule; Take 1 capsule (300 mg total) by mouth 3 (three) times daily.  Dispense: 21 capsule; Refill: 2  7. Screen for STD (sexually transmitted disease) Rx: - HIV Antibody (routine testing w rflx) - Hepatitis B surface antigen - RPR - Hepatitis C antibody  8. Encounter for other general counseling and advice on contraception - declines  9. Tobacco dependence due to cigarettes - program that includes medication and behavioral modification recommended      Plan:    Education reviewed: calcium supplements, depression evaluation, low fat, low cholesterol diet, safe sex/STD prevention, self breast exams, smoking cessation and weight bearing exercise. Contraception: none. Follow up in: 1 year.   Meds ordered this encounter  Medications  . metroNIDAZOLE (FLAGYL) 500 MG tablet    Sig: Take 1 tablet (500 mg total) by mouth 2 (two) times daily.    Dispense:  14 tablet    Refill:  5  . valACYclovir (VALTREX) 500 MG tablet    Sig: Take 1 tablet (500 mg total) by mouth 2 (two) times daily.    Dispense:  30 tablet    Refill:  6  . fluconazole (DIFLUCAN) 150 MG tablet    Sig: Take 1 tablet (150 mg total) by mouth once for 1 dose.    Dispense:  1 tablet    Refill:  2  . clindamycin (CLEOCIN) 300 MG capsule    Sig: Take 1 capsule (300  mg total) by mouth 3 (three) times daily.    Dispense:  21 capsule    Refill:  2   Orders Placed This Encounter  Procedures  . HIV Antibody (routine testing w rflx)  . Hepatitis B surface antigen  . RPR  . Hepatitis C antibody    Shelly Bombard  MD 01-03-2018

## 2018-01-04 LAB — HEPATITIS B SURFACE ANTIGEN: Hepatitis B Surface Ag: NEGATIVE

## 2018-01-04 LAB — CERVICOVAGINAL ANCILLARY ONLY
BACTERIAL VAGINITIS: POSITIVE — AB
Candida vaginitis: NEGATIVE
Chlamydia: NEGATIVE
Neisseria Gonorrhea: NEGATIVE
Trichomonas: NEGATIVE

## 2018-01-04 LAB — RPR: RPR Ser Ql: NONREACTIVE

## 2018-01-04 LAB — HIV ANTIBODY (ROUTINE TESTING W REFLEX): HIV Screen 4th Generation wRfx: NONREACTIVE

## 2018-01-04 LAB — HEPATITIS C ANTIBODY: Hep C Virus Ab: <0.1 s/co ratio (ref 0.0–0.9)

## 2018-01-05 ENCOUNTER — Other Ambulatory Visit: Payer: Self-pay | Admitting: Obstetrics

## 2018-01-05 LAB — CYTOLOGY - PAP
Diagnosis: NEGATIVE
HPV: NOT DETECTED

## 2018-02-07 ENCOUNTER — Encounter: Payer: Self-pay | Admitting: Primary Care

## 2018-02-07 ENCOUNTER — Ambulatory Visit (INDEPENDENT_AMBULATORY_CARE_PROVIDER_SITE_OTHER): Payer: Medicaid Other | Admitting: Primary Care

## 2018-02-07 VITALS — BP 96/68 | HR 73 | Temp 98.2°F | Ht 62.0 in | Wt 130.4 lb

## 2018-02-07 DIAGNOSIS — J4520 Mild intermittent asthma, uncomplicated: Secondary | ICD-10-CM

## 2018-02-07 LAB — NITRIC OXIDE: FeNO level (ppb): 32

## 2018-02-07 MED ORDER — VARENICLINE TARTRATE 0.5 MG X 11 & 1 MG X 42 PO MISC: ORAL | 0 refills | Status: DC

## 2018-02-07 MED ORDER — FLUTICASONE FUROATE-VILANTEROL 100-25 MCG/INH IN AEPB: 1.0000 | INHALATION_SPRAY | Freq: Every day | RESPIRATORY_TRACT | 1 refills | Status: DC

## 2018-02-07 NOTE — Progress Notes (Signed)
@Patient  ID: Catherine Osborne, female    DOB: [birth year], 43 y.o.   MRN: [id number]  Chief Complaint  Patient presents with  . Follow-up    SOB at night and feeling hot, cough with green mucus,    Referring provider: Care, Lenord Carbo*  HPI: 43 year old female, recent former smoker (quit 01/31/18). PMH significant for asthma, mild airway obstruction. Patient of Dr. Lenna Gilford, last seen in Jan 16th 2019.   02/08/2018 Patient presents for annual follow up. Accompanied by her younger son. States that her breathing overall is ok, hard at night. Reports that the heat rises and messes with her asthma. Not currently on any inhalers. No shortness of breath during the day, only at night when she has a cigarette. Current sinus and chest congestion but feels its just a cold. States that she was out in the cold on her birthday. Quit smoking on January first of this year. Afebrile.   02/07/2018 Spirometry- FVC 3.8 (132%), FEV1 2.0 (85%), ratio 53 02/07/2018 FENO- 32   No Known Allergies  Immunization History  Administered Date(s) Administered  . Influenza,inj,Quad PF,6+ Mos 10/31/2016, 01/03/2018  . Tdap 06/13/2015    Past Medical History:  Diagnosis Date  . Allergy   . Asthma   . Headache   . 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 Pneumonia   . Prior miscarriage with pregnancy in first trimester, antepartum   . Vaginal Pap smear, abnormal     Tobacco History: Social History   Tobacco Use  Smoking Status Former Smoker  . Packs/day: 1.00  . Years: 20.00  . Pack years: 20.00  . Types: Cigarettes  . Last attempt to quit: 01/31/2018  . Years since quitting: 0.0  Smokeless Tobacco Never Used  Tobacco Comment   marijuana/30 mins ago   Counseling given: Not Answered Comment: marijuana/30 mins ago   Outpatient Medications Prior to Visit  Medication Sig Dispense Refill  . metroNIDAZOLE (METROGEL VAGINAL) 0.75 % vaginal gel Place 1 Applicatorful vaginally 2 (two) times daily. 70 g 5  .  amoxicillin-clavulanate (AUGMENTIN) 875-125 MG tablet Take 1 tablet by mouth 2 (two) times daily. 1 tablet po BID x10 days 20 tablet 2  . azithromycin (ZITHROMAX) 250 MG tablet Take as directed 6 tablet 0  . clindamycin (CLEOCIN) 300 MG capsule Take 1 capsule (300 mg total) by mouth 3 (three) times daily. 21 capsule 2  . HYDROcodone-acetaminophen (NORCO) 10-325 MG tablet Take 1 tablet by mouth every 4 (four) hours as needed. 10 tablet 0  . ibuprofen (ADVIL,MOTRIN) 800 MG tablet Take 1 tablet (800 mg total) by mouth every 8 (eight) hours as needed. 30 tablet 5  . meloxicam (MOBIC) 15 MG tablet Take 1 tablet (15 mg total) by mouth daily. 30 tablet 2  . metroNIDAZOLE (FLAGYL) 500 MG tablet Take 1 tablet (500 mg total) by mouth 2 (two) times daily. 14 tablet 0  . metroNIDAZOLE (FLAGYL) 500 MG tablet Take 1 tablet (500 mg total) by mouth 2 (two) times daily. 14 tablet 5  . ondansetron (ZOFRAN) 4 MG tablet Take 1 tablet (4 mg total) by mouth every 8 (eight) hours as needed for nausea or vomiting. 20 tablet 0  . predniSONE (DELTASONE) 20 MG tablet Take as directed 15 tablet 0  . sertraline (ZOLOFT) 50 MG tablet Take 1 tablet (50 mg total) by mouth daily. 30 tablet 11  . valACYclovir (VALTREX) 500 MG tablet Take 1 tablet (500 mg total) by mouth 2 (two) times daily.  30 tablet 6  . albuterol (PROVENTIL HFA;VENTOLIN HFA) 108 (90 Base) MCG/ACT inhaler Inhale 1-2 puffs into the lungs every 6 (six) hours as needed for wheezing or shortness of breath. (Patient not taking: Reported on 02/07/2018) 1 Inhaler 6  . albuterol (PROVENTIL) (2.5 MG/3ML) 0.083% nebulizer solution Take 3 mLs (2.5 mg total) by nebulization every 6 (six) hours as needed for wheezing or shortness of breath. (Patient not taking: Reported on 02/07/2018) 75 mL 12  . beclomethasone (QVAR REDIHALER) 80 MCG/ACT inhaler Inhale 2 puffs into the lungs 2 (two) times daily. (Patient not taking: Reported on 02/07/2018) 1 Inhaler 2   No facility-administered  medications prior to visit.     Review of Systems  Review of Systems  Constitutional: Negative.   HENT: Positive for congestion and postnasal drip.   Respiratory: Positive for cough and shortness of breath. Negative for wheezing.     Physical Exam  BP 96/68 (BP Location: Right Arm, Cuff Size: Normal)   Pulse 73   Temp 98.2 F (36.8 C)   Ht 5\' 2"  (1.575 m)   Wt 130 lb 6.4 oz (59.1 kg)   SpO2 100%   BMI 23.85 kg/m  Physical Exam Constitutional:      Appearance: She is well-developed.  HENT:     Head: Normocephalic and atraumatic.  Eyes:     Pupils: Pupils are equal, round, and reactive to light.  Neck:     Musculoskeletal: Normal range of motion and neck supple.  Cardiovascular:     Rate and Rhythm: Normal rate and regular rhythm.     Heart sounds: Normal heart sounds. No murmur.  Pulmonary:     Effort: Pulmonary effort is normal. No respiratory distress.     Breath sounds: Normal breath sounds. No wheezing or rhonchi.  Abdominal:     General: Bowel sounds are normal.     Palpations: Abdomen is soft.     Tenderness: There is no abdominal tenderness.  Skin:    General: Skin is warm and dry.     Findings: No erythema or rash.  Neurological:     Mental Status: She is alert and oriented to person, place, and time.  Psychiatric:        Behavior: Behavior normal.        Judgment: Judgment normal.      Lab Results:  CBC    Component Value Date/Time   WBC 7.8 04/02/2017 1533   RBC 5.01 04/02/2017 1533   HGB 14.4 04/02/2017 1533   HCT 43.4 04/02/2017 1533   PLT 286 04/02/2017 1533   MCV 86.6 04/02/2017 1533   MCH 28.7 04/02/2017 1533   MCHC 33.2 04/02/2017 1533   RDW 15.1 04/02/2017 1533   LYMPHSABS 1.5 12/05/2016 0600   MONOABS 1.4 (H) 12/05/2016 0600   EOSABS 0.0 12/05/2016 0600   BASOSABS 0.0 12/05/2016 0600    BMET    Component Value Date/Time   NA 136 04/02/2017 1533   K 3.4 (L) 04/02/2017 1533   CL 104 04/02/2017 1533   CO2 21 (L) 04/02/2017  1533   GLUCOSE 93 04/02/2017 1533   BUN 9 04/02/2017 1533   CREATININE 0.70 04/02/2017 1533   CALCIUM 9.0 04/02/2017 1533   GFRNONAA >60 04/02/2017 1533   GFRAA >60 04/02/2017 1533    BNP No results found for: BNP  ProBNP No results found for: PROBNP  Imaging: No results found.   Assessment & Plan:   Asthma - FENO 32 - Spirometry with mild airway  obstruction - Starting ICS/LABA daily maintenance inhaler (BREO sample given) - Quit smoking 01/31/18 - FU in 4 weeks    Martyn Ehrich, NP 02/08/2018

## 2018-02-07 NOTE — Patient Instructions (Signed)
Start Breo 1 puff daily  Follow up in 4 weeks   Congratulations on quitting smoking!    Coping with Quitting Smoking  Quitting smoking is a physical and mental challenge. You will face cravings, withdrawal symptoms, and temptation. Before quitting, work with your health care provider to make a plan that can help you cope. Preparation can help you quit and keep you from giving in. How can I cope with cravings? Cravings usually last for 5-10 minutes. If you get through it, the craving will pass. Consider taking the following actions to help you cope with cravings:  Keep your mouth busy: ? Chew sugar-free gum. ? Suck on hard candies or a straw. ? Brush your teeth.  Keep your hands and body busy: ? Immediately change to a different activity when you feel a craving. ? Squeeze or play with a ball. ? Do an activity or a hobby, like making bead jewelry, practicing needlepoint, or working with wood. ? Mix up your normal routine. ? Take a short exercise break. Go for a quick walk or run up and down stairs. ? Spend time in public places where smoking is not allowed.  Focus on doing something kind or helpful for someone else.  Call a friend or family member to talk during a craving.  Join a support group.  Call a quit line, such as 1-800-QUIT-NOW.  Talk with your health care provider about medicines that might help you cope with cravings and make quitting easier for you. How can I deal with withdrawal symptoms? Your body may experience negative effects as it tries to get used to not having nicotine in the system. These effects are called withdrawal symptoms. They may include:  Feeling hungrier than normal.  Trouble concentrating.  Irritability.  Trouble sleeping.  Feeling depressed.  Restlessness and agitation.  Craving a cigarette. To manage withdrawal symptoms:  Avoid places, people, and activities that trigger your cravings.  Remember why you want to quit.  Get plenty  of sleep.  Avoid coffee and other caffeinated drinks. These may worsen some of your symptoms. How can I handle social situations? Social situations can be difficult when you are quitting smoking, especially in the first few weeks. To manage this, you can:  Avoid parties, bars, and other social situations where people might be smoking.  Avoid alcohol.  Leave right away if you have the urge to smoke.  Explain to your family and friends that you are quitting smoking. Ask for understanding and support.  Plan activities with friends or family where smoking is not an option. What are some ways I can cope with stress? Wanting to smoke may cause stress, and stress can make you want to smoke. Find ways to manage your stress. Relaxation techniques can help. For example:  Breathe slowly and deeply, in through your nose and out through your mouth.  Listen to soothing, relaxing music.  Talk with a family member or friend about your stress.  Light a candle.  Soak in a bath or take a shower.  Think about a peaceful place. What are some ways I can prevent weight gain? Be aware that many people gain weight after they quit smoking. However, not everyone does. To keep from gaining weight, have a plan in place before you quit and stick to the plan after you quit. Your plan should include:  Having healthy snacks. When you have a craving, it may help to: ? Eat plain popcorn, crunchy carrots, celery, or other cut vegetables. ?  Chew sugar-free gum.  Changing how you eat: ? Eat small portion sizes at meals. ? Eat 4-6 small meals throughout the day instead of 1-2 large meals a day. ? Be mindful when you eat. Do not watch television or do other things that might distract you as you eat.  Exercising regularly: ? Make time to exercise each day. If you do not have time for a long workout, do short bouts of exercise for 5-10 minutes several times a day. ? Do some form of strengthening exercise, like  weight lifting, and some form of aerobic exercise, like running or swimming.  Drinking plenty of water or other low-calorie or no-calorie drinks. Drink 6-8 glasses of water daily, or as much as instructed by your health care provider. Summary  Quitting smoking is a physical and mental challenge. You will face cravings, withdrawal symptoms, and temptation to smoke again. Preparation can help you as you go through these challenges.  You can cope with cravings by keeping your mouth busy (such as by chewing gum), keeping your body and hands busy, and making calls to family, friends, or a helpline for people who want to quit smoking.  You can cope with withdrawal symptoms by avoiding places where people smoke, avoiding drinks with caffeine, and getting plenty of rest.  Ask your health care provider about the different ways to prevent weight gain, avoid stress, and handle social situations. This information is not intended to replace advice given to you by your health care provider. Make sure you discuss any questions you have with your health care provider. Document Released: 01/15/2016 Document Revised: 01/15/2016 Document Reviewed: 01/15/2016 Elsevier Interactive Patient Education  2019 [employer].

## 2018-02-08 ENCOUNTER — Encounter: Payer: Self-pay | Admitting: Primary Care

## 2018-02-08 NOTE — Assessment & Plan Note (Signed)
-   FENO 32 - Spirometry with mild airway obstruction - Starting ICS/LABA daily maintenance inhaler (BREO sample given) - Quit smoking 01/31/18 - FU in 4 weeks

## 2018-02-14 ENCOUNTER — Other Ambulatory Visit: Payer: Self-pay | Admitting: Podiatry

## 2018-02-14 ENCOUNTER — Ambulatory Visit: Payer: Medicaid Other | Admitting: Podiatry

## 2018-02-14 ENCOUNTER — Ambulatory Visit (INDEPENDENT_AMBULATORY_CARE_PROVIDER_SITE_OTHER): Payer: Medicaid Other

## 2018-02-14 ENCOUNTER — Other Ambulatory Visit (HOSPITAL_COMMUNITY)
Admission: RE | Admit: 2018-02-14 | Discharge: 2018-02-14 | Disposition: A | Discharge status: Home / Self Care | Payer: Medicaid Other | Source: Ambulatory Visit | Attending: Obstetrics | Admitting: Obstetrics

## 2018-02-14 ENCOUNTER — Encounter: Payer: Self-pay | Admitting: Podiatry

## 2018-02-14 VITALS — BP 99/66 | HR 72 | Wt 130.0 lb

## 2018-02-14 DIAGNOSIS — D492 Neoplasm of unspecified behavior of bone, soft tissue, and skin: Secondary | ICD-10-CM | POA: Diagnosis not present

## 2018-02-14 DIAGNOSIS — M779 Enthesopathy, unspecified: Principal | ICD-10-CM

## 2018-02-14 DIAGNOSIS — M7752 Other enthesopathy of left foot: Secondary | ICD-10-CM

## 2018-02-14 DIAGNOSIS — M778 Other enthesopathies, not elsewhere classified: Secondary | ICD-10-CM

## 2018-02-14 DIAGNOSIS — N898 Other specified noninflammatory disorders of vagina: Secondary | ICD-10-CM

## 2018-02-14 DIAGNOSIS — B070 Plantar wart: Secondary | ICD-10-CM

## 2018-02-14 DIAGNOSIS — R300 Dysuria: Secondary | ICD-10-CM

## 2018-02-14 DIAGNOSIS — M79672 Pain in left foot: Secondary | ICD-10-CM

## 2018-02-14 LAB — POCT URINALYSIS DIPSTICK
Glucose, UA: NEGATIVE
Leukocytes, UA: NEGATIVE
NITRITE UA: NEGATIVE
PH UA: 6 (ref 5.0–8.0)
PROTEIN UA: POSITIVE — AB
RBC UA: NEGATIVE
Spec Grav, UA: 1.02 (ref 1.010–1.025)
UROBILINOGEN UA: NEGATIVE U/dL — AB

## 2018-02-14 NOTE — Progress Notes (Signed)
I have reviewed the chart and agree with nursing staff's documentation of this patient's encounter.  Baltazar Najjar, MD 02/14/2018 1:50 PM

## 2018-02-14 NOTE — Progress Notes (Signed)
Presents for Malodorous, brown, Vaginal Discharge and burning during urination  X 2 weeks. Denies pain, itching, NV, chills or fever.  SUBJECTIVE:  43 y.o. female complains of malodorous, brown vaginal discharge for 2 week(s). Denies abnormal vaginal bleeding or significant pelvic pain or fever. No UTI symptoms. Denies history of known exposure to STD.  No LMP recorded (lmp unknown).  OBJECTIVE:  She appears well, afebrile. Urine dipstick: positive for protein, ketones. bil  ASSESSMENT:  Vaginal Discharge  Vaginal Odor   PLAN:  GC, chlamydia, trichomonas, BVAG, CVAG probe sent to lab. Treatment: To be determined once lab results are received ROV prn if symptoms persist or worsen. Increase water intake.

## 2018-02-15 LAB — CERVICOVAGINAL ANCILLARY ONLY
Bacterial vaginitis: POSITIVE — AB
CANDIDA VAGINITIS: NEGATIVE
Chlamydia: NEGATIVE
Neisseria Gonorrhea: NEGATIVE
Trichomonas: NEGATIVE

## 2018-02-15 NOTE — Progress Notes (Signed)
Subjective:   [name], female   DOB: 43 y.o.   MRN: [id number]   HPI Patient states I am getting some thickness on the outside of his foot again and I know I have family history of thick skin   ROS      Objective:  Physical Exam  Neurovascular status intact with previous surgery that healed very well with keratotic tissue sub-fifth metatarsal left that is thick and is painful     Assessment:  Possibility for verruca plantaris plantar left with thick callus-like tissue formation     Plan:  Sterile deep debridement accomplished today with no breakdown of tissue and I did go ahead and applied sterile dressing around the area and applied salicylic acid to try to soften and try to reduce any verrucous tissue that may be present.  This will most likely need to be repeated in the future  X-rays indicate satisfactory resection of fifth metatarsal head left with no pressure on the area of lesion formation

## 2018-02-20 ENCOUNTER — Telehealth: Payer: Self-pay

## 2018-02-20 MED ORDER — METRONIDAZOLE 500 MG PO TABS: 500.0000 mg | ORAL_TABLET | Freq: Two times a day (BID) | ORAL | 0 refills | Status: DC

## 2018-02-20 MED ORDER — FLUCONAZOLE 150 MG PO TABS: 150.0000 mg | ORAL_TABLET | Freq: Once | ORAL | 0 refills | Status: AC

## 2018-02-20 NOTE — Telephone Encounter (Signed)
Patient called in for results and stated that she cannot receive incoming calls on her phone, advised of results and rx sent per protocol.

## 2018-02-25 ENCOUNTER — Other Ambulatory Visit: Payer: Self-pay | Admitting: Obstetrics

## 2018-03-07 ENCOUNTER — Ambulatory Visit: Payer: Medicaid Other | Admitting: Primary Care

## 2018-03-31 ENCOUNTER — Emergency Department (HOSPITAL_COMMUNITY)
Admission: EM | Admit: 2018-03-31 | Discharge: 2018-03-31 | Disposition: A | Discharge status: Home / Self Care | Payer: Medicaid Other | Attending: Emergency Medicine | Admitting: Emergency Medicine

## 2018-03-31 ENCOUNTER — Encounter (HOSPITAL_COMMUNITY): Payer: Self-pay

## 2018-03-31 ENCOUNTER — Other Ambulatory Visit: Payer: Self-pay

## 2018-03-31 DIAGNOSIS — J45909 Unspecified asthma, uncomplicated: Secondary | ICD-10-CM | POA: Diagnosis not present

## 2018-03-31 DIAGNOSIS — Z87891 Personal history of nicotine dependence: Secondary | ICD-10-CM | POA: Insufficient documentation

## 2018-03-31 DIAGNOSIS — Z79899 Other long term (current) drug therapy: Secondary | ICD-10-CM | POA: Insufficient documentation

## 2018-03-31 DIAGNOSIS — R197 Diarrhea, unspecified: Secondary | ICD-10-CM | POA: Insufficient documentation

## 2018-03-31 LAB — CBC WITH DIFFERENTIAL/PLATELET
Abs Immature Granulocytes: 0.01 10*3/uL (ref 0.00–0.07)
Basophils Absolute: 0 10*3/uL (ref 0.0–0.1)
Basophils Relative: 1 %
Eosinophils Absolute: 0 10*3/uL (ref 0.0–0.5)
Eosinophils Relative: 0 %
HCT: 44.3 % (ref 36.0–46.0)
Hemoglobin: 14.3 g/dL (ref 12.0–15.0)
Immature Granulocytes: 0 %
Lymphocytes Relative: 12 %
Lymphs Abs: 0.5 10*3/uL — ABNORMAL LOW (ref 0.7–4.0)
MCH: 28 pg (ref 26.0–34.0)
MCHC: 32.3 g/dL (ref 30.0–36.0)
MCV: 86.7 fL (ref 80.0–100.0)
Monocytes Absolute: 0.9 10*3/uL (ref 0.1–1.0)
Monocytes Relative: 21 %
Neutro Abs: 2.7 10*3/uL (ref 1.7–7.7)
Neutrophils Relative %: 66 %
Platelets: 197 10*3/uL (ref 150–400)
RBC: 5.11 MIL/uL (ref 3.87–5.11)
RDW: 14 % (ref 11.5–15.5)
WBC: 4.1 10*3/uL (ref 4.0–10.5)
nRBC: 0 % (ref 0.0–0.2)

## 2018-03-31 LAB — URINALYSIS, ROUTINE W REFLEX MICROSCOPIC
Bilirubin Urine: NEGATIVE
Glucose, UA: NEGATIVE mg/dL
Hgb urine dipstick: NEGATIVE
Ketones, ur: 80 mg/dL — AB
Leukocytes,Ua: NEGATIVE
Nitrite: NEGATIVE
Protein, ur: 100 mg/dL — AB
Specific Gravity, Urine: 1.028 (ref 1.005–1.030)
pH: 6 (ref 5.0–8.0)

## 2018-03-31 LAB — BASIC METABOLIC PANEL
Anion gap: 12 (ref 5–15)
BUN: 8 mg/dL (ref 6–20)
CO2: 20 mmol/L — ABNORMAL LOW (ref 22–32)
Calcium: 8.7 mg/dL — ABNORMAL LOW (ref 8.9–10.3)
Chloride: 102 mmol/L (ref 98–111)
Creatinine, Ser: 0.72 mg/dL (ref 0.44–1.00)
GFR calc Af Amer: >60 mL/min (ref >60)
GFR calc non Af Amer: >60 mL/min (ref >60)
Glucose, Bld: 88 mg/dL (ref 70–99)
Potassium: 3.2 mmol/L — ABNORMAL LOW (ref 3.5–5.1)
Sodium: 134 mmol/L — ABNORMAL LOW (ref 135–145)

## 2018-03-31 LAB — I-STAT BETA HCG BLOOD, ED (MC, WL, AP ONLY): I-stat hCG, quantitative: <5 m[IU]/mL (ref <5)

## 2018-03-31 MED ORDER — ACETAMINOPHEN 325 MG PO TABS
650.0000 mg | ORAL_TABLET | Freq: Once | ORAL | Status: AC
  Administered 2018-03-31: 650 mg via ORAL
  Filled 2018-03-31: qty 2

## 2018-03-31 MED ORDER — SODIUM CHLORIDE 0.9 % IV BOLUS
1000.0000 mL | Freq: Once | INTRAVENOUS | Status: AC
  Administered 2018-03-31: 1000 mL via INTRAVENOUS

## 2018-03-31 NOTE — ED Notes (Signed)
Patient verbalizes understanding of discharge instructions. Opportunity for questioning and answers were provided. Pt discharged from ED. 

## 2018-03-31 NOTE — ED Provider Notes (Signed)
[facility] EMERGENCY DEPARTMENT Provider Note   CSN: [id number] Arrival date & time: 03/31/18  1132    History   Chief Complaint Chief Complaint  Patient presents with  . Diarrhea    HPI MEIYA WISLER is a 43 y.o. female.     The history is provided by the patient.  Diarrhea  Quality:  Watery Severity:  Mild Onset quality:  Gradual Duration:  2 days Timing:  Constant Progression:  Unchanged Relieved by:  Nothing Worsened by:  Nothing Associated symptoms: chills, headaches, myalgias and URI   Associated symptoms: no abdominal pain, no arthralgias, no recent cough, no diaphoresis, no fever and no vomiting   Risk factors: sick contacts     Past Medical History:  Diagnosis Date  . Allergy   . Asthma   . Headache   . 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 Pneumonia   . Prior miscarriage with pregnancy in first trimester, antepartum   . Vaginal Pap smear, abnormal     Patient Active Problem List   Diagnosis Date Noted  . Seasonal allergies 01/18/2016  . Cigarette smoker 01/18/2016  . Compliance poor 01/18/2016  . Asthma 12/07/2015  . Goiter diffuse 12/07/2015  . NSVD (normal spontaneous vaginal delivery) 06/13/2015  . Pregnancy induced hypertension 06/12/2015  . Preeclampsia 06/10/2015  . Short cervix affecting pregnancy 02/25/2015  . Advanced maternal age in multigravida 02/12/2015  . Left thyroid nodule 10/25/2011    Past Surgical History:  Procedure Laterality Date  . CERVICAL CERCLAGE N/A 02/26/2015   Procedure: CERCLAGE CERVICAL;  Surgeon: Frederico Hamman, MD;  Location: [facility];  Service: Gynecology;  Laterality: N/A;  . DILATION AND CURETTAGE OF UTERUS    . DILATION AND EVACUATION N/A 05/22/2012   Procedure: DILATATION AND EVACUATION;  Surgeon: Frederico Hamman, MD;  Location: [facility];  Service: Gynecology;  Laterality: N/A;  . FOOT SURGERY       OB History    Gravida  14   Para  2   Term  1   Preterm  1   AB  12   Living  1     SAB  10   TAB  2   Ectopic      Multiple  0   Live Births  1            Home Medications    Prior to Admission medications   Medication Sig Start Date End Date Taking? Authorizing Provider  albuterol (PROVENTIL HFA;VENTOLIN HFA) 108 (90 Base) MCG/ACT inhaler Inhale 1-2 puffs into the lungs every 6 (six) hours as needed for wheezing or shortness of breath. 02/15/17   Noralee Space, MD  albuterol (PROVENTIL) (2.5 MG/3ML) 0.083% nebulizer solution Take 3 mLs (2.5 mg total) by nebulization every 6 (six) hours as needed for wheezing or shortness of breath. 10/08/15   Noe Gens, PA-C  beclomethasone (QVAR REDIHALER) 80 MCG/ACT inhaler Inhale 2 puffs into the lungs 2 (two) times daily. 02/15/17   Noralee Space, MD  fluticasone furoate-vilanterol (BREO ELLIPTA) 100-25 MCG/INH AEPB Inhale 1 puff into the lungs daily. 02/07/18   Martyn Ehrich, NP  metroNIDAZOLE (FLAGYL) 500 MG tablet Take 1 tablet (500 mg total) by mouth 2 (two) times daily. 02/20/18   Shelly Bombard, MD  metroNIDAZOLE (METROGEL VAGINAL) 0.75 % vaginal gel Place 1 Applicatorful vaginally 2 (two) times daily. 08/16/17   Shelly Bombard, MD  varenicline (CHANTIX PAK) 0.5 MG X 11 & 1  MG X 42 tablet Take one 0.5 mg tablet by mouth once daily for 3 days, then increase to one 0.5 mg tablet twice daily for 4 days, then increase to one 1 mg tablet twice daily. 02/07/18   Martyn Ehrich, NP    Family History Family History  Adopted: Yes  Problem Relation Age of Onset  . Bone cancer Mother   . Breast cancer Maternal Grandmother   . Diabetes Other   . Hypertension Other   . Stroke Other   . Heart attack Other     Social History Social History   Tobacco Use  . Smoking status: Former Smoker    Packs/day: 1.00    Years: 20.00    Pack years: 20.00    Types: Cigarettes    Last attempt to quit: 01/31/2018    Years since quitting: 0.1  . Smokeless tobacco: Never Used  . Tobacco comment: marijuana/30  mins ago  Substance Use Topics  . Alcohol use: Yes    Alcohol/week: 1.0 standard drinks    Types: 1 Glasses of wine per week    Comment: rare  . Drug use: Yes    Types: Marijuana    Comment: occasional use     Allergies   Patient has no known allergies.   Review of Systems Review of Systems  Constitutional: Positive for chills. Negative for diaphoresis and fever.  HENT: Negative for ear pain and sore throat.   Eyes: Negative for pain and visual disturbance.  Respiratory: Negative for cough and shortness of breath.   Cardiovascular: Negative for chest pain and palpitations.  Gastrointestinal: Positive for diarrhea. Negative for abdominal pain and vomiting.  Genitourinary: Negative for dysuria and hematuria.  Musculoskeletal: Positive for myalgias. Negative for arthralgias and back pain.  Skin: Negative for color change and rash.  Neurological: Positive for headaches. Negative for seizures and syncope.  All other systems reviewed and are negative.    Physical Exam Updated Vital Signs BP 119/80   Pulse 85   Temp 99.3 F (37.4 C) (Oral)   Resp 16   SpO2 96%   Physical Exam Vitals signs and nursing note reviewed.  Constitutional:      General: She is not in acute distress.    Appearance: She is well-developed.  HENT:     Head: Normocephalic and atraumatic.     Mouth/Throat:     Mouth: Mucous membranes are dry.  Eyes:     Extraocular Movements: Extraocular movements intact.     Conjunctiva/sclera: Conjunctivae normal.     Pupils: Pupils are equal, round, and reactive to light.  Neck:     Musculoskeletal: Normal range of motion and neck supple.  Cardiovascular:     Rate and Rhythm: Normal rate and regular rhythm.     Pulses: Normal pulses.     Heart sounds: Normal heart sounds. No murmur.  Pulmonary:     Effort: Pulmonary effort is normal. No respiratory distress.     Breath sounds: Normal breath sounds.  Abdominal:     General: There is no distension.      Palpations: Abdomen is soft. There is no mass.     Tenderness: There is no abdominal tenderness. There is no right CVA tenderness, left CVA tenderness, guarding or rebound.     Hernia: No hernia is present.  Musculoskeletal: Normal range of motion.     Right lower leg: No edema.     Left lower leg: No edema.  Skin:    General: Skin  is warm and dry.     Capillary Refill: Capillary refill takes less than 2 seconds.  Neurological:     General: No focal deficit present.     Mental Status: She is alert.  Psychiatric:        Mood and Affect: Mood normal.      ED Treatments / Results  Labs (all labs ordered are listed, but only abnormal results are displayed) Labs Reviewed  CBC WITH DIFFERENTIAL/PLATELET - Abnormal; Notable for the following components:      Result Value   Lymphs Abs 0.5 (*)    All other components within normal limits  BASIC METABOLIC PANEL - Abnormal; Notable for the following components:   Sodium 134 (*)    Potassium 3.2 (*)    CO2 20 (*)    Calcium 8.7 (*)    All other components within normal limits  URINALYSIS, ROUTINE W REFLEX MICROSCOPIC - Abnormal; Notable for the following components:   APPearance HAZY (*)    Ketones, ur 80 (*)    Protein, ur 100 (*)    Bacteria, UA RARE (*)    All other components within normal limits  I-STAT BETA HCG BLOOD, ED ([facility], [facility], [facility] ONLY)    EKG None  Radiology No results found.  Procedures Procedures (including critical care time)  Medications Ordered in ED Medications  sodium chloride 0.9 % bolus 1,000 mL (0 mLs Intravenous Stopped 03/31/18 1334)  acetaminophen (TYLENOL) tablet 650 mg (650 mg Oral Given 03/31/18 1228)     Initial Impression / Assessment and Plan / ED Course  I have reviewed the triage vital signs and the nursing notes.  Pertinent labs & imaging results that were available during my care of the patient were reviewed by me and considered in my medical decision making (see chart for details).         DYMIN DINGLEDINE is a 43 year old female with history of asthma, headaches who presents to the ED with diarrhea.  Patient with normal vitals.  No fever.  Patient with multiple sick contacts at home with similar symptoms.  Possible viral versus possible food related.  Has no abdominal pain, no vomiting.  No abdominal tenderness on exam.  Negative pregnancy test.  No significant anemia, electrolyte abnormality, kidney injury.  Patient with no signs urinary tract infection.  Given no abdominal tenderness no concern for pancreatitis or gallbladder liver etiology.  No recent antibiotic use.  Given multiple sick contacts likely viral process.  Felt better after IV fluids and Tylenol.  Recommend increase hydration at home.  Given education about diet going forward.  Given return precautions and discharged in good condition.  This chart was dictated using voice recognition software.  Despite best efforts to proofread,  errors can occur which can change the documentation meaning.    Final Clinical Impressions(s) / ED Diagnoses   Final diagnoses:  Diarrhea, unspecified type    ED Discharge Orders    None       Lennice Sites, DO 03/31/18 1400

## 2018-03-31 NOTE — ED Notes (Addendum)
Pt aware of need for urine sample, declines when offered to assisted to bathroom, cup at bedside, oriented on how to use call light to ask for assistance to bathroom

## 2018-03-31 NOTE — ED Triage Notes (Signed)
Pt here for flu like symptoms since last night (headache, nody aches, chest congestion, and diarrhea) Kids at home with similar symptoms. Denies cough, n/v. Pt a.o, nad noted.

## 2018-04-02 ENCOUNTER — Other Ambulatory Visit: Payer: Self-pay

## 2018-04-02 ENCOUNTER — Encounter (HOSPITAL_COMMUNITY): Payer: Self-pay

## 2018-04-02 ENCOUNTER — Ambulatory Visit (HOSPITAL_COMMUNITY)
Admission: EM | Admit: 2018-04-02 | Discharge: 2018-04-02 | Disposition: A | Discharge status: Home / Self Care | Payer: Medicaid Other | Attending: Internal Medicine | Admitting: Internal Medicine

## 2018-04-02 DIAGNOSIS — J069 Acute upper respiratory infection, unspecified: Secondary | ICD-10-CM | POA: Diagnosis not present

## 2018-04-02 DIAGNOSIS — B9789 Other viral agents as the cause of diseases classified elsewhere: Secondary | ICD-10-CM | POA: Diagnosis not present

## 2018-04-02 MED ORDER — ALBUTEROL SULFATE HFA 108 (90 BASE) MCG/ACT IN AERS: 1.0000 | INHALATION_SPRAY | Freq: Four times a day (QID) | RESPIRATORY_TRACT | 0 refills | Status: DC | PRN

## 2018-04-02 MED ORDER — BENZONATATE 100 MG PO CAPS: 100.0000 mg | ORAL_CAPSULE | Freq: Three times a day (TID) | ORAL | 0 refills | Status: DC

## 2018-04-02 NOTE — ED Triage Notes (Signed)
Pt cc cough, congestion, chills, diarrhea, and chest congestions.

## 2018-04-02 NOTE — Discharge Instructions (Signed)
Push fluids to ensure adequate hydration and keep secretions thin.  Tylenol and/or ibuprofen as needed for pain or fevers.  Tessalon as needed for cough.  May continue with mucinex as needed for symptoms.  Albuterol every 4-6 hours as needed for wheezing or shortness of breath .  If symptoms worsen or do not improve in the next week to return to be seen or to follow up with your PCP.

## 2018-04-02 NOTE — ED Provider Notes (Signed)
[facility]    CSN: [id number] Arrival date & time: 04/02/18  1515     History   Chief Complaint Chief Complaint  Patient presents with  . Influenza    HPI Catherine Osborne is a 43 y.o. female.   Catherine Osborne presents with complaints of headache, congestion, runny nose, sore throat, productive cough which causes chest pain , fatigue, emesis of green/yellow mucus and diarrhea yesterday. No blood in emesis or diarrhea. No fevers. Her children also have illness. Has used mucinex and her inhaler which have minimally helped. Sometimes feels shortness of breath. She does have asthma. Was seen in ER 2/29 with labs drawn, indicative of viral illness. Hasn't worsened but minimal improvement. Cough symptoms have worsened and gi symptoms have improved.     ROS per HPI.      Past Medical History:  Diagnosis Date  . Allergy   . Asthma   . Headache   . 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 Pneumonia   . Prior miscarriage with pregnancy in first trimester, antepartum   . Vaginal Pap smear, abnormal     Patient Active Problem List   Diagnosis Date Noted  . Seasonal allergies 01/18/2016  . Cigarette smoker 01/18/2016  . Compliance poor 01/18/2016  . Asthma 12/07/2015  . Goiter diffuse 12/07/2015  . NSVD (normal spontaneous vaginal delivery) 06/13/2015  . Pregnancy induced hypertension 06/12/2015  . Preeclampsia 06/10/2015  . Short cervix affecting pregnancy 02/25/2015  . Advanced maternal age in multigravida 02/12/2015  . Left thyroid nodule 10/25/2011    Past Surgical History:  Procedure Laterality Date  . CERVICAL CERCLAGE N/A 02/26/2015   Procedure: CERCLAGE CERVICAL;  Surgeon: Frederico Hamman, MD;  Location: [facility];  Service: Gynecology;  Laterality: N/A;  . DILATION AND CURETTAGE OF UTERUS    . DILATION AND EVACUATION N/A 05/22/2012   Procedure: DILATATION AND EVACUATION;  Surgeon: Frederico Hamman, MD;  Location: [facility];  Service: Gynecology;  Laterality: N/A;  .  FOOT SURGERY      OB History    Gravida  14   Para  2   Term  1   Preterm  1   AB  12   Living  1     SAB  10   TAB  2   Ectopic      Multiple  0   Live Births  1            Home Medications    Prior to Admission medications   Medication Sig Start Date End Date Taking? Authorizing Provider  albuterol (PROVENTIL HFA;VENTOLIN HFA) 108 (90 Base) MCG/ACT inhaler Inhale 1-2 puffs into the lungs every 6 (six) hours as needed for wheezing or shortness of breath. 04/02/18   Zigmund Gottron, NP  albuterol (PROVENTIL) (2.5 MG/3ML) 0.083% nebulizer solution Take 3 mLs (2.5 mg total) by nebulization every 6 (six) hours as needed for wheezing or shortness of breath. 10/08/15   Noe Gens, PA-C  beclomethasone (QVAR REDIHALER) 80 MCG/ACT inhaler Inhale 2 puffs into the lungs 2 (two) times daily. 02/15/17   Noralee Space, MD  benzonatate (TESSALON) 100 MG capsule Take 1 capsule (100 mg total) by mouth every 8 (eight) hours. 04/02/18   Zigmund Gottron, NP  fluticasone furoate-vilanterol (BREO ELLIPTA) 100-25 MCG/INH AEPB Inhale 1 puff into the lungs daily. 02/07/18   Martyn Ehrich, NP  metroNIDAZOLE (FLAGYL) 500 MG tablet Take 1 tablet (500 mg total) by mouth 2 (two)  times daily. 02/20/18   Shelly Bombard, MD  metroNIDAZOLE (METROGEL VAGINAL) 0.75 % vaginal gel Place 1 Applicatorful vaginally 2 (two) times daily. 08/16/17   Shelly Bombard, MD  varenicline (CHANTIX PAK) 0.5 MG X 11 & 1 MG X 42 tablet Take one 0.5 mg tablet by mouth once daily for 3 days, then increase to one 0.5 mg tablet twice daily for 4 days, then increase to one 1 mg tablet twice daily. 02/07/18   Martyn Ehrich, NP    Family History Family History  Adopted: Yes  Problem Relation Age of Onset  . Bone cancer Mother   . Breast cancer Maternal Grandmother   . Diabetes Other   . Hypertension Other   . Stroke Other   . Heart attack Other     Social History Social History   Tobacco Use  . Smoking  status: Former Smoker    Packs/day: 1.00    Years: 20.00    Pack years: 20.00    Types: Cigarettes    Last attempt to quit: 01/31/2018    Years since quitting: 0.1  . Smokeless tobacco: Never Used  . Tobacco comment: marijuana/30 mins ago  Substance Use Topics  . Alcohol use: Yes    Alcohol/week: 1.0 standard drinks    Types: 1 Glasses of wine per week    Comment: rare  . Drug use: Yes    Types: Marijuana    Comment: occasional use     Allergies   Patient has no known allergies.   Review of Systems Review of Systems   Physical Exam Triage Vital Signs ED Triage Vitals  Enc Vitals Group     BP 04/02/18 1601 122/84     Pulse Rate 04/02/18 1601 64     Resp 04/02/18 1601 18     Temp 04/02/18 1601 98.9 F (37.2 C)     Temp Source 04/02/18 1601 Oral     SpO2 04/02/18 1601 98 %     Weight --      Height --      Head Circumference --      Peak Flow --      Pain Score 04/02/18 1708 8     Pain Loc --      Pain Edu? --      Excl. in [place]? --    No data found.  Updated Vital Signs BP 122/84 (BP Location: Right Arm)   Pulse 64   Temp 98.9 F (37.2 C) (Oral)   Resp 18   LMP 02/26/2018   SpO2 98%    Physical Exam Constitutional:      General: She is not in acute distress.    Appearance: She is well-developed.  HENT:     Head: Normocephalic and atraumatic.     Right Ear: Tympanic membrane, ear canal and external ear normal.     Left Ear: Tympanic membrane, ear canal and external ear normal.     Nose: Nose normal.     Mouth/Throat:     Pharynx: Uvula midline.     Tonsils: No tonsillar exudate.  Eyes:     Conjunctiva/sclera: Conjunctivae normal.     Pupils: Pupils are equal, round, and reactive to light.  Cardiovascular:     Rate and Rhythm: Normal rate and regular rhythm.     Heart sounds: Normal heart sounds.  Pulmonary:     Effort: Pulmonary effort is normal.     Breath sounds: Normal breath sounds.  Comments: Occasional congested cough noted  Skin:     General: Skin is warm and dry.  Neurological:     Mental Status: She is alert and oriented to person, place, and time.      UC Treatments / Results  Labs (all labs ordered are listed, but only abnormal results are displayed) Labs Reviewed - No data to display  EKG None  Radiology No results found.  Procedures Procedures (including critical care time)  Medications Ordered in UC Medications - No data to display  Initial Impression / Assessment and Plan / UC Course  I have reviewed the triage vital signs and the nursing notes.  Pertinent labs & imaging results that were available during my care of the patient were reviewed by me and considered in my medical decision making (see chart for details).     Labs from 2/29 reviewed. Patient non toxic in appearance. Afebrile. History and physical consistent with viral illness.  Supportive cares recommended. Work note provided. Return precautions provided. Patient verbalized understanding and agreeable to plan.    Final Clinical Impressions(s) / UC Diagnoses   Final diagnoses:  Viral URI with cough     Discharge Instructions     Push fluids to ensure adequate hydration and keep secretions thin.  Tylenol and/or ibuprofen as needed for pain or fevers.  Tessalon as needed for cough.  May continue with mucinex as needed for symptoms.  Albuterol every 4-6 hours as needed for wheezing or shortness of breath .  If symptoms worsen or do not improve in the next week to return to be seen or to follow up with your PCP.     ED Prescriptions    Medication Sig Dispense Auth. Provider   albuterol (PROVENTIL HFA;VENTOLIN HFA) 108 (90 Base) MCG/ACT inhaler Inhale 1-2 puffs into the lungs every 6 (six) hours as needed for wheezing or shortness of breath. 1 Inhaler ,  B, NP   benzonatate (TESSALON) 100 MG capsule Take 1 capsule (100 mg total) by mouth every 8 (eight) hours. 21 capsule Zigmund Gottron, NP     Controlled  Substance Prescriptions [place] Controlled Substance Registry consulted? Not Applicable   Zigmund Gottron, NP 04/02/18 [phone]

## 2018-04-03 ENCOUNTER — Encounter: Payer: Self-pay | Admitting: Primary Care

## 2018-04-03 ENCOUNTER — Telehealth: Payer: Self-pay | Admitting: Primary Care

## 2018-04-03 ENCOUNTER — Ambulatory Visit (INDEPENDENT_AMBULATORY_CARE_PROVIDER_SITE_OTHER): Payer: Medicaid Other | Admitting: Primary Care

## 2018-04-03 ENCOUNTER — Encounter: Payer: Self-pay | Admitting: *Deleted

## 2018-04-03 VITALS — BP 92/62 | HR 75 | Temp 99.0°F | Ht 62.0 in | Wt 125.4 lb

## 2018-04-03 DIAGNOSIS — J111 Influenza due to unidentified influenza virus with other respiratory manifestations: Secondary | ICD-10-CM

## 2018-04-03 DIAGNOSIS — J209 Acute bronchitis, unspecified: Secondary | ICD-10-CM | POA: Insufficient documentation

## 2018-04-03 LAB — POCT INFLUENZA A/B
Influenza A, POC: NEGATIVE
Influenza B, POC: NEGATIVE

## 2018-04-03 NOTE — Assessment & Plan Note (Signed)
Seen today for URI/bronchitis symptoms, flu negtive Asthma does not appear to be exacerbated Recommend re-starting BREO inhaler and use proair rescue inhaler as needed RX Zpack sent  Continue Tessalon perles three times daily for cough and recommend Delsym twice daily for cough  Return if symptoms do not improve in 7 days

## 2018-04-03 NOTE — Progress Notes (Signed)
Reviewed and agree with assessment/plan.   Conley Pawling, MD [facility] 01/27/2016, 12:24 PM Pager:  [phone]  

## 2018-04-03 NOTE — Patient Instructions (Addendum)
Seen today for URI/bronchitis symptoms, flu negtive Asthma does not appear to be exacerbated Recommend re-starting BREO inhaler and use proair rescue inhaler as needed   Office testing: Flu swab was negative    Asthma: - Re-start Breo 1 puff daily - Back up- use proair rescue inhaer every 4-6 hours for wheezing/shortness of breath  Cough: - Continue Tessalon perles three times daily for cough - Delsym twice daily for cough   Rx: - Zpack as prescribed   Follow-up: - Return in 7-10 days if symptoms do not improve or breathing worsens     Needs spacer

## 2018-04-03 NOTE — Progress Notes (Signed)
@Patient  ID: Catherine Osborne, female    DOB: December 24, 1975, 43 y.o.   MRN: [id number]  Chief Complaint  Patient presents with  . Acute Visit    Referring provider: Care, Lenord Carbo*  HPI: 43 year old female, recent former smoker (quit 01/31/18). PMH significant for asthma, mild airway obstruction. Previous patient of Dr. Lenna Gilford, last seen by pulmonary NP on 02/07/18.  04/03/2018 Patient presents today with complaints of shortness of breath.  Accompanied by her younger son. She was recently seen on 2/29 in ED and again on 3/2 at [facility] for URI symptoms. Supportive care recommended and given tessalon perles for cough and proair rescue inhaler.  Complains of cough, shortness of breath and occasional wheezing x1 week. Cough is occasionally productive. Associated chills, sweats and diarrhea. She stopped smoking in January. Reports that her two sons and their father were recently sick. No confirmed influenza or travel. Only using her Breo as needed, has not used recently but reports that she has it in her car. Works at [employer], around children and needs note to be out of work.    Testing reviewed: 02/07/2018 Spirometry- FVC 3.8 (132%), FEV1 2.0 (85%), ratio 53  No Known Allergies  Immunization History  Administered Date(s) Administered  . Influenza,inj,Quad PF,6+ Mos 10/31/2016, 01/03/2018  . Tdap 06/13/2015    Past Medical History:  Diagnosis Date  . Allergy   . Asthma   . Headache   . 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 Pneumonia   . Prior miscarriage with pregnancy in first trimester, antepartum   . Vaginal Pap smear, abnormal     Tobacco History: Social History   Tobacco Use  Smoking Status Former Smoker  . Packs/day: 1.00  . Years: 20.00  . Pack years: 20.00  . Types: Cigarettes  . Last attempt to quit: 01/31/2018  . Years since quitting: 0.1  Smokeless Tobacco Never Used  Tobacco Comment   marijuana/30 mins ago   Counseling given: Not Answered Comment:  marijuana/30 mins ago   Outpatient Medications Prior to Visit  Medication Sig Dispense Refill  . albuterol (PROVENTIL HFA;VENTOLIN HFA) 108 (90 Base) MCG/ACT inhaler Inhale 1-2 puffs into the lungs every 6 (six) hours as needed for wheezing or shortness of breath. 1 Inhaler 0  . albuterol (PROVENTIL) (2.5 MG/3ML) 0.083% nebulizer solution Take 3 mLs (2.5 mg total) by nebulization every 6 (six) hours as needed for wheezing or shortness of breath. 75 mL 12  . benzonatate (TESSALON) 100 MG capsule Take 1 capsule (100 mg total) by mouth every 8 (eight) hours. 21 capsule 0  . metroNIDAZOLE (METROGEL VAGINAL) 0.75 % vaginal gel Place 1 Applicatorful vaginally 2 (two) times daily. 70 g 5  . beclomethasone (QVAR REDIHALER) 80 MCG/ACT inhaler Inhale 2 puffs into the lungs 2 (two) times daily. (Patient not taking: Reported on 04/03/2018) 1 Inhaler 2  . fluticasone furoate-vilanterol (BREO ELLIPTA) 100-25 MCG/INH AEPB Inhale 1 puff into the lungs daily. (Patient not taking: Reported on 04/03/2018) 28 each 1  . varenicline (CHANTIX PAK) 0.5 MG X 11 & 1 MG X 42 tablet Take one 0.5 mg tablet by mouth once daily for 3 days, then increase to one 0.5 mg tablet twice daily for 4 days, then increase to one 1 mg tablet twice daily. (Patient not taking: Reported on 04/03/2018) 53 tablet 0  . metroNIDAZOLE (FLAGYL) 500 MG tablet Take 1 tablet (500 mg total) by mouth 2 (two) times daily. (Patient not taking: Reported on 04/03/2018) 14  tablet 0   No facility-administered medications prior to visit.    Review of Systems  Review of Systems  Constitutional: Negative.   Respiratory: Positive for cough.    Physical Exam  BP 92/62 (BP Location: Right Arm, Cuff Size: Normal)   Pulse 75   Temp 99 F (37.2 C) (Oral)   Ht 5\' 2"  (1.575 m)   Wt 125 lb 6.4 oz (56.9 kg)   SpO2 97%   BMI 22.94 kg/m  Physical Exam Constitutional:      Appearance: Normal appearance. She is not ill-appearing.  HENT:     Head: Normocephalic and  atraumatic.     Right Ear: Tympanic membrane normal.     Left Ear: Tympanic membrane normal.     Mouth/Throat:     Mouth: Mucous membranes are moist.     Pharynx: Oropharynx is clear.  Eyes:     Conjunctiva/sclera: Conjunctivae normal.     Pupils: Pupils are equal, round, and reactive to light.  Cardiovascular:     Rate and Rhythm: Normal rate and regular rhythm.  Pulmonary:     Effort: Pulmonary effort is normal.     Breath sounds: No wheezing or rhonchi.  Musculoskeletal: Normal range of motion.  Skin:    General: Skin is warm and dry.  Neurological:     Mental Status: She is alert.  Psychiatric:        Mood and Affect: Mood normal.        Behavior: Behavior normal.        Thought Content: Thought content normal.        Judgment: Judgment normal.      Lab Results:  CBC    Component Value Date/Time   WBC 4.1 03/31/2018 1230   RBC 5.11 03/31/2018 1230   HGB 14.3 03/31/2018 1230   HCT 44.3 03/31/2018 1230   PLT 197 03/31/2018 1230   MCV 86.7 03/31/2018 1230   MCH 28.0 03/31/2018 1230   MCHC 32.3 03/31/2018 1230   RDW 14.0 03/31/2018 1230   LYMPHSABS 0.5 (L) 03/31/2018 1230   MONOABS 0.9 03/31/2018 1230   EOSABS 0.0 03/31/2018 1230   BASOSABS 0.0 03/31/2018 1230    BMET    Component Value Date/Time   NA 134 (L) 03/31/2018 1230   K 3.2 (L) 03/31/2018 1230   CL 102 03/31/2018 1230   CO2 20 (L) 03/31/2018 1230   GLUCOSE 88 03/31/2018 1230   BUN 8 03/31/2018 1230   CREATININE 0.72 03/31/2018 1230   CALCIUM 8.7 (L) 03/31/2018 1230   GFRNONAA >60 03/31/2018 1230   GFRAA >60 03/31/2018 1230    BNP No results found for: BNP  ProBNP No results found for: PROBNP  Imaging: No results found.   Assessment & Plan:   Bronchitis, acute Seen today for URI/bronchitis symptoms, flu negtive Asthma does not appear to be exacerbated Recommend re-starting BREO inhaler and use proair rescue inhaler as needed RX Zpack sent  Continue Tessalon perles three times  daily for cough and recommend Delsym twice daily for cough  Return if symptoms do not improve in 7 days       Martyn Ehrich, NP 04/03/2018

## 2018-04-03 NOTE — Telephone Encounter (Signed)
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

## 2018-04-04 NOTE — Telephone Encounter (Signed)
ATC, NA and no option to leave msg 

## 2018-04-04 NOTE — Telephone Encounter (Signed)
ATC Patient.  No answer, no VM.  Unable to leave message.  Will try again at a later time.

## 2018-04-05 NOTE — Telephone Encounter (Signed)
ATC no vm will call back

## 2018-05-21 ENCOUNTER — Telehealth: Payer: Self-pay | Admitting: Primary Care

## 2018-05-21 ENCOUNTER — Ambulatory Visit (INDEPENDENT_AMBULATORY_CARE_PROVIDER_SITE_OTHER): Payer: Medicaid Other | Admitting: Podiatry

## 2018-05-21 ENCOUNTER — Encounter: Payer: Self-pay | Admitting: Podiatry

## 2018-05-21 ENCOUNTER — Other Ambulatory Visit: Payer: Self-pay

## 2018-05-21 DIAGNOSIS — B070 Plantar wart: Secondary | ICD-10-CM

## 2018-05-21 DIAGNOSIS — D492 Neoplasm of unspecified behavior of bone, soft tissue, and skin: Secondary | ICD-10-CM | POA: Diagnosis not present

## 2018-05-21 MED ORDER — FLUTICASONE FUROATE-VILANTEROL 100-25 MCG/INH IN AEPB: 1.0000 | INHALATION_SPRAY | Freq: Every day | RESPIRATORY_TRACT | 5 refills | Status: DC

## 2018-05-21 NOTE — Progress Notes (Signed)
Subjective:   [name], female   DOB: 43 y.o.   MRN: [id number]   HPI Patient states that she was doing better but it has started to become a problem recently   ROS      Objective:  Physical Exam  Neurovascular status intact with large keratotic lesion sub-fifth metatarsal left with pinpoint bleeding     Assessment:  Verruca plantaris plantar fifth metatarsal left     Plan:  Sharp debridement accomplished and I applied chemical agent to create immune response with sterile dressing and instructed what to do if any blistering were to occur.  Reappoint for routine care and this will have to be taken care of periodically

## 2018-05-21 NOTE — Telephone Encounter (Signed)
Refill of pt's breo sent to pt's preferred pharmacy. Attempted to call pt to let her know that this had been done but unable to reach pt. Left a detailed message for pt letting her know this had been done. Nothing further needed.

## 2018-05-23 ENCOUNTER — Other Ambulatory Visit: Payer: Self-pay | Admitting: General Surgery

## 2018-05-23 DIAGNOSIS — J4520 Mild intermittent asthma, uncomplicated: Secondary | ICD-10-CM

## 2018-05-23 MED ORDER — FLUTICASONE FUROATE-VILANTEROL 100-25 MCG/INH IN AEPB: 1.0000 | INHALATION_SPRAY | Freq: Every day | RESPIRATORY_TRACT | 5 refills | Status: DC

## 2018-05-30 ENCOUNTER — Telehealth: Payer: Self-pay | Admitting: Primary Care

## 2018-05-30 DIAGNOSIS — J4520 Mild intermittent asthma, uncomplicated: Secondary | ICD-10-CM

## 2018-05-30 NOTE — Telephone Encounter (Signed)
Please send in Rx for Symbicort 80 - take two puffs twice a day Update medication list please. Thanks

## 2018-05-30 NOTE — Telephone Encounter (Signed)
PA request received from [facility] on [employer] [facility] requested: Breo 100 PA completed through [employer] d/t [employer].    Pt must try and fail at least 2 of the following preferred alternatives before [facility] is covered: [place]  Former SN pt last seen by BW.  Beth please advise on preferred alternative for pt.  Thanks!

## 2018-05-30 NOTE — Telephone Encounter (Signed)
I have removed Breo from pt's medication list. I have pended Rx for Sym 80. Attempted to contact pt to let her know this info from the PA and that we were changing her from [facility] to Symbicort but unable to reach pt. Left message for pt to return call. When pt does return call, we can then send Rx for Symbicort to pt's preferred pharmacy.

## 2018-05-31 MED ORDER — BUDESONIDE-FORMOTEROL FUMARATE 80-4.5 MCG/ACT IN AERO: 2.0000 | INHALATION_SPRAY | Freq: Two times a day (BID) | RESPIRATORY_TRACT | 12 refills | Status: DC

## 2018-05-31 NOTE — Telephone Encounter (Signed)
LVMTCB x 1 patient.

## 2018-05-31 NOTE — Telephone Encounter (Signed)
Still unable to reach the patient regarding change in medication that will be sent to the pharmacy.   Prescription sent with notation to pharmacy to notify patient as we are unable to reach her.  Nothing further needed at this time.

## 2018-06-04 ENCOUNTER — Telehealth: Payer: Self-pay | Admitting: Primary Care

## 2018-06-04 NOTE — Telephone Encounter (Signed)
LVM for patient to return call. X1 

## 2018-06-05 NOTE — Telephone Encounter (Signed)
ATC line rang until busy.

## 2018-06-05 NOTE — Telephone Encounter (Signed)
Insurance will not cover Breo. Changed to Symbicort 80 Unable to contact patient.

## 2018-06-06 NOTE — Telephone Encounter (Signed)
ATC line rang until busy then disconnected. Tried home # female answered then hung up. In a separate in encounter there have been several attempts to contact patient as to why the change in inhaler. In a previous message:Still unable to reach the patient regarding change in medication that will be sent to the pharmacy.   Prescription sent with notation to pharmacy to notify patient as we are unable to reach her.  Nothing further needed at this time.Danielle Dess  Her insurance will not cover Breo, she must try and fail at least two Symbicort,Dulera, Advair.  Message will be closed.

## 2018-06-13 ENCOUNTER — Ambulatory Visit: Payer: Medicaid Other | Admitting: Nurse Practitioner

## 2018-06-13 ENCOUNTER — Other Ambulatory Visit: Payer: Self-pay

## 2018-06-13 ENCOUNTER — Ambulatory Visit: Payer: Medicaid Other

## 2018-06-13 ENCOUNTER — Encounter: Payer: Self-pay | Admitting: Nurse Practitioner

## 2018-06-13 VITALS — BP 124/70 | HR 86 | Temp 98.9°F | Ht 62.0 in | Wt 129.4 lb

## 2018-06-13 DIAGNOSIS — R0602 Shortness of breath: Secondary | ICD-10-CM | POA: Diagnosis not present

## 2018-06-13 DIAGNOSIS — J4520 Mild intermittent asthma, uncomplicated: Secondary | ICD-10-CM | POA: Diagnosis not present

## 2018-06-13 MED ORDER — ALBUTEROL SULFATE (2.5 MG/3ML) 0.083% IN NEBU: 2.5000 mg | INHALATION_SOLUTION | Freq: Four times a day (QID) | RESPIRATORY_TRACT | 3 refills | Status: DC | PRN

## 2018-06-13 NOTE — Patient Instructions (Addendum)
Continue Symbicort 2 puffs twice daily  Will reorder albuterol as needed  Could not order chest x ray due to possible pregnancy Patient is to follow up with GYN ASAP for pregnancy test  Follow up: Follow up with Dr. Valeta Harms in 3 months or sooner if needed   What You Need to Know About Marijuana Use Marijuana is a mixture of the dried leaves and flowers of the hemp plant Cannabis sativa. The plant's active ingredients (cannabinoids) change the chemistry of the brain. If you smoke or eat marijuana, you will experience changes in the way you think, feel, and behave. Many people use marijuana because it helps them relax and puts them in a pleasurable mood (marijuana high). Some people use marijuana for medical effects, such as:  Reduced nausea.  Increased appetite.  Reduced muscle spasm.  Pain relief. Researchers are studying other possible medical uses for marijuana. How can marijuana use affect me? Many people find a marijuana high to be pleasurable and relaxing. Other people find a marijuana high to be uncomfortable or anxiety-causing. This drug can cause short-term and long-term physical and mental effects. Taking high doses of marijuana or trying to quit marijuana can also affect you. Short-term effects of marijuana use include:  Temporary relief of symptoms from a medical condition.  Changes in mood and perception (feeling high).  Increased hunger.  Increased heart rate.  Slowed movement and reaction time.  Poor memory, judgment, and problem solving ability.  Altered sense of time.  Changes to vision.  Bloodshot eyes.  Coughing. Long-term effects of marijuana use include:  Higher risk of lung and breathing problems.  Higher risk of heart attack.  Higher risk of testicular cancer.  Mental and physical dependence (addiction).  Slowed brain development in young people. Babies whose mothers used marijuana during pregnancy have an increased risk of problems with  brain development and behavior.  Temporary periods of false perceptions or beliefs (hallucinations or paranoia).  Worsening of mental illness.  Onset of new mental illness such as anxiety, depression, or suicidal thoughts.  Withdrawal symptoms when stopping marijuana, such as sleeplessness, anxiety, cravings, and anger.  Difficulty maintaining healthy relationships.  Poor memory, and difficulty concentrating and learning. This can result in decreased intelligence and poor performance at school or work, and an increased risk of dropping out of school.  Higher risk of using other substances like alcohol and nicotine. High doses of marijuana can cause:  Panic.  Anxiety.  Mental confusion.  Hallucinations. Quitting marijuana after using it for a long time can cause withdrawal symptoms, such as:  Headache.  Shakiness.  Sweating.  Stomach pain.  Nausea.  Restlessness.  Irritability.  Trouble sleeping.  Decreased appetite. What are the benefits of not using marijuana? Not using marijuana can keep you from becoming dependent on it. You can avoid the negative effects of the drug that can reduce your quality of life. You can avoid accidents caused by the slowed reaction time that is common with marijuana use. If I already use marijuana, what steps can I take to stop using it? If you are not physically or mentally dependent on marijuana, you should be able to stop using it on your own. If you cannot stop on your own, ask your health care provider for help. Treatment for marijuana addiction is similar to treatment for other addictions. It may include:  Cognitive-behavioral therapy (psychotherapy). This may include individual or group therapy.  Joining a support group.  Treating medical, behavioral, or mental health conditions that  exist along with marijuana dependency.  Where can I get more information? Learn more about:  Marijuana from the [employer] Drug  Abuse: MissingBag.si  Medical marijuana from the Portal: LocatorExpress.is  Treatment options from the Substance Abuse and [place]: https://findtreatment.[employer].com.au  Recovery from marijuana dependency from Recovery.org: http://www.recovery.org/topics/marijuana-recovery When should I seek medical care? Talk with your health care provider if:  You want to stop using marijuana but you cannot.  You have withdrawal symptoms when you try to stop using marijuana.  You are using marijuana every day.  You are using marijuana along with other drugs like cocaine or alcohol.  You have anxiety or depression.  You have hallucinations or paranoia.  Marijuana use is interfering with your relationships or your ability to function normally at school or at work. Summary  You may become physically or mentally dependent on marijuana.  Long-term use may interfere with your ability to function normally at home, school, or work.  Marijuana addiction is treatable. This information is not intended to replace advice given to you by your health care provider. Make sure you discuss any questions you have with your health care provider. Document Released: 01/09/2015 Document Revised: 10/07/2015 Document Reviewed: 10/07/2015 Elsevier Interactive Patient Education  2019 [employer].

## 2018-06-13 NOTE — Progress Notes (Signed)
@Patient  ID: Catherine Osborne, female    DOB: 1975/08/21, 43 y.o.   MRN: [id number]  Chief Complaint  Patient presents with   Shortness of Breath    Referring provider: Care, Lenord Carbo*  HPI 43 year old female former smoker (quit 01/31/18), asthma, mild airway obstruction who is a previous patient of Dr. Lenna Gilford.   Tests:  CXR 05/31/16 - No active cardiopulmonary disease.  OV 06/14/18 - acute shortness of breath and wheezing Patient presents today for an acute visit for shortness of breath and wheezing.  At her last visit she was prescribed Breo but unfortunately insurance would not cover this inhaler.  Patient was ordered Symbicort.  She states that she picked up the medication 2 days ago.  She has not been taking medication as prescribed.  She has only been taking 1 puff daily.  She states that she is having financial issues and has been using her son's albuterol nebulizer as needed because she did not have her prescription medications available.  She needs a refill on albuterol and a nebulizer laser machine ordered today.  Patient states that she no longer smokes cigarettes, but she is a heavy marijuana smoker. Denies f/c/s, n/v/d, hemoptysis, PND, leg swelling.     No Known Allergies  Immunization History  Administered Date(s) Administered   Influenza,inj,Quad PF,6+ Mos 10/31/2016, 01/03/2018   Tdap 06/13/2015    Past Medical History:  Diagnosis Date   Allergy    Asthma    Headache    Lactose intolerance in adult    Pneumonia    Prior miscarriage with pregnancy in first trimester, antepartum    Vaginal Pap smear, abnormal     Tobacco History: Social History   Tobacco Use  Smoking Status Former Smoker   Packs/day: 1.00   Years: 20.00   Pack years: 20.00   Types: Cigarettes   Last attempt to quit: 01/31/2018   Years since quitting: 0.3  Smokeless Tobacco Never Used  Tobacco Comment   marijuana/30 mins ago   Counseling given: Not  Answered Comment: marijuana/30 mins ago   Outpatient Encounter Medications as of 06/13/2018  Medication Sig   albuterol (PROVENTIL HFA;VENTOLIN HFA) 108 (90 Base) MCG/ACT inhaler Inhale 1-2 puffs into the lungs every 6 (six) hours as needed for wheezing or shortness of breath.   albuterol (PROVENTIL) (2.5 MG/3ML) 0.083% nebulizer solution Take 3 mLs (2.5 mg total) by nebulization every 6 (six) hours as needed for wheezing or shortness of breath.   benzonatate (TESSALON) 100 MG capsule Take 1 capsule (100 mg total) by mouth every 8 (eight) hours.   budesonide-formoterol (SYMBICORT) 80-4.5 MCG/ACT inhaler Inhale 2 puffs into the lungs 2 (two) times a day.   metroNIDAZOLE (METROGEL VAGINAL) 0.75 % vaginal gel Place 1 Applicatorful vaginally 2 (two) times daily.   varenicline (CHANTIX PAK) 0.5 MG X 11 & 1 MG X 42 tablet Take one 0.5 mg tablet by mouth once daily for 3 days, then increase to one 0.5 mg tablet twice daily for 4 days, then increase to one 1 mg tablet twice daily.   [DISCONTINUED] albuterol (PROVENTIL) (2.5 MG/3ML) 0.083% nebulizer solution Take 3 mLs (2.5 mg total) by nebulization every 6 (six) hours as needed for wheezing or shortness of breath.   beclomethasone (QVAR REDIHALER) 80 MCG/ACT inhaler Inhale 2 puffs into the lungs 2 (two) times daily. (Patient not taking: Reported on 06/13/2018)   No facility-administered encounter medications on file as of 06/13/2018.      Review of Systems  Review of Systems  Constitutional: Negative.  Negative for chills and fever.  HENT: Negative.   Respiratory: Positive for shortness of breath and wheezing. Negative for cough.   Cardiovascular: Negative.  Negative for chest pain, palpitations and leg swelling.  Gastrointestinal: Negative.   Allergic/Immunologic: Negative.   Neurological: Negative.   Psychiatric/Behavioral: Negative.        Physical Exam  BP 124/70 (BP Location: Left Arm, Patient Position: Sitting, Cuff Size:  Normal)    Pulse 86    Temp 98.9 F (37.2 C)    Ht 5\' 2"  (1.575 m)    Wt 129 lb 6.4 oz (58.7 kg)    LMP 05/03/2018 (LMP Unknown)    SpO2 98%    BMI 23.67 kg/m   Wt Readings from Last 5 Encounters:  06/13/18 129 lb 6.4 oz (58.7 kg)  04/03/18 125 lb 6.4 oz (56.9 kg)  02/14/18 130 lb (59 kg)  02/07/18 130 lb 6.4 oz (59.1 kg)  01/03/18 133 lb (60.3 kg)     Physical Exam Vitals signs and nursing note reviewed.  Constitutional:      General: She is not in acute distress.    Appearance: She is well-developed.  Cardiovascular:     Rate and Rhythm: Normal rate and regular rhythm.  Pulmonary:     Effort: Pulmonary effort is normal.     Breath sounds: Normal breath sounds. No wheezing or rhonchi.  Musculoskeletal:     Right lower leg: No edema.  Neurological:     Mental Status: She is alert and oriented to person, place, and time.       Assessment & Plan:   Asthma Patient presents today for an acute visit for shortness of breath and wheezing.  At her last visit she was prescribed Breo but unfortunately insurance would not cover this inhaler.  Patient was ordered Symbicort.  She states that she picked up the medication 2 days ago.  She has not been taking medication as prescribed.  She has only been taking 1 puff daily.  She states that she is having financial issues and has been using her son's albuterol nebulizer as needed because she did not have her prescription medications available.  She needs a refill on albuterol and a nebulizer laser machine ordered today.  Patient states that she no longer smokes cigarettes, but she is a heavy marijuana smoker.  We discussed the dangers of marijuana in office today.  I had planned on ordering a chest x-ray today but patient states that she may be pregnant.  We also discussed the proper way to use Symbicort.  We will refer patient to community health and wellness to get help with community resources and financial aid.   Patient Instructions   Continue Symbicort 2 puffs twice daily  Will reorder albuterol as needed  Could not order chest x ray due to possible pregnancy Patient is to follow up with GYN ASAP for pregnancy test  Follow up: Follow up with Dr. Valeta Harms in 3 months or sooner if needed   What You Need to Know About Marijuana Use Marijuana is a mixture of the dried leaves and flowers of the hemp plant Cannabis sativa. The plant's active ingredients (cannabinoids) change the chemistry of the brain. If you smoke or eat marijuana, you will experience changes in the way you think, feel, and behave. Many people use marijuana because it helps them relax and puts them in a pleasurable mood (marijuana high). Some people use marijuana for medical effects, such  as:  Reduced nausea.  Increased appetite.  Reduced muscle spasm.  Pain relief. Researchers are studying other possible medical uses for marijuana. How can marijuana use affect me? Many people find a marijuana high to be pleasurable and relaxing. Other people find a marijuana high to be uncomfortable or anxiety-causing. This drug can cause short-term and long-term physical and mental effects. Taking high doses of marijuana or trying to quit marijuana can also affect you. Short-term effects of marijuana use include:  Temporary relief of symptoms from a medical condition.  Changes in mood and perception (feeling high).  Increased hunger.  Increased heart rate.  Slowed movement and reaction time.  Poor memory, judgment, and problem solving ability.  Altered sense of time.  Changes to vision.  Bloodshot eyes.  Coughing. Long-term effects of marijuana use include:  Higher risk of lung and breathing problems.  Higher risk of heart attack.  Higher risk of testicular cancer.  Mental and physical dependence (addiction).  Slowed brain development in young people. Babies whose mothers used marijuana during pregnancy have an increased risk of problems with  brain development and behavior.  Temporary periods of false perceptions or beliefs (hallucinations or paranoia).  Worsening of mental illness.  Onset of new mental illness such as anxiety, depression, or suicidal thoughts.  Withdrawal symptoms when stopping marijuana, such as sleeplessness, anxiety, cravings, and anger.  Difficulty maintaining healthy relationships.  Poor memory, and difficulty concentrating and learning. This can result in decreased intelligence and poor performance at school or work, and an increased risk of dropping out of school.  Higher risk of using other substances like alcohol and nicotine. High doses of marijuana can cause:  Panic.  Anxiety.  Mental confusion.  Hallucinations. Quitting marijuana after using it for a long time can cause withdrawal symptoms, such as:  Headache.  Shakiness.  Sweating.  Stomach pain.  Nausea.  Restlessness.  Irritability.  Trouble sleeping.  Decreased appetite. What are the benefits of not using marijuana? Not using marijuana can keep you from becoming dependent on it. You can avoid the negative effects of the drug that can reduce your quality of life. You can avoid accidents caused by the slowed reaction time that is common with marijuana use. If I already use marijuana, what steps can I take to stop using it? If you are not physically or mentally dependent on marijuana, you should be able to stop using it on your own. If you cannot stop on your own, ask your health care provider for help. Treatment for marijuana addiction is similar to treatment for other addictions. It may include:  Cognitive-behavioral therapy (psychotherapy). This may include individual or group therapy.  Joining a support group.  Treating medical, behavioral, or mental health conditions that exist along with marijuana dependency.  Where can I get more information? Learn more about:  Marijuana from the [place] on Drug  Abuse: MissingBag.si  Medical marijuana from the [place]: LocatorExpress.is  Treatment options from the Substance Abuse and [place]: https://findtreatment.[employer].com.au  Recovery from marijuana dependency from Recovery.org: http://www.recovery.org/topics/marijuana-recovery When should I seek medical care? Talk with your health care provider if:  You want to stop using marijuana but you cannot.  You have withdrawal symptoms when you try to stop using marijuana.  You are using marijuana every day.  You are using marijuana along with other drugs like cocaine or alcohol.  You have anxiety or depression.  You have hallucinations or paranoia.  Marijuana use is interfering  with your relationships or your ability to function normally at school or at work. Summary  You may become physically or mentally dependent on marijuana.  Long-term use may interfere with your ability to function normally at home, school, or work.  Marijuana addiction is treatable. This information is not intended to replace advice given to you by your health care provider. Make sure you discuss any questions you have with your health care provider. Document Released: 01/09/2015 Document Revised: 10/07/2015 Document Reviewed: 10/07/2015 Elsevier Interactive Patient Education  2019 [place], [place] 06/14/2018

## 2018-06-14 ENCOUNTER — Encounter: Payer: Self-pay | Admitting: Nurse Practitioner

## 2018-06-14 ENCOUNTER — Other Ambulatory Visit: Payer: Self-pay | Admitting: General Surgery

## 2018-06-14 DIAGNOSIS — Z0000 Encounter for general adult medical examination without abnormal findings: Secondary | ICD-10-CM

## 2018-06-14 NOTE — Assessment & Plan Note (Signed)
Patient presents today for an acute visit for shortness of breath and wheezing.  At her last visit she was prescribed Breo but unfortunately insurance would not cover this inhaler.  Patient was ordered Symbicort.  She states that she picked up the medication 2 days ago.  She has not been taking medication as prescribed.  She has only been taking 1 puff daily.  She states that she is having financial issues and has been using her son's albuterol nebulizer as needed because she did not have her prescription medications available.  She needs a refill on albuterol and a nebulizer laser machine ordered today.  Patient states that she no longer smokes cigarettes, but she is a heavy marijuana smoker.  We discussed the dangers of marijuana in office today.  I had planned on ordering a chest x-ray today but patient states that she may be pregnant.  We also discussed the proper way to use Symbicort.  We will refer patient to community health and wellness to get help with community resources and financial aid.   Patient Instructions  Continue Symbicort 2 puffs twice daily  Will reorder albuterol as needed  Could not order chest x ray due to possible pregnancy Patient is to follow up with GYN ASAP for pregnancy test  Follow up: Follow up with Dr. Valeta Harms in 3 months or sooner if needed   What You Need to Know About Marijuana Use Marijuana is a mixture of the dried leaves and flowers of the hemp plant Cannabis sativa. The plant's active ingredients (cannabinoids) change the chemistry of the brain. If you smoke or eat marijuana, you will experience changes in the way you think, feel, and behave. Many people use marijuana because it helps them relax and puts them in a pleasurable mood (marijuana high). Some people use marijuana for medical effects, such as:  Reduced nausea.  Increased appetite.  Reduced muscle spasm.  Pain relief. Researchers are studying other possible medical uses for marijuana. How can  marijuana use affect me? Many people find a marijuana high to be pleasurable and relaxing. Other people find a marijuana high to be uncomfortable or anxiety-causing. This drug can cause short-term and long-term physical and mental effects. Taking high doses of marijuana or trying to quit marijuana can also affect you. Short-term effects of marijuana use include:  Temporary relief of symptoms from a medical condition.  Changes in mood and perception (feeling high).  Increased hunger.  Increased heart rate.  Slowed movement and reaction time.  Poor memory, judgment, and problem solving ability.  Altered sense of time.  Changes to vision.  Bloodshot eyes.  Coughing. Long-term effects of marijuana use include:  Higher risk of lung and breathing problems.  Higher risk of heart attack.  Higher risk of testicular cancer.  Mental and physical dependence (addiction).  Slowed brain development in young people. Babies whose mothers used marijuana during pregnancy have an increased risk of problems with brain development and behavior.  Temporary periods of false perceptions or beliefs (hallucinations or paranoia).  Worsening of mental illness.  Onset of new mental illness such as anxiety, depression, or suicidal thoughts.  Withdrawal symptoms when stopping marijuana, such as sleeplessness, anxiety, cravings, and anger.  Difficulty maintaining healthy relationships.  Poor memory, and difficulty concentrating and learning. This can result in decreased intelligence and poor performance at school or work, and an increased risk of dropping out of school.  Higher risk of using other substances like alcohol and nicotine. High doses of marijuana can  cause:  Panic.  Anxiety.  Mental confusion.  Hallucinations. Quitting marijuana after using it for a long time can cause withdrawal symptoms, such as:  Headache.  Shakiness.  Sweating.  Stomach pain.  Nausea.   Restlessness.  Irritability.  Trouble sleeping.  Decreased appetite. What are the benefits of not using marijuana? Not using marijuana can keep you from becoming dependent on it. You can avoid the negative effects of the drug that can reduce your quality of life. You can avoid accidents caused by the slowed reaction time that is common with marijuana use. If I already use marijuana, what steps can I take to stop using it? If you are not physically or mentally dependent on marijuana, you should be able to stop using it on your own. If you cannot stop on your own, ask your health care provider for help. Treatment for marijuana addiction is similar to treatment for other addictions. It may include:  Cognitive-behavioral therapy (psychotherapy). This may include individual or group therapy.  Joining a support group.  Treating medical, behavioral, or mental health conditions that exist along with marijuana dependency.  Where can I get more information? Learn more about:  Marijuana from the [place] on Drug Abuse: MissingBag.si  Medical marijuana from the [place]: LocatorExpress.is  Treatment options from the Substance Abuse and [place]: https://findtreatment.[employer].com.au  Recovery from marijuana dependency from Recovery.org: http://www.recovery.org/topics/marijuana-recovery When should I seek medical care? Talk with your health care provider if:  You want to stop using marijuana but you cannot.  You have withdrawal symptoms when you try to stop using marijuana.  You are using marijuana every day.  You are using marijuana along with other drugs like cocaine or alcohol.  You have anxiety or depression.  You have hallucinations or paranoia.  Marijuana use is interfering with your relationships or your ability to function normally at school or at work.  Summary  You may become physically or mentally dependent on marijuana.  Long-term use may interfere with your ability to function normally at home, school, or work.  Marijuana addiction is treatable. This information is not intended to replace advice given to you by your health care provider. Make sure you discuss any questions you have with your health care provider. Document Released: 01/09/2015 Document Revised: 10/07/2015 Document Reviewed: 10/07/2015 Elsevier Interactive Patient Education  2019 [employer].

## 2018-06-14 NOTE — Progress Notes (Signed)
am

## 2018-06-19 NOTE — Progress Notes (Signed)
PCCM: Agree. I will be glad to establish care. Once back in the office.  Garner Nash, DO [facility] 06/19/2018 3:40 PM

## 2018-06-20 ENCOUNTER — Other Ambulatory Visit: Payer: Self-pay

## 2018-06-20 ENCOUNTER — Ambulatory Visit (INDEPENDENT_AMBULATORY_CARE_PROVIDER_SITE_OTHER): Payer: Medicaid Other

## 2018-06-20 DIAGNOSIS — Z3202 Encounter for pregnancy test, result negative: Secondary | ICD-10-CM

## 2018-06-20 DIAGNOSIS — L0292 Furuncle, unspecified: Secondary | ICD-10-CM

## 2018-06-20 DIAGNOSIS — Z3200 Encounter for pregnancy test, result unknown: Secondary | ICD-10-CM

## 2018-06-20 LAB — POCT URINE PREGNANCY: Preg Test, Ur: NEGATIVE

## 2018-06-20 MED ORDER — CLINDAMYCIN HCL 300 MG PO CAPS: 300.0000 mg | ORAL_CAPSULE | Freq: Three times a day (TID) | ORAL | 2 refills | Status: DC

## 2018-06-20 NOTE — Progress Notes (Addendum)
Pt is in the office for UPT, test is negative today. Pt states that she also had a negative test at home. Pt reports that she knows that she is pregnant because this is how it happen last time and she complains of nausea. Pt reports she had 2 cycles in April on 4-2 and 4-27. Provider approved for pt to get blood work done today.

## 2018-06-21 LAB — HCG, SERUM, QUALITATIVE: hCG,Beta Subunit,Qual,Serum: NEGATIVE m[IU]/mL (ref <6)

## 2018-06-22 ENCOUNTER — Telehealth: Payer: Self-pay | Admitting: Nurse Practitioner

## 2018-06-22 DIAGNOSIS — R0602 Shortness of breath: Secondary | ICD-10-CM

## 2018-06-22 DIAGNOSIS — J4520 Mild intermittent asthma, uncomplicated: Secondary | ICD-10-CM

## 2018-06-22 MED ORDER — ALBUTEROL SULFATE (2.5 MG/3ML) 0.083% IN NEBU: 2.5000 mg | INHALATION_SOLUTION | Freq: Four times a day (QID) | RESPIRATORY_TRACT | 3 refills | Status: DC | PRN

## 2018-06-22 NOTE — Telephone Encounter (Signed)
Rx was printed on 06/13/2018  I went ahead and sent it electronically to the [facility] [address]  ATC the pt and inform her- line rang several times and then turned busy, [facility]

## 2018-06-29 ENCOUNTER — Other Ambulatory Visit: Payer: Medicaid Other

## 2018-07-03 ENCOUNTER — Encounter (HOSPITAL_COMMUNITY): Payer: Self-pay

## 2018-07-03 ENCOUNTER — Emergency Department (HOSPITAL_COMMUNITY)
Admission: EM | Admit: 2018-07-03 | Discharge: 2018-07-03 | Disposition: A | Discharge status: Home / Self Care | Payer: Medicaid Other | Attending: Emergency Medicine | Admitting: Emergency Medicine

## 2018-07-03 ENCOUNTER — Other Ambulatory Visit: Payer: Self-pay

## 2018-07-03 DIAGNOSIS — R109 Unspecified abdominal pain: Secondary | ICD-10-CM | POA: Diagnosis present

## 2018-07-03 DIAGNOSIS — Z79899 Other long term (current) drug therapy: Secondary | ICD-10-CM | POA: Insufficient documentation

## 2018-07-03 DIAGNOSIS — J45909 Unspecified asthma, uncomplicated: Secondary | ICD-10-CM | POA: Diagnosis not present

## 2018-07-03 DIAGNOSIS — Z87891 Personal history of nicotine dependence: Secondary | ICD-10-CM | POA: Diagnosis not present

## 2018-07-03 DIAGNOSIS — K2900 Acute gastritis without bleeding: Secondary | ICD-10-CM | POA: Diagnosis not present

## 2018-07-03 LAB — CBC WITH DIFFERENTIAL/PLATELET
Abs Immature Granulocytes: 0.03 10*3/uL (ref 0.00–0.07)
Basophils Absolute: 0.1 10*3/uL (ref 0.0–0.1)
Basophils Relative: 1 %
Eosinophils Absolute: 0.2 10*3/uL (ref 0.0–0.5)
Eosinophils Relative: 2 %
HCT: 47.8 % — ABNORMAL HIGH (ref 36.0–46.0)
Hemoglobin: 15.8 g/dL — ABNORMAL HIGH (ref 12.0–15.0)
Immature Granulocytes: 0 %
Lymphocytes Relative: 28 %
Lymphs Abs: 2.4 10*3/uL (ref 0.7–4.0)
MCH: 28.9 pg (ref 26.0–34.0)
MCHC: 33.1 g/dL (ref 30.0–36.0)
MCV: 87.5 fL (ref 80.0–100.0)
Monocytes Absolute: 0.7 10*3/uL (ref 0.1–1.0)
Monocytes Relative: 8 %
Neutro Abs: 5.3 10*3/uL (ref 1.7–7.7)
Neutrophils Relative %: 61 %
Platelets: 302 10*3/uL (ref 150–400)
RBC: 5.46 MIL/uL — ABNORMAL HIGH (ref 3.87–5.11)
RDW: 13.7 % (ref 11.5–15.5)
WBC: 8.7 10*3/uL (ref 4.0–10.5)
nRBC: 0 % (ref 0.0–0.2)

## 2018-07-03 LAB — URINALYSIS, ROUTINE W REFLEX MICROSCOPIC
Bilirubin Urine: NEGATIVE
Glucose, UA: NEGATIVE mg/dL
Ketones, ur: NEGATIVE mg/dL
Nitrite: NEGATIVE
Protein, ur: NEGATIVE mg/dL
Specific Gravity, Urine: 1.012 (ref 1.005–1.030)
pH: 6 (ref 5.0–8.0)

## 2018-07-03 LAB — COMPREHENSIVE METABOLIC PANEL
ALT: 21 U/L (ref 0–44)
AST: 24 U/L (ref 15–41)
Albumin: 3.9 g/dL (ref 3.5–5.0)
Alkaline Phosphatase: 79 U/L (ref 38–126)
Anion gap: 10 (ref 5–15)
BUN: <5 mg/dL — ABNORMAL LOW (ref 6–20)
CO2: 25 mmol/L (ref 22–32)
Calcium: 9.4 mg/dL (ref 8.9–10.3)
Chloride: 102 mmol/L (ref 98–111)
Creatinine, Ser: 0.73 mg/dL (ref 0.44–1.00)
GFR calc Af Amer: >60 mL/min (ref >60)
GFR calc non Af Amer: >60 mL/min (ref >60)
Glucose, Bld: 91 mg/dL (ref 70–99)
Potassium: 3.8 mmol/L (ref 3.5–5.1)
Sodium: 137 mmol/L (ref 135–145)
Total Bilirubin: 0.5 mg/dL (ref 0.3–1.2)
Total Protein: 7 g/dL (ref 6.5–8.1)

## 2018-07-03 LAB — POC URINE PREG, ED: Preg Test, Ur: NEGATIVE

## 2018-07-03 LAB — PREGNANCY, URINE: Preg Test, Ur: NEGATIVE

## 2018-07-03 LAB — LIPASE, BLOOD: Lipase: 29 U/L (ref 11–51)

## 2018-07-03 MED ORDER — OMEPRAZOLE 20 MG PO CPDR: 20.0000 mg | DELAYED_RELEASE_CAPSULE | Freq: Every day | ORAL | 0 refills | Status: DC

## 2018-07-03 MED ORDER — ALUM & MAG HYDROXIDE-SIMETH 200-200-20 MG/5ML PO SUSP
30.0000 mL | Freq: Once | ORAL | Status: AC
  Administered 2018-07-03: 30 mL via ORAL
  Filled 2018-07-03: qty 30

## 2018-07-03 MED ORDER — LIDOCAINE VISCOUS HCL 2 % MT SOLN
15.0000 mL | Freq: Once | OROMUCOSAL | Status: AC
  Administered 2018-07-03: 15 mL via ORAL
  Filled 2018-07-03: qty 15

## 2018-07-03 NOTE — Discharge Instructions (Signed)
Thank you for allowing me to care for you today. Please return to the emergency department if you have new or worsening symptoms. Take your medications as instructed.  ° °

## 2018-07-03 NOTE — ED Provider Notes (Signed)
[facility] EMERGENCY DEPARTMENT Provider Note   CSN: [id number] Arrival date & time: 07/03/18  9629    History   Chief Complaint Chief Complaint  Patient presents with  . Abdominal Pain  . Anorexia    HPI Catherine Osborne is a 43 y.o. female.     Patient is a 43 year old female past medical history of asthma presenting to the emergency department for abdominal pain.  Patient reports that about 1 month ago she began to have generalized abdominal pain.  Reports that she has felt like this before in the past and that when this has happened she was pregnant.  She reports that she thinks she is pregnant.  She also reports that she has been drinking heavily every night because she has been stressed at home.  She reports nausea with occasional vomiting.  Reports some diarrhea which is nonbloody.  Denies any fever, chills, dysuria, vaginal discharge or vaginal bleeding.  She reports that she has had irregular periods in the last 2 months.  Describes the pain as aching in the epigastric area, without exacerbating or relieving factors.      Past Medical History:  Diagnosis Date  . Allergy   . Asthma   . Headache   . 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 Pneumonia   . Prior miscarriage with pregnancy in first trimester, antepartum   . Vaginal Pap smear, abnormal     Patient Active Problem List   Diagnosis Date Noted  . Bronchitis, acute 04/03/2018  . Seasonal allergies 01/18/2016  . Cigarette smoker 01/18/2016  . Compliance poor 01/18/2016  . Asthma 12/07/2015  . Goiter diffuse 12/07/2015  . NSVD (normal spontaneous vaginal delivery) 06/13/2015  . Pregnancy induced hypertension 06/12/2015  . Preeclampsia 06/10/2015  . Short cervix affecting pregnancy 02/25/2015  . Advanced maternal age in multigravida 02/12/2015  . Left thyroid nodule 10/25/2011    Past Surgical History:  Procedure Laterality Date  . CERVICAL CERCLAGE N/A 02/26/2015   Procedure: CERCLAGE  CERVICAL;  Surgeon: Frederico Hamman, MD;  Location: [facility];  Service: Gynecology;  Laterality: N/A;  . DILATION AND CURETTAGE OF UTERUS    . DILATION AND EVACUATION N/A 05/22/2012   Procedure: DILATATION AND EVACUATION;  Surgeon: Frederico Hamman, MD;  Location: [facility];  Service: Gynecology;  Laterality: N/A;  . FOOT SURGERY       OB History    Gravida  14   Para  2   Term  1   Preterm  1   AB  12   Living  1     SAB  10   TAB  2   Ectopic      Multiple  0   Live Births  1            Home Medications    Prior to Admission medications   Medication Sig Start Date End Date Taking? Authorizing Provider  albuterol (PROVENTIL HFA;VENTOLIN HFA) 108 (90 Base) MCG/ACT inhaler Inhale 1-2 puffs into the lungs every 6 (six) hours as needed for wheezing or shortness of breath. 04/02/18  Yes Augusto Gamble B, NP  albuterol (PROVENTIL) (2.5 MG/3ML) 0.083% nebulizer solution Take 3 mLs (2.5 mg total) by nebulization every 6 (six) hours as needed for wheezing or shortness of breath. 06/22/18  Yes Fenton Foy, NP  beclomethasone (QVAR REDIHALER) 80 MCG/ACT inhaler Inhale 2 puffs into the lungs 2 (two) times daily. 02/15/17  Yes Noralee Space, MD  benzonatate [facility])  100 MG capsule Take 1 capsule (100 mg total) by mouth every 8 (eight) hours. Patient not taking: Reported on 06/20/2018 04/02/18   Augusto Gamble B, NP  budesonide-formoterol (SYMBICORT) 80-4.5 MCG/ACT inhaler Inhale 2 puffs into the lungs 2 (two) times a day. Patient not taking: Reported on 06/20/2018 05/31/18   Martyn Ehrich, NP  clindamycin (CLEOCIN) 300 MG capsule Take 1 capsule (300 mg total) by mouth 3 (three) times daily. Patient not taking: Reported on 07/03/2018 06/20/18   Shelly Bombard, MD  omeprazole (PRILOSEC) 20 MG capsule Take 1 capsule (20 mg total) by mouth daily for 30 days. 07/03/18 08/02/18  Alveria Apley, PA-C  varenicline (CHANTIX PAK) 0.5 MG X 11 & 1 MG X 42 tablet Take one 0.5 mg tablet by  mouth once daily for 3 days, then increase to one 0.5 mg tablet twice daily for 4 days, then increase to one 1 mg tablet twice daily. Patient not taking: Reported on 06/20/2018 02/07/18   Martyn Ehrich, NP    Family History Family History  Adopted: Yes  Problem Relation Age of Onset  . Bone cancer Mother   . Breast cancer Maternal Grandmother   . Diabetes Other   . Hypertension Other   . Stroke Other   . Heart attack Other     Social History Social History   Tobacco Use  . Smoking status: Former Smoker    Packs/day: 1.00    Years: 20.00    Pack years: 20.00    Types: Cigarettes    Last attempt to quit: 01/31/2018    Years since quitting: 0.4  . Smokeless tobacco: Never Used  . Tobacco comment: marijuana/30 mins ago  Substance Use Topics  . Alcohol use: Yes    Alcohol/week: 1.0 standard drinks    Types: 1 Glasses of wine per week    Comment: rare  . Drug use: Yes    Types: Marijuana    Comment: occasional use     Allergies   Patient has no known allergies.   Review of Systems Review of Systems  Constitutional: Positive for appetite change. Negative for activity change, chills, diaphoresis, fatigue, fever and unexpected weight change.  HENT: Negative for congestion.   Respiratory: Negative for cough and shortness of breath.   Gastrointestinal: Positive for diarrhea, nausea and vomiting. Negative for abdominal distention, abdominal pain, blood in stool, constipation and rectal pain.  Genitourinary: Positive for menstrual problem. Negative for decreased urine volume, dysuria, hematuria, vaginal bleeding, vaginal discharge and vaginal pain.  Musculoskeletal: Negative for back pain.  Skin: Negative for rash.  Neurological: Negative for dizziness, light-headedness and headaches.     Physical Exam Updated Vital Signs BP 105/68   Pulse 80   Temp 98.5 F (36.9 C) (Oral)   Resp 17   Ht 5\' 2"  (1.575 m)   Wt 60.8 kg   LMP 06/22/2018 (Exact Date)   SpO2 99%   BMI  24.51 kg/m   Physical Exam   ED Treatments / Results  Labs (all labs ordered are listed, but only abnormal results are displayed) Labs Reviewed  URINALYSIS, ROUTINE W REFLEX MICROSCOPIC - Abnormal; Notable for the following components:      Result Value   APPearance HAZY (*)    Hgb urine dipstick SMALL (*)    Leukocytes,Ua SMALL (*)    Bacteria, UA RARE (*)    All other components within normal limits  CBC WITH DIFFERENTIAL/PLATELET - Abnormal; Notable for the following components:  RBC 5.46 (*)    Hemoglobin 15.8 (*)    HCT 47.8 (*)    All other components within normal limits  COMPREHENSIVE METABOLIC PANEL - Abnormal; Notable for the following components:   BUN <5 (*)    All other components within normal limits  PREGNANCY, URINE  LIPASE, BLOOD  POC URINE PREG, ED    EKG None  Radiology No results found.  Procedures Procedures (including critical care time)  Medications Ordered in ED Medications  alum & mag hydroxide-simeth (MAALOX/MYLANTA) 200-200-20 MG/5ML suspension 30 mL (30 mLs Oral Given 07/03/18 1158)    And  lidocaine (XYLOCAINE) 2 % viscous mouth solution 15 mL (15 mLs Oral Given 07/03/18 1158)     Initial Impression / Assessment and Plan / ED Course  I have reviewed the triage vital signs and the nursing notes.  Pertinent labs & imaging results that were available during my care of the patient were reviewed by me and considered in my medical decision making (see chart for details).  Clinical Course as of Jul 02 1208  Tue Jul 03, 2018  8546 Likely alcoholic gastritis without bleeding. Labs reassuring. Pregnancy test negative. Patient resting comfortably in bed. Will give GI cocktail and she was advised to quit drinking alcohol.    [KM]  1209 Patient sent home with rx for omeprazole. She was tearful when I was discharging her and stated she was stressed out. Denies SI orHI. I offered her outpatient resources for counseling but she declined.    [KM]     Clinical Course User Index [KM] Alveria Apley, PA-C      Based on review of vitals, medical screening exam, lab work and/or imaging, there does not appear to be an acute, emergent etiology for the patient's symptoms. Counseled pt on good return precautions and encouraged both PCP and ED follow-up as needed.  Prior to discharge, I also discussed incidental imaging findings with patient in detail and advised appropriate, recommended follow-up in detail.  Clinical Impression: 1. Other acute gastritis without hemorrhage     Disposition: Discharge  Prior to providing a prescription for a controlled substance, I independently reviewed the patient's recent prescription history on the [facility]. The patient had no recent or regular prescriptions and was deemed appropriate for a brief, less than 3 day prescription of narcotic for acute analgesia.  This note was prepared with assistance of Systems analyst. Occasional wrong-word or sound-a-like substitutions may have occurred due to the inherent limitations of voice recognition software.   Final Clinical Impressions(s) / ED Diagnoses   Final diagnoses:  Other acute gastritis without hemorrhage    ED Discharge Orders         Ordered    omeprazole (PRILOSEC) 20 MG capsule  Daily     07/03/18 1209           Kristine Royal 07/03/18 1211    Davonna Belling, MD 07/03/18 1510

## 2018-07-03 NOTE — ED Notes (Signed)
Pt discharged with all belongings. Discharge instructions reviewed with pt, pt verbalized understanding. Pt ambulatory at discharge, pt drove herself to ED and will be driving herself home. Opportunity for questions provided.

## 2018-07-03 NOTE — ED Triage Notes (Signed)
Pt arrived to EMS c/o abdominal pain x 2 weeks. Pain states pain 10/10 radiating from top to bottom. N/V/D x 2 weeks.

## 2018-07-03 NOTE — ED Notes (Signed)
Urine Culture collected and sent to main lab. 

## 2018-07-05 ENCOUNTER — Emergency Department (HOSPITAL_COMMUNITY): Payer: Medicaid Other

## 2018-07-05 ENCOUNTER — Other Ambulatory Visit: Payer: Self-pay

## 2018-07-05 ENCOUNTER — Encounter (HOSPITAL_COMMUNITY): Payer: Self-pay | Admitting: Pharmacy Technician

## 2018-07-05 ENCOUNTER — Emergency Department (HOSPITAL_COMMUNITY)
Admission: EM | Admit: 2018-07-05 | Discharge: 2018-07-05 | Disposition: A | Discharge status: Home / Self Care | Payer: Medicaid Other | Attending: Emergency Medicine | Admitting: Emergency Medicine

## 2018-07-05 DIAGNOSIS — R1084 Generalized abdominal pain: Secondary | ICD-10-CM | POA: Insufficient documentation

## 2018-07-05 LAB — CBC WITH DIFFERENTIAL/PLATELET
Abs Immature Granulocytes: 0.02 10*3/uL (ref 0.00–0.07)
Basophils Absolute: 0.1 10*3/uL (ref 0.0–0.1)
Basophils Relative: 1 %
Eosinophils Absolute: 0.2 10*3/uL (ref 0.0–0.5)
Eosinophils Relative: 3 %
HCT: 42.9 % (ref 36.0–46.0)
Hemoglobin: 14.2 g/dL (ref 12.0–15.0)
Immature Granulocytes: 0 %
Lymphocytes Relative: 38 %
Lymphs Abs: 2.8 10*3/uL (ref 0.7–4.0)
MCH: 29.3 pg (ref 26.0–34.0)
MCHC: 33.1 g/dL (ref 30.0–36.0)
MCV: 88.5 fL (ref 80.0–100.0)
Monocytes Absolute: 0.7 10*3/uL (ref 0.1–1.0)
Monocytes Relative: 10 %
Neutro Abs: 3.7 10*3/uL (ref 1.7–7.7)
Neutrophils Relative %: 48 %
Platelets: 269 10*3/uL (ref 150–400)
RBC: 4.85 MIL/uL (ref 3.87–5.11)
RDW: 13.6 % (ref 11.5–15.5)
WBC: 7.5 10*3/uL (ref 4.0–10.5)
nRBC: 0 % (ref 0.0–0.2)

## 2018-07-05 LAB — SALICYLATE LEVEL: Salicylate Lvl: <7 mg/dL (ref 2.8–30.0)

## 2018-07-05 LAB — COMPREHENSIVE METABOLIC PANEL
ALT: 18 U/L (ref 0–44)
AST: 20 U/L (ref 15–41)
Albumin: 3.4 g/dL — ABNORMAL LOW (ref 3.5–5.0)
Alkaline Phosphatase: 63 U/L (ref 38–126)
Anion gap: 7 (ref 5–15)
BUN: 8 mg/dL (ref 6–20)
CO2: 23 mmol/L (ref 22–32)
Calcium: 8.8 mg/dL — ABNORMAL LOW (ref 8.9–10.3)
Chloride: 107 mmol/L (ref 98–111)
Creatinine, Ser: 0.73 mg/dL (ref 0.44–1.00)
GFR calc Af Amer: >60 mL/min (ref >60)
GFR calc non Af Amer: >60 mL/min (ref >60)
Glucose, Bld: 110 mg/dL — ABNORMAL HIGH (ref 70–99)
Potassium: 3.2 mmol/L — ABNORMAL LOW (ref 3.5–5.1)
Sodium: 137 mmol/L (ref 135–145)
Total Bilirubin: 0.4 mg/dL (ref 0.3–1.2)
Total Protein: 6.4 g/dL — ABNORMAL LOW (ref 6.5–8.1)

## 2018-07-05 LAB — LIPASE, BLOOD: Lipase: 34 U/L (ref 11–51)

## 2018-07-05 LAB — I-STAT BETA HCG BLOOD, ED (MC, WL, AP ONLY): I-stat hCG, quantitative: <5 m[IU]/mL (ref <5)

## 2018-07-05 LAB — ETHANOL: Alcohol, Ethyl (B): <10 mg/dL (ref <10)

## 2018-07-05 MED ORDER — LACTATED RINGERS IV BOLUS
1000.0000 mL | Freq: Once | INTRAVENOUS | Status: AC
  Administered 2018-07-05: 1000 mL via INTRAVENOUS

## 2018-07-05 MED ORDER — ONDANSETRON HCL 4 MG PO TABS: 4.0000 mg | ORAL_TABLET | Freq: Three times a day (TID) | ORAL | 0 refills | Status: DC | PRN

## 2018-07-05 MED ORDER — HALOPERIDOL LACTATE 5 MG/ML IJ SOLN
2.5000 mg | Freq: Once | INTRAMUSCULAR | Status: AC
  Administered 2018-07-05: 2.5 mg via INTRAVENOUS
  Filled 2018-07-05: qty 1

## 2018-07-05 MED ORDER — IOHEXOL 300 MG/ML  SOLN
100.0000 mL | Freq: Once | INTRAMUSCULAR | Status: AC | PRN
  Administered 2018-07-05: 100 mL via INTRAVENOUS

## 2018-07-05 NOTE — Discharge Instructions (Signed)
Please follow up with PCP as soon as possible for further evaluation and care.  Please use anti emetic for symptomatic control.  Please return to ED if symptoms worsen or if any new concerning symptoms arise.   - 13 mm hypodense left renal lesion, indeterminate. Further evaluation with dedicated renal mass protocol CT and/or MRI recommended. This could be performed on a nonemergent basis.  - 4 mm nonobstructive left renal nephrolithiasis.  - 6 mm right lower lobe nodule, indeterminate. Non-contrast chest CT at 6-12 months is recommended. If the nodule is stable at time of repeat CT, then future CT at 18-24 months (from today's scan) is considered optional for low-risk patients, but is recommended for high-risk patients.

## 2018-07-05 NOTE — ED Provider Notes (Signed)
[facility] EMERGENCY DEPARTMENT Provider Note   CSN: [id number] Arrival date & time: 07/05/18  2054    History   Chief Complaint Chief Complaint  Patient presents with  . Abdominal Pain    HPI Catherine Osborne is a 43 y.o. female.     The history is provided by the patient and medical records.  Abdominal Pain  Pain location:  Generalized Pain quality: aching, cramping and dull   Pain radiates to:  Does not radiate Pain severity:  Mild Onset quality:  Gradual Duration:  3 weeks Timing:  Intermittent Progression:  Unchanged Chronicity:  Recurrent Context: awakening from sleep and eating   Context: not medication withdrawal, not recent illness, not recent travel, not sick contacts, not suspicious food intake and not trauma   Relieved by:  Nothing Worsened by:  Palpation, movement and position changes Ineffective treatments:  Not moving, lying down, eating, liquids, heat, cold, bowel activity and flatus Associated symptoms: fatigue and nausea   Associated symptoms: no chest pain, no chills, no cough, no diarrhea, no dysuria, no fever, no hematemesis, no hematochezia, no hematuria, no melena, no shortness of breath, no vaginal bleeding, no vaginal discharge and no vomiting   Risk factors: has not had multiple surgeries and not obese     Past Medical History:  Diagnosis Date  . Allergy   . Asthma   . Headache   . 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 Pneumonia   . Prior miscarriage with pregnancy in first trimester, antepartum   . Vaginal Pap smear, abnormal     Patient Active Problem List   Diagnosis Date Noted  . Bronchitis, acute 04/03/2018  . Seasonal allergies 01/18/2016  . Cigarette smoker 01/18/2016  . Compliance poor 01/18/2016  . Asthma 12/07/2015  . Goiter diffuse 12/07/2015  . NSVD (normal spontaneous vaginal delivery) 06/13/2015  . Pregnancy induced hypertension 06/12/2015  . Preeclampsia 06/10/2015  . Short cervix affecting pregnancy  02/25/2015  . Advanced maternal age in multigravida 02/12/2015  . Left thyroid nodule 10/25/2011    Past Surgical History:  Procedure Laterality Date  . CERVICAL CERCLAGE N/A 02/26/2015   Procedure: CERCLAGE CERVICAL;  Surgeon: Frederico Hamman, MD;  Location: [facility];  Service: Gynecology;  Laterality: N/A;  . DILATION AND CURETTAGE OF UTERUS    . DILATION AND EVACUATION N/A 05/22/2012   Procedure: DILATATION AND EVACUATION;  Surgeon: Frederico Hamman, MD;  Location: [facility];  Service: Gynecology;  Laterality: N/A;  . FOOT SURGERY       OB History    Gravida  14   Para  2   Term  1   Preterm  1   AB  12   Living  1     SAB  10   TAB  2   Ectopic      Multiple  0   Live Births  1            Home Medications    Prior to Admission medications   Medication Sig Start Date End Date Taking? Authorizing Provider  albuterol (PROVENTIL HFA;VENTOLIN HFA) 108 (90 Base) MCG/ACT inhaler Inhale 1-2 puffs into the lungs every 6 (six) hours as needed for wheezing or shortness of breath. 04/02/18   Zigmund Gottron, NP  albuterol (PROVENTIL) (2.5 MG/3ML) 0.083% nebulizer solution Take 3 mLs (2.5 mg total) by nebulization every 6 (six) hours as needed for wheezing or shortness of breath. 06/22/18   Fenton Foy, NP  beclomethasone (QVAR REDIHALER) 80 MCG/ACT inhaler Inhale 2 puffs into the lungs 2 (two) times daily. 02/15/17   Noralee Space, MD  benzonatate (TESSALON) 100 MG capsule Take 1 capsule (100 mg total) by mouth every 8 (eight) hours. Patient not taking: Reported on 06/20/2018 04/02/18   Augusto Gamble B, NP  budesonide-formoterol (SYMBICORT) 80-4.5 MCG/ACT inhaler Inhale 2 puffs into the lungs 2 (two) times a day. Patient not taking: Reported on 06/20/2018 05/31/18   Martyn Ehrich, NP  clindamycin (CLEOCIN) 300 MG capsule Take 1 capsule (300 mg total) by mouth 3 (three) times daily. Patient not taking: Reported on 07/03/2018 06/20/18   Shelly Bombard, MD  omeprazole  (PRILOSEC) 20 MG capsule Take 1 capsule (20 mg total) by mouth daily for 30 days. 07/03/18 08/02/18  Madilyn Hook A, PA-C  ondansetron (ZOFRAN) 4 MG tablet Take 1 tablet (4 mg total) by mouth every 8 (eight) hours as needed for nausea or vomiting. 07/05/18   Lonzo Candy, MD  varenicline (CHANTIX PAK) 0.5 MG X 11 & 1 MG X 42 tablet Take one 0.5 mg tablet by mouth once daily for 3 days, then increase to one 0.5 mg tablet twice daily for 4 days, then increase to one 1 mg tablet twice daily. Patient not taking: Reported on 06/20/2018 02/07/18   Martyn Ehrich, NP    Family History Family History  Adopted: Yes  Problem Relation Age of Onset  . Bone cancer Mother   . Breast cancer Maternal Grandmother   . Diabetes Other   . Hypertension Other   . Stroke Other   . Heart attack Other     Social History Social History   Tobacco Use  . Smoking status: Former Smoker    Packs/day: 1.00    Years: 20.00    Pack years: 20.00    Types: Cigarettes    Last attempt to quit: 01/31/2018    Years since quitting: 0.4  . Smokeless tobacco: Never Used  . Tobacco comment: marijuana/30 mins ago  Substance Use Topics  . Alcohol use: Yes    Alcohol/week: 1.0 standard drinks    Types: 1 Glasses of wine per week    Comment: rare  . Drug use: Yes    Types: Marijuana    Comment: occasional use     Allergies   Patient has no known allergies.   Review of Systems Review of Systems  Constitutional: Positive for fatigue. Negative for chills and fever.  Respiratory: Negative for cough and shortness of breath.   Cardiovascular: Negative for chest pain.  Gastrointestinal: Positive for abdominal pain and nausea. Negative for diarrhea, hematemesis, hematochezia, melena and vomiting.  Genitourinary: Negative for dysuria, hematuria, vaginal bleeding and vaginal discharge.  All other systems reviewed and are negative.    Physical Exam Updated Vital Signs BP 126/83   Pulse 99   Temp 99.1 F (37.3 C)  (Oral)   Resp 16   LMP 06/22/2018 (Exact Date)   SpO2 100%   Physical Exam Vitals signs and nursing note reviewed.  Constitutional:      Appearance: She is well-developed.  HENT:     Head: Normocephalic and atraumatic.  Eyes:     Conjunctiva/sclera: Conjunctivae normal.  Neck:     Musculoskeletal: Neck supple.  Cardiovascular:     Rate and Rhythm: Normal rate.  Pulmonary:     Effort: Pulmonary effort is normal.     Breath sounds: No stridor. No wheezing, rhonchi or rales.  Abdominal:  Palpations: Abdomen is soft.     Tenderness: There is generalized abdominal tenderness. There is no right CVA tenderness, left CVA tenderness, guarding or rebound. Negative signs include Murphy's sign, Rovsing's sign and McBurney's sign.  Skin:    General: Skin is warm and dry.     Capillary Refill: Capillary refill takes less than 2 seconds.     Findings: No rash.  Neurological:     Mental Status: She is alert and oriented to person, place, and time.      ED Treatments / Results  Labs (all labs ordered are listed, but only abnormal results are displayed) Labs Reviewed  COMPREHENSIVE METABOLIC PANEL - Abnormal; Notable for the following components:      Result Value   Potassium 3.2 (*)    Glucose, Bld 110 (*)    Calcium 8.8 (*)    Total Protein 6.4 (*)    Albumin 3.4 (*)    All other components within normal limits  CBC WITH DIFFERENTIAL/PLATELET  LIPASE, BLOOD  SALICYLATE LEVEL  ETHANOL  CBC WITH DIFFERENTIAL/PLATELET  RAPID URINE DRUG SCREEN, HOSP PERFORMED  URINALYSIS, ROUTINE W REFLEX MICROSCOPIC  I-STAT BETA HCG BLOOD, ED ([facility], [facility], [facility] ONLY)    EKG None  Radiology Ct Abdomen Pelvis W Contrast  Result Date: 07/05/2018 CLINICAL DATA:  Initial evaluation for acute abdominal pain with distension for 3 days. EXAM: CT ABDOMEN AND PELVIS WITH CONTRAST TECHNIQUE: Multidetector CT imaging of the abdomen and pelvis was performed using the standard protocol following bolus  administration of intravenous contrast. CONTRAST:  133mL OMNIPAQUE IOHEXOL 300 MG/ML  SOLN COMPARISON:  Prior CT from 12/04/2016. FINDINGS: Lower chest: 6 mm nodular density at the mid right lung base, indeterminate. Visualized lung bases are otherwise clear. Hepatobiliary: Subcentimeter hypodensity within the right hepatic lobe too small the characterize, but could reflect a small cyst or benign hemangioma. Finding of doubtful significance. Liver otherwise demonstrates a normal contrast enhanced appearance. Gallbladder within normal limits. No biliary dilatation. Pancreas: Pancreas within normal limits. Spleen: Spleen within normal limits. Adrenals/Urinary Tract: Adrenal glands are normal. Kidneys equal in size with symmetric enhancement. 13 mm hypodense cystic lesion at the lower pole the left kidney measures intermediate density (70-80 Hounsfield units), indeterminate. Adjacent 4 mm nonobstructive calculus. No right-sided calculi. No hydronephrosis. No other focal enhancing renal mass. No appreciable hydroureter. Partially distended bladder within normal limits. Stomach/Bowel: Stomach moderately distended with fluid and ingested material within the gastric lumen. Stomach otherwise unremarkable. No evidence for bowel obstruction. Scattered mildly prominent fluid-filled loops of small bowel seen scattered throughout the abdomen, nonspecific, but can be seen in the setting of an underlying enteritis. Normal appendix. No other acute inflammatory changes seen about the bowels. Vascular/Lymphatic: Normal intravascular enhancement seen throughout the intra-abdominal aorta. Mesenteric vessels patent proximally. No adenopathy. Reproductive: Uterus and ovaries within normal limits. Other: Small volume free fluid within the pelvis, which could be physiologic and/or reactive. No free intraperitoneal air. Musculoskeletal: No acute osseous abnormality. No discrete lytic or blastic osseous lesions. Prominent facet arthrosis  noted at L5-S1. IMPRESSION: 1. Mildly prominent fluid-filled loops of small bowel scattered throughout the abdomen, nonspecific, but can be seen in the setting of an underlying acute enteritis. No evidence for associated obstruction or other acute intra-abdominal or pelvic process. 2. 13 mm hypodense left renal lesion, indeterminate. Further evaluation with dedicated renal mass protocol CT and/or MRI recommended. This could be performed on a nonemergent basis. 3. 4 mm nonobstructive left renal nephrolithiasis. 4. Small volume free fluid  within the pelvis, which may be physiologic and/or reactive. 5. 6 mm right lower lobe nodule, indeterminate. Non-contrast chest CT at 6-12 months is recommended. If the nodule is stable at time of repeat CT, then future CT at 18-24 months (from today's scan) is considered optional for low-risk patients, but is recommended for high-risk patients. This recommendation follows the consensus statement: Guidelines for Management of Incidental Pulmonary Nodules Detected on CT Images: From the Fleischner Society 2017; Radiology 2017; 284:228-243. Electronically Signed   By: Jeannine Boga M.D.   On: 07/05/2018 22:57    Procedures Procedures (including critical care time)  Medications Ordered in ED Medications  lactated ringers bolus 1,000 mL (1,000 mLs Intravenous New Bag/Given 07/05/18 2234)  haloperidol lactate (HALDOL) injection 2.5 mg (2.5 mg Intravenous Given 07/05/18 2231)  iohexol (OMNIPAQUE) 300 MG/ML solution 100 mL (100 mLs Intravenous Contrast Given 07/05/18 2219)     Initial Impression / Assessment and Plan / ED Course  I have reviewed the triage vital signs and the nursing notes.  Pertinent labs & imaging results that were available during my care of the patient were reviewed by me and considered in my medical decision making (see chart for details).        Medical Decision Making:  Catherine Osborne is a 43 y.o. female who presented to the ED today with  abdominal pain.  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 On my initial exam, the pt was calm, alert and interactive, not tachycardic, not hypotensive, no increased work of breathing or respiratory distress, GCS 15, afebrile.  No overt signs of peritonitis on physical exam. Recurrent abdominal pain over the last few weeks, concerned that she is pregnant, states that previously her pregnancies have not been detected in her blood or urine until the third trimester.  No infectious symptoms, no fevers, chills, dysuria, no vaginal discharge or bleeding, last menstrual period last month, menstrual cycles are irregular.  Seen in emergency department 2 days ago with reassuring blood work, urine studies showed no evidence of infection.  Emergent causes such as ectopic pregnancy, appendicitis, cholelithiasis, cholangitis, mesenteric ischemia, pelvic inflammatory disease all considered. We will treat patient symptomatically, will recheck blood work, obtain CT scan, will defer urine studies as patient had negative urine studies 2 days ago Pregnancy test negative. CT scan negative for any emergent process, shows nephrolithiasis, incidental findings, related to patient, patient will follow-up as an outpatient basis for further evaluation and care. All radiology and laboratory studies reviewed independently and with my attending physician, agree with reading provided by radiologist unless otherwise noted.  Upon reassessing patient, patient was calm, symptoms improved, patient tolerating po intake without emesis.  Antiemetic prescribed for home use. Based on the above findings, I believe patient is hemodynamically stable for discharge.  Patient states she has a safe place to care for discharge, feels comfortable with discharge home.   Denies any thoughts of wanting to harm herself or anyone else, denies suicidal ideation. Patient educated about specific return precautions for given chief complaint and symptoms.  Patient educated about follow-up with PCP.  Patient expressed understanding of return precautions and need for follow-up.  Patient discharged. The above care was discussed with and agreed upon by my attending physician. Emergency Department Medication Summary:  Medications  lactated ringers bolus 1,000 mL (1,000 mLs Intravenous New Bag/Given 07/05/18 2234)  haloperidol lactate (HALDOL) injection 2.5 mg (2.5 mg Intravenous Given 07/05/18 2231)  iohexol (OMNIPAQUE) 300 MG/ML solution 100 mL (100 mLs Intravenous Contrast Given 07/05/18 2219)      Lonzo Candy, MD 07/05/18 2329    Drenda Freeze, MD 07/12/18 [phone]

## 2018-07-05 NOTE — ED Notes (Signed)
Patient transported to CT 

## 2018-07-05 NOTE — ED Triage Notes (Signed)
Pt arrives via ems from home with abd pain and distention. Pt states she is on clindamycin. Also states she might be pregnant. VSS with EMS.

## 2018-07-05 NOTE — ED Notes (Signed)
Patient Alert and oriented to baseline. Stable and ambulatory to baseline. Patient verbalized understanding of the discharge instructions.  Patient belongings were taken by the patient.   

## 2018-07-18 ENCOUNTER — Ambulatory Visit: Payer: Medicaid Other | Admitting: Podiatry

## 2018-07-18 ENCOUNTER — Other Ambulatory Visit: Payer: Self-pay | Admitting: Podiatry

## 2018-07-18 ENCOUNTER — Encounter: Payer: Self-pay | Admitting: Podiatry

## 2018-07-18 ENCOUNTER — Other Ambulatory Visit: Payer: Self-pay

## 2018-07-18 ENCOUNTER — Ambulatory Visit (INDEPENDENT_AMBULATORY_CARE_PROVIDER_SITE_OTHER): Payer: Medicaid Other

## 2018-07-18 DIAGNOSIS — B070 Plantar wart: Secondary | ICD-10-CM

## 2018-07-18 DIAGNOSIS — M779 Enthesopathy, unspecified: Secondary | ICD-10-CM

## 2018-07-18 DIAGNOSIS — M79672 Pain in left foot: Secondary | ICD-10-CM

## 2018-07-18 DIAGNOSIS — M7752 Other enthesopathy of left foot: Secondary | ICD-10-CM

## 2018-07-18 DIAGNOSIS — Q828 Other specified congenital malformations of skin: Secondary | ICD-10-CM

## 2018-07-18 DIAGNOSIS — D492 Neoplasm of unspecified behavior of bone, soft tissue, and skin: Secondary | ICD-10-CM | POA: Diagnosis not present

## 2018-07-25 NOTE — Progress Notes (Signed)
Subjective:   [name], female   DOB: 43 y.o.   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

## 2018-08-09 ENCOUNTER — Telehealth: Payer: Self-pay | Admitting: Nurse Practitioner

## 2018-08-09 NOTE — Telephone Encounter (Signed)
ATC pt, line continued to ring without VM. WCB.

## 2018-08-09 NOTE — Telephone Encounter (Signed)
Attempted to call pt but line rang and rang and no machine ever kicked in to leave a message on. Will try to call back later.

## 2018-08-10 NOTE — Telephone Encounter (Signed)
ATC, NA and No VM

## 2018-08-10 NOTE — Telephone Encounter (Signed)
ATC pt, line rang busy. Pt does not have an emergency contact on file, otherwise I would have tried to call that number. Pt also only has one number on file: [phone]. Many attempts have been made to contact this patient, as seen in the previous messages. Per triage protocol, I will close this encounter. Nothing further needed at this time.

## 2018-08-20 ENCOUNTER — Telehealth: Payer: Self-pay | Admitting: Nurse Practitioner

## 2018-08-20 NOTE — Telephone Encounter (Signed)
Patient calling to follow up on the 7.9.2020 phone note  Spoke with patient who reports having questions about diagnoses she received at her 6.4.2020 ED visit.  Patient stated that she was told she has "lesions on her liver and a hole in her lung".  Patient also stated that when she got home from said ED visit she had a CPAP machine waiting at her home.  This office has never tested nor treated patient for sleep and there is no documentation from this office or otherwise ordering a CPAP machine.  Asked patient what company name was on the machine so we could figure it out, but patient informed me that "they already picked it up about 3 weeks ago."  Regarding patient's other concerns, the ED note from 6.4.2020 notes renal lesion on the abd CT but patient stated she has already followed up for this with her PCP.  The same CT notes lung nodule RLL but this CMA did not discuss said findings with patient.  Appt scheduled with Aaron Edelman NP for tomorrow 7.21.2020 @ 0930 to discuss CT findings and follow up.  Nothing further needed at this time; will sign off.

## 2018-08-20 NOTE — Progress Notes (Signed)
@Patient  ID: Catherine Osborne, female    DOB: [birth year], 43 y.o.   MRN: [id number]  Chief Complaint  Patient presents with  . Follow-up    CT abdomen follow-up    Referring provider: Care, Jinny Blossom Tota*  HPI:  43 year old female current every day marijuana smoker (30 blunts a day) and every day illicit drug user, former cigarette smoker (quit January/2020).  Patient also with recent CT abdomen in January/2020 which showed a 6 mm right lower lobe nodule.  Followed in our office for asthma.  PMH: Substance abuse history, depression, allergies,  Smoker/ Smoking History: Former Smoker. 1 ppd. 15 pack year.  Maintenance: Symbicort 80 Pt of: Dr. Halford Chessman   08/21/2018  - Visit   43 year old female current every day marijuana and drug user, former cigarette smoker (quit January/2020).  Presenting to our office today for follow-up with concerns regarding recent CT abdomen completed in January/2020 at emergency room.  CT abdomen showed a 6 mm right lower lobe nodule.  Recommending CT follow-up.  Also showed concerns regarding renal mass.  She reports that she is attempted to follow-up with her primary care doctor regarding this but no MRI has been ordered and that primary care was referring to a specialist.  Patient is unable to give me an update regarding when she is post to see her specialist.  She reports that she is struggling to receive updates from her primary care provider.  She is planning on stopping by their office today.  CT abdomen was completed on June/05/2018.  Those results are in patient's chart.  Patient is also reporting that she carries a personal history of MS.  She reports that this was diagnosed by her primary care provider.  She does not have a neurologist who she is following.   Patient is managed here with a diagnosis of asthma.  She has not had formal pulmonary function testing.  In office spirometry did show obstruction.  She is managed on Symbicort 80.  Patient reports she  has been out of this for a couple of days.  She also reports that "everybody in my house uses inhalers and will use each others".  Patient is unsure which inhaler she has been using she reports that it is the "red one".  She also reports that she has a pro-air inhaler that she uses 1 time daily.  She reports that she is waking up in the middle the night sometimes wheezing.    Patient has a significant history regarding drug abuse.  She reports that she is smoking 30 blunts of marijuana a day.  She also reports that she is snorting cocaine and using "M&Ms".  Patient reports that she receives these from her boyfriend.  Patient reports that she has attempted drug abuse counseling in the past and she is able to "pass all of the classes and leave the class and go smoke weed".  Patient reports that all of her friends smoke with her.  She also has a history of alcohol abuse usually tequila.  She reports that she has stopped drinking tequila often and now just drinks wine.  Patient is a difficult historian when performing a review of systems and HPI during exam today.    Tests:   07/05/2018-CT abdomen pelvis with contrast- 6 mm right lower lobe nodule, indeterminate, noncontrast chest CT at 6 to 12 months is recommended if the nodule stable at time of repeat CT then future CT at 18 to 24 months from today's scan  is considered optional for low risk patients but recommended for high risk patients  02/07/2018-spirometry - FVC 3.81 (132% predicted, ratio 53, FEV1 2 (85% predicted)  FENO:  No results found for: NITRICOXIDE  PFT: No flowsheet data found.  Imaging: No results found.    Specialty Problems      Pulmonary Problems   Asthma    02/07/2018-spirometry - FVC 3.81 (132% predicted, ratio 53, FEV1 2 (85% predicted)       Bronchitis, acute   Shortness of breath      No Known Allergies  Immunization History  Administered Date(s) Administered  . Influenza,inj,Quad PF,6+ Mos 10/31/2016,  01/03/2018  . Tdap 06/13/2015    Past Medical History:  Diagnosis Date  . Allergy   . Asthma   . Headache   . 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 Pneumonia   . Prior miscarriage with pregnancy in first trimester, antepartum   . Vaginal Pap smear, abnormal     Tobacco History: Social History   Tobacco Use  Smoking Status Former Smoker  . Packs/day: 0.75  . Years: 20.00  . Pack years: 15.00  . Types: Cigarettes  . Quit date: 01/31/2018  . Years since quitting: 0.5  Smokeless Tobacco Never Used  Tobacco Comment   marijuana/30 mins ago   Counseling given: Yes Comment: marijuana/30 mins ago  Smoking assessment and cessation counseling  Patient currently smoking: Patient is stop cigarette use, still smokes 30 once a day I have advised the patient to quit/stop smoking as soon as possible due to high risk for multiple medical problems.  It will also be very difficult for [place] to manage patient's  respiratory symptoms and status if we continue to expose her lungs to a known irritant.  We do not advise e-cigarettes as a form of stopping smoking.  Patient is not willing to quit smoking.  I have advised the patient that we can assist and have options of nicotine replacement therapy, provided smoking cessation education today, provided smoking cessation counseling, and provided cessation resources.  Follow-up next office visit office visit for assessment of smoking cessation.    Smoking cessation counseling advised for: 12 min     Outpatient Encounter Medications as of 08/21/2018  Medication Sig  . albuterol (PROVENTIL) (2.5 MG/3ML) 0.083% nebulizer solution Take 3 mLs (2.5 mg total) by nebulization every 6 (six) hours as needed for wheezing or shortness of breath.  Marland Kitchen albuterol (VENTOLIN HFA) 108 (90 Base) MCG/ACT inhaler Inhale 1-2 puffs into the lungs every 6 (six) hours as needed for wheezing or shortness of breath.  . benzonatate (TESSALON) 100 MG capsule Take 1 capsule (100 mg  total) by mouth every 8 (eight) hours.  . budesonide-formoterol (SYMBICORT) 80-4.5 MCG/ACT inhaler Inhale 2 puffs into the lungs 2 (two) times a day.  . varenicline (CHANTIX PAK) 0.5 MG X 11 & 1 MG X 42 tablet Take one 0.5 mg tablet by mouth once daily for 3 days, then increase to one 0.5 mg tablet twice daily for 4 days, then increase to one 1 mg tablet twice daily.  . [DISCONTINUED] albuterol (PROVENTIL HFA;VENTOLIN HFA) 108 (90 Base) MCG/ACT inhaler Inhale 1-2 puffs into the lungs every 6 (six) hours as needed for wheezing or shortness of breath.  . [DISCONTINUED] albuterol (PROVENTIL) (2.5 MG/3ML) 0.083% nebulizer solution Take 3 mLs (2.5 mg total) by nebulization every 6 (six) hours as needed for wheezing or shortness of breath.  . [DISCONTINUED] beclomethasone (QVAR REDIHALER) 80 MCG/ACT inhaler Inhale 2 puffs  into the lungs 2 (two) times daily.  . budesonide-formoterol (SYMBICORT) 80-4.5 MCG/ACT inhaler Inhale 2 puffs into the lungs 2 (two) times a day.  . budesonide-formoterol (SYMBICORT) 80-4.5 MCG/ACT inhaler Inhale 2 puffs into the lungs 2 (two) times a day.  Marland Kitchen omeprazole (PRILOSEC) 20 MG capsule Take 1 capsule (20 mg total) by mouth daily for 30 days.  . ondansetron (ZOFRAN) 4 MG tablet Take 1 tablet (4 mg total) by mouth every 8 (eight) hours as needed for nausea or vomiting. (Patient not taking: Reported on 08/21/2018)  . [DISCONTINUED] clindamycin (CLEOCIN) 300 MG capsule Take 1 capsule (300 mg total) by mouth 3 (three) times daily. (Patient not taking: Reported on 08/21/2018)   No facility-administered encounter medications on file as of 08/21/2018.      Review of Systems  Review of Systems  Constitutional: Negative for activity change, fatigue and fever.  HENT: Negative for sinus pressure, sinus pain and sore throat.   Respiratory: Positive for cough, chest tightness, shortness of breath and wheezing.        Waking up often at night with issues with her breathing requiring use of  her pro-air inhaler  Cardiovascular: Negative for chest pain and palpitations.  Gastrointestinal: Negative for diarrhea, nausea and vomiting.  Musculoskeletal: Negative for arthralgias.  Neurological: Negative for dizziness.       Patient reports she has a personal history of MS which was diagnosed by her primary care provider  Psychiatric/Behavioral: Positive for dysphoric mood and sleep disturbance. Negative for suicidal ideas. The patient is nervous/anxious.        Illicit drug user     Physical Exam  BP 112/66 (BP Location: Left Arm, Patient Position: Sitting, Cuff Size: Normal)   Pulse 82   Temp 98.5 F (36.9 C)   Ht 5\' 2"  (1.575 m)   Wt 126 lb (57.2 kg)   SpO2 97%   BMI 23.05 kg/m   Wt Readings from Last 5 Encounters:  08/21/18 126 lb (57.2 kg)  07/03/18 134 lb (60.8 kg)  06/13/18 129 lb 6.4 oz (58.7 kg)  04/03/18 125 lb 6.4 oz (56.9 kg)  02/14/18 130 lb (59 kg)    Physical Exam Vitals signs and nursing note reviewed.  Constitutional:      General: She is not in acute distress.    Appearance: Normal appearance.  HENT:     Head: Normocephalic and atraumatic.     Right Ear: Tympanic membrane, ear canal and external ear normal. There is no impacted cerumen.     Left Ear: Tympanic membrane, ear canal and external ear normal. There is no impacted cerumen.     Nose: Mucosal edema, congestion and rhinorrhea present.     Mouth/Throat:     Mouth: Mucous membranes are moist.     Pharynx: Oropharynx is clear.  Eyes:     Pupils: Pupils are equal, round, and reactive to light.  Neck:     Musculoskeletal: Normal range of motion.  Cardiovascular:     Rate and Rhythm: Normal rate and regular rhythm.     Pulses: Normal pulses.     Heart sounds: Normal heart sounds. No murmur.  Pulmonary:     Effort: Pulmonary effort is normal. No respiratory distress.     Breath sounds: Normal breath sounds. No decreased air movement. No decreased breath sounds, wheezing or rales.   Abdominal:     General: Abdomen is flat. Bowel sounds are normal.     Palpations: Abdomen is soft.  Skin:  General: Skin is warm and dry.     Capillary Refill: Capillary refill takes less than 2 seconds.  Neurological:     General: No focal deficit present.     Mental Status: She is alert and oriented to person, place, and time. Mental status is at baseline.     Gait: Gait normal.  Psychiatric:        Attention and Perception: Attention normal.        Mood and Affect: Mood is anxious and depressed. Affect is tearful.        Speech: Speech normal.        Behavior: Behavior normal.        Thought Content: Thought content normal.        Judgment: Judgment normal.     Comments: Patient tearful throughout entire exam, patient reporting that she is depressed due to MS diagnosis, patient also reports that she copes by utilizing illicit drugs often.      Lab Results:  CBC    Component Value Date/Time   WBC 7.5 07/05/2018 2108   RBC 4.85 07/05/2018 2108   HGB 14.2 07/05/2018 2108   HCT 42.9 07/05/2018 2108   PLT 269 07/05/2018 2108   MCV 88.5 07/05/2018 2108   MCH 29.3 07/05/2018 2108   MCHC 33.1 07/05/2018 2108   RDW 13.6 07/05/2018 2108   LYMPHSABS 2.8 07/05/2018 2108   MONOABS 0.7 07/05/2018 2108   EOSABS 0.2 07/05/2018 2108   BASOSABS 0.1 07/05/2018 2108    BMET    Component Value Date/Time   NA 137 07/05/2018 2108   K 3.2 (L) 07/05/2018 2108   CL 107 07/05/2018 2108   CO2 23 07/05/2018 2108   GLUCOSE 110 (H) 07/05/2018 2108   BUN 8 07/05/2018 2108   CREATININE 0.73 07/05/2018 2108   CALCIUM 8.8 (L) 07/05/2018 2108   GFRNONAA >60 07/05/2018 2108   GFRAA >60 07/05/2018 2108    BNP No results found for: BNP  ProBNP No results found for: PROBNP    Assessment & Plan:   Asthma Plan: Resume Symbicort 80, sample provided today Refills of Symbicort 80 sent to the pharmacy Stop Qvar use Stop using family member and friends inhalers Can use pro-air every 6  hours as needed for shortness of breath or wheezing, refill sent to pharmacy Can also utilize albuterol nebulizer every 6 hours as needed for shortness of breath or wheezing, refill sent to pharmacy  You must stop your illicit drug use which affects your breathing  Personal history of multiple sclerosis Plan: Referral to neurology for further work-up and management  Renal mass Plan: Due to poor follow-up and communication with primary care will refer to nephrology today to ensure patient has follow-up and work-up regarding CT abdomen results Referral to new primary care provider today  Abnormal findings on diagnostic imaging of lung Plan: CT chest without contrast to be completed in September/2020 to further evaluate pulmonary nodule found on CT abdomen in June/2020  Cigarette smoker Plan: Continue to not smoke cigarettes  Substance abuse ([place]) Plan: Continue to not smoke cigarettes You need to stop drinking You need to stop smoking marijuana You need to stop illicit drugs such as cocaine as well as illicit pharmaceuticals Distance yourself from boyfriend as well as friends who also participate in illicit drug use  Referral to psychiatry Referral to primary care  Compliance poor Plan: Patient with poor compliance to plan of care and follow-up with specialist  Discussed in depth with patient the  importance of her having follow-up with specialist as well as primary care.  Is a partnership for [place] to help support and manage patient's health.  She needs to also take an active role in management of this.  She needs to avoid illicit drug use and complete follow-ups.  She also needs to notify our office if she is having difficulty obtaining inhalers.  She should not use anyone else's inhalers and management with her asthma.  Patient with personal history of MS, will be referred to neurology for future work-up and follow-up  Patient with personal complaints regarding follow-up with  primary care, referral to new primary care doctor today  Patient with renal lesion seen on June/2020 CT abdomen, will refer to nephrology today  Patient with active depression as well as substance abuse, referral to psychiatry today  Cocaine abuse ([place]) Plan: You need to stop using cocaine  Cocaine can affect your overall chronic health as well as your mental health.  We have discussed this at length today.    Return in about 2 months (around 10/22/2018), or if symptoms worsen or fail to improve, for After Chest CT, Follow up with Dr. Halford Chessman.   Lauraine Rinne, NP 08/21/2018   This appointment was 45 minutes long with over 50% of the time in direct face-to-face patient care, assessment, plan of care, and follow-up.

## 2018-08-21 ENCOUNTER — Ambulatory Visit: Payer: Medicaid Other | Admitting: Pulmonary Disease

## 2018-08-21 ENCOUNTER — Other Ambulatory Visit: Payer: Self-pay

## 2018-08-21 ENCOUNTER — Encounter: Payer: Self-pay | Admitting: Pulmonary Disease

## 2018-08-21 VITALS — BP 112/66 | HR 82 | Temp 98.5°F | Ht 62.0 in | Wt 126.0 lb

## 2018-08-21 DIAGNOSIS — Z8669 Personal history of other diseases of the nervous system and sense organs: Secondary | ICD-10-CM

## 2018-08-21 DIAGNOSIS — F341 Dysthymic disorder: Secondary | ICD-10-CM | POA: Insufficient documentation

## 2018-08-21 DIAGNOSIS — F1910 Other psychoactive substance abuse, uncomplicated: Secondary | ICD-10-CM | POA: Insufficient documentation

## 2018-08-21 DIAGNOSIS — G35 Multiple sclerosis: Secondary | ICD-10-CM | POA: Insufficient documentation

## 2018-08-21 DIAGNOSIS — F329 Major depressive disorder, single episode, unspecified: Secondary | ICD-10-CM | POA: Insufficient documentation

## 2018-08-21 DIAGNOSIS — R0602 Shortness of breath: Secondary | ICD-10-CM | POA: Insufficient documentation

## 2018-08-21 DIAGNOSIS — R918 Other nonspecific abnormal finding of lung field: Secondary | ICD-10-CM | POA: Insufficient documentation

## 2018-08-21 DIAGNOSIS — F32A Depression, unspecified: Secondary | ICD-10-CM

## 2018-08-21 DIAGNOSIS — J4520 Mild intermittent asthma, uncomplicated: Secondary | ICD-10-CM | POA: Diagnosis not present

## 2018-08-21 DIAGNOSIS — F17210 Nicotine dependence, cigarettes, uncomplicated: Secondary | ICD-10-CM

## 2018-08-21 DIAGNOSIS — Z9119 Patient's noncompliance with other medical treatment and regimen: Secondary | ICD-10-CM

## 2018-08-21 DIAGNOSIS — N2889 Other specified disorders of kidney and ureter: Secondary | ICD-10-CM | POA: Diagnosis not present

## 2018-08-21 DIAGNOSIS — F1410 Cocaine abuse, uncomplicated: Secondary | ICD-10-CM | POA: Insufficient documentation

## 2018-08-21 DIAGNOSIS — Z91199 Patient's noncompliance with other medical treatment and regimen due to unspecified reason: Secondary | ICD-10-CM

## 2018-08-21 MED ORDER — BUDESONIDE-FORMOTEROL FUMARATE 80-4.5 MCG/ACT IN AERO: 2.0000 | INHALATION_SPRAY | Freq: Two times a day (BID) | RESPIRATORY_TRACT | 3 refills | Status: DC

## 2018-08-21 MED ORDER — BUDESONIDE-FORMOTEROL FUMARATE 80-4.5 MCG/ACT IN AERO: 2.0000 | INHALATION_SPRAY | Freq: Two times a day (BID) | RESPIRATORY_TRACT | 0 refills | Status: DC

## 2018-08-21 MED ORDER — ALBUTEROL SULFATE HFA 108 (90 BASE) MCG/ACT IN AERS: 1.0000 | INHALATION_SPRAY | Freq: Four times a day (QID) | RESPIRATORY_TRACT | 3 refills | Status: DC | PRN

## 2018-08-21 MED ORDER — ALBUTEROL SULFATE (2.5 MG/3ML) 0.083% IN NEBU: 2.5000 mg | INHALATION_SOLUTION | Freq: Four times a day (QID) | RESPIRATORY_TRACT | 3 refills | Status: DC | PRN

## 2018-08-21 NOTE — Assessment & Plan Note (Signed)
Plan: Continue to not smoke cigarettes

## 2018-08-21 NOTE — Patient Instructions (Addendum)
You need to stop doing illcit drugs   Referral to [facility]   Referral to nephrology  Referral to primary care   Referral to psychiatry   Continue Symbicort 80 >>> 2 puffs in the morning right when you wake up, rinse out your mouth after use, 12 hours later 2 puffs, rinse after use >>> Take this daily, no matter what >>> This is not a rescue inhaler   STOP Qvar   Only use your albuterol as a rescue medication to be used if you can't catch your breath by resting or doing a relaxed purse lip breathing pattern.  - The less you use it, the better it will work when you need it. - Ok to use up to 2 puffs  every 4 hours if you must but call for immediate appointment if use goes up over your usual need - Don't leave home without it !!  (think of it like the spare tire for your car)    Pulmonary function test ordered to be completed in 2 to 3 months   CT non contrast ordered to be completed in September/2020   Return in about 2 months (around 10/22/2018), or if symptoms worsen or fail to improve, for After Chest CT, Follow up with Dr. Halford Chessman.    Cannabis Use Disorder Cannabis use disorder is when using marijuana disrupts a person's daily life or causes health problems. This condition can be dangerous. The health problems this condition can cause include:  Long-lasting problems with thinking and learning. These can be permanent in young people.  Severe anxiety.  Paranoia.  Hallucinations.  Dangerously high blood pressure and heart rate.  Schizophrenia.  Breathing problems.  Problems with child development during and after pregnancy. People with this condition are also more likely to use other drugs. What are the causes? This condition is caused by using marijuana too much over time. It is not caused by using it only once in a while. Many people with this condition use marijuana because it gives them a feeling of extreme pleasure or relaxation. What increases the risk? This  condition is more likely to develop in:  Men.  People with a family history of cannabis use disorder.  People with mental health issues such as depression or post-traumatic stress disorder. What are the signs or symptoms? Symptoms of this condition include:  Using greater amounts of marijuana than you want to, or using marijuana for longer than you want to.  Craving marijuana.  Spending a lot of time getting marijuana and using it or recovering from its effects.  Having problems at work, at school, at home, or in relationships because of marijuana use.  Giving up or cutting down on important life activities because of marijuana use.  Using marijuana at times when it is dangerous, such as while you are driving a car.  Needing more and more marijuana to get the same effect you want from the marijuana (building up a tolerance).  Physical problems, such as: ? A long-lasting cough. ? Bronchitis. ? Emphysema. ? Throat and lung cancer.  Mental problems, such as: ? Psychosis. ? Anxiety. ? Trouble sleeping.  Having symptoms of withdrawal when you stop using marijuana. Symptom of withdrawal include: ? Irritability or anger. ? Anxiety or restlessness. ? Trouble sleeping. ? Loss of appetite or weight loss. ? Aches and pains. ? Shakiness, sweating, or chills. How is this diagnosed? This condition is diagnosed with an assessment. During the assessment, your health care provider will ask about your  marijuana use and about how it affects your life. You will be diagnosed with the condition if you have had at least two symptoms of this condition within a 71-month period. How severe the condition is depends on how many symptoms you have.  If you have two to three symptoms, your condition is mild.  If you have four to five symptoms, your condition is moderate.  If you have six or more symptoms, your condition is severe. Your health care provider may perform a physical exam or do lab  tests to see if you have physical problems resulting from marijuana use. Your health care provider may also screen for drug use and refer you to a mental health professional for evaluation. How is this treated? Treatment for this condition is usually provided by mental health professionals with training in substance use disorders. Your treatment may involve:  Counseling. This treatment is also called talk therapy. It is provided by substance use treatment counselors. A counselor can address the reasons you use marijuana and suggest ways to keep you from using it again. The goals of talk therapy are to: ? Find healthy activities to replace using marijuana. ? Identify and avoid the things that trigger your marijuana use. ? Help you learn how to handle cravings.  Support groups. Support groups are led by people who have quit using marijuana. They provide emotional support, advice, and guidance.  Medicine. Medicine is used to treat mental health issues that trigger marijuana use or that result from it. Follow these instructions at home:  Take over-the-counter and prescription medicines only as told by your health care provider.  Check with your health care provider before starting any new medicines.  Keep all follow-up visits as told by your health care provider. This is important.  Work with Higher education careers adviser or group to develop tools to keep you from using marijuana again (relapsing).  Make healthy lifestyle choices, such as: ? Eating a healthy diet. ? Getting enough exercise. ? Improving your stress-management skills.  Learn daily living skills and work Psychologist, occupational. Where to find more information  [employer] on Drug Abuse: motorcyclefax.com  Substance Abuse and Mental Health Services Administration: ktimeonline.com Contact a health care provider if:  You are not able to take your medicines as told.  Your symptoms get worse. Get help right away if:  You have serious thoughts  about hurting yourself or others. If you ever feel like you may hurt yourself or others, or have thoughts about taking your own life, get help right away. You can go to your nearest emergency department or call:  Your local emergency services (911 in the [place]  A suicide crisis helpline, such as the [place] at 769-046-1948. This is open 24 hours a day. This information is not intended to replace advice given to you by your health care provider. Make sure you discuss any questions you have with your health care provider. Document Released: 01/15/2000 Document Revised: 12/30/2016 Document Reviewed: 10/16/2015 Elsevier Patient Education  2020 [employer].     What You Need to Know About Marijuana Use Marijuana is a mixture of the dried leaves and flowers of the hemp plant Cannabis sativa. The plant's active ingredients (cannabinoids) change the chemistry of the brain. If you smoke or eat marijuana, you will experience changes in the way you think, feel, and behave. Many people use marijuana because it helps them relax and puts them in a pleasurable mood (marijuana high). Some people use marijuana for medical  effects, such as:  Reduced nausea.  Increased appetite.  Reduced muscle spasm.  Pain relief. Researchers are studying other possible medical uses for marijuana. How can marijuana use affect me? Many people find a marijuana high to be pleasurable and relaxing. Other people find a marijuana high to be uncomfortable or anxiety-causing. This drug can cause short-term and long-term physical and mental effects. Taking high doses of marijuana or trying to quit marijuana can also affect you. Short-term effects of marijuana use include:  Temporary relief of symptoms from a medical condition.  Changes in mood and perception (feeling high).  Increased hunger.  Increased heart rate.  Slowed movement and reaction time.  Poor memory, judgment, and problem  solving ability.  Altered sense of time.  Changes to vision.  Bloodshot eyes.  Coughing. Long-term effects of marijuana use include:  Higher risk of lung and breathing problems.  Higher risk of heart attack.  Higher risk of testicular cancer.  Mental and physical dependence (addiction).  Slowed brain development in young people. Babies whose mothers used marijuana during pregnancy have an increased risk of problems with brain development and behavior.  Temporary periods of false perceptions or beliefs (hallucinations or paranoia).  Worsening of mental illness.  Onset of new mental illness such as anxiety, depression, or suicidal thoughts.  Withdrawal symptoms when stopping marijuana, such as sleeplessness, anxiety, cravings, and anger.  Difficulty maintaining healthy relationships.  Poor memory, and difficulty concentrating and learning. This can result in decreased intelligence and poor performance at school or work, and an increased risk of dropping out of school.  Higher risk of using other substances like alcohol and nicotine. High doses of marijuana can cause:  Panic.  Anxiety.  Mental confusion.  Hallucinations. Quitting marijuana after using it for a long time can cause withdrawal symptoms, such as:  Headache.  Shakiness.  Sweating.  Stomach pain.  Nausea.  Restlessness.  Irritability.  Trouble sleeping.  Decreased appetite. What are the benefits of not using marijuana? Not using marijuana can keep you from becoming dependent on it. You can avoid the negative effects of the drug that can reduce your quality of life. You can avoid accidents caused by the slowed reaction time that is common with marijuana use. If I already use marijuana, what steps can I take to stop using it? If you are not physically or mentally dependent on marijuana, you should be able to stop using it on your own. If you cannot stop on your own, ask your health care provider  for help. Treatment for marijuana addiction is similar to treatment for other addictions. It may include:  Cognitive-behavioral therapy (psychotherapy). This may include individual or group therapy.  Joining a support group.  Treating medical, behavioral, or mental health conditions that exist along with marijuana dependency.  Where can I get more information? Learn more about:  Marijuana from the [place] on Drug Abuse: MissingBag.si  Medical marijuana from the [place]: LocatorExpress.is  Treatment options from the Substance Abuse and [place]: https://findtreatment.EcoRefrigerator.com.au  Recovery from marijuana dependency from Recovery.org: http://www.recovery.org/topics/marijuana-recovery When should I seek medical care? Talk with your health care provider if:  You want to stop using marijuana but you cannot.  You have withdrawal symptoms when you try to stop using marijuana.  You are using marijuana every day.  You are using marijuana along with other drugs like cocaine or alcohol.  You have anxiety or depression.  You have hallucinations or paranoia.  Marijuana use  is interfering with your relationships or your ability to function normally at school or at work. Summary  You may become physically or mentally dependent on marijuana.  Long-term use may interfere with your ability to function normally at home, school, or work.  Marijuana addiction is treatable. This information is not intended to replace advice given to you by your health care provider. Make sure you discuss any questions you have with your health care provider. Document Released: 01/09/2015 Document Revised: 12/30/2016 Document Reviewed: 10/07/2015 Elsevier Patient Education  2020 [place] Drug Use Information, Adult Illegal drugs are chemicals and substances  that are illegal to use, sell, or have (possess). Health care providers and pharmacies do not use or carry these types of drugs to treat medical problems because they can cause serious side effects and can lead to death. Examples of illegal drugs include:  Cocaine or crack.  Meth (methamphetamine) or crystal meth.  "Bath salts" (synthetic cathinones).  Heroin.  LSD, PCP, or acid.  Ecstasy. What is drug dependence? Using illegal drugs often leads to dependence or addiction. When you use certain drugs over a long period of time, your brain chemistry changes so that you can no longer function normally without that drug. This is called drug dependence. Drug dependence can cause you to:  Have unpleasant feelings and physical problems when you stop using the drug (withdrawal).  Be unable to perform at work or at home, or do the activities you used to do, without using the drug. Some drugs make people feel so good that they want to use the drug again and again. This is called drug addiction. People who are addicted spend a lot of time seeking out the drug so that they can get the feeling they want from it. Addiction and dependence can be very hard to overcome. How can illegal drug use and dependence affect me? Using an illegal drug only once can have a major impact on your life. It is possible to die from side effects after using a drug just once. If you use an illegal drug repeatedly, you may need to take larger and larger doses of the drug to experience the feelings you want. Drug dependency and addiction may lead to:  Being unable to care for yourself and others.  Withdrawal, if you stop using the drug.  Negative effects on your relationships and work performance. It causes others not to trust you. If you have children, you could lose custody of them.  Behaving in ways that do not match your values.  Lying and crime, such as stealing.  Jail or prison. This can affect your ability to find  a good job or continue your education.  Health problems such as tooth loss, skin problems, heart and lung disease, and stomach problems.  If you are a woman, you may have problems with pregnancy, including: ? Losing the pregnancy early (miscarriage), early delivery (premature birth), or delivering a lifeless infant (stillbirth). ? Slow or abnormal growth (birth defects) of your unborn child. ? Giving birth to a newborn who is addicted to illegal drugs.  A drug overdose. This is a dangerous situation that requires hospitalization and often leads to death. What are the benefits of avoiding illegal drug use? Avoiding illegal drug use can:  Keep your mind and body healthy. This can help improve work performance and participation in activities.  Keep you from developing drug dependency or addiction. This can help you avoid negative side effects  such as withdrawal and overdose.  Help you have healthy relationships with your friends and family.  Help you have more stable finances. Instead of using your money for drugs, you can: ? Spend it on things you would like to have. ? Save it for future use, such as for retirement or for big purchases like a car or a house.  Help you avoid a permanent criminal record, jail time, or prison time. Having a clean record allows you to have more job and educational opportunities in the future. What actions can be taken? To avoid using illegal drugs:  Find healthy ways to cope with stress, such as exercise, meditation, or spending time with family and friends. Talk with your health care provider about how you feel and how to cope with stress.  Spend time with people who do not use illegal drugs, or make new friends who do not use drugs.  Do something else instead of using drugs. You can exercise, take up a hobby, or participate in activities that you can do with others.  Do not be afraid to say no if someone offers you an illegal drug. Speak up about why you  do not want to use drugs. You can be a positive role model for others.  Work with a health care provider or counselor to create a program for yourself to help you deal with various aspects of your addiction. Where to find more information You can find more information about illegal drug use, dependence, and addiction from:  Your health care provider or mental health counselor.  Narcotics Anonymous: www.na.org  Substance Abuse and Mental Health Services Administration: ? Treatment finder: kokohomes.com ? National helpline: 1-800-662-HELP 7437648830) Contact a health care provider if:  You use illegal drugs.  You have missed family activities or work to use illegal drugs.  You have lied or stolen to get illegal drugs.  You lose interest in things you used to enjoy, like hobbies or family activities.  Your eating or sleeping habits change as a result of drug use.  You use medicine to get the same effects as a drug. This is illegal use and can become addictive as well.  You stopped illegal drug use previously and you start actively using it again (relapse).  You want help to change your addictive behavior. Get help right away if:  You have thoughts about hurting yourself or others. If you ever feel like you may hurt yourself or others, or have thoughts about taking your own life, get help right away. You can go to your nearest emergency department or call:  Your local emergency services (911 in the [place]  A suicide crisis helpline, such as the [place] at (331)759-8222. This is open 24 hours a day. Summary  Illegal drugs are chemicals and substances that are illegal to use, sell, or possess.  Using illegal drugs often leads to dependence or addiction. This means that you need the drug to feel normal.  If you use illegal drugs, look for resources to help you quit and find healthy ways to cope with stress. This information is not  intended to replace advice given to you by your health care provider. Make sure you discuss any questions you have with your health care provider. Document Released: 07/13/2016 Document Revised: 07/13/2016 Document Reviewed: 07/13/2016 Elsevier Patient Education  2020 [employer].

## 2018-08-21 NOTE — Assessment & Plan Note (Signed)
Plan: Referral to neurology for further work-up and management

## 2018-08-21 NOTE — Assessment & Plan Note (Addendum)
Plan: Due to poor follow-up and communication with primary care will refer to nephrology today to ensure patient has follow-up and work-up regarding CT abdomen results Referral to new primary care provider today

## 2018-08-21 NOTE — Assessment & Plan Note (Signed)
Plan: You need to stop using cocaine  Cocaine can affect your overall chronic health as well as your mental health.  We have discussed this at length today.

## 2018-08-21 NOTE — Assessment & Plan Note (Signed)
Plan: Patient with poor compliance to plan of care and follow-up with specialist  Discussed in depth with patient the importance of her having follow-up with specialist as well as primary care.  Is a partnership for [place] to help support and manage patient's health.  She needs to also take an active role in management of this.  She needs to avoid illicit drug use and complete follow-ups.  She also needs to notify our office if she is having difficulty obtaining inhalers.  She should not use anyone else's inhalers and management with her asthma.  Patient with personal history of MS, will be referred to neurology for future work-up and follow-up  Patient with personal complaints regarding follow-up with primary care, referral to new primary care doctor today  Patient with renal lesion seen on June/2020 CT abdomen, will refer to nephrology today  Patient with active depression as well as substance abuse, referral to psychiatry today

## 2018-08-21 NOTE — Assessment & Plan Note (Signed)
Plan: Continue to not smoke cigarettes You need to stop drinking You need to stop smoking marijuana You need to stop illicit drugs such as cocaine as well as illicit pharmaceuticals Distance yourself from boyfriend as well as friends who also participate in illicit drug use  Referral to psychiatry Referral to primary care

## 2018-08-21 NOTE — Assessment & Plan Note (Signed)
Plan: Resume Symbicort 80, sample provided today Refills of Symbicort 80 sent to the pharmacy Stop Qvar use Stop using family member and friends inhalers Can use pro-air every 6 hours as needed for shortness of breath or wheezing, refill sent to pharmacy Can also utilize albuterol nebulizer every 6 hours as needed for shortness of breath or wheezing, refill sent to pharmacy  You must stop your illicit drug use which affects your breathing

## 2018-08-21 NOTE — Assessment & Plan Note (Signed)
Plan: CT chest without contrast to be completed in September/2020 to further evaluate pulmonary nodule found on CT abdomen in June/2020

## 2018-08-22 NOTE — Addendum Note (Signed)
Addended by: Lauraine Rinne on: 08/22/2018 08:47 AM   Modules accepted: Orders

## 2018-08-24 ENCOUNTER — Encounter: Payer: Self-pay | Admitting: Podiatry

## 2018-08-24 ENCOUNTER — Ambulatory Visit: Payer: Medicaid Other | Admitting: Podiatry

## 2018-08-24 ENCOUNTER — Other Ambulatory Visit: Payer: Self-pay

## 2018-08-24 VITALS — Temp 97.8°F

## 2018-08-24 DIAGNOSIS — B070 Plantar wart: Secondary | ICD-10-CM

## 2018-08-24 DIAGNOSIS — M79672 Pain in left foot: Secondary | ICD-10-CM

## 2018-08-24 DIAGNOSIS — D492 Neoplasm of unspecified behavior of bone, soft tissue, and skin: Secondary | ICD-10-CM

## 2018-08-28 ENCOUNTER — Encounter: Payer: Self-pay | Admitting: Neurology

## 2018-08-29 NOTE — Progress Notes (Signed)
Subjective:   [name], female   DOB: 43 y.o.   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  We will continue with conservative treatment of this callus and patient will be seen back on routine basis or as needed

## 2018-09-22 NOTE — Progress Notes (Signed)
Reviewed and agree with assessment/plan.   Sherran Margolis, MD [facility] 01/27/2016, 12:24 PM Pager:  [phone]  

## 2018-09-28 ENCOUNTER — Encounter (HOSPITAL_COMMUNITY): Payer: Self-pay | Admitting: Emergency Medicine

## 2018-09-28 ENCOUNTER — Emergency Department (HOSPITAL_COMMUNITY)
Admission: EM | Admit: 2018-09-28 | Discharge: 2018-09-28 | Disposition: A | Discharge status: Home / Self Care | Payer: Medicaid Other | Attending: Emergency Medicine | Admitting: Emergency Medicine

## 2018-09-28 ENCOUNTER — Other Ambulatory Visit: Payer: Self-pay

## 2018-09-28 DIAGNOSIS — R1010 Upper abdominal pain, unspecified: Secondary | ICD-10-CM | POA: Diagnosis present

## 2018-09-28 DIAGNOSIS — Z87891 Personal history of nicotine dependence: Secondary | ICD-10-CM | POA: Diagnosis not present

## 2018-09-28 DIAGNOSIS — Z79899 Other long term (current) drug therapy: Secondary | ICD-10-CM | POA: Diagnosis not present

## 2018-09-28 DIAGNOSIS — J45909 Unspecified asthma, uncomplicated: Secondary | ICD-10-CM | POA: Diagnosis not present

## 2018-09-28 LAB — COMPREHENSIVE METABOLIC PANEL
ALT: 22 U/L (ref 0–44)
AST: 23 U/L (ref 15–41)
Albumin: 3.7 g/dL (ref 3.5–5.0)
Alkaline Phosphatase: 63 U/L (ref 38–126)
Anion gap: 10 (ref 5–15)
BUN: 12 mg/dL (ref 6–20)
CO2: 21 mmol/L — ABNORMAL LOW (ref 22–32)
Calcium: 9.4 mg/dL (ref 8.9–10.3)
Chloride: 106 mmol/L (ref 98–111)
Creatinine, Ser: 0.77 mg/dL (ref 0.44–1.00)
GFR calc Af Amer: >60 mL/min (ref >60)
GFR calc non Af Amer: >60 mL/min (ref >60)
Glucose, Bld: 98 mg/dL (ref 70–99)
Potassium: 3.6 mmol/L (ref 3.5–5.1)
Sodium: 137 mmol/L (ref 135–145)
Total Bilirubin: 0.3 mg/dL (ref 0.3–1.2)
Total Protein: 6.2 g/dL — ABNORMAL LOW (ref 6.5–8.1)

## 2018-09-28 LAB — CBC
HCT: 44.3 % (ref 36.0–46.0)
Hemoglobin: 14.8 g/dL (ref 12.0–15.0)
MCH: 29.4 pg (ref 26.0–34.0)
MCHC: 33.4 g/dL (ref 30.0–36.0)
MCV: 88.1 fL (ref 80.0–100.0)
Platelets: 262 10*3/uL (ref 150–400)
RBC: 5.03 MIL/uL (ref 3.87–5.11)
RDW: 13.9 % (ref 11.5–15.5)
WBC: 7.9 10*3/uL (ref 4.0–10.5)
nRBC: 0 % (ref 0.0–0.2)

## 2018-09-28 LAB — URINALYSIS, ROUTINE W REFLEX MICROSCOPIC
Bilirubin Urine: NEGATIVE
Glucose, UA: NEGATIVE mg/dL
Ketones, ur: NEGATIVE mg/dL
Nitrite: NEGATIVE
Protein, ur: 30 mg/dL — AB
RBC / HPF: >50 RBC/hpf — ABNORMAL HIGH (ref 0–5)
Specific Gravity, Urine: 1.023 (ref 1.005–1.030)
pH: 6 (ref 5.0–8.0)

## 2018-09-28 LAB — LIPASE, BLOOD: Lipase: 32 U/L (ref 11–51)

## 2018-09-28 LAB — I-STAT BETA HCG BLOOD, ED (MC, WL, AP ONLY): I-stat hCG, quantitative: <5 m[IU]/mL (ref <5)

## 2018-09-28 MED ORDER — OMEPRAZOLE 20 MG PO CPDR: 20.0000 mg | DELAYED_RELEASE_CAPSULE | Freq: Every day | ORAL | 0 refills | Status: DC

## 2018-09-28 MED ORDER — SODIUM CHLORIDE 0.9% FLUSH: 3.0000 mL | Freq: Once | INTRAVENOUS | Status: DC

## 2018-09-28 MED ORDER — POLYETHYLENE GLYCOL 3350 17 G PO PACK: 17.0000 g | PACK | Freq: Every day | ORAL | 0 refills | Status: DC | PRN

## 2018-09-28 NOTE — ED Provider Notes (Signed)
[facility] EMERGENCY DEPARTMENT Provider Note   CSN: [id number] Arrival date & time: 09/28/18  0236     History   Chief Complaint Chief Complaint  Patient presents with  . Abdominal Cramping    Catherine Osborne is a 43 y.o. female.     Catherine Patient presents with abdominal pain.  Has had over the last 3 months more frequently but appears of abdominal pain previous to this.  States it is in her upper abdomen.  Some constipation.  Nausea.  Has not vomited.  States episodes come on and she feels crampy.  Has had previous CAT scan without clear cause.  States she has has MS that was discovered on an abdominal CT scan.  States she has a liver lesion and a hole in her lung.  States she is due to get a CT scan to evaluate the lungs.  No fevers.  Pain is dull.  Not associated with eating.  States she feels sometimes feels like this if she is pregnant.  States all her pregnancy test have been negative. Past Medical History:  Diagnosis Date  . Allergy   . Asthma   . Headache   . 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 Pneumonia   . Prior miscarriage with pregnancy in first trimester, antepartum   . Vaginal Pap smear, abnormal     Patient Active Problem List   Diagnosis Date Noted  . Personal history of multiple sclerosis 08/21/2018  . Depression 08/21/2018  . Shortness of breath 08/21/2018  . 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 Abnormal findings on diagnostic imaging of lung 08/21/2018  . Substance abuse ([place]) 08/21/2018  . Cocaine abuse ([place]) 08/21/2018  . Bronchitis, acute 04/03/2018  . Seasonal allergies 01/18/2016  . Cigarette smoker 01/18/2016  . Compliance poor 01/18/2016  . Asthma 12/07/2015  . Goiter diffuse 12/07/2015  . NSVD (normal spontaneous vaginal delivery) 06/13/2015  . Pregnancy induced hypertension 06/12/2015  . Preeclampsia 06/10/2015  . Short cervix affecting pregnancy 02/25/2015  . Advanced maternal age in multigravida 02/12/2015  . Left thyroid  nodule 10/25/2011    Past Surgical History:  Procedure Laterality Date  . CERVICAL CERCLAGE N/A 02/26/2015   Procedure: CERCLAGE CERVICAL;  Surgeon: Frederico Hamman, MD;  Location: [facility];  Service: Gynecology;  Laterality: N/A;  . DILATION AND CURETTAGE OF UTERUS    . DILATION AND EVACUATION N/A 05/22/2012   Procedure: DILATATION AND EVACUATION;  Surgeon: Frederico Hamman, MD;  Location: [facility];  Service: Gynecology;  Laterality: N/A;  . FOOT SURGERY       OB History    Gravida  14   Para  2   Term  1   Preterm  1   AB  12   Living  1     SAB  10   TAB  2   Ectopic      Multiple  0   Live Births  1            Home Medications    Prior to Admission medications   Medication Sig Start Date End Date Taking? Authorizing Provider  albuterol (PROVENTIL) (2.5 MG/3ML) 0.083% nebulizer solution Take 3 mLs (2.5 mg total) by nebulization every 6 (six) hours as needed for wheezing or shortness of breath. 08/21/18   Lauraine Rinne, NP  albuterol (VENTOLIN HFA) 108 (90 Base) MCG/ACT inhaler Inhale 1-2 puffs into the lungs every 6 (six) hours as needed for wheezing or shortness of breath.  08/21/18   Lauraine Rinne, NP  benzonatate (TESSALON) 100 MG capsule Take 1 capsule (100 mg total) by mouth every 8 (eight) hours. 04/02/18   Zigmund Gottron, NP  budesonide-formoterol (SYMBICORT) 80-4.5 MCG/ACT inhaler Inhale 2 puffs into the lungs 2 (two) times a day. 05/31/18   Martyn Ehrich, NP  budesonide-formoterol [facility]) 80-4.5 MCG/ACT inhaler Inhale 2 puffs into the lungs 2 (two) times a day. 08/21/18   Lauraine Rinne, NP  budesonide-formoterol (SYMBICORT) 80-4.5 MCG/ACT inhaler Inhale 2 puffs into the lungs 2 (two) times a day. 08/21/18   Lauraine Rinne, NP  omeprazole (PRILOSEC) 20 MG capsule Take 1 capsule (20 mg total) by mouth daily for 14 days. 09/28/18 10/12/18  Davonna Belling, MD  ondansetron (ZOFRAN) 4 MG tablet Take 1 tablet (4 mg total) by mouth every 8 (eight) hours as  needed for nausea or vomiting. 07/05/18   Lonzo Candy, MD  polyethylene glycol (MIRALAX / GLYCOLAX) 17 g packet Take 17 g by mouth daily as needed for moderate constipation. 09/28/18   Davonna Belling, MD  varenicline (CHANTIX PAK) 0.5 MG X 11 & 1 MG X 42 tablet Take one 0.5 mg tablet by mouth once daily for 3 days, then increase to one 0.5 mg tablet twice daily for 4 days, then increase to one 1 mg tablet twice daily. 02/07/18   Martyn Ehrich, NP    Family History Family History  Adopted: Yes  Problem Relation Age of Onset  . Bone cancer Mother   . Breast cancer Maternal Grandmother   . Diabetes Other   . Hypertension Other   . Stroke Other   . Heart attack Other     Social History Social History   Tobacco Use  . Smoking status: Former Smoker    Packs/day: 0.75    Years: 20.00    Pack years: 15.00    Types: Cigarettes    Quit date: 01/31/2018    Years since quitting: 0.6  . Smokeless tobacco: Never Used  . Tobacco comment: marijuana/30 mins ago  Substance Use Topics  . Alcohol use: Yes    Alcohol/week: 1.0 standard drinks    Types: 1 Glasses of wine per week    Comment: rare  . Drug use: Yes    Frequency: 210.0 times per week    Types: Marijuana    Comment: 30 blunts a day      Allergies   Patient has no known allergies.   Review of Systems Review of Systems  Constitutional: Positive for appetite change.  HENT: Negative for congestion.   Respiratory: Negative for shortness of breath.   Cardiovascular: Negative for chest pain.  Gastrointestinal: Positive for abdominal pain, constipation, nausea and vomiting.  Genitourinary: Negative for flank pain.  Musculoskeletal: Negative for back pain.  Skin: Negative for wound.  Neurological: Negative for weakness.  Hematological: Does not bruise/bleed easily.     Physical Exam Updated Vital Signs BP 136/80 (BP Location: Right Arm)   Pulse 76   Temp 97.9 F (36.6 C) (Oral)   Resp 18   LMP 09/01/2018   SpO2  98%   Physical Exam Vitals signs and nursing note reviewed.  HENT:     Head: Normocephalic.  Eyes:     Pupils: Pupils are equal, round, and reactive to light.  Neck:     Musculoskeletal: Neck supple.  Cardiovascular:     Rate and Rhythm: Normal rate and regular rhythm.  Abdominal:     Tenderness: There is  no abdominal tenderness.  Musculoskeletal:     Right lower leg: No edema.     Left lower leg: No edema.  Skin:    Coloration: Skin is not jaundiced.  Neurological:     General: No focal deficit present.     Mental Status: She is alert and oriented to person, place, and time.      ED Treatments / Results  Labs (all labs ordered are listed, but only abnormal results are displayed) Labs Reviewed  COMPREHENSIVE METABOLIC PANEL - Abnormal; Notable for the following components:      Result Value   CO2 21 (*)    Total Protein 6.2 (*)    All other components within normal limits  URINALYSIS, ROUTINE W REFLEX MICROSCOPIC - Abnormal; Notable for the following components:   APPearance CLOUDY (*)    Hgb urine dipstick MODERATE (*)    Protein, ur 30 (*)    Leukocytes,Ua TRACE (*)    RBC / HPF >50 (*)    Bacteria, UA RARE (*)    All other components within normal limits  URINE CULTURE  LIPASE, BLOOD  CBC  I-STAT BETA HCG BLOOD, ED ([facility], [facility], [facility] ONLY)    EKG None  Radiology No results found.  Procedures Procedures (including critical care time)  Medications Ordered in ED Medications  sodium chloride flush (NS) 0.9 % injection 3 mL (has no administration in time range)     Initial Impression / Assessment and Plan / ED Course  I have reviewed the triage vital signs and the nursing notes.  Pertinent labs & imaging results that were available during my care of the patient were reviewed by me and considered in my medical decision making (see chart for details).       Patient abdominal pain over the last 3 months.  CT scan done previously and showed an enteritis.   Benign exam.  Lab work reassuring but urine nonspecific findings.  Will send culture but she has had no urinary symptoms so will not empirically treat.  Patient may benefit from gastroenterology follow-up as this has been more of a consistent problem.  Reviewed CT scan from prior visit and has a renal lesion not a liver lesion.  I do not see an MRI so I am not sure about her MS diagnosis.  States it was made at the abdomen blunts clinic because she has had symptoms for a while.  Final Clinical Impressions(s) / ED Diagnoses   Final diagnoses:  Pain of upper abdomen    ED Discharge Orders         Ordered    omeprazole (PRILOSEC) 20 MG capsule  Daily     09/28/18 0733    polyethylene glycol (MIRALAX / GLYCOLAX) 17 g packet  Daily PRN     09/28/18 0733           Davonna Belling, MD 09/28/18 [phone]

## 2018-09-28 NOTE — ED Triage Notes (Addendum)
Pt arrives from home via ptar for c/o lower left sided sharp abd pain/cramping that awoke her from sleep. Pt did not take anything for pain prior to arrival. She denies any associated symptoms. Reports recent diagnosis of MS

## 2018-09-28 NOTE — ED Notes (Signed)
C/o abd. Pain x 3 months, states nothing different, just still hurts , states she was seen before for same . Has history of constipation

## 2018-09-29 LAB — URINE CULTURE: Culture: <10000 — AB

## 2018-10-04 NOTE — Progress Notes (Signed)
NEUROLOGY CONSULTATION NOTE  Catherine Osborne MRN: [id number] DOB: [birth year]  Referring provider: Wyn Quaker, NP Primary care provider: [facility]  Reason for consult:  Personal history of multiple sclerosis  HISTORY OF PRESENT ILLNESS: Catherine Osborne is a 42 year old black female with asthma who presents for history of multiple sclerosis.  History supplemented by ED and referring provider note.  She has longstanding history of depression and chronic pain, such as myalgias and abdominal pain.  It sounds like she has been evaluated in the past by neurology (she reports that she had NCV-EMG several years ago). She was seen in the ED in June for increased abdominal pain.  CT of abdomen and pelvis showed 13 mm cyst on the left kidney and 6 mm nodule in the left lung.  She was diagnosed with gastritis.  She said she was told that she had MS due to findings on the abdominal CT scan.  Family history not known as she was adopted.  Recent labs: 09/28/18:  CBC with WBC 7.9, HGB 14.8, HCT 44.3, PLT 262; CMP with Na 137, K 3.6, Cl 106, CO2 21, glucose 98, BUN 12, Cr 0.77, t bili 0.3, ALP 63, AST 23, ALT 22. 07/05/18:  CBC with WBC 7.5, HGB 14.2, HCT 42.9, PLT 269, ALC 2.8  PAST MEDICAL HISTORY: Past Medical History:  Diagnosis Date  . Allergy   . Asthma   . Headache   . 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 Pneumonia   . Prior miscarriage with pregnancy in first trimester, antepartum   . Vaginal Pap smear, abnormal     PAST SURGICAL HISTORY: Past Surgical History:  Procedure Laterality Date  . CERVICAL CERCLAGE N/A 02/26/2015   Procedure: CERCLAGE CERVICAL;  Surgeon: Frederico Hamman, MD;  Location: [facility];  Service: Gynecology;  Laterality: N/A;  . DILATION AND CURETTAGE OF UTERUS    . DILATION AND EVACUATION N/A 05/22/2012   Procedure: DILATATION AND EVACUATION;  Surgeon: Frederico Hamman, MD;  Location: [facility];  Service: Gynecology;  Laterality: N/A;  . FOOT SURGERY       MEDICATIONS: Current Outpatient Medications on File Prior to Visit  Medication Sig Dispense Refill  . albuterol (PROVENTIL) (2.5 MG/3ML) 0.083% nebulizer solution Take 3 mLs (2.5 mg total) by nebulization every 6 (six) hours as needed for wheezing or shortness of breath. 150 mL 3  . albuterol (VENTOLIN HFA) 108 (90 Base) MCG/ACT inhaler Inhale 1-2 puffs into the lungs every 6 (six) hours as needed for wheezing or shortness of breath. 1 g 3  . benzonatate (TESSALON) 100 MG capsule Take 1 capsule (100 mg total) by mouth every 8 (eight) hours. 21 capsule 0  . budesonide-formoterol (SYMBICORT) 80-4.5 MCG/ACT inhaler Inhale 2 puffs into the lungs 2 (two) times a day. 1 Inhaler 12  . budesonide-formoterol (SYMBICORT) 80-4.5 MCG/ACT inhaler Inhale 2 puffs into the lungs 2 (two) times a day. 1 Inhaler 0  . budesonide-formoterol (SYMBICORT) 80-4.5 MCG/ACT inhaler Inhale 2 puffs into the lungs 2 (two) times a day. 1 Inhaler 3  . omeprazole (PRILOSEC) 20 MG capsule Take 1 capsule (20 mg total) by mouth daily for 14 days. 14 capsule 0  . ondansetron (ZOFRAN) 4 MG tablet Take 1 tablet (4 mg total) by mouth every 8 (eight) hours as needed for nausea or vomiting. 21 tablet 0  . polyethylene glycol (MIRALAX / GLYCOLAX) 17 g packet Take 17 g by mouth daily as needed for moderate constipation. [place]  each 0  . varenicline (CHANTIX PAK) 0.5 MG X 11 & 1 MG X 42 tablet Take one 0.5 mg tablet by mouth once daily for 3 days, then increase to one 0.5 mg tablet twice daily for 4 days, then increase to one 1 mg tablet twice daily. 53 tablet 0   No current facility-administered medications on file prior to visit.     ALLERGIES: No Known Allergies  FAMILY HISTORY: Family History  Adopted: Yes  Problem Relation Age of Onset  . Bone cancer Mother   . Breast cancer Maternal Grandmother   . Diabetes Other   . Hypertension Other   . Stroke Other   . Heart attack Other     SOCIAL HISTORY: Social History    Socioeconomic History  . Marital status: Single    Spouse name: Not on file  . Number of children: Not on file  . Years of education: Not on file  . Highest education level: Not on file  Occupational History  . Not on file  Social Needs  . Financial resource strain: Not on file  . Food insecurity    Worry: Not on file    Inability: Not on file  . Transportation needs    Medical: Not on file    Non-medical: Not on file  Tobacco Use  . Smoking status: Former Smoker    Packs/day: 0.75    Years: 20.00    Pack years: 15.00    Types: Cigarettes    Quit date: 01/31/2018    Years since quitting: 0.6  . Smokeless tobacco: Never Used  . Tobacco comment: marijuana/30 mins ago  Substance and Sexual Activity  . Alcohol use: Yes    Alcohol/week: 1.0 standard drinks    Types: 1 Glasses of wine per week    Comment: rare  . Drug use: Yes    Frequency: 210.0 times per week    Types: Marijuana    Comment: 30 blunts a day   . Sexual activity: Yes    Partners: Male    Birth control/protection: None  Lifestyle  . Physical activity    Days per week: Not on file    Minutes per session: Not on file  . Stress: Not on file  Relationships  . Social Herbalist on phone: Not on file    Gets together: Not on file    Attends religious service: Not on file    Active member of club or organization: Not on file    Attends meetings of clubs or organizations: Not on file    Relationship status: Not on file  . Intimate partner violence    Fear of current or ex partner: Not on file    Emotionally abused: Not on file    Physically abused: Not on file    Forced sexual activity: Not on file  Other Topics Concern  . Not on file  Social History Narrative  . Not on file    REVIEW OF SYSTEMS: Constitutional: No fevers, chills, or sweats, no generalized fatigue, change in appetite Eyes: No visual changes, double vision, eye pain Ear, nose and throat: No hearing loss, ear pain, nasal  congestion, sore throat Cardiovascular: No chest pain, palpitations Respiratory:  No shortness of breath at rest or with exertion, wheezes GastrointestinaI: No nausea, vomiting, diarrhea, abdominal pain, fecal incontinence Genitourinary:  No dysuria, urinary retention or frequency Musculoskeletal:  No neck pain, back pain Integumentary: No rash, pruritus, skin lesions Neurological: as above Psychiatric:  No depression, insomnia, anxiety Endocrine: No palpitations, fatigue, diaphoresis, mood swings, change in appetite, change in weight, increased thirst Hematologic/Lymphatic:  No purpura, petechiae. Allergic/Immunologic: no itchy/runny eyes, nasal congestion, recent allergic reactions, rashes  PHYSICAL EXAM: Blood pressure 126/87, pulse 87, temperature 98 F (36.7 C), height 5\' 2"  (1.575 m), weight 134 lb (60.8 kg), SpO2 99 %.  General: No acute distress.  Patient appears well-groomed.   Head:  Normocephalic/atraumatic Eyes:  fundi examined but not visualized Neck: supple, no paraspinal tenderness, full range of motion Back: No paraspinal tenderness Heart: regular rate and rhythm Lungs: Clear to auscultation bilaterally. Vascular: No carotid bruits. Neurological Exam: Mental status: alert and oriented to person, place, and time, recent and remote memory intact, fund of knowledge intact, attention and concentration intact, speech fluent and not dysarthric, language intact. Cranial nerves: CN I: not tested CN II: pupils equal, round and reactive to light, visual fields intact CN III, IV, VI:  full range of motion, no nystagmus, no ptosis CN V: facial sensation intact CN VII: upper and lower face symmetric CN VIII: hearing intact CN IX, X: gag intact, uvula midline CN XI: sternocleidomastoid and trapezius muscles intact CN XII: tongue midline Bulk & Tone: normal, no fasciculations. Motor:  5/5 throughout  Sensation:  Pinprick and vibration sensation intact.  Deep Tendon Reflexes:   2+ throughout, toes downgoing.   Finger to nose testing:  Without dysmetria.   Heel to shin:  Without dysmetria.   Gait:  Normal station and stride.  Able to turn and tandem walk. Romberg negative.  IMPRESSION: She has longstanding history of depression and chronic pain.  There is nothing based on her history and neurologic exam to suggest that she has multiple sclerosis or any other neurologic disorder.  It appears that she has had a prior workup for her pain (such as NCV-EMG).  She has not had specific testing performed to evaluate MS.  I do not think this is indicated.  No further neurologic testing warranted.  Thank you for allowing me to take part in the care of this patient.  Metta Clines, DO  CC: Wyn Quaker, NP

## 2018-10-05 ENCOUNTER — Encounter: Payer: Self-pay | Admitting: Podiatry

## 2018-10-05 ENCOUNTER — Ambulatory Visit (INDEPENDENT_AMBULATORY_CARE_PROVIDER_SITE_OTHER)
Admission: RE | Admit: 2018-10-05 | Discharge: 2018-10-05 | Disposition: A | Discharge status: Home / Self Care | Payer: Medicaid Other | Source: Ambulatory Visit | Attending: Pulmonary Disease | Admitting: Pulmonary Disease

## 2018-10-05 ENCOUNTER — Other Ambulatory Visit: Payer: Self-pay

## 2018-10-05 ENCOUNTER — Ambulatory Visit: Payer: Medicaid Other | Admitting: Podiatry

## 2018-10-05 ENCOUNTER — Telehealth: Payer: Self-pay

## 2018-10-05 ENCOUNTER — Encounter: Payer: Self-pay | Admitting: Neurology

## 2018-10-05 ENCOUNTER — Telehealth: Payer: Self-pay | Admitting: Pulmonary Disease

## 2018-10-05 ENCOUNTER — Ambulatory Visit (INDEPENDENT_AMBULATORY_CARE_PROVIDER_SITE_OTHER): Payer: Medicaid Other | Admitting: Neurology

## 2018-10-05 VITALS — BP 126/87 | HR 87 | Temp 98.0°F | Ht 62.0 in | Wt 134.0 lb

## 2018-10-05 DIAGNOSIS — L851 Acquired keratosis [keratoderma] palmaris et plantaris: Secondary | ICD-10-CM

## 2018-10-05 DIAGNOSIS — M79672 Pain in left foot: Secondary | ICD-10-CM

## 2018-10-05 DIAGNOSIS — G894 Chronic pain syndrome: Secondary | ICD-10-CM | POA: Diagnosis not present

## 2018-10-05 DIAGNOSIS — F32A Depression, unspecified: Secondary | ICD-10-CM

## 2018-10-05 DIAGNOSIS — R109 Unspecified abdominal pain: Secondary | ICD-10-CM | POA: Diagnosis not present

## 2018-10-05 DIAGNOSIS — R918 Other nonspecific abnormal finding of lung field: Secondary | ICD-10-CM | POA: Diagnosis not present

## 2018-10-05 DIAGNOSIS — F329 Major depressive disorder, single episode, unspecified: Secondary | ICD-10-CM

## 2018-10-05 NOTE — Telephone Encounter (Signed)
Pt states that she has been having lower abdominal cramping for about 6 months. Pt states that she did not  get her [place] of her ovaries completed because of other issues. Pt advised to reschedule appt for [place]. Please advise for abdominal pain.

## 2018-10-05 NOTE — Progress Notes (Signed)
CT chest shows multiple bilateral irregular pulmonary nodules.  Including the irregular nodule seen right lower lobe on prior CT imaging.  This has increased in size now measuring 11 mm.  I emphasized again the importance that you need to stop smoking.  Stop smoking marijuana.  And stop using illegal substances.  I will discuss these results with Dr. Halford Chessman.  For right now we will plan on tentatively following this with a CT again in 3 months.  Please place order for CT chest without contrast in 3 months.    After I speak with Dr. Halford Chessman we will follow back up with you.  Please ensure the patient has a scheduled follow-up with Dr. Halford Chessman over the next 4 to 6 weeks.  Wyn Quaker, FNP

## 2018-10-05 NOTE — Telephone Encounter (Signed)
ATC pt, there was no answer and I could not leave a message. Will try back. 

## 2018-10-05 NOTE — Patient Instructions (Signed)
I do not think you have MS.  None of the testing performed even looks for MS.  I am not sure why somebody told you that, but I do not think you need further neurologic workup.

## 2018-10-09 NOTE — Telephone Encounter (Signed)
Called patient. She did not answer and I could not leave a VM. Will call back later.

## 2018-10-10 NOTE — Progress Notes (Signed)
Subjective: "Forgive me, but I have no faith in Doctors. I've had so much pain." Catherine Osborne presents to clinic with cc of painful hyperkeratotic lesion plantar aspect of left foot. She is s/p 5th met head resection for painful plantar lesion.  Lesion is  aggravated when weightbearing with and without shoe gear.  This pain limits her daily activities. Pain symptoms resolve with periodic professional debridement.  [facility] is her PCP.    Current Outpatient Medications:  .  albuterol (PROVENTIL) (2.5 MG/3ML) 0.083% nebulizer solution, Take 3 mLs (2.5 mg total) by nebulization every 6 (six) hours as needed for wheezing or shortness of breath., Disp: 150 mL, Rfl: 3 .  albuterol (VENTOLIN HFA) 108 (90 Base) MCG/ACT inhaler, Inhale 1-2 puffs into the lungs every 6 (six) hours as needed for wheezing or shortness of breath., Disp: 1 g, Rfl: 3 .  benzonatate (TESSALON) 100 MG capsule, Take 1 capsule (100 mg total) by mouth every 8 (eight) hours., Disp: 21 capsule, Rfl: 0 .  budesonide-formoterol (SYMBICORT) 80-4.5 MCG/ACT inhaler, Inhale 2 puffs into the lungs 2 (two) times a day., Disp: 1 Inhaler, Rfl: 12 .  budesonide-formoterol (SYMBICORT) 80-4.5 MCG/ACT inhaler, Inhale 2 puffs into the lungs 2 (two) times a day., Disp: 1 Inhaler, Rfl: 3 .  omeprazole (PRILOSEC) 20 MG capsule, Take 1 capsule (20 mg total) by mouth daily for 14 days., Disp: 14 capsule, Rfl: 0 .  ondansetron (ZOFRAN) 4 MG tablet, Take 1 tablet (4 mg total) by mouth every 8 (eight) hours as needed for nausea or vomiting., Disp: 21 tablet, Rfl: 0 .  polyethylene glycol (MIRALAX / GLYCOLAX) 17 g packet, Take 17 g by mouth daily as needed for moderate constipation., Disp: 14 each, Rfl: 0   No Known Allergies   Objective: Physical Examination:  Vascular  Examination: Capillary refill time immediate x 10 digits.  Palpable DP/PT pulses b/l.  Digital hair present b/l.  No edema noted b/l.  Skin temperature  gradient WNL b/l.  Dermatological Examination: Skin with normal turgor, texture and tone b/l.  No open wounds b/l.  No interdigital macerations noted b/l.  Toenails 1-5 b/l well maintained.   Porokeratotic lesion plantar 5th metatarsal with tenderness to palpation. No erythema, no edema, no drainage, no flocculence.  Musculoskeletal Examination: Muscle strength 5/5 to all muscle groups b/l.  No pain, crepitus or joint discomfort with active/passive ROM.  Neurological Examination: Sensation intact 5/5 b/l with 10 gram monofilament.  Vibratory sensation intact b/l.  Proprioceptive sensation intact b/l.  Assessment: Painful porokeratosis submet 5 left foot   Plan: 1. Porokeratosis submet 5th left foot pared and enucleated with sterile scalpel blade without incident. 2. Will discuss her case with our Pedorthist to see if we can get her offloaded in this painful area. Continue soft, supportive shoe gear daily. 3. Report any pedal injuries to medical professional. 4. Follow up 6 weeks for re-evaluation. 5. Patient/POA to call should there be a question/concern in there interim.

## 2018-10-11 ENCOUNTER — Encounter: Payer: Self-pay | Admitting: *Deleted

## 2018-10-11 NOTE — Telephone Encounter (Signed)
ATC pt, there was no answer and I could not leave a message. We have attempted to contact pt several times with no success or call back from pt. Per triage protocol, message will be closed.  

## 2018-10-12 ENCOUNTER — Telehealth: Payer: Self-pay | Admitting: Pulmonary Disease

## 2018-10-12 DIAGNOSIS — R918 Other nonspecific abnormal finding of lung field: Secondary | ICD-10-CM

## 2018-10-12 DIAGNOSIS — R0602 Shortness of breath: Secondary | ICD-10-CM

## 2018-10-12 NOTE — Telephone Encounter (Signed)
Spoke with pt and discussed chest CT results per Wyn Quaker, NP.  Pt verbalized understanding. F/U Chest CT ordered for 3 months. Appt made with Dr Halford Chessman 11/15/18 9:45.  Nothing further needed at this time.

## 2018-10-19 ENCOUNTER — Other Ambulatory Visit: Payer: Self-pay

## 2018-10-19 ENCOUNTER — Ambulatory Visit: Payer: Medicaid Other

## 2018-10-19 ENCOUNTER — Other Ambulatory Visit (HOSPITAL_COMMUNITY)
Admission: RE | Admit: 2018-10-19 | Discharge: 2018-10-19 | Disposition: A | Discharge status: Home / Self Care | Payer: Medicaid Other | Source: Ambulatory Visit | Attending: Obstetrics | Admitting: Obstetrics

## 2018-10-19 ENCOUNTER — Other Ambulatory Visit: Payer: Self-pay | Admitting: Obstetrics

## 2018-10-19 DIAGNOSIS — N898 Other specified noninflammatory disorders of vagina: Secondary | ICD-10-CM

## 2018-10-19 NOTE — Progress Notes (Signed)
Pt presents for vaginal discharge, odor, and irritation. Sx started about a month ago. She states that she is spotting with her cycle right now which started on Monday 9/14. Pt would like to be checked for STD as well as yeast and bv. Self Swab performed. Pt advised that results will take 24-48 hours to be received.

## 2018-10-22 ENCOUNTER — Other Ambulatory Visit: Payer: Self-pay | Admitting: Obstetrics

## 2018-10-22 ENCOUNTER — Telehealth: Payer: Self-pay | Admitting: Pulmonary Disease

## 2018-10-22 ENCOUNTER — Telehealth: Payer: Self-pay

## 2018-10-22 DIAGNOSIS — N760 Acute vaginitis: Secondary | ICD-10-CM

## 2018-10-22 DIAGNOSIS — B9689 Other specified bacterial agents as the cause of diseases classified elsewhere: Secondary | ICD-10-CM

## 2018-10-22 LAB — CERVICOVAGINAL ANCILLARY ONLY
Bacterial Vaginitis (gardnerella): POSITIVE — AB
Candida Glabrata: NEGATIVE
Candida Vaginitis: NEGATIVE
Molecular Disclaimer: NEGATIVE
Molecular Disclaimer: NEGATIVE
Molecular Disclaimer: NEGATIVE
Molecular Disclaimer: NORMAL
Trichomonas: NEGATIVE

## 2018-10-22 MED ORDER — TINIDAZOLE 500 MG PO TABS: 1000.0000 mg | ORAL_TABLET | Freq: Every day | ORAL | 2 refills | Status: DC

## 2018-10-22 NOTE — Telephone Encounter (Signed)
Spoke with pt, advised pt of the results. She had a lot of questions regarding nodule and wondered why we were going to wait another 3 months if the nodule was growing. She would like to speak to [place]. Please call.    Notes recorded by Lauraine Rinne, NP on 10/05/2018 at 4:24 PM EDT  CT chest shows multiple bilateral irregular pulmonary nodules. Including the irregular nodule seen right lower lobe on prior CT imaging. This has increased in size now measuring 11 mm.   I emphasized again the importance that you need to stop smoking. Stop smoking marijuana. And stop using illegal substances.   I will discuss these results with Dr. Halford Chessman. For right now we will plan on tentatively following this with a CT again in 3 months.   Please place order for CT chest without contrast in 3 months.    After I speak with Dr. Halford Chessman we will follow back up with you.   Please ensure the patient has a scheduled follow-up with Dr. Halford Chessman over the next 4 to 6 weeks.   Wyn Quaker, FNP

## 2018-10-22 NOTE — Telephone Encounter (Signed)
Dr.Harper called pt twice regarding recent results pt did not answer.

## 2018-10-22 NOTE — Telephone Encounter (Signed)
Spoke with pt, she is ok with coming in to see Aaron Edelman because she doesn't have a computer. I made her an appt with Aaron Edelman tomorrow 10/23/2018 at 10:30am. Nothing further is needed.

## 2018-10-22 NOTE — Telephone Encounter (Signed)
We have reviewed this multiple times with the patient.  We do not simply repeat CTs on a weekly basis.  3 months is a normal turnaround for following up with the CT.If the patient would like to discuss this further.  She can be scheduled for a video visit or in an office visit.Wyn Quaker, FNP

## 2018-10-23 ENCOUNTER — Encounter: Payer: Self-pay | Admitting: Pulmonary Disease

## 2018-10-23 ENCOUNTER — Ambulatory Visit: Payer: Medicaid Other | Admitting: Pulmonary Disease

## 2018-10-23 ENCOUNTER — Other Ambulatory Visit: Payer: Self-pay | Admitting: Obstetrics

## 2018-10-23 ENCOUNTER — Other Ambulatory Visit: Payer: Self-pay

## 2018-10-23 ENCOUNTER — Telehealth: Payer: Self-pay

## 2018-10-23 ENCOUNTER — Ambulatory Visit (INDEPENDENT_AMBULATORY_CARE_PROVIDER_SITE_OTHER): Payer: Medicaid Other

## 2018-10-23 VITALS — BP 114/78 | HR 83 | Temp 97.0°F | Ht 62.0 in | Wt 131.4 lb

## 2018-10-23 DIAGNOSIS — B9689 Other specified bacterial agents as the cause of diseases classified elsewhere: Secondary | ICD-10-CM

## 2018-10-23 DIAGNOSIS — J4520 Mild intermittent asthma, uncomplicated: Secondary | ICD-10-CM | POA: Diagnosis not present

## 2018-10-23 DIAGNOSIS — R918 Other nonspecific abnormal finding of lung field: Secondary | ICD-10-CM | POA: Diagnosis not present

## 2018-10-23 DIAGNOSIS — F32A Depression, unspecified: Secondary | ICD-10-CM

## 2018-10-23 DIAGNOSIS — J302 Other seasonal allergic rhinitis: Secondary | ICD-10-CM

## 2018-10-23 DIAGNOSIS — R110 Nausea: Secondary | ICD-10-CM

## 2018-10-23 DIAGNOSIS — R0602 Shortness of breath: Secondary | ICD-10-CM

## 2018-10-23 DIAGNOSIS — F1910 Other psychoactive substance abuse, uncomplicated: Secondary | ICD-10-CM

## 2018-10-23 DIAGNOSIS — N939 Abnormal uterine and vaginal bleeding, unspecified: Secondary | ICD-10-CM

## 2018-10-23 DIAGNOSIS — Z3202 Encounter for pregnancy test, result negative: Secondary | ICD-10-CM

## 2018-10-23 DIAGNOSIS — F329 Major depressive disorder, single episode, unspecified: Secondary | ICD-10-CM

## 2018-10-23 DIAGNOSIS — R102 Pelvic and perineal pain: Secondary | ICD-10-CM

## 2018-10-23 LAB — CERVICOVAGINAL ANCILLARY ONLY
Chlamydia: NEGATIVE
Neisseria Gonorrhea: NEGATIVE

## 2018-10-23 LAB — POCT URINE PREGNANCY: Preg Test, Ur: NEGATIVE

## 2018-10-23 MED ORDER — SERTRALINE HCL 50 MG PO TABS: 50.0000 mg | ORAL_TABLET | Freq: Every day | ORAL | 11 refills | Status: DC

## 2018-10-23 MED ORDER — METRONIDAZOLE 500 MG PO TABS: 500.0000 mg | ORAL_TABLET | Freq: Two times a day (BID) | ORAL | 2 refills | Status: DC

## 2018-10-23 NOTE — Telephone Encounter (Signed)
Please provide update regarding referral to PCP and Psychiatry per B Mack. Thanks.

## 2018-10-23 NOTE — Telephone Encounter (Signed)
Ok great. Will let Aaron Edelman know. Thanks.

## 2018-10-23 NOTE — Assessment & Plan Note (Signed)
Plan: Patient needs to complete pulmonary function test

## 2018-10-23 NOTE — Progress Notes (Signed)
@Patient  ID: Catherine Osborne, female    DOB: 1976-01-16, 43 y.o.   MRN: [id number]  Chief Complaint  Patient presents with   Follow-up    She reports her breathing has been at her baseline. She reports she feels like she is having panick attacks. She reports she has noticed during sex her SOB episodes are worse. She reports increased inhaler use during sex. She reports she has cut back to about 5 blunts per day.     Referring provider: Care, Lenord Carbo*  HPI:  43 year old female current every day marijuana smoker (30 blunts a day) and every day illicit drug user, former cigarette smoker (quit January/2020).  Patient also with recent CT abdomen in January/2020 which showed a 6 mm right lower lobe nodule.  Followed in our office for asthma.  PMH: Substance abuse history, depression, allergies,  Smoker/ Smoking History: Former Smoker. 1 ppd. 15 pack year.  Maintenance: Symbicort 80 Pt of: Dr. Halford Chessman   10/23/2018  - Visit   43 year old female current every day smoker, current every day marijuana user, current every day illicit drug user presenting to our office for a follow-up appointment to review recent CT results.  Patient recently completed a CT chest to further evaluate the pulmonary nodule that was seen on her CT abdomen that was completed in June/2020.  Patient CT chest results are listed below:  10/05/2018-CT chest without contrast- multiple bilateral irregular pulmonary nodules including an irregular nodule of the dependent right lower lobe seen on prior exam which has increased in size now measuring 11 mm, multiple additional irregular nodules throughout the lungs measure up to 2.3 x 0.9 cm in the left apex.  This unusual appearance of multiple nodules suggest an inflammatory process such as organizing pneumonia.  Metastatic disease is not favored although not excluded, recommend CT follow-up in 3 months to assess stability or evolution, if there is high clinical correlation of  malignancy the largest of the nodules could be characterized for metabolic activity on PET  When patient was contacted regarding these results patient had multiple questions prompting this office visit to be scheduled.  Patient also reporting she is had increased shortness of breath with physical activity lately specifically when having sex.  She is been having use her rescue inhaler more during that time.  Patient reports she is using her rescue inhaler about 2 times daily.  Patient was previously referred to primary care, psychiatry as well as neurology at last office visit.  Patient did follow-up with neurology where it was believed the patient does not have any sort of clinical findings of myasthenia gravis which patient had reported.  Patient has not yet established with a primary care provider or with a psychiatrist.   Tests:   07/05/2018-CT abdomen pelvis with contrast- 6 mm right lower lobe nodule, indeterminate, noncontrast chest CT at 6 to 12 months is recommended if the nodule stable at time of repeat CT then future CT at 18 to 24 months from today's scan is considered optional for low risk patients but recommended for high risk patients  02/07/2018-spirometry - FVC 3.81 (132% predicted, ratio 53, FEV1  (85% predicted)  10/05/2018-CT chest without contrast- multiple bilateral irregular pulmonary nodules including an irregular nodule of the dependent right lower lobe seen on prior exam which has increased in size now measuring 11 mm, multiple additional irregular nodules throughout the lungs measure up to 2.3 x 0.9 cm in the left apex.  This unusual appearance of multiple nodules  suggest an inflammatory process such as organizing pneumonia.  Metastatic disease is not favored although not excluded, recommend CT follow-up in 3 months to assess stability or evolution, if there is high clinical correlation of malignancy the largest of the nodules could be characterized for metabolic activity on  PET   FENO:  No results found for: NITRICOXIDE  PFT: No flowsheet data found.  SIX MIN WALK 12/07/2015  Supplimental Oxygen during Test? (L/min) No  Tech Comments: completed walk with no complications    Imaging: Ct Chest Wo Contrast  Result Date: 10/05/2018 CLINICAL DATA:  Lung nodule EXAM: CT CHEST WITHOUT CONTRAST TECHNIQUE: Multidetector CT imaging of the chest was performed following the standard protocol without IV contrast. COMPARISON:  CT abdomen pelvis, 07/05/2018, 12/04/2016 FINDINGS: Cardiovascular: No significant vascular findings. Normal heart size. No pericardial effusion. Mediastinum/Nodes: No enlarged mediastinal, hilar, or axillary lymph nodes. Thyroid gland, trachea, and esophagus demonstrate no significant findings. Lungs/Pleura: There are multiple bilateral irregular pulmonary nodules, including an irregular nodule of the dependent right lower lobe seen on prior examination, which has increased in size, now measuring 11 mm. Multiple additional irregular nodules throughout the lungs measure up to 2.3 x 0.9 cm in the left apex (series 3, image 24). No pleural effusion or pneumothorax. Upper Abdomen: No acute abnormality. Musculoskeletal: No chest wall mass or suspicious bone lesions identified. IMPRESSION: There are multiple bilateral irregular pulmonary nodules, including an irregular nodule of the dependent right lower lobe seen on prior examination, which has increased in size, now measuring 11 mm. Multiple additional irregular nodules throughout the lungs measure up to 2.3 x 0.9 cm in the left apex (series 3, image 24). This unusual appearance of multiple nodules suggests an inflammatory process such as organizing pneumonia. Metastatic disease is not favored although not strictly excluded. Recommend CT follow-up in 3 months to assess for stability or evolution. If there is high clinical concern for malignancy, the largest of these nodules could be characterized for metabolic  activity by PET-CT. Electronically Signed   By: Eddie Candle M.D.   On: 10/05/2018 16:10    Lab Results:  CBC    Component Value Date/Time   WBC 7.9 09/28/2018 0248   RBC 5.03 09/28/2018 0248   HGB 14.8 09/28/2018 0248   HCT 44.3 09/28/2018 0248   PLT 262 09/28/2018 0248   MCV 88.1 09/28/2018 0248   MCH 29.4 09/28/2018 0248   MCHC 33.4 09/28/2018 0248   RDW 13.9 09/28/2018 0248   LYMPHSABS 2.8 07/05/2018 2108   MONOABS 0.7 07/05/2018 2108   EOSABS 0.2 07/05/2018 2108   BASOSABS 0.1 07/05/2018 2108    BMET    Component Value Date/Time   NA 137 09/28/2018 0248   K 3.6 09/28/2018 0248   CL 106 09/28/2018 0248   CO2 21 (L) 09/28/2018 0248   GLUCOSE 98 09/28/2018 0248   BUN 12 09/28/2018 0248   CREATININE 0.77 09/28/2018 0248   CALCIUM 9.4 09/28/2018 0248   GFRNONAA >60 09/28/2018 0248   GFRAA >60 09/28/2018 0248    BNP No results found for: BNP  ProBNP No results found for: PROBNP  Specialty Problems      Pulmonary Problems   Asthma    02/07/2018-spirometry - FVC 3.81 (132% predicted, ratio 53, FEV1 2 (85% predicted)       Bronchitis, acute   Shortness of breath      No Known Allergies  Immunization History  Administered Date(s) Administered   Influenza,inj,Quad PF,6+ Mos 10/31/2016, 01/03/2018  Tdap 06/13/2015    Past Medical History:  Diagnosis Date   Allergy    Asthma    Headache    Lactose intolerance in adult    Pneumonia    Prior miscarriage with pregnancy in first trimester, antepartum    Vaginal Pap smear, abnormal     Tobacco History: Social History   Tobacco Use  Smoking Status Former Smoker   Packs/day: 0.75   Years: 20.00   Pack years: 15.00   Types: Cigarettes   Quit date: 01/31/2018   Years since quitting: 0.7  Smokeless Tobacco Never Used  Tobacco Comment   marijuana trying to decrease from 20 blunts day to 5 now   Counseling given: Not Answered Comment: marijuana trying to decrease from 20 blunts day  to 5 now   Continue to not smoke  Outpatient Encounter Medications as of 10/23/2018  Medication Sig   albuterol (PROVENTIL) (2.5 MG/3ML) 0.083% nebulizer solution Take 3 mLs (2.5 mg total) by nebulization every 6 (six) hours as needed for wheezing or shortness of breath.   albuterol (VENTOLIN HFA) 108 (90 Base) MCG/ACT inhaler Inhale 1-2 puffs into the lungs every 6 (six) hours as needed for wheezing or shortness of breath.   benzonatate (TESSALON) 100 MG capsule Take 1 capsule (100 mg total) by mouth every 8 (eight) hours.   budesonide-formoterol (SYMBICORT) 80-4.5 MCG/ACT inhaler Inhale 2 puffs into the lungs 2 (two) times a day.   budesonide-formoterol (SYMBICORT) 80-4.5 MCG/ACT inhaler Inhale 2 puffs into the lungs 2 (two) times a day.   ondansetron (ZOFRAN) 4 MG tablet Take 1 tablet (4 mg total) by mouth every 8 (eight) hours as needed for nausea or vomiting.   polyethylene glycol (MIRALAX / GLYCOLAX) 17 g packet Take 17 g by mouth daily as needed for moderate constipation.   sertraline (ZOLOFT) 100 MG tablet Take 100 mg by mouth daily.   tinidazole (TINDAMAX) 500 MG tablet Take 2 tablets (1,000 mg total) by mouth daily with breakfast.   omeprazole (PRILOSEC) 20 MG capsule Take 1 capsule (20 mg total) by mouth daily for 14 days.   No facility-administered encounter medications on file as of 10/23/2018.      Review of Systems  Review of Systems  Constitutional: Positive for fatigue. Negative for activity change and fever.  HENT: Positive for postnasal drip, rhinorrhea and voice change (Hoarseness). Negative for sinus pressure, sinus pain and sore throat.   Eyes: Positive for itching.  Respiratory: Positive for cough, shortness of breath and wheezing.   Cardiovascular: Negative for chest pain and palpitations.  Gastrointestinal: Negative for diarrhea, nausea and vomiting.  Musculoskeletal: Negative for arthralgias.  Neurological: Negative for dizziness.   Psychiatric/Behavioral: Positive for decreased concentration. Negative for sleep disturbance. The patient is nervous/anxious.        Heavy substance abuse Drinks heavily throughout the day Routinely uses marijuana, cocaine and ecstasy     Physical Exam  BP 114/78    Pulse 83    Temp (!) 97 F (36.1 C) (Temporal)    Ht 5\' 2"  (1.575 m)    Wt 131 lb 6.4 oz (59.6 kg)    SpO2 100%    BMI 24.03 kg/m   Wt Readings from Last 5 Encounters:  10/23/18 131 lb 6.4 oz (59.6 kg)  10/05/18 134 lb (60.8 kg)  08/21/18 126 lb (57.2 kg)  07/03/18 134 lb (60.8 kg)  06/13/18 129 lb 6.4 oz (58.7 kg)    BMI Readings from Last 5 Encounters:  10/23/18 24.03  kg/m  10/05/18 24.51 kg/m  08/21/18 23.05 kg/m  07/03/18 24.51 kg/m  06/13/18 23.67 kg/m     Physical Exam Vitals signs and nursing note reviewed.  Constitutional:      General: She is not in acute distress.    Appearance: Normal appearance. She is normal weight.  HENT:     Head: Normocephalic and atraumatic.     Right Ear: Tympanic membrane, ear canal and external ear normal. There is no impacted cerumen.     Left Ear: Tympanic membrane, ear canal and external ear normal. There is no impacted cerumen.     Nose: Mucosal edema and rhinorrhea present.     Mouth/Throat:     Mouth: Mucous membranes are moist.     Pharynx: Oropharynx is clear.     Comments: Postnasal drip Eyes:     Pupils: Pupils are equal, round, and reactive to light.  Neck:     Musculoskeletal: Normal range of motion.  Cardiovascular:     Rate and Rhythm: Normal rate and regular rhythm.     Pulses: Normal pulses.     Heart sounds: Normal heart sounds. No murmur.  Pulmonary:     Effort: Pulmonary effort is normal. No respiratory distress.     Breath sounds: Normal breath sounds. No decreased air movement. No decreased breath sounds, wheezing or rales.  Musculoskeletal:     Right lower leg: No edema.     Left lower leg: No edema.  Skin:    General: Skin is warm  and dry.     Capillary Refill: Capillary refill takes less than 2 seconds.  Neurological:     General: No focal deficit present.     Mental Status: She is alert and oriented to person, place, and time. Mental status is at baseline.     Gait: Gait normal.  Psychiatric:        Mood and Affect: Mood is anxious.        Behavior: Behavior normal.        Thought Content: Thought content normal.        Judgment: Judgment normal.       Assessment & Plan:   Asthma Plan: Continue Symbicort 80 We will need to get patient scheduled for pulmonary function testing Patient left office prior to scheduling pulmonary function test, will have scheduler contact patient to get scheduled Patient needs to start daily antihistamine Patient can start Flonase 1 spray each nostril as needed for nasal congestion  Abnormal findings on diagnostic imaging of lung Discussed CT with patient today.  Also discussed case with Dr. Halford Chessman.  Plan: Repeat CT chest in 3 months in December/2020 to further evaluate and monitor May need to consider PET scan after that CT Emphasized the importance of the patient she needs to stop smoking, stop smoking marijuana, and stop doing illicit drugs  Shortness of breath Plan: Patient needs to complete pulmonary function test  Seasonal allergies Plan: Start daily antihistamine Start Flonase 1 spray each nostril as needed for nasal congestion  Substance abuse ([place]) Plan: Continue to not smoke cigarettes You need to stop drinking You need to stop smoking marijuana You need to stop illicit drugs such as cocaine as well as illicit pharmaceuticals Distance yourself from boyfriend as well as friends who also participate in illicit drug use  Referral to psychiatry Referral to primary care    Return in about 6 weeks (around 12/04/2018), or if symptoms worsen or fail to improve, for Follow up for PFT, Follow  up with Dr. Halford Chessman.   Lauraine Rinne, NP 10/23/2018   This  appointment was 42 minutes long with over 50% of the time in direct face-to-face patient care, assessment, plan of care, and follow-up.

## 2018-10-23 NOTE — Assessment & Plan Note (Signed)
Plan: Continue to not smoke cigarettes You need to stop drinking You need to stop smoking marijuana You need to stop illicit drugs such as cocaine as well as illicit pharmaceuticals Distance yourself from boyfriend as well as friends who also participate in illicit drug use  Referral to psychiatry Referral to primary care 

## 2018-10-23 NOTE — Patient Instructions (Addendum)
You were seen today by Lauraine Rinne, NP  for:   1. Substance abuse [facility])  We will follow-up regarding the psychiatry referral that we placed in July/2020  We will also follow-up with your referral to primary care  Please continue to work on stopping smoking marijuana Please stop all other illicit drug use such as ecstasy  2. Mild intermittent asthma without complication  Continue Symbicort 80 >>> 2 puffs in the morning right when you wake up, rinse out your mouth after use, 12 hours later 2 puffs, rinse after use >>> Take this daily, no matter what >>> This is not a rescue inhaler   We will get you scheduled for a pulmonary function test to further evaluate your breathing  Please start taking a daily antihistamine:  >>>choose one of: zyrtec, claritin, allegra, or xyzal  >>>these are over the counter medications  >>>can choose generic option  >>>take daily  >>>this medication helps with allergies, post nasal drip, and cough   Can also start using Flonase 1 spray each nostril as needed for nasal congestion or allergy-like symptoms   3. Shortness of breath  Continue to work on stopping smoking  We will get you set up for a pulmonary function test to further evaluate your breathing   4. Abnormal findings on diagnostic imaging of lung  I will discuss your CT results with Dr. Halford Chessman for right now we will plan on repeating your CT in 3 months.  Follow Up:     Return in about 6 weeks (around 12/04/2018), or if symptoms worsen or fail to improve, for Follow up for PFT, Follow up with Dr. Halford Chessman.   Please do your part to reduce the spread of COVID-19:      Reduce your risk of any infection  and COVID19 by using the similar precautions used for avoiding the common cold or flu:  Marland Kitchen Wash your hands often with soap and warm water for at least 20 seconds.  If soap and water are not readily available, use an alcohol-based hand sanitizer with at least 60% alcohol.  . If coughing or  sneezing, cover your mouth and nose by coughing or sneezing into the elbow areas of your shirt or coat, into a tissue or into your sleeve (not your hands). Langley Gauss A MASK when in public  . Avoid shaking hands with others and consider head nods or verbal greetings only. . Avoid touching your eyes, nose, or mouth with unwashed hands.  . Avoid close contact with people who are sick. . Avoid places or events with large numbers of people in one location, like concerts or sporting events. . If you have some symptoms but not all symptoms, continue to monitor at home and seek medical attention if your symptoms worsen. . If you are having a medical emergency, call 911.   [place] / e-Visit: [email or web address]         [facility]: [facility]: [id number]                   [facility]: [id number]     It is flu season:   >>> Best ways to protect herself from the flu: Receive the yearly flu vaccine, practice good hand hygiene washing with soap and also using hand sanitizer when available, eat a nutritious meals, get adequate rest, hydrate appropriately   Please contact the office if your symptoms worsen or  you have concerns that you are not improving.   Thank you for choosing [facility] for your healthcare, and for allowing [place] to partner with you on your healthcare journey. I am thankful to be able to provide care to you today.   Wyn Quaker FNP-C

## 2018-10-23 NOTE — Addendum Note (Signed)
Addended by: Lauraine Rinne on: 10/23/2018 03:20 PM   Modules accepted: Orders

## 2018-10-23 NOTE — Progress Notes (Signed)
Catherine Osborne presents today for UPT. She complains of nausea and weight gain. LMP: 10/03/2018    OBJECTIVE: Appears well, in no apparent distress.  OB History    Gravida  14   Para  2   Term  1   Preterm  1   AB  12   Living  1     SAB  10   TAB  2   Ectopic      Multiple  0   Live Births  1          Home UPT Result: Didn't take one per pt  In-Office UPT result: Negative  I have reviewed the patient's medical, obstetrical, social, and family histories, and medications.   ASSESSMENT: Negative pregnancy test  PLAN: Follow up PRN

## 2018-10-23 NOTE — Telephone Encounter (Signed)
Catherine Osborne just FYI I will attempt to call after lunch.

## 2018-10-23 NOTE — Assessment & Plan Note (Addendum)
Plan: Continue Symbicort 80 We will need to get patient scheduled for pulmonary function testing Patient left office prior to scheduling pulmonary function test, will have scheduler contact patient to get scheduled Patient needs to start daily antihistamine Patient can start Flonase 1 spray each nostril as needed for nasal congestion

## 2018-10-23 NOTE — Assessment & Plan Note (Signed)
Plan: Start daily antihistamine Start Flonase 1 spray each nostril as needed for nasal congestion

## 2018-10-23 NOTE — Telephone Encounter (Signed)
Hey [place] I will resend the pcp referral but the Psychiatry referrals we dont have access to once they are put in they go over to them directly I believe

## 2018-10-23 NOTE — Assessment & Plan Note (Signed)
Discussed CT with patient today.  Also discussed case with Dr. Halford Chessman.  Plan: Repeat CT chest in 3 months in December/2020 to further evaluate and monitor May need to consider PET scan after that CT Emphasized the importance of the patient she needs to stop smoking, stop smoking marijuana, and stop doing illicit drugs

## 2018-10-24 NOTE — Progress Notes (Signed)
Reviewed and agree with assessment/plan.   Swayze Pries, MD [facility] 01/27/2016, 12:24 PM Pager:  [phone]  

## 2018-10-25 ENCOUNTER — Telehealth: Payer: Self-pay | Admitting: Pulmonary Disease

## 2018-10-25 NOTE — Telephone Encounter (Signed)
LMTCB

## 2018-10-25 NOTE — Telephone Encounter (Signed)
10/21/2018 1041  Triage, Please contact the patient and notify her that because she is [place] access for her insurance coverage.  She will need to follow-up with the primary care provider who is on her card.  If she does not like the primary care provider that is listed on her card and she will need to call her case manager as listed on that card.  We are unable to do a referral based off of her stated insurance.  Also as far as with psychiatry referrals were unable to physically make the appointment for the patient.  She will need to contact behavioral health herself to schedule an appointment:  [facility]: [place] FNP

## 2018-10-26 NOTE — Telephone Encounter (Signed)
Pt scheduled on 11/10 -pr

## 2018-10-30 ENCOUNTER — Telehealth: Payer: Self-pay | Admitting: Pulmonary Disease

## 2018-10-30 NOTE — Telephone Encounter (Signed)
ATC patient - line rang numerous times then went to busy signal.  WCB.

## 2018-10-31 NOTE — Telephone Encounter (Signed)
ATC pt, no answer and I could not leave a message due to the line going to a busy signal. Will try back.

## 2018-11-01 ENCOUNTER — Ambulatory Visit (HOSPITAL_COMMUNITY)
Admission: EM | Admit: 2018-11-01 | Discharge: 2018-11-01 | Disposition: A | Discharge status: Home / Self Care | Payer: Medicaid Other | Attending: Family Medicine | Admitting: Family Medicine

## 2018-11-01 ENCOUNTER — Other Ambulatory Visit: Payer: Self-pay

## 2018-11-01 ENCOUNTER — Encounter (HOSPITAL_COMMUNITY): Payer: Self-pay

## 2018-11-01 DIAGNOSIS — J4541 Moderate persistent asthma with (acute) exacerbation: Secondary | ICD-10-CM

## 2018-11-01 MED ORDER — PREDNISONE 10 MG (21) PO TBPK: ORAL_TABLET | Freq: Every day | ORAL | 0 refills | Status: DC

## 2018-11-01 NOTE — ED Triage Notes (Addendum)
Pt cc she has her asthma flaring up x 1 week. Pt states she has a bad chest cold. Pt declines a Covid test at this time. Pt states she has been coughing up white mucus.

## 2018-11-01 NOTE — Telephone Encounter (Signed)
ATC patient unable to reach, no VM set up went straight to a busy signal.

## 2018-11-01 NOTE — Telephone Encounter (Signed)
ATC, NA and no option to leave msg 

## 2018-11-02 NOTE — Telephone Encounter (Signed)
Patient wanted Dr.Galaway to be aware, that she has been experiencing muscle spasms since yesterday on her LT ft she had Sx on. Would like to know, if there is anything she can do to alleviate the discomfort she's experiencing.

## 2018-11-06 NOTE — ED Provider Notes (Signed)
[name]   [id number] 11/01/18 Arrival Time: [id number]  ASSESSMENT & PLAN:  1. Moderate persistent asthma with exacerbation     No indication for chest imaging at this time. Discussed.  To begin: Meds ordered this encounter  Medications  . predniSONE (STERAPRED UNI-PAK 21 TAB) 10 MG (21) TBPK tablet    Sig: Take by mouth daily. Take as directed.    Dispense:  21 tablet    Refill:  0   Declines COVID testing. Asthma precautions given. OTC symptom care as needed.  Recommend: Follow-up Information    [facility].   Specialty: Family Medicine Why: As needed. Contact information: [address] [place] [address] [phone]        [place].   Specialty: Urgent Care Why: If worsening or failing to improve as anticipated. Contact information: [place] [phone]          Reviewed expectations re: course of current medical issues. Questions answered. Outlined signs and symptoms indicating need for more acute intervention. Patient verbalized understanding. After Visit Summary given.  SUBJECTIVE: History from: patient.  Catherine Osborne is a 43 y.o. female who presents with complaint of fairly persistent chest tightness and wheezing. Onset gradual, over the past week. Triggers: change in weather. Describes wheezing as moderate when present. Fever: no. Overall normal PO intake without n/v. Sick contacts: no. Ambulatory without difficulty. No LE edema. Typically her asthma is well controlled. Inhaler use: prn; more often recently. OTC treatment: none.   Social History   Tobacco Use  Smoking Status Former Smoker  . Packs/day: 0.75  . Years: 20.00  . Pack years: 15.00  . Types: Cigarettes  . Quit date: 01/31/2018  . Years since quitting: 0.7  Smokeless Tobacco Never Used  Tobacco Comment   marijuana trying to decrease from  20 blunts day to 5 now    ROS: As per HPI. All other systems negative.    OBJECTIVE:  Vitals:   11/01/18 1703 11/01/18 1704  BP: (!) 135/95   Pulse: 84   Resp: 16   Temp: 98.2 F (36.8 C)   TempSrc: Oral   SpO2: 98%   Weight:  59.9 kg     General appearance: alert; NAD HEENT: [place]; AT; mild nasal congestion Neck: supple without LAD CV: RRR without murmer Lungs: unlabored respirations, moderate bilateral expiratory wheezing; cough: absent; no significant respiratory distress Abd: soft; non-tender Skin: warm and dry Psychological: alert and cooperative; normal mood and affect   No Known Allergies  Past Medical History:  Diagnosis Date  . Allergy   . Asthma   . Headache   . 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 Pneumonia   . Prior miscarriage with pregnancy in first trimester, antepartum   . Vaginal Pap smear, abnormal    Family History  Adopted: Yes  Problem Relation Age of Onset  . Bone cancer Mother   . Breast cancer Maternal Grandmother   . Diabetes Other   . Hypertension Other   . Stroke Other   . Heart attack Other    Social History   Socioeconomic History  . Marital status: Single    Spouse name: Not on file  . Number of children: Not on file  . Years of education: Not on file  . Highest education level: Not on file  Occupational History  . Not on file  Social Needs  .  Financial resource strain: Not on file  . Food insecurity    Worry: Not on file    Inability: Not on file  . Transportation needs    Medical: Not on file    Non-medical: Not on file  Tobacco Use  . Smoking status: Former Smoker    Packs/day: 0.75    Years: 20.00    Pack years: 15.00    Types: Cigarettes    Quit date: 01/31/2018    Years since quitting: 0.7  . Smokeless tobacco: Never Used  . Tobacco comment: marijuana trying to decrease from 20 blunts day to 5 now  Substance and Sexual Activity  . Alcohol use: Not Currently    Alcohol/week: 1.0 standard drinks    Types: 1  Glasses of wine per week    Comment: rare - no liquor since april 2020/ drinks wine   . Drug use: Yes    Frequency: 210.0 times per week    Types: Marijuana    Comment: 20 blunts a day now to 5 a day - trying to quit  . Sexual activity: Yes    Partners: Male    Birth control/protection: None  Lifestyle  . Physical activity    Days per week: Not on file    Minutes per session: Not on file  . Stress: Not on file  Relationships  . Social Herbalist on phone: Not on file    Gets together: Not on file    Attends religious service: Not on file    Active member of club or organization: Not on file    Attends meetings of clubs or organizations: Not on file    Relationship status: Not on file  . Intimate partner violence    Fear of current or ex partner: Not on file    Emotionally abused: Not on file    Physically abused: Not on file    Forced sexual activity: Not on file  Other Topics Concern  . Not on file  Social History Narrative   Lives top of 3 story home; lives with family; right handed; some college; little caffeine - drinks in winter; exercise none            Vanessa Kick, MD 11/06/18 [phone]

## 2018-11-13 ENCOUNTER — Encounter: Payer: Self-pay | Admitting: Gastroenterology

## 2018-11-13 ENCOUNTER — Encounter

## 2018-11-13 ENCOUNTER — Ambulatory Visit (INDEPENDENT_AMBULATORY_CARE_PROVIDER_SITE_OTHER): Payer: Medicaid Other | Admitting: Gastroenterology

## 2018-11-13 VITALS — BP 92/70 | HR 60 | Temp 98.2°F | Ht 62.0 in | Wt 132.4 lb

## 2018-11-13 DIAGNOSIS — R197 Diarrhea, unspecified: Secondary | ICD-10-CM

## 2018-11-13 DIAGNOSIS — R140 Abdominal distension (gaseous): Secondary | ICD-10-CM | POA: Diagnosis not present

## 2018-11-13 DIAGNOSIS — K909 Intestinal malabsorption, unspecified: Secondary | ICD-10-CM | POA: Diagnosis not present

## 2018-11-13 DIAGNOSIS — R109 Unspecified abdominal pain: Secondary | ICD-10-CM | POA: Diagnosis not present

## 2018-11-13 DIAGNOSIS — E739 Lactose intolerance, unspecified: Secondary | ICD-10-CM

## 2018-11-13 NOTE — Progress Notes (Signed)
Catherine Osborne    [id number]    Oct 27, 1975  Primary Care Physician:[facility]  Referring Physician: [facility] [address] [place],  [place] [address]   Chief complaint: Abdominal pain HPI: 43 year old female with history of polysubstance abuse here with complaints of generalized abdominal discomfort. Patient has had multiple ER visits in June and August 2020 with abdominal pain, she said those episodes were triggered mostly after eating a large bowl of nachos, ice cream or large bowl of cereal with milk.  She knows she is lactose intolerant, she has abdominal bloating, discomfort and diarrhea whenever she consumes milk or dairy products.  " How can I not eat ice cream, tacos, hamburger with cheese or nachos, the Lactaid pills do not work and are expensive"  Denies any vomiting, melena or blood per rectum.  She is worried that her GI symptoms may have caused a miscarriage, as she had excessive bleeding during 1 of her menstrual cycle.  She has been unable to see her GYN. Patient said she had multiple pregnancy test, were all negative in the past few months.  No family history of GI malignancy or IBD  Colonoscopy July 01, 2016: 2 sessile polyps (1 of the polyps was not retrieved, and the other was hyperplastic) removed from transverse colon and rectum, internal hemorrhoids.  CT chest October 05, 2018: Multiple bilateral irregular pulmonary nodules.  Differential includes inflammatory process, organizing pneumonia but cannot exclude metastatic disease.  Outpatient Encounter Medications as of 11/13/2018  Medication Sig  . albuterol (PROVENTIL) (2.5 MG/3ML) 0.083% nebulizer solution Take 3 mLs (2.5 mg total) by nebulization every 6 (six) hours as needed for wheezing or shortness of breath.  Marland Kitchen albuterol (VENTOLIN HFA) 108 (90 Base) MCG/ACT inhaler Inhale 1-2 puffs into the lungs every 6 (six) hours as needed  for wheezing or shortness of breath.  . benzonatate (TESSALON) 100 MG capsule Take 1 capsule (100 mg total) by mouth every 8 (eight) hours.  . budesonide-formoterol (SYMBICORT) 80-4.5 MCG/ACT inhaler Inhale 2 puffs into the lungs 2 (two) times a day.  . budesonide-formoterol (SYMBICORT) 80-4.5 MCG/ACT inhaler Inhale 2 puffs into the lungs 2 (two) times a day.  . metroNIDAZOLE (FLAGYL) 500 MG tablet Take 1 tablet (500 mg total) by mouth 2 (two) times daily.  Marland Kitchen omeprazole (PRILOSEC) 20 MG capsule Take 1 capsule (20 mg total) by mouth daily for 14 days.  . ondansetron (ZOFRAN) 4 MG tablet Take 1 tablet (4 mg total) by mouth every 8 (eight) hours as needed for nausea or vomiting.  . polyethylene glycol (MIRALAX / GLYCOLAX) 17 g packet Take 17 g by mouth daily as needed for moderate constipation.  . predniSONE (STERAPRED UNI-PAK 21 TAB) 10 MG (21) TBPK tablet Take by mouth daily. Take as directed.  . sertraline (ZOLOFT) 100 MG tablet Take 100 mg by mouth daily.  . sertraline (ZOLOFT) 50 MG tablet Take 1 tablet (50 mg total) by mouth daily.  Marland Kitchen tinidazole (TINDAMAX) 500 MG tablet Take 2 tablets (1,000 mg total) by mouth daily with breakfast.   No facility-administered encounter medications on file as of 11/13/2018.     Allergies as of 11/13/2018  . (No Known Allergies)    Past Medical History:  Diagnosis Date  . Allergy   . Asthma   . Headache   . 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 Pneumonia   . Prior  miscarriage with pregnancy in first trimester, antepartum   . Vaginal Pap smear, abnormal     Past Surgical History:  Procedure Laterality Date  . CERVICAL CERCLAGE N/A 02/26/2015   Procedure: CERCLAGE CERVICAL;  Surgeon: Frederico Hamman, MD;  Location: [facility];  Service: Gynecology;  Laterality: N/A;  . DILATION AND CURETTAGE OF UTERUS    . DILATION AND EVACUATION N/A 05/22/2012   Procedure: DILATATION AND EVACUATION;  Surgeon: Frederico Hamman, MD;  Location: [facility];  Service:  Gynecology;  Laterality: N/A;  . FOOT SURGERY      Family History  Adopted: Yes  Problem Relation Age of Onset  . Bone cancer Mother   . Breast cancer Maternal Grandmother   . Diabetes Other   . Hypertension Other   . Stroke Other   . Heart attack Other     Social History   Socioeconomic History  . Marital status: Single    Spouse name: Not on file  . Number of children: Not on file  . Years of education: Not on file  . Highest education level: Not on file  Occupational History  . Not on file  Social Needs  . Financial resource strain: Not on file  . Food insecurity    Worry: Not on file    Inability: Not on file  . Transportation needs    Medical: Not on file    Non-medical: Not on file  Tobacco Use  . Smoking status: Former Smoker    Packs/day: 0.75    Years: 20.00    Pack years: 15.00    Types: Cigarettes    Quit date: 01/31/2018    Years since quitting: 0.7  . Smokeless tobacco: Never Used  . Tobacco comment: marijuana trying to decrease from 20 blunts day to 5 now  Substance and Sexual Activity  . Alcohol use: Not Currently    Alcohol/week: 1.0 standard drinks    Types: 1 Glasses of wine per week    Comment: rare - no liquor since april 2020/ drinks wine   . Drug use: Yes    Frequency: 210.0 times per week    Types: Marijuana    Comment: 20 blunts a day now to 5 a day - trying to quit  . Sexual activity: Yes    Partners: Male    Birth control/protection: None  Lifestyle  . Physical activity    Days per week: Not on file    Minutes per session: Not on file  . Stress: Not on file  Relationships  . Social Herbalist on phone: Not on file    Gets together: Not on file    Attends religious service: Not on file    Active member of club or organization: Not on file    Attends meetings of clubs or organizations: Not on file    Relationship status: Not on file  . Intimate partner violence    Fear of current or ex partner: Not on file     Emotionally abused: Not on file    Physically abused: Not on file    Forced sexual activity: Not on file  Other Topics Concern  . Not on file  Social History Narrative   Lives top of 3 story home; lives with family; right handed; some college; little caffeine - drinks in winter; exercise none      Review of systems: Review of Systems  Constitutional: Negative for fever and chills.  HENT: Negative.   Eyes:  Negative for blurred vision.  Respiratory: Negative for cough, shortness of breath and wheezing.   Cardiovascular: Negative for chest pain and palpitations.  Gastrointestinal: as per HPI Genitourinary: Negative for dysuria, urgency, frequency and hematuria.  Musculoskeletal: Negative for myalgias, back pain and joint pain.  Skin: Negative for itching and rash.  Neurological: Negative for dizziness, tremors, focal weakness, seizures and loss of consciousness.  Endo/Heme/Allergies: Negative Psychiatric/Behavioral: Negative for suicidal ideas and hallucinations.  All other systems reviewed and are negative.   Physical Exam: Vitals:   11/13/18 1055  BP: 92/70  Pulse: 60  Temp: 98.2 F (36.8 C)   Body mass index is 24.22 kg/m. Gen:      No acute distress HEENT:  EOMI, sclera anicteric Neck:     No masses; no thyromegaly Lungs:    Clear to auscultation bilaterally; normal respiratory effort CV:         Regular rate and rhythm; no murmurs Abd:      + bowel sounds; soft, non-tender; no palpable masses, no distension Ext:    No edema; adequate peripheral perfusion Skin:      Warm and dry; no rash Neuro: alert and oriented x 3 Psych: normal mood and affect  Data Reviewed:  Reviewed labs, radiology imaging, old records and pertinent past GI work up   Assessment and Plan/Recommendations:  43 year old female with history of asthma, smoker, polysubstance abuse with complaints of generalized abdominal pain  Recent CT chest with bilateral lung nodules, is followed by  pulmonary clinic  Given her persistent complaint of abdominal pain, will schedule CT abdomen pelvis with contrast to exclude any mass lesion/intestinal TB/sarcoidosis/IBD  Discussed lactose-free diet with patient in detail, she is reluctant to avoid lactose.  Explained to her that abdominal pain/discomfort and diarrhea episodes are triggered whenever she consumes large amounts of milk or dairy products, hence going forward she will need to avoid/limit milk products.  Follow-up as needed  25 minutes was spent face-to-face with the patient. Greater than 50% of the time used for counseling as well as treatment plan and follow-up. She had multiple questions which were answered to her satisfaction  K. Denzil Magnuson , MD    CC: [facility]

## 2018-11-13 NOTE — Patient Instructions (Addendum)
You have been scheduled for a CT scan of the abdomen and pelvis at [facility] ([address]---this is in the same building as [employer]).   You are scheduled on 12/03/2018 at 1:00 . You should arrive 15 minutes prior to your appointment time for registration. Please follow the written instructions below on the day of your exam:  WARNING: IF YOU ARE ALLERGIC TO IODINE/X-RAY DYE, PLEASE NOTIFY RADIOLOGY IMMEDIATELY AT [phone]! YOU WILL BE GIVEN A 13 HOUR PREMEDICATION PREP.  1) Do not eat or drink anything after 9am (4 hours prior to your test) 2) You have been given 2 bottles of oral contrast to drink. The solution may taste better if refrigerated, but do NOT add ice or any other liquid to this solution. Shake well before drinking.    Drink 1 bottle of contrast @ 11:00am (2 hours prior to your exam)  Drink 1 bottle of contrast @ 12:00pm (1 hour prior to your exam)  You may take any medications as prescribed with a small amount of water, if necessary. If you take any of the following medications: METFORMIN, GLUCOPHAGE, GLUCOVANCE, AVANDAMET, RIOMET, FORTAMET, [place] MET, JANUMET, GLUMETZA or METAGLIP, you MAY be asked to HOLD this medication 48 hours AFTER the exam.  The purpose of you drinking the oral contrast is to aid in the visualization of your intestinal tract. The contrast solution may cause some diarrhea. Depending on your individual set of symptoms, you may also receive an intravenous injection of x-ray contrast/dye. Plan on being at [facility] for 30 minutes or longer, depending on the type of exam you are having performed.  This test typically takes 30-45 minutes to complete.  If you have any questions regarding your exam or if you need to reschedule, you may call the CT department at [phone] between the hours of 8:00 am and 5:00 pm, Monday-Friday.  ________________________________________________________________________  Lactose-Free  Diet, Adult If you have lactose intolerance, you are not able to digest lactose. Lactose is a natural sugar found mainly in dairy milk and dairy products. You may need to avoid all foods and beverages that contain lactose. A lactose-free diet can help you do this. Which foods have lactose? Lactose is found in dairy milk and dairy products, such as:  Yogurt.  Cheese.  Butter.  Margarine.  Sour cream.  Cream.  Whipped toppings and nondairy creamers.  Ice cream and other dairy-based desserts. Lactose is also found in foods or products made with dairy milk or milk ingredients. To find out whether a food contains dairy milk or a milk ingredient, look at the ingredients list. Avoid foods with the statement "May contain milk" and foods that contain:  Milk powder.  Whey.  Curd.  Caseinate.  Lactose.  Lactalbumin.  Lactoglobulin. What are alternatives to dairy milk and foods made with milk products?  Lactose-free milk.  Soy milk with added calcium and vitamin D.  Almond milk, coconut milk, rice milk, or other nondairy milk alternatives with added calcium and vitamin D. Note that these are low in protein.  Soy products, such as soy yogurt, soy cheese, soy ice cream, and soy-based sour cream.  Other nut milk products, such as almond yogurt, almond cheese, cashew yogurt, cashew cheese, cashew ice cream, coconut yogurt, and coconut ice cream. What are tips for following this plan?  Do not consume foods, beverages, vitamins, minerals, or medicines containing lactose. Read ingredient lists carefully.  Look for the words "lactose-free" on labels.  Use lactase  enzyme drops or tablets as directed by your health care provider.  Use lactose-free milk or a milk alternative, such as soy milk or almond milk, for drinking and cooking.  Make sure you get enough calcium and vitamin D in your diet. A lactose-free eating plan can be lacking in these important nutrients.  Take calcium  and vitamin D supplements as directed by your health care provider. Talk to your health care provider about supplements if you are not able to get enough calcium and vitamin D from food. What foods can I eat?  Fruits All fresh, canned, frozen, or dried fruits that are not processed with lactose. Vegetables All fresh, frozen, and canned vegetables without cheese, cream, or butter sauces. Grains Any that are not made with dairy milk or dairy products. Meats and other proteins Any meat, fish, poultry, and other protein sources that are not made with dairy milk or dairy products. Soy cheese and yogurt. Fats and oils Any that are not made with dairy milk or dairy products. Beverages Lactose-free milk. Soy, rice, or almond milk with added calcium and vitamin D. Fruit and vegetable juices. Sweets and desserts Any that are not made with dairy milk or dairy products. Seasonings and condiments Any that are not made with dairy milk or dairy products. Calcium Calcium is found in many foods that contain lactose and is important for bone health. The amount of calcium you need depends on your age:  Adults younger than 50 years: 1,000 mg of calcium a day.  Adults older than 50 years: 1,200 mg of calcium a day. If you are not getting enough calcium, you may get it from other sources, including:  Orange juice with calcium added. There are 300-350 mg of calcium in 1 cup of orange juice.  Calcium-fortified soy milk. There are 300-400 mg of calcium in 1 cup of calcium-fortified soy milk.  Calcium-fortified rice or almond milk. There are 300 mg of calcium in 1 cup of calcium-fortified rice or almond milk.  Calcium-fortified breakfast cereals. There are 100-1,000 mg of calcium in calcium-fortified breakfast cereals.  Spinach, cooked. There are 145 mg of calcium in  cup of cooked spinach.  Edamame, cooked. There are 130 mg of calcium in  cup of cooked edamame.  Collard greens, cooked. There are 125  mg of calcium in  cup of cooked collard greens.  Kale, frozen or cooked. There are 90 mg of calcium in  cup of cooked or frozen kale.  Almonds. There are 95 mg of calcium in  cup of almonds.  Broccoli, cooked. There are 60 mg of calcium in 1 cup of cooked broccoli. The items listed above may not be a complete list of recommended foods and beverages. Contact a dietitian for more options. What foods are not recommended? Fruits None, unless they are made with dairy milk or dairy products. Vegetables None, unless they are made with dairy milk or dairy products. Grains Any grains that are made with dairy milk or dairy products. Meats and other proteins None, unless they are made with dairy milk or dairy products. Dairy All dairy products, including milk, goat's milk, buttermilk, kefir, acidophilus milk, flavored milk, evaporated milk, condensed milk, dulce de [place], eggnog, yogurt, cheese, and cheese spreads. Fats and oils Any that are made with milk or milk products. Margarines and salad dressings that contain milk or cheese. Cream. Half and half. Cream cheese. Sour cream. Chip dips made with sour cream or yogurt. Beverages Hot chocolate. Cocoa with lactose. Instant  iced teas. Powdered fruit drinks. Smoothies made with dairy milk or yogurt. Sweets and desserts Any that are made with milk or milk products. Seasonings and condiments Chewing gum that has lactose. Spice blends if they contain lactose. Artificial sweeteners that contain lactose. Nondairy creamers. The items listed above may not be a complete list of foods and beverages to avoid. Contact a dietitian for more information. Summary  If you are lactose intolerant, it means that you have a hard time digesting lactose, a natural sugar found in milk and milk products.  Following a lactose-free diet can help you manage this condition.  Calcium is important for bone health and is found in many foods that contain lactose. Talk with  your health care provider about other sources of calcium. This information is not intended to replace advice given to you by your health care provider. Make sure you discuss any questions you have with your health care provider. Document Released: 07/09/2001 Document Revised: 02/14/2017 Document Reviewed: 02/14/2017 Elsevier Patient Education  [place].  I appreciate the  opportunity to care for you  Thank You   Harl Bowie , MD

## 2018-11-15 ENCOUNTER — Other Ambulatory Visit: Payer: Self-pay

## 2018-11-15 ENCOUNTER — Encounter: Payer: Self-pay | Admitting: Pulmonary Disease

## 2018-11-15 ENCOUNTER — Telehealth: Payer: Self-pay | Admitting: *Deleted

## 2018-11-15 ENCOUNTER — Ambulatory Visit: Payer: Medicaid Other | Admitting: Pulmonary Disease

## 2018-11-15 VITALS — BP 118/70 | HR 80 | Temp 97.6°F | Ht 62.0 in | Wt 134.6 lb

## 2018-11-15 DIAGNOSIS — F1220 Cannabis dependence, uncomplicated: Secondary | ICD-10-CM | POA: Diagnosis not present

## 2018-11-15 DIAGNOSIS — Z23 Encounter for immunization: Secondary | ICD-10-CM

## 2018-11-15 DIAGNOSIS — R918 Other nonspecific abnormal finding of lung field: Secondary | ICD-10-CM

## 2018-11-15 DIAGNOSIS — R1084 Generalized abdominal pain: Secondary | ICD-10-CM

## 2018-11-15 DIAGNOSIS — J4540 Moderate persistent asthma, uncomplicated: Secondary | ICD-10-CM

## 2018-11-15 DIAGNOSIS — J3081 Allergic rhinitis due to animal (cat) (dog) hair and dander: Secondary | ICD-10-CM

## 2018-11-15 MED ORDER — BREO ELLIPTA 100-25 MCG/INH IN AEPB: 1.0000 | INHALATION_SPRAY | Freq: Every day | RESPIRATORY_TRACT | 5 refills | Status: DC

## 2018-11-15 NOTE — Progress Notes (Signed)
[facility]  Chief Complaint  Patient presents with  . Asthma    Was seen in hospital on 11/01/18. Using medications as prescribed, but still having a productive cough.     Constitutional:  BP 118/70 (BP Location: Right Arm, Patient Position: Sitting, Cuff Size: Normal)   Pulse 80   Temp 97.6 F (36.4 C)   Ht 5\' 2"  (1.575 m)   Wt 134 lb 9.6 oz (61.1 kg)   LMP 11/11/2018 (Approximate)   SpO2 99% Comment: on room air  BMI 24.62 kg/m   Past Medical History:  Pneumonia, Allergies  Brief Summary:  Catherine Osborne is a 43 y.o. female former smoker with asthma and lung nodule.  She had another episode of bronchitis recently.  No smoking cigarettes, but smokes THC several times per day.  Now has cough with clear sputum.  Not having wheeze.  Likes breo better, but wasn't covered by insurance before.  Has been using symbicort and prn albuterol.  No having fever, chest pain, sinus congestion.  Mild sore throat.  No skin rash, joint swelling, or leg swelling.   Physical Exam:   Appearance - well kempt   ENMT - clear nasal mucosa, midline nasal  septum, no oral exudates, no LAN, trachea midline  Respiratory - normal chest wall, normal respiratory effort, no accessory muscle use, no wheeze/rales  CV - s1s2 regular rate and rhythm, no murmurs, no peripheral edema, radial pulses symmetric  GI - soft, non tender, no masses  Lymph - no adenopathy noted in neck and axillary areas  MSK - normal gait  Ext - no cyanosis, clubbing, or joint inflammation noted  Skin - no rashes, lesions, or ulcers  Neuro - normal strength, oriented x 3  Psych - normal mood and affect   Assessment/Plan:   Allergic asthma. - will try changing her to breo in place of symbicort - prn albuterol - discussed roles of her different inhalers - flu shot today  Allergic rhinitis. - prn flonase  Lung nodules. - reviewed her CT chest myself - follow up CT chest  scheduled for 01/04/19  Marijuana dependence. - reviewed adverse lung consequences from [facility] use and strongly advised her to quit   Patient Instructions  Flu shot today  Follow up in December 2020 after you have CT chest done    Chesley Mires, MD [place] Pager: [phone] 11/15/2018, 10:25 AM  Flow Sheet     Pulmonary tests:  Spirometry 11/04/13 >> FEV1 1.86 (76%), FEV1% 64 Allergy test 11/04/13 >> dust mite, cockroach, dogs, trees  Chest imaging:  CT chest 10/05/18 >> b/l irregular nodules up to 2.3 cm  Medications:   Allergies as of 11/15/2018   No Known Allergies     Medication List       Accurate as of November 15, 2018 10:25 AM. If you have any questions, ask your nurse or doctor.        STOP taking these medications   benzonatate 100 MG capsule Commonly known as: TESSALON Stopped by: Chesley Mires, MD   budesonide-formoterol 80-4.5 MCG/ACT inhaler Commonly known as: Symbicort Stopped by: Chesley Mires, MD   predniSONE 10 MG (21) Tbpk tablet Commonly known as: STERAPRED UNI-PAK 21 TAB Stopped by: Chesley Mires, MD     TAKE these medications   albuterol (2.5 MG/3ML) 0.083% nebulizer solution Commonly known as: PROVENTIL Take 3 mLs (2.5 mg total) by nebulization every 6 (six) hours as needed for wheezing or shortness  of breath.   albuterol 108 (90 Base) MCG/ACT inhaler Commonly known as: VENTOLIN HFA Inhale 1-2 puffs into the lungs every 6 (six) hours as needed for wheezing or shortness of breath.   Breo Ellipta 100-25 MCG/INH Aepb Generic drug: fluticasone furoate-vilanterol Inhale 1 puff into the lungs daily.   omeprazole 20 MG capsule Commonly known as: PRILOSEC Take 1 capsule (20 mg total) by mouth daily for 14 days.   polyethylene glycol 17 g packet Commonly known as: MIRALAX / GLYCOLAX Take 17 g by mouth daily as needed for moderate constipation.   sertraline 100 MG tablet Commonly known as: ZOLOFT Take 100 mg by  mouth daily.   tinidazole 500 MG tablet Commonly known as: Tindamax Take 2 tablets (1,000 mg total) by mouth daily with breakfast.       Past Surgical History:  She  has a past surgical history that includes Foot surgery; Dilation and evacuation (N/A, 05/22/2012); Cervical cerclage (N/A, 02/26/2015); and Dilation and curettage of uterus.  Family History:  Her family history includes Bone cancer in her mother; Breast cancer in her maternal grandmother; Diabetes in an other family member; Heart attack in an other family member; Hypertension in an other family member; Stroke in an other family member. She was adopted.  Social History:  She  reports that she quit smoking about 9 months ago. Her smoking use included cigarettes. She has a 15.00 pack-year smoking history. She has never used smokeless tobacco. She reports previous alcohol use of about 1.0 standard drinks of alcohol per week. She reports current drug use. Frequency: 210.00 times per week. Drug: Marijuana.

## 2018-11-15 NOTE — Telephone Encounter (Signed)
Patient has been scheduled for a CT scan abdomen/Pelvis at [place] on 11/2 at 1pm, pt has contrast and instructions but iv been unable to reach her with the date and time   Phone rings then goes to a busy signal

## 2018-11-15 NOTE — Patient Instructions (Signed)
Flu shot today  Follow up in December 2020 after you have CT chest done

## 2018-11-16 ENCOUNTER — Encounter: Payer: Self-pay | Admitting: Podiatry

## 2018-11-16 ENCOUNTER — Ambulatory Visit: Payer: Medicaid Other | Admitting: Podiatry

## 2018-11-16 ENCOUNTER — Ambulatory Visit: Payer: Medicaid Other | Attending: Family Medicine | Admitting: Family Medicine

## 2018-11-16 ENCOUNTER — Other Ambulatory Visit: Payer: Self-pay

## 2018-11-16 ENCOUNTER — Encounter: Payer: Self-pay | Admitting: Family Medicine

## 2018-11-16 VITALS — BP 126/82 | HR 75 | Temp 98.4°F | Ht 62.0 in | Wt 135.4 lb

## 2018-11-16 DIAGNOSIS — R202 Paresthesia of skin: Secondary | ICD-10-CM

## 2018-11-16 DIAGNOSIS — R5383 Other fatigue: Secondary | ICD-10-CM

## 2018-11-16 DIAGNOSIS — E041 Nontoxic single thyroid nodule: Secondary | ICD-10-CM

## 2018-11-16 DIAGNOSIS — K219 Gastro-esophageal reflux disease without esophagitis: Secondary | ICD-10-CM | POA: Diagnosis not present

## 2018-11-16 DIAGNOSIS — R350 Frequency of micturition: Secondary | ICD-10-CM

## 2018-11-16 DIAGNOSIS — G8929 Other chronic pain: Secondary | ICD-10-CM | POA: Diagnosis not present

## 2018-11-16 DIAGNOSIS — Z79899 Other long term (current) drug therapy: Secondary | ICD-10-CM | POA: Insufficient documentation

## 2018-11-16 DIAGNOSIS — E559 Vitamin D deficiency, unspecified: Secondary | ICD-10-CM

## 2018-11-16 DIAGNOSIS — R200 Anesthesia of skin: Secondary | ICD-10-CM | POA: Insufficient documentation

## 2018-11-16 DIAGNOSIS — J45909 Unspecified asthma, uncomplicated: Secondary | ICD-10-CM | POA: Insufficient documentation

## 2018-11-16 DIAGNOSIS — M216X2 Other acquired deformities of left foot: Secondary | ICD-10-CM | POA: Diagnosis not present

## 2018-11-16 DIAGNOSIS — Z87891 Personal history of nicotine dependence: Secondary | ICD-10-CM | POA: Insufficient documentation

## 2018-11-16 DIAGNOSIS — M2550 Pain in unspecified joint: Secondary | ICD-10-CM | POA: Diagnosis not present

## 2018-11-16 DIAGNOSIS — F329 Major depressive disorder, single episode, unspecified: Secondary | ICD-10-CM | POA: Diagnosis not present

## 2018-11-16 DIAGNOSIS — L851 Acquired keratosis [keratoderma] palmaris et plantaris: Secondary | ICD-10-CM

## 2018-11-16 DIAGNOSIS — M79672 Pain in left foot: Secondary | ICD-10-CM

## 2018-11-16 DIAGNOSIS — Z8632 Personal history of gestational diabetes: Secondary | ICD-10-CM

## 2018-11-16 NOTE — Patient Instructions (Signed)

## 2018-11-16 NOTE — Progress Notes (Signed)
Est care

## 2018-11-16 NOTE — Patient Instructions (Signed)

## 2018-11-16 NOTE — Progress Notes (Signed)
Subjective:  [name], female    DOB: 26-Feb-1975  Age: 43 y.o. MRN: [id number]  CC: No chief complaint on file.   HPI Catherine Osborne presents for complaint of nto feeling well in general and no one can figure out what is going in with her body. History of gestational DM 3 yeara ago when she had her son, history of thyroid nodules s/p FNA biopsy last year. Recurrent joint pain and numbness tingling in hands/feet and muscle cramping.  Feels as though she has areas of numbness in her body.  Increased thirst and urinary freq. History of low Vitam D. Reflux. History of depression-counseling for years but not currently receiving any counseling.  Past Medical History:  Diagnosis Date  . Allergy   . Asthma   . Headache   . 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 Pneumonia   . Prior miscarriage with pregnancy in first trimester, antepartum   . Vaginal Pap smear, abnormal     Past Surgical History:  Procedure Laterality Date  . CERVICAL CERCLAGE N/A 02/26/2015   Procedure: CERCLAGE CERVICAL;  Surgeon: Frederico Hamman, MD;  Location: [facility];  Service: Gynecology;  Laterality: N/A;  . DILATION AND CURETTAGE OF UTERUS    . DILATION AND EVACUATION N/A 05/22/2012   Procedure: DILATATION AND EVACUATION;  Surgeon: Frederico Hamman, MD;  Location: [facility];  Service: Gynecology;  Laterality: N/A;  . FOOT SURGERY      Family History  Adopted: Yes  Problem Relation Age of Onset  . Bone cancer Mother   . Breast cancer Maternal Grandmother   . Diabetes Other   . Hypertension Other   . Stroke Other   . Heart attack Other     Social History   Tobacco Use  . Smoking status: Former Smoker    Packs/day: 0.75    Years: 20.00    Pack years: 15.00    Types: Cigarettes    Quit date: 01/31/2018    Years since quitting: 0.7  . Smokeless tobacco: Never Used  . Tobacco comment: marijuana trying to decrease from 20 blunts day to 5 now  Substance Use Topics  . Alcohol use: Not  Currently    Alcohol/week: 1.0 standard drinks    Types: 1 Glasses of wine per week    Comment: rare - no liquor since april 2020/ drinks wine     ROS Review of Systems  Constitutional: Positive for fatigue. Negative for chills and fever.  HENT: Negative for sore throat and trouble swallowing.   Respiratory: Negative for cough and shortness of breath.   Cardiovascular: Negative for chest pain and palpitations.  Gastrointestinal: Negative for abdominal pain, constipation, diarrhea and nausea.  Endocrine: Positive for polyuria. Negative for polydipsia and polyphagia.  Genitourinary: Positive for frequency. Negative for dysuria.  Musculoskeletal: Positive for arthralgias and myalgias.  Neurological: Positive for numbness. Negative for dizziness and headaches.  Hematological: Negative for adenopathy. Does not bruise/bleed easily.    Objective:   Today's Vitals: BP 126/82 (BP Location: Right Arm, Patient Position: Sitting, Cuff Size: Normal)   Pulse 75   Temp 98.4 F (36.9 C) (Oral)   Ht 5\' 2"  (1.575 m)   Wt 135 lb 6.4 oz (61.4 kg)   LMP 11/11/2018 (Approximate)   SpO2 97%   BMI 24.76 kg/m   Physical Exam Vitals and nursing note reviewed.  Constitutional:      Appearance: Normal appearance.  Cardiovascular:     Rate and Rhythm:  Normal rate and regular rhythm.  Pulmonary:     Effort: Pulmonary effort is normal.     Breath sounds: Normal breath sounds.  Abdominal:     Palpations: Abdomen is soft.     Tenderness: There is no abdominal tenderness. There is no right CVA tenderness, left CVA tenderness, guarding or rebound.  Musculoskeletal:        General: No tenderness or deformity.     Cervical back: Normal range of motion. No tenderness.     Right lower leg: No edema.     Left lower leg: No edema.  Lymphadenopathy:     Cervical: No cervical adenopathy.  Skin:    General: Skin is warm and dry.  Neurological:     General: No focal deficit present.     Mental Status: She  is alert and oriented to person, place, and time.  Psychiatric:        Mood and Affect: Mood normal.        Behavior: Behavior normal.     Assessment & Plan:  1. Fatigue, unspecified type She reports ongoing issues fatigue for which she will have blood work at today's visit.  She reports history of vitamin D deficiency for which she will have vitamin D level done at today's visit.  She will have thyroid blood work due to her history of thyroid nodules as well as her complaint of fatigue and paresthesias.  Comprehensive metabolic panel and hemoglobin A1c in follow-up of history of prediabetes and patient has complaint of paresthesias and urinary frequency.  Urinalysis to look for possible urinary tract infection, CBC to look for anemia or other blood disorder which may be contributing to her fatigue.  Arthritis panel due to her fatigue and joint pain as well as vitamin B12 level to look for B12 deficiency related to fatigue and paresthesias. - T4 AND TSH - Comprehensive metabolic panel - Hemoglobin A1c - CBC with Differential - Vitamin B12 - Vitamin D, 25-hydroxy - Arthritis Panel  2. Paresthesias She has complaints of paresthesia/numbness which come and go.  Will check thyroid blood work, vitamin B12 level and vitamin D level to make sure that there are no issues with thyroid disorder, vitamin B12 deficiency or vitamin D deficiency which are causing patient symptoms.  She will additionally have hemoglobin A1c to look for prediabetes or diabetes as a cause of her symptoms. - T4 AND TSH - Vitamin B12 - Vitamin D, 25-hydroxy  3. Thyroid nodule She reports that she has had prior removal of thyroid nodule and wonders if additional nodules may be present as she is having issues with fatigue.  Will check T4 and TSH to look for thyroid disorder and patient will be scheduled for thyroid ultrasound to look for any additional nodules or other abnormalities. - T4 AND TSH - US THYROID; Future  4.  History of gestational diabetes She has a history of gestational diabetes and now reports issues with fatigue and urinary frequency.  Will check hemoglobin A1c to see if she may be prediabetic or diabetic.  She will be notified of the results and if further follow-up or treatment are needed based on the results. - Hemoglobin A1c  5. Urinary frequency She reports urinary frequency and will have testing for diabetes and additionally have urinalysis and urine culture to look for possible urinary tract infection.  She will be notified of the results and if further treatment is needed based on results - Urinalysis, Routine w reflex microscopic - Urine Culture  6. Vitamin D deficiency She reports prior vitamin D deficiency as well as issues with fatigue and joint pain.  She will have a vitamin D level done in follow-up and will be notified of prescription vitamin D therapy is needed based on the results - Vitamin D, 25-hydroxy  7. Chronic pain of multiple joints We will check arthritis panel in follow-up of patient's complaint of multiple joint pain and additionally check vitamin D as she reports vaginal D deficiency and low vitamin D levels can sometimes contribute to fatigue and joint/muscle achiness - Arthritis Panel  Outpatient Encounter Medications as of 11/16/2018  Medication Sig  . albuterol (PROVENTIL) (2.5 MG/3ML) 0.083% nebulizer solution Take 3 mLs (2.5 mg total) by nebulization every 6 (six) hours as needed for wheezing or shortness of breath.  Marland Kitchen albuterol (VENTOLIN HFA) 108 (90 Base) MCG/ACT inhaler Inhale 1-2 puffs into the lungs every 6 (six) hours as needed for wheezing or shortness of breath.  Marland Kitchen BREO ELLIPTA 100-25 MCG/INH AEPB Inhale 1 puff into the lungs daily.  . polyethylene glycol (MIRALAX / GLYCOLAX) 17 g packet Take 17 g by mouth daily as needed for moderate constipation.  . sertraline (ZOLOFT) 100 MG tablet Take 100 mg by mouth daily.  Marland Kitchen omeprazole (PRILOSEC) 20 MG capsule  Take 1 capsule (20 mg total) by mouth daily for 14 days. (Patient not taking: Reported on 11/16/2018)  . tinidazole (TINDAMAX) 500 MG tablet Take 2 tablets (1,000 mg total) by mouth daily with breakfast. (Patient not taking: Reported on 11/16/2018)   No facility-administered encounter medications on file as of 11/16/2018.     An After Visit Summary was printed and given to the patient.   Follow-up: Return in about 3 weeks (around 12/07/2018).    Antony Blackbird MD

## 2018-11-17 LAB — CBC WITH DIFFERENTIAL/PLATELET
Basophils Absolute: 0.1 x10E3/uL (ref 0.0–0.2)
Basos: 1 %
EOS (ABSOLUTE): 0.2 x10E3/uL (ref 0.0–0.4)
Eos: 3 %
Hematocrit: 45.7 % (ref 34.0–46.6)
Hemoglobin: 15.4 g/dL (ref 11.1–15.9)
Immature Grans (Abs): 0 x10E3/uL (ref 0.0–0.1)
Immature Granulocytes: 0 %
Lymphocytes Absolute: 2.3 x10E3/uL (ref 0.7–3.1)
Lymphs: 37 %
MCH: 29.2 pg (ref 26.6–33.0)
MCHC: 33.7 g/dL (ref 31.5–35.7)
MCV: 87 fL (ref 79–97)
Monocytes Absolute: 0.6 x10E3/uL (ref 0.1–0.9)
Monocytes: 10 %
Neutrophils Absolute: 3 x10E3/uL (ref 1.4–7.0)
Neutrophils: 49 %
Platelets: 318 x10E3/uL (ref 150–450)
RBC: 5.28 x10E6/uL (ref 3.77–5.28)
RDW: 13.1 % (ref 11.7–15.4)
WBC: 6.2 x10E3/uL (ref 3.4–10.8)

## 2018-11-17 LAB — URINALYSIS, ROUTINE W REFLEX MICROSCOPIC
Bilirubin, UA: NEGATIVE
Glucose, UA: NEGATIVE
Ketones, UA: NEGATIVE
Leukocytes,UA: NEGATIVE
Nitrite, UA: NEGATIVE
Protein,UA: NEGATIVE
RBC, UA: NEGATIVE
Specific Gravity, UA: 1.021 (ref 1.005–1.030)
Urobilinogen, Ur: 0.2 mg/dL (ref 0.2–1.0)
pH, UA: 6 (ref 5.0–7.5)

## 2018-11-17 LAB — HEMOGLOBIN A1C
Est. average glucose Bld gHb Est-mCnc: 103 mg/dL
Hgb A1c MFr Bld: 5.2 % (ref 4.8–5.6)

## 2018-11-17 LAB — COMPREHENSIVE METABOLIC PANEL WITH GFR
ALT: 24 IU/L (ref 0–32)
AST: 22 IU/L (ref 0–40)
Albumin/Globulin Ratio: 1.5 (ref 1.2–2.2)
Albumin: 4.4 g/dL (ref 3.8–4.8)
Alkaline Phosphatase: 70 IU/L (ref 39–117)
BUN/Creatinine Ratio: 15 (ref 9–23)
BUN: 10 mg/dL (ref 6–24)
Bilirubin Total: 0.5 mg/dL (ref 0.0–1.2)
CO2: 23 mmol/L (ref 20–29)
Calcium: 9.7 mg/dL (ref 8.7–10.2)
Chloride: 103 mmol/L (ref 96–106)
Creatinine, Ser: 0.68 mg/dL (ref 0.57–1.00)
GFR calc Af Amer: 126 mL/min/1.73
GFR calc non Af Amer: 109 mL/min/1.73
Globulin, Total: 2.9 g/dL (ref 1.5–4.5)
Glucose: 78 mg/dL (ref 65–99)
Potassium: 3.9 mmol/L (ref 3.5–5.2)
Sodium: 138 mmol/L (ref 134–144)
Total Protein: 7.3 g/dL (ref 6.0–8.5)

## 2018-11-17 LAB — ARTHRITIS PANEL
Anti Nuclear Antibody (ANA): NEGATIVE
Rheumatoid fact SerPl-aCnc: <10 [IU]/mL (ref 0.0–13.9)
Sed Rate: 14 mm/h (ref 0–32)
Uric Acid: 4.2 mg/dL (ref 2.5–7.1)

## 2018-11-17 LAB — T4 AND TSH
T4, Total: 7.8 ug/dL (ref 4.5–12.0)
TSH: 1.49 u[IU]/mL (ref 0.450–4.500)

## 2018-11-17 LAB — VITAMIN B12: Vitamin B-12: 977 pg/mL (ref 232–1245)

## 2018-11-17 LAB — VITAMIN D 25 HYDROXY (VIT D DEFICIENCY, FRACTURES): Vit D, 25-Hydroxy: 9 ng/mL — ABNORMAL LOW (ref 30.0–100.0)

## 2018-11-18 ENCOUNTER — Other Ambulatory Visit (HOSPITAL_BASED_OUTPATIENT_CLINIC_OR_DEPARTMENT_OTHER): Payer: Medicaid Other | Admitting: Family Medicine

## 2018-11-18 DIAGNOSIS — E559 Vitamin D deficiency, unspecified: Secondary | ICD-10-CM

## 2018-11-18 LAB — URINE CULTURE

## 2018-11-18 MED ORDER — VITAMIN D (ERGOCALCIFEROL) 1.25 MG (50000 UNIT) PO CAPS: 50000.0000 [IU] | ORAL_CAPSULE | ORAL | 0 refills | Status: DC

## 2018-11-18 NOTE — Progress Notes (Signed)
[name], female   DOB: 12/23/1975, 43 y.o.   MRN: [id number]   Patient status post visit and labs in follow-up of joint pain and fatigue. Vitamin D level was very low and RX will be sent into her pharmacy for Vitamin D replacement therapy.

## 2018-11-19 ENCOUNTER — Telehealth: Payer: Self-pay

## 2018-11-19 NOTE — Telephone Encounter (Signed)
Pt called and LVM on nurse triage line to receive a call back. Called pt, no answer- LVM for pt to call back.

## 2018-11-22 ENCOUNTER — Other Ambulatory Visit (HOSPITAL_COMMUNITY)
Admission: RE | Admit: 2018-11-22 | Discharge: 2018-11-22 | Disposition: A | Discharge status: Home / Self Care | Payer: Medicaid Other | Source: Ambulatory Visit | Attending: Obstetrics | Admitting: Obstetrics

## 2018-11-22 ENCOUNTER — Other Ambulatory Visit: Payer: Self-pay | Admitting: Family Medicine

## 2018-11-22 ENCOUNTER — Telehealth: Payer: Self-pay

## 2018-11-22 ENCOUNTER — Other Ambulatory Visit (HOSPITAL_BASED_OUTPATIENT_CLINIC_OR_DEPARTMENT_OTHER): Payer: Medicaid Other | Admitting: Family Medicine

## 2018-11-22 ENCOUNTER — Ambulatory Visit: Payer: Medicaid Other | Admitting: Obstetrics

## 2018-11-22 ENCOUNTER — Encounter: Payer: Self-pay | Admitting: Obstetrics

## 2018-11-22 ENCOUNTER — Other Ambulatory Visit: Payer: Self-pay

## 2018-11-22 VITALS — BP 101/64 | HR 81 | Wt 132.0 lb

## 2018-11-22 DIAGNOSIS — N898 Other specified noninflammatory disorders of vagina: Secondary | ICD-10-CM | POA: Insufficient documentation

## 2018-11-22 DIAGNOSIS — R102 Pelvic and perineal pain: Secondary | ICD-10-CM | POA: Diagnosis not present

## 2018-11-22 DIAGNOSIS — N939 Abnormal uterine and vaginal bleeding, unspecified: Secondary | ICD-10-CM | POA: Diagnosis not present

## 2018-11-22 DIAGNOSIS — E559 Vitamin D deficiency, unspecified: Secondary | ICD-10-CM

## 2018-11-22 MED ORDER — VITAMIN D (ERGOCALCIFEROL) 1.25 MG (50000 UNIT) PO CAPS: ORAL_CAPSULE | ORAL | 0 refills | Status: DC

## 2018-11-22 NOTE — Telephone Encounter (Signed)
Catherine Osborne with [facility] called to verify vitamin D prescription. Please follow up.

## 2018-11-22 NOTE — Patient Instructions (Signed)
Endometrial Biopsy  Endometrial biopsy is a procedure in which a tissue sample is taken from inside the uterus. The sample is taken from the endometrium, which is the lining of the uterus. The tissue sample is then checked under a microscope to see if the tissue is normal or abnormal. This procedure helps to determine where you are in your menstrual cycle and how hormone levels are affecting the lining of the uterus. This procedure may also be used to evaluate uterine bleeding or to diagnose endometrial cancer, endometrial tuberculosis, polyps, or other inflammatory conditions. Tell a health care provider about:  Any allergies you have.  All medicines you are taking, including vitamins, herbs, eye drops, creams, and over-the-counter medicines.  Any problems you or family members have had with anesthetic medicines.  Any blood disorders you have.  Any surgeries you have had.  Any medical conditions you have.  Whether you are pregnant or may be pregnant. What are the risks? Generally, this is a safe procedure. However, problems may occur, including:  Bleeding.  Pelvic infection.  Puncture of the wall of the uterus with the biopsy device (rare). What happens before the procedure?  Keep a record of your menstrual cycles as told by your health care provider. You may need to schedule your procedure for a specific time in your cycle.  You may want to bring a sanitary pad to wear after the procedure.  Ask your health care provider about: ? Changing or stopping your regular medicines. This is especially important if you are taking diabetes medicines or blood thinners. ? Taking medicines such as aspirin and ibuprofen. These medicines can thin your blood. Do not take these medicines before your procedure if your health care provider instructs you not to.  Plan to have someone take you home from the hospital or clinic. What happens during the procedure?  To lower your risk of infection: ?  Your health care team will wash or sanitize their hands.  You will lie on an exam table with your feet and legs supported as in a pelvic exam.  Your health care provider will insert an instrument (speculum) into your vagina to see your cervix.  Your cervix will be cleansed with an antiseptic solution.  A medicine (local anesthetic) will be used to numb the cervix.  A forceps instrument (tenaculum) will be used to hold your cervix steady for the biopsy.  A thin, rod-like instrument (uterine sound) will be inserted through your cervix to determine the length of your uterus and the location where the biopsy sample will be removed.  A thin, flexible tube (catheter) will be inserted through your cervix and into the uterus. The catheter will be used to collect the biopsy sample from your endometrial tissue.  The catheter and speculum will then be removed, and the tissue sample will be sent to a lab for examination. What happens after the procedure?  You will rest in a recovery area until you are ready to go home.  You may have mild cramping and a small amount of vaginal bleeding. This is normal.  It is up to you to get the results of your procedure. Ask your health care provider, or the department that is doing the procedure, when your results will be ready. Summary  Endometrial biopsy is a procedure in which a tissue sample is taken from the endometrium, which is the lining of the uterus.  This procedure may help to diagnose menstrual cycle problems, abnormal bleeding, or other conditions affecting   the endometrium.  Before the procedure, keep a record of your menstrual cycles as told by your health care provider.  The tissue sample that is removed will be checked under a microscope to see if it is normal or abnormal. This information is not intended to replace advice given to you by your health care provider. Make sure you discuss any questions you have with your health care provider.  Document Released: 05/20/2004 Document Revised: 12/30/2016 Document Reviewed: 02/03/2016 Elsevier Patient Education  2020 [employer] Inc[employer]  

## 2018-11-22 NOTE — Telephone Encounter (Signed)
New RX sent to pharmacy.

## 2018-11-22 NOTE — Progress Notes (Signed)
[name], female   DOB: 05-03-1975, 43 y.o.   MRN: [id number]  Chief Complaint  Patient presents with  . Gynecologic Exam    spotting for 2 months     HPI Catherine Osborne is a 43 y.o. female.  Vaginal spotting for 2 months.  Also complains of pelvic pain, particularly with intercourse. HPI  Past Medical History:  Diagnosis Date  . Allergy   . Asthma   . Headache   . 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 Pneumonia   . Prior miscarriage with pregnancy in first trimester, antepartum   . Vaginal Pap smear, abnormal     Past Surgical History:  Procedure Laterality Date  . CERVICAL CERCLAGE N/A 02/26/2015   Procedure: CERCLAGE CERVICAL;  Surgeon: Frederico Hamman, MD;  Location: [facility];  Service: Gynecology;  Laterality: N/A;  . DILATION AND CURETTAGE OF UTERUS    . DILATION AND EVACUATION N/A 05/22/2012   Procedure: DILATATION AND EVACUATION;  Surgeon: Frederico Hamman, MD;  Location: [facility];  Service: Gynecology;  Laterality: N/A;  . FOOT SURGERY      Family History  Adopted: Yes  Problem Relation Age of Onset  . Bone cancer Mother   . Breast cancer Maternal Grandmother   . Diabetes Other   . Hypertension Other   . Stroke Other   . Heart attack Other     Social History Social History   Tobacco Use  . Smoking status: Former Smoker    Packs/day: 0.75    Years: 20.00    Pack years: 15.00    Types: Cigarettes    Quit date: 01/31/2018    Years since quitting: 0.8  . Smokeless tobacco: Never Used  . Tobacco comment: marijuana trying to decrease from 20 blunts day to 5 now  Substance Use Topics  . Alcohol use: Not Currently    Alcohol/week: 1.0 standard drinks    Types: 1 Glasses of wine per week    Comment: rare - no liquor since april 2020/ drinks wine   . Drug use: Yes    Frequency: 210.0 times per week    Types: Marijuana    Comment: 20 blunts a day now to 5 a day - trying to quit    No Known Allergies  Current Outpatient Medications   Medication Sig Dispense Refill  . albuterol (PROVENTIL) (2.5 MG/3ML) 0.083% nebulizer solution Take 3 mLs (2.5 mg total) by nebulization every 6 (six) hours as needed for wheezing or shortness of breath. 150 mL 3  . albuterol (VENTOLIN HFA) 108 (90 Base) MCG/ACT inhaler Inhale 1-2 puffs into the lungs every 6 (six) hours as needed for wheezing or shortness of breath. 1 g 3  . BREO ELLIPTA 100-25 MCG/INH AEPB Inhale 1 puff into the lungs daily. 28 each 5  . omeprazole (PRILOSEC) 20 MG capsule Take 1 capsule (20 mg total) by mouth daily for 14 days. (Patient not taking: Reported on 11/16/2018) 14 capsule 0  . polyethylene glycol (MIRALAX / GLYCOLAX) 17 g packet Take 17 g by mouth daily as needed for moderate constipation. 14 each 0  . predniSONE (DELTASONE) 10 MG tablet TK 6 TS PO FOR 1 DAY. THEN DECREASE BY ONE TABLET QD UNTIL GONE.    Marland Kitchen sertraline (ZOLOFT) 100 MG tablet Take 100 mg by mouth daily.    Marland Kitchen tinidazole (TINDAMAX) 500 MG tablet Take 2 tablets (1,000 mg total) by mouth daily with breakfast. 10 tablet 2  . Vitamin D,  Ergocalciferol, (DRISDOL) 1.25 MG (50000 UT) CAPS capsule Take 1 capsule (50,000 Units total) by mouth every 7 (seven) days. One capsule by mouth twice per week for 12 weeks 24 capsule 0   No current facility-administered medications for this visit.     Review of Systems Review of Systems Constitutional: negative for fatigue and weight loss Respiratory: negative for cough and wheezing Cardiovascular: negative for chest pain, fatigue and palpitations Gastrointestinal: negative for abdominal pain and change in bowel habits Genitourinary: positive for spotting for 2 months and pelvic pain Integument/breast: negative for nipple discharge Musculoskeletal:negative for myalgias Neurological: negative for gait problems and tremors Behavioral/Psych: negative for abusive relationship, depression Endocrine: negative for temperature intolerance      Blood pressure 101/64, pulse  81, weight 132 lb (59.9 kg), last menstrual period 11/11/2018.  Physical Exam Physical Exam           General: Alert and no distress Abdomen:  normal findings: no organomegaly, soft, non-tender and no hernia  Pelvis:  External genitalia: normal general appearance Urinary system: urethral meatus normal and bladder without fullness, nontender Vaginal: normal without tenderness, induration or masses Cervix: normal appearance Adnexa: normal bimanual exam Uterus: anteverted and non-tender, normal size    50% of 15 min visit spent on counseling and coordination of care.   Data Reviewed Labs  Assessment     1. Abnormal uterine bleeding (AUB) - has CT of abdomen / pelvis scheduled  2. Pelvic pain - same as above  3. Vaginal discharge Rx: - Cervicovaginal ancillary only( [place] [facility])    Plan   Follow up I 2 weeks for endometrial biopsy   No orders of the defined types were placed in this encounter.  No orders of the defined types were placed in this encounter.   Shelly Bombard, MD 11/22/2018 3:12 PM

## 2018-11-22 NOTE — Telephone Encounter (Signed)
Pharmacy calling to clarify patient Vitamin D. Spoke with pharmacy and they stated there are 2 SIGs on the same script that do not match. One says take 1 tablet once a week and the other SIG says take 1 tablet twice a week.   Pharmacy would like a new script sent back to them with correct SIG if possible.

## 2018-11-22 NOTE — Progress Notes (Signed)
Subjective: RILY MYERS presents to clinic with cc of painful porokeratotic lesion left foot which is aggravated when weightbearing with and without shoe gear.  This pain limits her daily activities. Pain symptoms resolve with periodic professional debridement.  Current Outpatient Medications on File Prior to Visit  Medication Sig Dispense Refill  . albuterol (PROVENTIL) (2.5 MG/3ML) 0.083% nebulizer solution Take 3 mLs (2.5 mg total) by nebulization every 6 (six) hours as needed for wheezing or shortness of breath. 150 mL 3  . albuterol (VENTOLIN HFA) 108 (90 Base) MCG/ACT inhaler Inhale 1-2 puffs into the lungs every 6 (six) hours as needed for wheezing or shortness of breath. 1 g 3  . BREO ELLIPTA 100-25 MCG/INH AEPB Inhale 1 puff into the lungs daily. 28 each 5  . polyethylene glycol (MIRALAX / GLYCOLAX) 17 g packet Take 17 g by mouth daily as needed for moderate constipation. 14 each 0  . predniSONE (DELTASONE) 10 MG tablet TK 6 TS PO FOR 1 DAY. THEN DECREASE BY ONE TABLET QD UNTIL GONE.    Marland Kitchen sertraline (ZOLOFT) 100 MG tablet Take 100 mg by mouth daily.    Marland Kitchen tinidazole (TINDAMAX) 500 MG tablet Take 2 tablets (1,000 mg total) by mouth daily with breakfast. 10 tablet 2  . omeprazole (PRILOSEC) 20 MG capsule Take 1 capsule (20 mg total) by mouth daily for 14 days. (Patient not taking: Reported on 11/16/2018) 14 capsule 0   No current facility-administered medications on file prior to visit.      No Known Allergies   Objective: There were no vitals filed for this visit.  Physical Examination:  Vascular  Examination: Capillary refill time immediate x 10 digits.  Palpable DP/PT pulses b/l.  Digital hair present b/l.  No edema noted b/l.  Skin temperature gradient WNL b/l.  Dermatological Examination: Skin with normal turgor, texture and tone b/l.  No open wounds b/l.  No interdigital macerations noted b/l.  Elongated, thick, discolored brittle toenails with subungual  debris and pain on dorsal palpation of nailbeds 1-5 b/l.  Porokeratotic lesions submet head 5 left foot with tenderness to palpation. No erythema, no edema, no drainage, no flocculence.   Musculoskeletal Examination: Muscle strength 5/5 to all muscle groups b/l.  No pain, crepitus or joint discomfort with active/passive ROM.  Neurological Examination: Sensation intact 5/5 b/l with 10 gram monofilament.  Vibratory sensation intact b/l.  Proprioceptive sensation intact b/l.  Assessment: 1. Painful porokeratotic lesion submet head 5 left foot  Plan: 1. Offloaded her shoe insert with aperture pad.  2. Continue soft, supportive shoe gear daily. 3. Report any pedal injuries to medical professional. 4. Follow up 6 weeks for re-evaluation. 5. Patient/POA to call should there be a question/concern in there interim.

## 2018-11-22 NOTE — Progress Notes (Signed)
[name], female   DOB: [birth year], 43 y.o.   MRN: [id number]   Pharmacy requesting new RX for Vitamin D

## 2018-11-23 NOTE — Telephone Encounter (Signed)
noted 

## 2018-11-26 ENCOUNTER — Telehealth: Payer: Self-pay | Admitting: Pulmonary Disease

## 2018-11-26 NOTE — Telephone Encounter (Signed)
LMTCB x1 for pt.  

## 2018-11-29 ENCOUNTER — Other Ambulatory Visit: Payer: Self-pay | Admitting: Obstetrics

## 2018-11-29 DIAGNOSIS — B3731 Acute candidiasis of vulva and vagina: Secondary | ICD-10-CM

## 2018-11-29 DIAGNOSIS — B373 Candidiasis of vulva and vagina: Secondary | ICD-10-CM

## 2018-11-29 LAB — CERVICOVAGINAL ANCILLARY ONLY
Bacterial Vaginitis (gardnerella): POSITIVE — AB
Candida Glabrata: NEGATIVE
Candida Vaginitis: POSITIVE — AB
Chlamydia: NEGATIVE
Comment: NEGATIVE
Comment: NEGATIVE
Comment: NEGATIVE
Comment: NEGATIVE
Comment: NEGATIVE
Comment: NORMAL
Neisseria Gonorrhea: NEGATIVE
Trichomonas: NEGATIVE

## 2018-11-29 MED ORDER — FLUCONAZOLE 150 MG PO TABS: 150.0000 mg | ORAL_TABLET | Freq: Once | ORAL | 0 refills | Status: AC

## 2018-12-03 ENCOUNTER — Inpatient Hospital Stay: Admission: RE | Admit: 2018-12-03 | Payer: Medicaid Other | Source: Ambulatory Visit

## 2018-12-03 NOTE — Telephone Encounter (Signed)
LMTCB x2 for pt 

## 2018-12-04 NOTE — Telephone Encounter (Signed)
I called pt but there was no answer. Will close encounter due to triage protocol of attempts to reach pt without success X 3.

## 2018-12-10 ENCOUNTER — Other Ambulatory Visit: Payer: Medicaid Other | Admitting: Obstetrics

## 2018-12-11 ENCOUNTER — Ambulatory Visit: Payer: Medicaid Other | Admitting: Pulmonary Disease

## 2018-12-11 ENCOUNTER — Other Ambulatory Visit: Payer: Self-pay

## 2018-12-11 ENCOUNTER — Ambulatory Visit (HOSPITAL_COMMUNITY)
Admission: RE | Admit: 2018-12-11 | Discharge: 2018-12-11 | Disposition: A | Discharge status: Home / Self Care | Payer: Medicaid Other | Source: Ambulatory Visit | Attending: Obstetrics | Admitting: Obstetrics

## 2018-12-11 DIAGNOSIS — N939 Abnormal uterine and vaginal bleeding, unspecified: Secondary | ICD-10-CM | POA: Diagnosis present

## 2018-12-11 DIAGNOSIS — R102 Pelvic and perineal pain: Secondary | ICD-10-CM | POA: Insufficient documentation

## 2018-12-12 ENCOUNTER — Ambulatory Visit: Payer: Medicaid Other | Admitting: Obstetrics

## 2018-12-12 ENCOUNTER — Other Ambulatory Visit: Payer: Self-pay

## 2018-12-12 ENCOUNTER — Encounter: Payer: Self-pay | Admitting: Obstetrics

## 2018-12-12 ENCOUNTER — Other Ambulatory Visit (HOSPITAL_COMMUNITY)
Admission: RE | Admit: 2018-12-12 | Discharge: 2018-12-12 | Disposition: A | Discharge status: Home / Self Care | Payer: Medicaid Other | Source: Ambulatory Visit | Attending: Obstetrics | Admitting: Obstetrics

## 2018-12-12 VITALS — BP 100/69 | HR 77 | Wt 137.0 lb

## 2018-12-12 DIAGNOSIS — N939 Abnormal uterine and vaginal bleeding, unspecified: Secondary | ICD-10-CM | POA: Diagnosis present

## 2018-12-12 DIAGNOSIS — D251 Intramural leiomyoma of uterus: Secondary | ICD-10-CM

## 2018-12-12 NOTE — Progress Notes (Signed)
Endometrial Biopsy Procedure Note  Pre-operative Diagnosis: AUB  Post-operative Diagnosis: same  Indications: Abnormal Uterine Bleeding.  Irregular spotting for the past 2 months.  Contraception:  None  Procedure Details   Urine pregnancy test was done in office and result was negative.  The risks (including infection, bleeding, pain, and uterine perforation) and benefits of the procedure were explained to the patient and Written informed consent was obtained.    The patient was placed in the dorsal lithotomy position.  Bimanual exam showed the uterus to be in the neutral position.  A Graves' speculum inserted in the vagina, and the cervix prepped with povidone iodine.  Endocervical curettage with a Kevorkian curette was not performed.   A sharp tenaculum was applied to the anterior lip of the cervix for stabilization.  A sterile uterine sound was used to sound the uterus to a depth of 5cm.  A Pipelle endometrial aspirator was used to sample the endometrium.  Sample was sent for pathologic examination.  Condition: Stable  Complications: None  Plan:  The patient was advised to call for any fever or for prolonged or severe pain or bleeding. She was advised to use NSAID as needed for mild to moderate pain. She was advised to avoid vaginal intercourse for 48 hours or until the bleeding has completely stopped.  Attending Physician Documentation: I was present for or participated in the entire procedure, including opening and closing.  Shelly Bombard, MD 12/12/2018 10:46 AM

## 2018-12-12 NOTE — Progress Notes (Signed)
RGYN patient present for endometrial bx due to AUB  UPT:

## 2018-12-13 ENCOUNTER — Ambulatory Visit: Payer: Medicaid Other | Admitting: Family Medicine

## 2018-12-13 LAB — SURGICAL PATHOLOGY

## 2018-12-24 ENCOUNTER — Encounter: Payer: Self-pay | Admitting: Podiatry

## 2018-12-24 ENCOUNTER — Ambulatory Visit: Payer: Medicaid Other | Admitting: Podiatry

## 2018-12-24 ENCOUNTER — Other Ambulatory Visit: Payer: Self-pay

## 2018-12-24 DIAGNOSIS — L905 Scar conditions and fibrosis of skin: Secondary | ICD-10-CM

## 2018-12-24 DIAGNOSIS — D492 Neoplasm of unspecified behavior of bone, soft tissue, and skin: Secondary | ICD-10-CM | POA: Diagnosis not present

## 2018-12-24 NOTE — Patient Instructions (Signed)

## 2018-12-29 NOTE — Progress Notes (Signed)
Subjective: Catherine Osborne presents to clinic with cc of painful scar plantar aspect left foot which is aggravated when weightbearing with and without shoe gear.  This pain limits her daily activities. Pain symptoms resolve with periodic professional debridement.  Medications reviewed in chart.  No Known Allergies   Objective: There were no vitals filed for this visit.  Physical Examination:  Neurovascular status intact and unchanged b/l.  Dermatological Examination: Skin with normal turgor, texture and tone b/l.  No open wounds b/l.  No interdigital macerations noted b/l.  Porokeratotic lesion submet head 5 left foot with tenderness to palpation. No erythema, no edema, no drainage, no flocculence.   Musculoskeletal Examination: Muscle strength 5/5 to all muscle groups b/l.   No pain, crepitus or joint discomfort with active/passive ROM.  Assessment: 1. Painful porokeratotic scar left foot  Plan: 1.  Dispensed felt pads for lesion 2.  Continue soft, supportive shoe gear daily. 3.  Report any pedal injuries to medical professional. 4.  Follow up 3 months. 5.  Patient/POA to call should there be a question/concern in there interim.

## 2018-12-31 ENCOUNTER — Ambulatory Visit: Payer: Medicaid Other | Admitting: Obstetrics

## 2019-01-04 ENCOUNTER — Ambulatory Visit (HOSPITAL_COMMUNITY): Payer: Medicaid Other

## 2019-01-07 ENCOUNTER — Ambulatory Visit: Payer: Medicaid Other | Admitting: Obstetrics

## 2019-01-09 ENCOUNTER — Encounter: Payer: Self-pay | Admitting: Family Medicine

## 2019-01-09 ENCOUNTER — Ambulatory Visit: Payer: Medicaid Other | Attending: Family Medicine | Admitting: Family Medicine

## 2019-01-09 ENCOUNTER — Other Ambulatory Visit: Payer: Self-pay

## 2019-01-09 VITALS — BP 107/75 | HR 79 | Temp 98.2°F | Resp 18 | Ht 62.0 in | Wt 136.0 lb

## 2019-01-09 DIAGNOSIS — Z87891 Personal history of nicotine dependence: Secondary | ICD-10-CM | POA: Diagnosis not present

## 2019-01-09 DIAGNOSIS — Z79899 Other long term (current) drug therapy: Secondary | ICD-10-CM | POA: Diagnosis not present

## 2019-01-09 DIAGNOSIS — J45909 Unspecified asthma, uncomplicated: Secondary | ICD-10-CM | POA: Diagnosis not present

## 2019-01-09 DIAGNOSIS — E559 Vitamin D deficiency, unspecified: Secondary | ICD-10-CM

## 2019-01-09 DIAGNOSIS — R5382 Chronic fatigue, unspecified: Secondary | ICD-10-CM

## 2019-01-09 NOTE — Progress Notes (Signed)
Established Patient Office Visit  Subjective:  [name], female    DOB: [birth year]  Age: 43 y.o. MRN: [id number]  CC:  Chief Complaint  Patient presents with  . Follow-up    HPI Catherine Osborne, 43 year old female with history of asthma, who presents secondary to complaint of continued issues with chronic fatigue.  She feels that her asthma is currently stable and is followed by her pulmonologist.  She did receive notification of blood work from her last visit.  She has started therapy with vitamin D and feels slightly less fatigue.  She denies any issues with snoring or gasping/difficulty breathing when she is asleep.  She denies any issues with morning headaches and does not feel as if she will fall asleep while driving.  She reports however that she just chronically feels tired.  She is having some issues with abnormal menses/abnormal bleeding for which she is being followed by her gynecologist, Dr. Jodi Mourning.  No current abdominal or pelvic pain.  Past Medical History:  Diagnosis Date  . Allergy   . Asthma   . Headache   . 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 Pneumonia   . Prior miscarriage with pregnancy in first trimester, antepartum   . Vaginal Pap smear, abnormal     Past Surgical History:  Procedure Laterality Date  . CERVICAL CERCLAGE N/A 02/26/2015   Procedure: CERCLAGE CERVICAL;  Surgeon: Frederico Hamman, MD;  Location: [facility];  Service: Gynecology;  Laterality: N/A;  . DILATION AND CURETTAGE OF UTERUS    . DILATION AND EVACUATION N/A 05/22/2012   Procedure: DILATATION AND EVACUATION;  Surgeon: Frederico Hamman, MD;  Location: [facility];  Service: Gynecology;  Laterality: N/A;  . FOOT SURGERY      Family History  Adopted: Yes  Problem Relation Age of Onset  . Bone cancer Mother   . Breast cancer Maternal Grandmother   . Diabetes Other   . Hypertension Other   . Stroke Other   . Heart attack Other     Social History   Socioeconomic History    . Marital status: Single    Spouse name: Not on file  . Number of children: Not on file  . Years of education: Not on file  . Highest education level: Not on file  Occupational History  . Not on file  Social Needs  . Financial resource strain: Not on file  . Food insecurity    Worry: Not on file    Inability: Not on file  . Transportation needs    Medical: Not on file    Non-medical: Not on file  Tobacco Use  . Smoking status: Former Smoker    Packs/day: 0.75    Years: 20.00    Pack years: 15.00    Types: Cigarettes    Quit date: 01/31/2018    Years since quitting: 0.9  . Smokeless tobacco: Never Used  . Tobacco comment: marijuana trying to decrease from 20 blunts day to 5 now  Substance and Sexual Activity  . Alcohol use: Not Currently    Alcohol/week: 1.0 standard drinks    Types: 1 Glasses of wine per week    Comment: rare - no liquor since april 2020/ drinks wine   . Drug use: Yes    Frequency: 210.0 times per week    Types: Marijuana    Comment: 20 blunts a day now to 5 a day - trying to quit  . Sexual activity: Yes  Partners: Male    Birth control/protection: None  Lifestyle  . Physical activity    Days per week: Not on file    Minutes per session: Not on file  . Stress: Not on file  Relationships  . Social Herbalist on phone: Not on file    Gets together: Not on file    Attends religious service: Not on file    Active member of club or organization: Not on file    Attends meetings of clubs or organizations: Not on file    Relationship status: Not on file  . Intimate partner violence    Fear of current or ex partner: Not on file    Emotionally abused: Not on file    Physically abused: Not on file    Forced sexual activity: Not on file  Other Topics Concern  . Not on file  Social History Narrative   Lives top of 3 story home; lives with family; right handed; some college; little caffeine - drinks in winter; exercise none    Outpatient  Medications Prior to Visit  Medication Sig Dispense Refill  . albuterol (PROVENTIL) (2.5 MG/3ML) 0.083% nebulizer solution Take 3 mLs (2.5 mg total) by nebulization every 6 (six) hours as needed for wheezing or shortness of breath. 150 mL 3  . albuterol (VENTOLIN HFA) 108 (90 Base) MCG/ACT inhaler Inhale 1-2 puffs into the lungs every 6 (six) hours as needed for wheezing or shortness of breath. 1 g 3  . BREO ELLIPTA 100-25 MCG/INH AEPB Inhale 1 puff into the lungs daily. 28 each 5  . polyethylene glycol (MIRALAX / GLYCOLAX) 17 g packet Take 17 g by mouth daily as needed for moderate constipation. 14 each 0  . sertraline (ZOLOFT) 100 MG tablet Take 100 mg by mouth daily.    Marland Kitchen tinidazole (TINDAMAX) 500 MG tablet Take 2 tablets (1,000 mg total) by mouth daily with breakfast. 10 tablet 2  . Vitamin D, Ergocalciferol, (DRISDOL) 1.25 MG (50000 UT) CAPS capsule One capsule by mouth twice per week 24 capsule 0  . omeprazole (PRILOSEC) 20 MG capsule Take 1 capsule (20 mg total) by mouth daily for 14 days. (Patient not taking: Reported on 11/16/2018) 14 capsule 0  . predniSONE (DELTASONE) 10 MG tablet TK 6 TS PO FOR 1 DAY. THEN DECREASE BY ONE TABLET QD UNTIL GONE.     No facility-administered medications prior to visit.     No Known Allergies  ROS Review of Systems  Constitutional: Positive for fatigue. Negative for chills and fever.  HENT: Negative for sore throat and trouble swallowing.   Eyes: Negative for photophobia and visual disturbance.  Respiratory: Negative for cough and shortness of breath.   Cardiovascular: Negative for chest pain, palpitations and leg swelling.  Gastrointestinal: Negative for abdominal pain, blood in stool, constipation, diarrhea and nausea.  Endocrine: Negative for polydipsia, polyphagia and polyuria.  Genitourinary: Positive for menstrual problem (Followed by GYN). Negative for dysuria and frequency.  Musculoskeletal: Positive for arthralgias and gait problem (Foot  pain follow-up podiatry).  Neurological: Negative for dizziness and headaches.  Hematological: Negative for adenopathy. Does not bruise/bleed easily.  Psychiatric/Behavioral: Negative for self-injury and suicidal ideas.      Objective:    Physical Exam  Constitutional: She is oriented to person, place, and time. She appears well-developed and well-nourished.  Neck: No JVD present. No thyromegaly present.  Cardiovascular: Normal rate and regular rhythm.  Pulmonary/Chest: Effort normal and breath sounds normal. She has no  wheezes.  Abdominal: Soft. There is no abdominal tenderness. There is no rebound and no guarding.  Musculoskeletal:        General: No tenderness or edema.     Cervical back: Normal range of motion and neck supple.     Comments: No CVA tenderness.  Lymphadenopathy:    She has no cervical adenopathy.  Neurological: She is alert and oriented to person, place, and time.  Psychiatric: She has a normal mood and affect. Her behavior is normal.  Nursing note and vitals reviewed.   BP 107/75 (BP Location: Left Arm, Patient Position: Sitting, Cuff Size: Normal)   Pulse 79   Temp 98.2 F (36.8 C) (Oral)   Resp 18   Ht 5\' 2"  (1.575 m)   Wt 136 lb (61.7 kg)   LMP 12/27/2018   SpO2 98%   BMI 24.87 kg/m  Wt Readings from Last 3 Encounters:  01/09/19 136 lb (61.7 kg)  12/12/18 137 lb (62.1 kg)  11/22/18 132 lb (59.9 kg)     There are no preventive care reminders to display for this patient.   Lab Results  Component Value Date   TSH 1.490 11/16/2018   Lab Results  Component Value Date   WBC 6.2 11/16/2018   HGB 15.4 11/16/2018   HCT 45.7 11/16/2018   MCV 87 11/16/2018   PLT 318 11/16/2018   Lab Results  Component Value Date   NA 138 11/16/2018   K 3.9 11/16/2018   CO2 23 11/16/2018   GLUCOSE 78 11/16/2018   BUN 10 11/16/2018   CREATININE 0.68 11/16/2018   BILITOT 0.5 11/16/2018   ALKPHOS 70 11/16/2018   AST 22 11/16/2018   ALT 24 11/16/2018    PROT 7.3 11/16/2018   ALBUMIN 4.4 11/16/2018   CALCIUM 9.7 11/16/2018   ANIONGAP 10 09/28/2018   No results found for: CHOL No results found for: HDL No results found for: LDLCALC No results found for: TRIG No results found for: CHOLHDL Lab Results  Component Value Date   HGBA1C 5.2 11/16/2018      Assessment & Plan:  1. Chronic fatigue Patient with continued complaint of fatigue.  She has had some improvement.  Labs from her October visit were reviewed with the patient again at today's visit and she had normal arthritis panel, normal comprehensive metabolic panel, normal vitamin B12, normal thyroid blood work and normal hemoglobin A1c.  She did have vitamin D deficiency for which prescription vitamin D therapy has been prescribed.  She is encouraged to follow a healthy diet and to try to exercise on a regular basis.  She reports up-to-date mammogram/Pap smear per GYN.  2. Vitamin D deficiency Patient with vitamin D deficiency with vitamin D level of 9.0 on blood work done 11/16/2018 and she is encouraged to complete 12 weeks of vitamin D therapy with recheck of vitamin D level at her next visit.  After completion of prescription vitamin D, please take vitamin D3 at 1000 IUs daily.   An After Visit Summary was printed and given to the patient.   Follow-up: Return in about 3 months (around 04/09/2019) for vit D def/blood work.   Antony Blackbird, MD

## 2019-01-09 NOTE — Patient Instructions (Signed)
Vitamin D Deficiency Vitamin D deficiency is when your body does not have enough vitamin D. Vitamin D is important to your body because:  It helps your body use other minerals.  It helps to keep your bones strong and healthy.  It may help to prevent some diseases.  It helps your heart and other muscles work well. Not getting enough vitamin D can make your bones soft. It can also cause other health problems. What are the causes? This condition may be caused by:  Not eating enough foods that contain vitamin D.  Not getting enough sun.  Having diseases that make it hard for your body to absorb vitamin D.  Having a surgery in which a part of the stomach or a part of the small intestine is removed.  Having kidney disease or liver disease. What increases the risk? You are more likely to get this condition if:  You are older.  You do not spend much time outdoors.  You live in a nursing home.  You have had broken bones.  You have weak or thin bones (osteoporosis).  You have a disease or condition that changes how your body absorbs vitamin D.  You have dark skin.  You take certain medicines.  You are overweight or obese. What are the signs or symptoms?  In mild cases, there may not be any symptoms. If the condition is very bad, symptoms may include: ? Bone pain. ? Muscle pain. ? Falling often. ? Broken bones caused by a minor injury. How is this treated? Treatment may include taking supplements as told by your doctor. Your doctor will tell you what dose is best for you. Supplements may include:  Vitamin D.  Calcium. Follow these instructions at home: Eating and drinking   Eat foods that contain vitamin D, such as: ? Dairy products, cereals, or juices with added vitamin D. Check the label. ? Fish, such as salmon or trout. ? Eggs. ? Oysters. ? Mushrooms. The items listed above may not be a complete list of what you can eat and drink. Contact a dietitian for more  options. General instructions  Take medicines and supplements only as told by your doctor.  Get regular, safe exposure to natural sunlight.  Do not use a tanning bed.  Maintain a healthy weight. Lose weight if needed.  Keep all follow-up visits as told by your doctor. This is important. How is this prevented?  You can get vitamin D by: ? Eating foods that naturally contain vitamin D. ? Eating or drinking products that have vitamin D added to them, such as cereals, juices, and milk. ? Taking vitamin D or a multivitamin that contains vitamin D. ? Being in the sun. Your body makes vitamin D when your skin is exposed to sunlight. Your body changes the sunlight into a form of the vitamin that it can use. Contact a doctor if:  Your symptoms do not go away.  You feel sick to your stomach (nauseous).  You throw up (vomit).  You poop less often than normal, or you have trouble pooping (constipation). Summary  Vitamin D deficiency is when your body does not have enough vitamin D.  Vitamin D helps to keep your bones strong and healthy.  This condition is often treated by taking a supplement.  Your doctor will tell you what dose is best for you. This information is not intended to replace advice given to you by your health care provider. Make sure you discuss any questions you  have with your health care provider. Document Released: 01/06/2011 Document Revised: 09/25/2017 Document Reviewed: 09/25/2017 Elsevier Patient Education  2020 [employer].

## 2019-01-10 ENCOUNTER — Ambulatory Visit: Payer: Medicaid Other | Admitting: Obstetrics

## 2019-01-10 ENCOUNTER — Encounter: Payer: Self-pay | Admitting: Obstetrics

## 2019-01-10 ENCOUNTER — Encounter

## 2019-01-10 VITALS — BP 127/68 | HR 98 | Wt 134.0 lb

## 2019-01-10 DIAGNOSIS — N939 Abnormal uterine and vaginal bleeding, unspecified: Secondary | ICD-10-CM

## 2019-01-10 DIAGNOSIS — N760 Acute vaginitis: Secondary | ICD-10-CM

## 2019-01-10 DIAGNOSIS — B9689 Other specified bacterial agents as the cause of diseases classified elsewhere: Secondary | ICD-10-CM | POA: Diagnosis not present

## 2019-01-10 DIAGNOSIS — N938 Other specified abnormal uterine and vaginal bleeding: Secondary | ICD-10-CM | POA: Diagnosis not present

## 2019-01-10 DIAGNOSIS — B3731 Acute candidiasis of vulva and vagina: Secondary | ICD-10-CM

## 2019-01-10 DIAGNOSIS — B373 Candidiasis of vulva and vagina: Secondary | ICD-10-CM

## 2019-01-10 MED ORDER — METRONIDAZOLE 500 MG PO TABS: 500.0000 mg | ORAL_TABLET | Freq: Two times a day (BID) | ORAL | 2 refills | Status: DC

## 2019-01-10 MED ORDER — FLUCONAZOLE 150 MG PO TABS: 150.0000 mg | ORAL_TABLET | Freq: Once | ORAL | 0 refills | Status: AC

## 2019-01-10 NOTE — Progress Notes (Signed)
[name], female   DOB: 04-28-1975, 43 y.o.   MRN: [id number]  Chief Complaint  Patient presents with   Follow-up    HPI Catherine Osborne is a 43 y.o. female.  Irregular spotting over past 2-3 months.  Presents today for results of Endometrial Biopsy and Ultrasound. HPI  Past Medical History:  Diagnosis Date   Allergy    Asthma    Headache    Lactose intolerance in adult    Pneumonia    Prior miscarriage with pregnancy in first trimester, antepartum    Vaginal Pap smear, abnormal     Past Surgical History:  Procedure Laterality Date   CERVICAL CERCLAGE N/A 02/26/2015   Procedure: CERCLAGE CERVICAL;  Surgeon: Frederico Hamman, MD;  Location: [facility];  Service: Gynecology;  Laterality: N/A;   DILATION AND CURETTAGE OF UTERUS     DILATION AND EVACUATION N/A 05/22/2012   Procedure: DILATATION AND EVACUATION;  Surgeon: Frederico Hamman, MD;  Location: [facility];  Service: Gynecology;  Laterality: N/A;   FOOT SURGERY      Family History  Adopted: Yes  Problem Relation Age of Onset   Bone cancer Mother    Breast cancer Maternal Grandmother    Diabetes Other    Hypertension Other    Stroke Other    Heart attack Other     Social History Social History   Tobacco Use   Smoking status: Former Smoker    Packs/day: 0.75    Years: 20.00    Pack years: 15.00    Types: Cigarettes    Quit date: 01/31/2018    Years since quitting: 0.9   Smokeless tobacco: Never Used   Tobacco comment: marijuana trying to decrease from 20 blunts day to 5 now  Substance Use Topics   Alcohol use: Not Currently    Alcohol/week: 1.0 standard drinks    Types: 1 Glasses of wine per week    Comment: rare - no liquor since april 2020/ drinks wine    Drug use: Yes    Frequency: 210.0 times per week    Types: Marijuana    Comment: 20 blunts a day now to 5 a day - trying to quit    No Known Allergies  Current Outpatient Medications  Medication Sig Dispense  Refill   albuterol (PROVENTIL) (2.5 MG/3ML) 0.083% nebulizer solution Take 3 mLs (2.5 mg total) by nebulization every 6 (six) hours as needed for wheezing or shortness of breath. 150 mL 3   albuterol (VENTOLIN HFA) 108 (90 Base) MCG/ACT inhaler Inhale 1-2 puffs into the lungs every 6 (six) hours as needed for wheezing or shortness of breath. 1 g 3   BREO ELLIPTA 100-25 MCG/INH AEPB Inhale 1 puff into the lungs daily. 28 each 5   fluconazole (DIFLUCAN) 150 MG tablet Take 1 tablet (150 mg total) by mouth once for 1 dose. 1 tablet 0   metroNIDAZOLE (FLAGYL) 500 MG tablet Take 1 tablet (500 mg total) by mouth 2 (two) times daily. 14 tablet 2   polyethylene glycol (MIRALAX / GLYCOLAX) 17 g packet Take 17 g by mouth daily as needed for moderate constipation. 14 each 0   sertraline (ZOLOFT) 100 MG tablet Take 100 mg by mouth daily.     tinidazole (TINDAMAX) 500 MG tablet Take 2 tablets (1,000 mg total) by mouth daily with breakfast. 10 tablet 2   Vitamin D, Ergocalciferol, (DRISDOL) 1.25 MG (50000 UT) CAPS capsule One capsule by mouth twice per week  24 capsule 0   No current facility-administered medications for this visit.    Review of Systems Review of Systems Constitutional: negative for fatigue and weight loss Respiratory: negative for cough and wheezing Cardiovascular: negative for chest pain, fatigue and palpitations Gastrointestinal: negative for abdominal pain and change in bowel habits Genitourinary:positive for irregular vaginal spotting Integument/breast: negative for nipple discharge Musculoskeletal:negative for myalgias Neurological: negative for gait problems and tremors Behavioral/Psych: negative for abusive relationship, depression Endocrine: negative for temperature intolerance      Blood pressure 127/68, pulse 98, weight 134 lb (60.8 kg), last menstrual period 12/27/2018.  Physical Exam Physical Exam:  Deferred  >50% of 15 min visit spent on counseling and  coordination of care.   Data Reviewed SURGICAL PATHOLOGY Surgical pathology( [place]/ [facility]) Collected: 12/12/18 1026  Lab status: Final-Edited  Resulting lab: [facility]  Value: SURGICAL PATHOLOGY  CASE: [id number]  PATIENT: Catherine Osborne  Surgical Pathology Report      Clinical History: vaginal bleeding for past 2 months, AUB (cm)      FINAL MICROSCOPIC DIAGNOSIS:   A. ENDOMETRIUM, BIOPSY:  - Proliferative endometrium.  - No hyperplasia or malignancy.    GROSS DESCRIPTION:   Received in formalin are tan, hemorrhagic soft tissue fragments that are  entirely submitted.  Volume: 2 x 2 x 0.2 cm. (1 B)  (AK 12/12/2018)     Final Diagnosis performed by Vicente Males, MD.  Electronically signed  12/13/2018  Technical component performed at [facility]. [facility], [place] [address], [place], [place] [address].  Professional component performed at [facility],  [place] [address] [place], [place] [address].  Immunohistochemistry Technical component (if applicable) was performed  at [facility]. [address] [place],  [place], [place] [address].  IMMUNOHISTOCHEMISTRY DISCLAIMER (if applicable):  Some of these immunohistochemical stains may have been developed and the  performance characteristics determine by [facility]. Some  may not have been cleared or approved by the U.S. Food and Drug  Administration. The FDA has determined that such clearance or approval  is not necessary. This test is used for clinical purposes. It should not  be regarded as investigational or for research. This laboratory is  certified under the [place]  (CLIA-88) as qualified to perform high complexity clinical laboratory  testing. The controls stained appropriately.   Results Surgical pathology( [place]/ [place]) (Order [id number]) MyChart Results  Release  MyChart Status: Active Results Release  Surgical pathology( [place]/ [place]) Order: [id number] Status:  Edited Result - FINAL Visible to patient:  Yes (MyChart) Next appt:  01/11/2019 at 09:00 AM in Radiology ([facility] 3) Dx:  Abnormal uterine bleeding (AUB) Component 4 wk ago  SURGICAL PATHOLOGY SURGICAL PATHOLOGY  CASE: [id number]  PATIENT: Catherine Osborne  Surgical Pathology Report      Clinical History: vaginal bleeding for past 2 months, AUB (cm)      FINAL MICROSCOPIC DIAGNOSIS:   A. ENDOMETRIUM, BIOPSY:  - Proliferative endometrium.  - No hyperplasia or malignancy.    GROSS DESCRIPTION:   Received in formalin are tan, hemorrhagic soft tissue fragments that are  entirely submitted.  Volume: 2 x 2 x 0.2 cm. (1 B)  (AK 12/12/2018)     Final Diagnosis performed by Vicente Males, MD.  Electronically signed  12/13/2018  Technical component performed at [facility]. [facility], [place] [address], [place],  [address].  Professional component performed at [employer] [facility]  [place] [address] [place], [place] [address].  Immunohistochemistry Technical component (if applicable) was performed  at [facility]. [address], [place],  [place], [place] [address].  IMMUNOHISTOCHEMISTRY DISCLAIMER (if applicable):  Some of these immunohistochemical stains may have been developed and the  performance characteristics determine by [facility]. Some  may not have been cleared or approved by the U.S. Food and Drug  Administration. The FDA has determined that such clearance or approval  is not necessary. This test is used for clinical purposes. It should not  be regarded as investigational or for research. This laboratory is  certified under the [place]  (CLIA-88) as qualified to perform high complexity clinical laboratory  testing. The controls stained  appropriately.   Resulting Agency [facility]      Specimen Collected: 12/12/18 10:26 Last Resulted: 12/13/18 11:43      Lab Flowsheet    Order Details    View Encounter    Lab and Collection Details    Routing    Result History       Linked Documents  View Image      Result Information  Status Priority Source  Edited Result - FINAL (12/13/2018 1143) Routine Endometrium  Authorizing Provider Information  Name: Shelly Bombard, MD Fax: [phone]  Phone: [phone] Pager:   Status of Other Orders  Expected   POCT urine pregnancy [POC7 Custom] 12/12/18  All Reviewers List  Shelly Bombard, MD on 12/15/2018 14:11  Cervical Cancer Screening - Results and Follow-ups Selected result Result date Tests and Procedures Follow-ups   12/12/2018 Surgical pathology( [place]/ [place])   Cervical Cancer Screening History Report View [place] Info  Surgical pathology( [place]) (Order [id number]) on 12/12/18   US PELVIC COMPLETE WITH TRANSVAGINAL (Accession [id number]) (Order [id number]) Imaging Date: 12/11/2018 Department: [facility] Released By: Claudius Sis Authorizing: Shelly Bombard, MD  Exam Status  Status  Final [99]  PACS Intelerad Image Link  Show images for US PELVIC COMPLETE WITH TRANSVAGINAL  Study Result  CLINICAL DATA:  Pelvic pain, abnormal uterine bleeding, spotting for several months, pelvic pain with intercourse  EXAM: TRANSABDOMINAL AND TRANSVAGINAL ULTRASOUND OF PELVIS  TECHNIQUE: Both transabdominal and transvaginal ultrasound examinations of the pelvis were performed. Transabdominal technique was performed for global imaging of the pelvis including uterus, ovaries, adnexal regions, and pelvic cul-de-sac. It was necessary to proceed with endovaginal exam following the transabdominal exam to visualize the endometrium and lower uterine segment.  COMPARISON:   None  FINDINGS: Uterus  Measurements: 8.2 x 4.0 x 5.7 cm = volume: 98 mL. Anteverted. Several small leiomyomata are identified. Intramural leiomyoma at anterior upper uterus 13 x 9 x 12 mm. RIGHT lateral leiomyoma 13 x 8 x 8 mm. Additional fundal leiomyoma 14 x 10 x 12 mm.  Endometrium  Thickness: 6 mm.  No endometrial fluid or focal abnormality  Right ovary  Measurements: 2.6 x 2.0 x 1.9 cm = volume: 5.0 mL. Normal morphology without mass  Left ovary  Measurements: 3.6 x 1.9 x 1.8 cm = volume: 6.2 mL. Normal morphology without mass  Other findings  No free pelvic fluid.  No adnexal masses.  IMPRESSION: Three small uterine leiomyomata are identified.  Otherwise negative exam.   Electronically Signed   By: Lavonia Dana M.D.   On: 12/11/2018 15:22     Assessment     1. Abnormal uterine bleeding (  AUB) - clinically stable.  Will follow. - Endometrial Biopsy is normal - Ultrasound is normal except for a few small intramural and suberosal fibroids  2. BV (bacterial vaginosis) Rx: - metroNIDAZOLE (FLAGYL) 500 MG tablet; Take 1 tablet (500 mg total) by mouth 2 (two) times daily.  Dispense: 14 tablet; Refill: 2  3. Candida vaginitis Rx: - fluconazole (DIFLUCAN) 150 MG tablet; Take 1 tablet (150 mg total) by mouth once for 1 dose.  Dispense: 1 tablet; Refill: 0    Plan    Follow up in 4 months.  May need Aygestin regulation.   No orders of the defined types were placed in this encounter.  Meds ordered this encounter  Medications   metroNIDAZOLE (FLAGYL) 500 MG tablet    Sig: Take 1 tablet (500 mg total) by mouth 2 (two) times daily.    Dispense:  14 tablet    Refill:  2   fluconazole (DIFLUCAN) 150 MG tablet    Sig: Take 1 tablet (150 mg total) by mouth once for 1 dose.    Dispense:  1 tablet    Refill:  0    Shelly Bombard, MD 01/10/2019 2:09 PM

## 2019-01-11 ENCOUNTER — Ambulatory Visit (HOSPITAL_COMMUNITY)
Admission: RE | Admit: 2019-01-11 | Discharge: 2019-01-11 | Disposition: A | Discharge status: Home / Self Care | Payer: Medicaid Other | Source: Ambulatory Visit | Attending: Pulmonary Disease | Admitting: Pulmonary Disease

## 2019-01-11 ENCOUNTER — Other Ambulatory Visit: Payer: Self-pay

## 2019-01-11 DIAGNOSIS — R0602 Shortness of breath: Secondary | ICD-10-CM | POA: Diagnosis present

## 2019-01-11 DIAGNOSIS — R918 Other nonspecific abnormal finding of lung field: Secondary | ICD-10-CM | POA: Insufficient documentation

## 2019-01-11 NOTE — Progress Notes (Signed)
I tried calling the pt- line rang multiple times and there was no answer- then went to a fast busy signal. I went ahead and moved the 02/11/2018 appt with Aaron Edelman to 12 noon.

## 2019-01-15 ENCOUNTER — Other Ambulatory Visit: Payer: Self-pay

## 2019-01-15 ENCOUNTER — Ambulatory Visit: Payer: Medicaid Other | Admitting: Pulmonary Disease

## 2019-01-15 ENCOUNTER — Telehealth: Payer: Self-pay | Admitting: Pulmonary Disease

## 2019-01-15 ENCOUNTER — Encounter: Payer: Self-pay | Admitting: Pulmonary Disease

## 2019-01-15 VITALS — BP 110/86 | HR 74 | Temp 97.2°F | Ht 62.0 in | Wt 136.2 lb

## 2019-01-15 DIAGNOSIS — R911 Solitary pulmonary nodule: Secondary | ICD-10-CM | POA: Diagnosis not present

## 2019-01-15 DIAGNOSIS — F1220 Cannabis dependence, uncomplicated: Secondary | ICD-10-CM

## 2019-01-15 DIAGNOSIS — J3081 Allergic rhinitis due to animal (cat) (dog) hair and dander: Secondary | ICD-10-CM | POA: Diagnosis not present

## 2019-01-15 DIAGNOSIS — J4520 Mild intermittent asthma, uncomplicated: Secondary | ICD-10-CM

## 2019-01-15 MED ORDER — BREO ELLIPTA 100-25 MCG/INH IN AEPB: 1.0000 | INHALATION_SPRAY | Freq: Every day | RESPIRATORY_TRACT | 5 refills | Status: DC

## 2019-01-15 NOTE — Telephone Encounter (Signed)
Pt calling to schedule her CT scan

## 2019-01-15 NOTE — Telephone Encounter (Signed)
lmtcb for pt.  

## 2019-01-15 NOTE — Progress Notes (Signed)
[facility]  Chief Complaint  Patient presents with  . Follow-up    Patient is here for asthma. Patient has noticed that she gets winded quickly with exertion.    Constitutional:  BP 110/86 (BP Location: Right Arm, Patient Position: Sitting, Cuff Size: Normal)   Pulse 74   Temp (!) 97.2 F (36.2 C) (Temporal)   Ht 5\' 2"  (1.575 m)   Wt 136 lb 3.2 oz (61.8 kg)   LMP 12/27/2018   SpO2 98% Comment: on RA  BMI 24.91 kg/m   Past Medical History:  Pneumonia, Allergies  Brief Summary:  Catherine Osborne is a 43 y.o. female former smoker with asthma and lung nodule.  She had CT chest last week.  No change.  She has PFT scheduled for January.  She ran out of breo and albuterol about 1 month ago.  She hasn't felt like she needed any inhalers recently.    She is down to smoking 15 blunts a day (was smoking 50 a day before).  She gets panicked and feels like she can't breath when she is anger, excited, or engaging in intercourse.  She also notices having more trouble with her breathing when she puts ice in her bong >> breathing not as bad if she just smokes blunts.  She otherwise isn't having sinus congestion, sore throat, sputum, or chest pain.  Physical Exam:   Appearance - well kempt   ENMT - no sinus tenderness, no nasal discharge, no oral exudate  Neck - no masses, trachea midline, no thyromegaly, no elevation in JVP  Respiratory - normal appearance of chest wall, normal respiratory effort w/o accessory muscle use, no dullness on percussion, no wheezing or rales  CV - s1s2 regular rate and rhythm, no murmurs, no peripheral edema, radial pulses symmetric  GI - soft, non tender  Lymph - no adenopathy noted in neck and axillary areas  MSK - normal gait  Ext - no cyanosis, clubbing, or joint inflammation noted  Skin - no rashes, lesions, or ulcers  Neuro - normal strength, oriented x 3  Psych - normal mood and  affect   Assessment/Plan:   Allergic asthma. - she has PFT scheduled for January 2021 and f/u with Wyn Quaker afterward - Breo prn  Exercise induced asthma. - discussed techniques to minimize airway irritation prior to engaging in physical activities - demonstrated pursed lip breathing technique  Allergic rhinitis. - prn flonase  Lung nodules. - reviewed CT chest with her myself from 01/11/19 - will need f/u CT chest w/o contrast in June 2021  Marijuana dependence. - advised her to quit smoking   Patient Instructions  Will make sure your have pulmonary function test appointment schedule for January 2021 and appointment with Wyn Quaker the same day as pulmonary function test  Will schedule CT chest without contrast for June 2021  Breo one puff as needed for cough, wheeze, chest congestion, or shortness of breath  Follow up in 6 months after CT chest done   A total of  26 minutes were spent face to face with the patient and more than half of that time involved counseling or coordination of care.   Chesley Mires, MD [place] [facility] Pager: [phone] 01/15/2019, 10:55 AM  Flow Sheet     Pulmonary tests:  Spirometry 11/04/13 >> FEV1 1.86 (76%), FEV1% 64 Allergy test 11/04/13 >> dust mite, cockroach, dogs, trees  Chest imaging:  CT chest 10/05/18 >> b/l irregular nodules up  to 2.3 cm CT chest 01/11/19 >> b/l irregular nodules up to 2.1 cm   Medications:   Allergies as of 01/15/2019   No Known Allergies     Medication List       Accurate as of January 15, 2019 10:55 AM. If you have any questions, ask your nurse or doctor.        STOP taking these medications   albuterol (2.5 MG/3ML) 0.083% nebulizer solution Commonly known as: PROVENTIL Stopped by: Chesley Mires, MD   albuterol 108 (90 Base) MCG/ACT inhaler Commonly known as: VENTOLIN HFA Stopped by: Chesley Mires, MD   metroNIDAZOLE 500 MG tablet Commonly known as: FLAGYL Stopped by:  Chesley Mires, MD     TAKE these medications   Breo Ellipta 100-25 MCG/INH Aepb Generic drug: fluticasone furoate-vilanterol Inhale 1 puff into the lungs daily.   polyethylene glycol 17 g packet Commonly known as: MIRALAX / GLYCOLAX Take 17 g by mouth daily as needed for moderate constipation.   sertraline 100 MG tablet Commonly known as: ZOLOFT Take 100 mg by mouth daily.   tinidazole 500 MG tablet Commonly known as: Tindamax Take 2 tablets (1,000 mg total) by mouth daily with breakfast.   Vitamin D (Ergocalciferol) 1.25 MG (50000 UT) Caps capsule Commonly known as: DRISDOL One capsule by mouth twice per week       Past Surgical History:  She  has a past surgical history that includes Foot surgery; Dilation and evacuation (N/A, 05/22/2012); Cervical cerclage (N/A, 02/26/2015); and Dilation and curettage of uterus.  Family History:  Her family history includes Bone cancer in her mother; Breast cancer in her maternal grandmother; Diabetes in an other family member; Heart attack in an other family member; Hypertension in an other family member; Stroke in an other family member. She was adopted.  Social History:  She  reports that she quit smoking about a year ago. Her smoking use included cigarettes. She has a 15.00 pack-year smoking history. She has never used smokeless tobacco. She reports previous alcohol use of about 1.0 standard drinks of alcohol per week. She reports current drug use. Frequency: 210.00 times per week. Drug: Marijuana.

## 2019-01-15 NOTE — Patient Instructions (Signed)
Will make sure your have pulmonary function test appointment schedule for January 2021 and appointment with Wyn Quaker the same day as pulmonary function test  Will schedule CT chest without contrast for June 2021  Breo one puff as needed for cough, wheeze, chest congestion, or shortness of breath  Follow up in 6 months after CT chest done

## 2019-01-16 NOTE — Telephone Encounter (Signed)
  You are scheduled on 01/21/2019 at 8 am at [facility]. You should arrive 15 minutes prior to your appointment time for registration. Please follow the written instructions below on the day of your exam:  1) Do not eat or drink anything after 4am (4 hours prior to your test) 2) You have been given 2 bottles of oral contrast to drink. The solution may taste better if refrigerated, but do NOT add ice or any other liquid to this solution. Shake well before drinking.    Drink 1 bottle of contrast @ 6am (2 hours prior to your exam)  Drink 1 bottle of contrast @ 7am (1 hour prior to your exam)  You may take any medications as prescribed with a small amount of water, if necessary. If you take any of the following medications: METFORMIN, GLUCOPHAGE, GLUCOVANCE, AVANDAMET, RIOMET, FORTAMET, [place] MET, JANUMET, GLUMETZA or METAGLIP, you MAY be asked to HOLD this medication 48 hours AFTER the exam.  The purpose of you drinking the oral contrast is to aid in the visualization of your intestinal tract. The contrast solution may cause some diarrhea. Depending on your individual set of symptoms, you may also receive an intravenous injection of x-ray contrast/dye. Plan on being at [facility] for 30 minutes or longer, depending on the type of exam you are having performed.  This test typically takes 30-45 minutes to complete.  If you have any questions regarding your exam or if you need to reschedule, you may call the CT department at [phone] between the hours of 8:00 am and 5:00 pm, Monday-Friday.  ________________________________________________________________________   Patient aware of date and time of CT scan and instructions on contrast

## 2019-01-16 NOTE — Addendum Note (Signed)
Addended by: Oda Kilts on: 01/16/2019 02:56 PM   Modules accepted: Orders

## 2019-01-16 NOTE — Telephone Encounter (Signed)
Patient had CT Chest on 12/11, recommendations to repeat scan in 3-6 months.  Patient has CT Abdomen & Pelvis scheduled for 12/21  ATC patient, line rang numerous times with no answer and then the line went busy.

## 2019-01-17 NOTE — Telephone Encounter (Signed)
atc pt X3, no answer, no vm available.  Will close message per triage protocol.

## 2019-01-21 ENCOUNTER — Ambulatory Visit (HOSPITAL_COMMUNITY): Admission: RE | Admit: 2019-01-21 | Payer: Medicaid Other | Source: Ambulatory Visit

## 2019-01-22 ENCOUNTER — Ambulatory Visit (HOSPITAL_COMMUNITY): Payer: Medicaid Other

## 2019-01-22 ENCOUNTER — Telehealth: Payer: Self-pay | Admitting: Gastroenterology

## 2019-01-22 NOTE — Telephone Encounter (Signed)
CT Has been denied.  [id number] CT ABDOMEN and PELVIS; with contrastDeniedBased on eviCore Abdomen Imaging Guidelines Section(s): AB 2.2 Abdominal Pain and Preface to the Imaging Guidelines, section Preface-3 Clinical Information, we cannot approve this request. Your records show that you have belly (abdomen) pain. The reason this request cannot be approved is because: 1. There is an approval code for the same study, or a similar study, that is on file in the [facility] records. This approval is still in effect. The clinical information submitted does not describe results of this prior imaging, or if there are plans to use this approval. The approved study may be sufficient for the evaluation of the current clinical condition. Guidelines do not support additional imaging without knowing the outcome of the prior approval and, therefore, the request is not indicated at this time.

## 2019-01-23 NOTE — Telephone Encounter (Signed)
Ok , please inform patient insurance denied CT abd. Thanks

## 2019-01-23 NOTE — Telephone Encounter (Signed)
Radiology notified the patient when they cancelled the appointment.

## 2019-02-05 ENCOUNTER — Telehealth: Payer: Self-pay | Admitting: Pulmonary Disease

## 2019-02-05 NOTE — Telephone Encounter (Signed)
Pt has access to Mychart & I mailed appt info to pt.

## 2019-02-09 ENCOUNTER — Other Ambulatory Visit (HOSPITAL_COMMUNITY): Payer: Medicaid Other

## 2019-02-12 ENCOUNTER — Ambulatory Visit: Payer: Medicaid Other | Admitting: Pulmonary Disease

## 2019-02-28 ENCOUNTER — Telehealth: Payer: Self-pay | Admitting: *Deleted

## 2019-02-28 NOTE — Telephone Encounter (Signed)
Contacted patient and scheduled covid test for Mon Feb 1. Pt informed of covid testing and site as well as PFT on Feb 3 with a follow up afterwards. Nothing further needed.

## 2019-03-01 ENCOUNTER — Other Ambulatory Visit: Payer: Self-pay

## 2019-03-01 ENCOUNTER — Encounter: Payer: Self-pay | Admitting: Podiatry

## 2019-03-01 ENCOUNTER — Ambulatory Visit: Payer: Medicaid Other | Admitting: Podiatry

## 2019-03-01 ENCOUNTER — Encounter: Payer: Self-pay | Admitting: Obstetrics

## 2019-03-01 ENCOUNTER — Ambulatory Visit: Payer: Medicaid Other | Admitting: Obstetrics

## 2019-03-01 VITALS — Wt 139.0 lb

## 2019-03-01 DIAGNOSIS — D492 Neoplasm of unspecified behavior of bone, soft tissue, and skin: Secondary | ICD-10-CM | POA: Diagnosis not present

## 2019-03-01 DIAGNOSIS — L905 Scar conditions and fibrosis of skin: Secondary | ICD-10-CM

## 2019-03-01 DIAGNOSIS — K5901 Slow transit constipation: Secondary | ICD-10-CM | POA: Diagnosis not present

## 2019-03-01 DIAGNOSIS — K641 Second degree hemorrhoids: Secondary | ICD-10-CM

## 2019-03-01 MED ORDER — HYDROCORTISONE ACETATE 25 MG RE SUPP: 25.0000 mg | Freq: Two times a day (BID) | RECTAL | 11 refills | Status: DC

## 2019-03-01 MED ORDER — DOCUSATE SODIUM 100 MG PO CAPS: 100.0000 mg | ORAL_CAPSULE | Freq: Two times a day (BID) | ORAL | 11 refills | Status: DC

## 2019-03-01 MED ORDER — POLYETHYLENE GLYCOL 3350 17 G PO PACK: 17.0000 g | PACK | Freq: Every day | ORAL | 11 refills | Status: DC

## 2019-03-01 NOTE — Progress Notes (Signed)
[name], female   DOB: March 08, 1975, 44 y.o.   MRN: [id number]  Chief Complaint  Patient presents with  . Rectal Pain    for the past week no bleeding    HPI Catherine Osborne is a 44 y.o. female.  Complains of constipation and painful hemorrhoids. HPI  Past Medical History:  Diagnosis Date  . Allergy   . Asthma   . Headache   . 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 Pneumonia   . Prior miscarriage with pregnancy in first trimester, antepartum   . Vaginal Pap smear, abnormal     Past Surgical History:  Procedure Laterality Date  . CERVICAL CERCLAGE N/A 02/26/2015   Procedure: CERCLAGE CERVICAL;  Surgeon: Frederico Hamman, MD;  Location: [facility];  Service: Gynecology;  Laterality: N/A;  . DILATION AND CURETTAGE OF UTERUS    . DILATION AND EVACUATION N/A 05/22/2012   Procedure: DILATATION AND EVACUATION;  Surgeon: Frederico Hamman, MD;  Location: [facility];  Service: Gynecology;  Laterality: N/A;  . FOOT SURGERY      Family History  Adopted: Yes  Problem Relation Age of Onset  . Bone cancer Mother   . Breast cancer Maternal Grandmother   . Diabetes Other   . Hypertension Other   . Stroke Other   . Heart attack Other     Social History Social History   Tobacco Use  . Smoking status: Former Smoker    Packs/day: 0.75    Years: 20.00    Pack years: 15.00    Types: Cigarettes    Quit date: 01/31/2018    Years since quitting: 1.0  . Smokeless tobacco: Never Used  . Tobacco comment: marijuana trying to decrease from 20 blunts day to 5 now  Substance Use Topics  . Alcohol use: Not Currently    Alcohol/week: 1.0 standard drinks    Types: 1 Glasses of wine per week    Comment: rare - no liquor since april 2020/ drinks wine   . Drug use: Yes    Frequency: 210.0 times per week    Types: Marijuana    Comment: 20 blunts a day now to 5 a day - trying to quit    No Known Allergies  Current Outpatient Medications  Medication Sig Dispense Refill  . BREO  ELLIPTA 100-25 MCG/INH AEPB Inhale 1 puff into the lungs daily. 28 each 5  . Vitamin D, Ergocalciferol, (DRISDOL) 1.25 MG (50000 UT) CAPS capsule One capsule by mouth twice per week 24 capsule 0  . docusate sodium (COLACE) 100 MG capsule Take 1 capsule (100 mg total) by mouth 2 (two) times daily. 60 capsule 11  . hydrocortisone (ANUSOL-HC) 25 MG suppository Place 1 suppository (25 mg total) rectally 2 (two) times daily. 12 suppository 11  . polyethylene glycol (MIRALAX / GLYCOLAX) 17 g packet Take 17 g by mouth at bedtime. 30 each 11  . sertraline (ZOLOFT) 100 MG tablet Take 100 mg by mouth daily.    Marland Kitchen tinidazole (TINDAMAX) 500 MG tablet Take 2 tablets (1,000 mg total) by mouth daily with breakfast. (Patient not taking: Reported on 03/01/2019) 10 tablet 2   No current facility-administered medications for this visit.    Review of Systems Review of Systems Constitutional: negative for fatigue and weight loss Respiratory: negative for cough and wheezing Cardiovascular: negative for chest pain, fatigue and palpitations Gastrointestinal: negative for abdominal pain, and positive for constipation and painful hemorrhoids Genitourinary:negative Integument/breast: negative for nipple discharge Musculoskeletal:negative  for myalgias Neurological: negative for gait problems and tremors Behavioral/Psych: negative for abusive relationship, depression Endocrine: negative for temperature intolerance      Weight 139 lb (63 kg).  Physical Exam Physical Exam General:   alert  Skin:   no rash or abnormalities  Lungs:   clear to auscultation bilaterally  Heart:   regular rate and rhythm, S1, S2 normal, no murmur, click, rub or gallop  Breasts:   normal without suspicious masses, skin or nipple changes or axillary nodes  Abdomen:  normal findings: no organomegaly, soft, non-tender and no hernia  Pelvis:  External genitalia: normal general appearance Urinary system: urethral meatus normal and bladder  without fullness, nontender Vaginal: normal without tenderness, induration or masses Cervix: normal appearance Adnexa: normal bimanual exam Uterus: anteverted and non-tender, normal size              Anus:  Grade 2 hemorrhoids   50% of 15 min visit spent on counseling and coordination of care.   Data Reviewed Labs  Assessment     1. Grade II hemorrhoids Rx: - hydrocortisone (ANUSOL-HC) 25 MG suppository; Place 1 suppository (25 mg total) rectally 2 (two) times daily.  Dispense: 12 suppository; Refill: 11  2. Constipation by delayed colonic transit Rx: - polyethylene glycol (MIRALAX / GLYCOLAX) 17 g packet; Take 17 g by mouth at bedtime.  Dispense: 30 each; Refill: 11 - docusate sodium (COLACE) 100 MG capsule; Take 1 capsule (100 mg total) by mouth 2 (two) times daily.  Dispense: 60 capsule; Refill: 11    Plan    Follow up prn   Meds ordered this encounter  Medications  . hydrocortisone (ANUSOL-HC) 25 MG suppository    Sig: Place 1 suppository (25 mg total) rectally 2 (two) times daily.    Dispense:  12 suppository    Refill:  11  . polyethylene glycol (MIRALAX / GLYCOLAX) 17 g packet    Sig: Take 17 g by mouth at bedtime.    Dispense:  30 each    Refill:  11  . docusate sodium (COLACE) 100 MG capsule    Sig: Take 1 capsule (100 mg total) by mouth 2 (two) times daily.    Dispense:  60 capsule    Refill:  11      Shelly Bombard, MD 03/01/2019 10:59 AM

## 2019-03-01 NOTE — Patient Instructions (Signed)
Scar Revision A scar is your body's way of healing an injury. Scars may be caused by surgery, cuts, scrapes, burns, or a skin disease such as acne. Scars can change the appearance of your skin if they are wide, raised, sunken, or discolored. A scar near your eye or lip may change the appearance of your face. A scar may also cause your skin to tighten, which can restrict movement. Scar revision is a procedure to reduce, remove, or improve a scar. What are the different techniques for scar revision? Several techniques can be used for scar revision. Your health care provider may use more than one technique. The technique that is used will depend on the size, location, and type of scar you have. Some techniques require anesthesia. You may be given one or more of the following:  A medicine to help you relax (sedative).  A medicine to numb the area (local anesthetic).  A medicine to make you fall asleep (general anesthetic). Common techniques for scar revision include injections, surface techniques, and surgical scar removal. Injections  A protein solution (collagen) may be used to fill sunken scars. The results may last several months or a few years.  A steroid injection may be used to shrink raised scars. Surface Techniques These techniques reduce scars by removing the top layers of scar tissue. Options include:  Dermabrasion. This involves a mechanical sanding away of scar tissue.  Laser resurfacing. Focused light is used to remove scar tissue.  Chemical peel. A chemical is used to remove scar tissue.  Bleaching. A chemical is used to lighten scar tissue that is dark or red. Surgical Scar Removal These procedures involve cutting out the scar and closing the skin to create a smaller, less visible scar. Types include:  Scar excision. This involves removing the scar and closing the skin in layers. The skin is closed using fine stitches (sutures), glue, or tape.  Scar excision with a skin  flap. The scar is removed, and the skin is closed using a flap of your skin. Normal skin near the scar is lifted and moved into the area where the scar was removed.  Scar excision with a skin graft. The scar is removed, and a skin graft is used to close the area of removal. The graft may come from another area of your skin, a donor, or a skin substitute.  Tissue expansion. This involves placing a balloon under or near the scar. The balloon is gradually blown up to stretch the skin. After your skin is stretched, your scar can be removed, and the stretched skin can be used to close the incision. What are the risks and benefits? The main benefit of scar revision is a better look for your skin. You also may have freer movement if you had a tight scar. Risks of the procedure may include:  Bleeding.  Bleeding under the skin (hematoma).  Infection.  Allergic reactions to medicines.  Numbness.  Pain.  Damage to nerves or blood vessels.  Changes in skin color.  A need for more revision. Your scar may not be completely gone after a revision procedure. Talk with your health care provider about what to expect. How should I take care of my scar after the procedure? Follow all instructions from your health care provider. These may vary depending on the type of procedure that you had. Be aware that it is normal to have some swelling, discomfort, and redness for a week or two. Healing may take several weeks or months.  Full healing may take up to a year. Follow these general instructions after any type of scar revision:  Do not drive for 24 hours if you received a sedative.  Do not take baths, swim, or use a hot tub until your health care provider approves.  If directed, apply a cold compress to the scar area to relieve swelling, itching, or discomfort.  Return to your normal activities as told by your health care provider. Ask your health care provider what activities are safe for you.  Avoid  activities that stretch the skin area where your scar was treated.  Avoid exposing the scar revision area to the sun. Ask your health care provider if you should use a sunscreen lotion.  Do not use any tobacco products, such as cigarettes, chewing tobacco, and e-cigarettes. They can slow healing. If you need help quitting, ask your health care provider.  Use over-the-counter and prescription medicines, creams, or ointments only as told by your health care provider.  Keep all follow-up visits as told by your health care provider. This is important. After a surgical scar removal, you may be given these additional instructions:  Keep the bandage (dressing) dry until your health care provider says it can be removed.  Wash your hands with soap and water before you change your dressing. If soap and water are not available, use hand sanitizer.  Change your dressing as told by your health care provider.  Leave stitches (sutures), skin glue, or adhesive strips in place. These skin closures may need to stay in place for 2 weeks or longer. If adhesive strip edges start to loosen and curl up, you may trim the loose edges. Do not remove adhesive strips completely unless your health care provider tells you to do that.  Check your incision area every day for signs of infection. Check for: ? More redness, swelling, or pain. ? More fluid or blood. ? Warmth. ? Pus or a bad smell.  When should I seek medical care? Seek medical care if:  You have a fever.  You have signs of infection.  You have bleeding or fresh bruising.  You have numbness.  You have increasing pain. This information is not intended to replace advice given to you by your health care provider. Make sure you discuss any questions you have with your health care provider. Document Revised: 05/11/2018 Document Reviewed: 02/01/2015 Elsevier Patient Education  2020 [employer].

## 2019-03-02 NOTE — Progress Notes (Signed)
Subjective: Catherine Osborne presents today for follow up of follow up plantar keloid scar left foot.   No Known Allergies   Objective: There were no vitals filed for this visit.  Vascular Examination:  Capillary refill time to digits immediate b/l, palpable DP pulses b/l, palpable PT pulses b/l, pedal hair present b/l and skin temperature gradient within normal limits b/l  Dermatological Examination: Pedal skin with normal turgor, texture and tone bilaterally, no open wounds bilaterally and no interdigital macerations bilaterally.  Hypererkatotic keloid scar plantar left foot with pain on palpation. No erythema, no edema, no drainage.  Musculoskeletal: Normal muscle strength 5/5 to all lower extremity muscle groups bilaterally, no gross bony deformities bilaterally and no pain crepitus or joint limitation noted with ROM b/l  Neurological: Protective sensation intact 5/5 intact bilaterally with 10g monofilament b/l and vibratory sensation intact b/l  Assessment: 1. Painful cutaneous scar     Plan: -Patient to continue soft, supportive shoe gear daily. -Pared painful keloid lesion. Dispensed pads for offloading lesion of left foot. Discussed procedure for injecting fat to provide more cushion to her foot.  -Patient to report any pedal injuries to medical professional immediately. -Patient/POA to call should there be question/concern in the interim.  Return in about 9 weeks (around 05/03/2019) for painful scar left foot.

## 2019-03-04 ENCOUNTER — Other Ambulatory Visit (HOSPITAL_COMMUNITY): Payer: Medicaid Other

## 2019-03-04 ENCOUNTER — Other Ambulatory Visit (HOSPITAL_COMMUNITY)
Admission: RE | Admit: 2019-03-04 | Discharge: 2019-03-04 | Disposition: A | Discharge status: Home / Self Care | Payer: Medicaid Other | Source: Ambulatory Visit | Attending: Pulmonary Disease | Admitting: Pulmonary Disease

## 2019-03-04 DIAGNOSIS — Z01812 Encounter for preprocedural laboratory examination: Secondary | ICD-10-CM | POA: Insufficient documentation

## 2019-03-04 DIAGNOSIS — U071 COVID-19: Secondary | ICD-10-CM | POA: Insufficient documentation

## 2019-03-05 ENCOUNTER — Telehealth: Payer: Self-pay | Admitting: Pulmonary Disease

## 2019-03-05 ENCOUNTER — Ambulatory Visit: Payer: Medicaid Other | Admitting: Podiatry

## 2019-03-05 LAB — SARS CORONAVIRUS 2 (TAT 6-24 HRS): SARS Coronavirus 2: POSITIVE — AB

## 2019-03-05 NOTE — Telephone Encounter (Signed)
Can schedule patient as a televisit with an APP or Dr. Halford Chessman.   Can always move the office was set up with the patient would like.Wyn Quaker, FNP

## 2019-03-05 NOTE — Telephone Encounter (Signed)
Received a call from [place] [facility]. States that the pt's COVID test from yesterday came back positive. Spoke with pt and made her aware. I have gone over all of the precautions that the pt should take >> quarantine, good hand hygiene, etc. Pt is concerned because she has small children that sleep in the bed with her. Advised her that she needs to contact her children's pediatrician and see what they recommend she does about getting them tested. Pt is scheduled for a PFT and OV on 03/07/2019. PFT has been canceled and OV has been switched to a televisit.  Aaron Edelman - pt wants to speak to you about this. Thanks.

## 2019-03-05 NOTE — Progress Notes (Signed)
Patient with positive covid result. Contacted MD, spoke with Ria Comment, RN informed of result.

## 2019-03-05 NOTE — Telephone Encounter (Signed)
Called the patient and confirmed she has been scheduled for televisit on 03/07/19. Patient stated she contacted the pediatrician.  Reminded her to follow prior advice given. Wash down door handles, light switches, etc. Patient voiced understanding. Nothing further needed at this time.

## 2019-03-06 ENCOUNTER — Ambulatory Visit: Payer: Medicaid Other | Admitting: Pulmonary Disease

## 2019-03-07 ENCOUNTER — Encounter: Payer: Self-pay | Admitting: Pulmonary Disease

## 2019-03-07 ENCOUNTER — Other Ambulatory Visit: Payer: Self-pay

## 2019-03-07 ENCOUNTER — Ambulatory Visit (INDEPENDENT_AMBULATORY_CARE_PROVIDER_SITE_OTHER): Payer: Medicaid Other | Admitting: Pulmonary Disease

## 2019-03-07 DIAGNOSIS — R918 Other nonspecific abnormal finding of lung field: Secondary | ICD-10-CM | POA: Diagnosis not present

## 2019-03-07 DIAGNOSIS — U071 COVID-19: Secondary | ICD-10-CM

## 2019-03-07 DIAGNOSIS — J4520 Mild intermittent asthma, uncomplicated: Secondary | ICD-10-CM | POA: Diagnosis not present

## 2019-03-07 DIAGNOSIS — N2889 Other specified disorders of kidney and ureter: Secondary | ICD-10-CM | POA: Diagnosis not present

## 2019-03-07 NOTE — Assessment & Plan Note (Signed)
Tested positive for Covid on 03/04/2019  Reviewed symptomatic management Reviewed symptoms to monitor Supportive measures at this point in time  Patient knows to contact our office if symptoms worsen, or if she has additional questions or concerns Ordered MyChart home monitoring the patient reports that she is not comfortable using MyChart

## 2019-03-07 NOTE — Assessment & Plan Note (Signed)
Patient is to follow-up with primary care and nephrology

## 2019-03-07 NOTE — Progress Notes (Signed)
Reviewed and agree with assessment/plan.   Yiselle Babich, MD [facility] 01/27/2016, 12:24 PM Pager:  [phone]  

## 2019-03-07 NOTE — Patient Instructions (Addendum)
You were seen today by Lauraine Rinne, NP  for:   1. COVID-19 virus detected  - MYCHART COVID-19 HOME MONITORING PROGRAM; Future - Temperature monitoring; Future  2. Mild intermittent asthma without complication  Breo Ellipta 100 >>> Take 1 puff daily in the morning right when you wake up >>>Rinse your mouth out after use >>>This is a daily maintenance inhaler, NOT a rescue inhaler >>>Contact our office if you are having difficulties affording or obtaining this medication >>>It is important for you to be able to take this daily and not miss any doses    3. Renal mass  Continue to follow-up with primary care and nephrology  4. Abnormal findings on diagnostic imaging of lung  We will repeat the CT of your chest in June/2021   Follow Up:    Return in about 3 months (around 06/04/2019), or if symptoms worsen or fail to improve, for Follow up for PFT, Follow up with Dr. Halford Chessman.   Please do your part to reduce the spread of COVID-19:      Reduce your risk of any infection  and COVID19 by using the similar precautions used for avoiding the common cold or flu:  Marland Kitchen Wash your hands often with soap and warm water for at least 20 seconds.  If soap and water are not readily available, use an alcohol-based hand sanitizer with at least 60% alcohol.  . If coughing or sneezing, cover your mouth and nose by coughing or sneezing into the elbow areas of your shirt or coat, into a tissue or into your sleeve (not your hands). Langley Gauss A MASK when in public  . Avoid shaking hands with others and consider head nods or verbal greetings only. . Avoid touching your eyes, nose, or mouth with unwashed hands.  . Avoid close contact with people who are sick. . Avoid places or events with large numbers of people in one location, like concerts or sporting events. . If you have some symptoms but not all symptoms, continue to monitor at home and seek medical attention if your symptoms worsen. . If you are having a  medical emergency, call 911.   [place] / e-Visit: [email or web address]         [facility]: [facility]: [id number]                   [facility]: [id number]     It is flu season:   >>> Best ways to protect herself from the flu: Receive the yearly flu vaccine, practice good hand hygiene washing with soap and also using hand sanitizer when available, eat a nutritious meals, get adequate rest, hydrate appropriately   Please contact the office if your symptoms worsen or you have concerns that you are not improving.   Thank you for choosing [facility] for your healthcare, and for allowing [place] to partner with you on your healthcare journey. I am thankful to be able to provide care to you today.   Wyn Quaker FNP-C    [id number] COVID-19 is a respiratory infection that is caused by a virus called severe acute respiratory syndrome coronavirus 2 (SARS-CoV-2). The disease is also known as coronavirus disease or novel coronavirus. In some people, the virus may not cause any symptoms. In others, it may cause a serious infection. The infection can get worse quickly and can lead to complications, such as:  Pneumonia, or  infection of the lungs.  Acute respiratory distress syndrome or ARDS. This is a condition in which fluid build-up in the lungs prevents the lungs from filling with air and passing oxygen into the blood.  Acute respiratory failure. This is a condition in which there is not enough oxygen passing from the lungs to the body or when carbon dioxide is not passing from the lungs out of the body.  Sepsis or septic shock. This is a serious bodily reaction to an infection.  Blood clotting problems.  Secondary infections due to bacteria or fungus.  Organ failure. This is when your body's organs stop working. The virus  that causes COVID-19 is contagious. This means that it can spread from person to person through droplets from coughs and sneezes (respiratory secretions). What are the causes? This illness is caused by a virus. You may catch the virus by:  Breathing in droplets from an infected person. Droplets can be spread by a person breathing, speaking, singing, coughing, or sneezing.  Touching something, like a table or a doorknob, that was exposed to the virus (contaminated) and then touching your mouth, nose, or eyes. What increases the risk? Risk for infection You are more likely to be infected with this virus if you:  Are within 6 feet (2 meters) of a person with COVID-19.  Provide care for or live with a person who is infected with COVID-19.  Spend time in crowded indoor spaces or live in shared housing. Risk for serious illness You are more likely to become seriously ill from the virus if you:  Are 56 years of age or older. The higher your age, the more you are at risk for serious illness.  Live in a nursing home or long-term care facility.  Have cancer.  Have a long-term (chronic) disease such as: ? Chronic lung disease, including chronic obstructive pulmonary disease or asthma. ? A long-term disease that lowers your body's ability to fight infection (immunocompromised). ? Heart disease, including heart failure, a condition in which the arteries that lead to the heart become narrow or blocked (coronary artery disease), a disease which makes the heart muscle thick, weak, or stiff (cardiomyopathy). ? Diabetes. ? Chronic kidney disease. ? Sickle cell disease, a condition in which red blood cells have an abnormal "sickle" shape. ? Liver disease.  Are obese. What are the signs or symptoms? Symptoms of this condition can range from mild to severe. Symptoms may appear any time from 2 to 14 days after being exposed to the virus. They include:  A fever or chills.  A cough.  Difficulty  breathing.  Headaches, body aches, or muscle aches.  Runny or stuffy (congested) nose.  A sore throat.  New loss of taste or smell. Some people may also have stomach problems, such as nausea, vomiting, or diarrhea. Other people may not have any symptoms of COVID-19. How is this diagnosed? This condition may be diagnosed based on:  Your signs and symptoms, especially if: ? You live in an area with a COVID-19 outbreak. ? You recently traveled to or from an area where the virus is common. ? You provide care for or live with a person who was diagnosed with COVID-19. ? You were exposed to a person who was diagnosed with COVID-19.  A physical exam.  Lab tests, which may include: ? Taking a sample of fluid from the back of your nose and throat (nasopharyngeal fluid), your nose, or your throat using a swab. ? A sample of mucus  from your lungs (sputum). ? Blood tests.  Imaging tests, which may include, X-rays, CT scan, or ultrasound. How is this treated? At present, there is no medicine to treat COVID-19. Medicines that treat other diseases are being used on a trial basis to see if they are effective against COVID-19. Your health care provider will talk with you about ways to treat your symptoms. For most people, the infection is mild and can be managed at home with rest, fluids, and over-the-counter medicines. Treatment for a serious infection usually takes places in a hospital intensive care unit (ICU). It may include one or more of the following treatments. These treatments are given until your symptoms improve.  Receiving fluids and medicines through an IV.  Supplemental oxygen. Extra oxygen is given through a tube in the nose, a face mask, or a hood.  Positioning you to lie on your stomach (prone position). This makes it easier for oxygen to get into the lungs.  Continuous positive airway pressure (CPAP) or bi-level positive airway pressure (BPAP) machine. This treatment uses mild  air pressure to keep the airways open. A tube that is connected to a motor delivers oxygen to the body.  Ventilator. This treatment moves air into and out of the lungs by using a tube that is placed in your windpipe.  Tracheostomy. This is a procedure to create a hole in the neck so that a breathing tube can be inserted.  Extracorporeal membrane oxygenation (ECMO). This procedure gives the lungs a chance to recover by taking over the functions of the heart and lungs. It supplies oxygen to the body and removes carbon dioxide. Follow these instructions at home: Lifestyle  If you are sick, stay home except to get medical care. Your health care provider will tell you how long to stay home. Call your health care provider before you go for medical care.  Rest at home as told by your health care provider.  Do not use any products that contain nicotine or tobacco, such as cigarettes, e-cigarettes, and chewing tobacco. If you need help quitting, ask your health care provider.  Return to your normal activities as told by your health care provider. Ask your health care provider what activities are safe for you. General instructions  Take over-the-counter and prescription medicines only as told by your health care provider.  Drink enough fluid to keep your urine pale yellow.  Keep all follow-up visits as told by your health care provider. This is important. How is this prevented?  There is no vaccine to help prevent COVID-19 infection. However, there are steps you can take to protect yourself and others from this virus. To protect yourself:   Do not travel to areas where COVID-19 is a risk. The areas where COVID-19 is reported change often. To identify high-risk areas and travel restrictions, check the CDC travel website: FatFares.com.br  If you live in, or must travel to, an area where COVID-19 is a risk, take precautions to avoid infection. ? Stay away from people who are  sick. ? Wash your hands often with soap and water for 20 seconds. If soap and water are not available, use an alcohol-based hand sanitizer. ? Avoid touching your mouth, face, eyes, or nose. ? Avoid going out in public, follow guidance from your state and local health authorities. ? If you must go out in public, wear a cloth face covering or face mask. Make sure your mask covers your nose and mouth. ? Avoid crowded indoor spaces. Stay at  least 6 feet (2 meters) away from others. ? Disinfect objects and surfaces that are frequently touched every day. This may include:  Counters and tables.  Doorknobs and light switches.  Sinks and faucets.  Electronics, such as phones, remote controls, keyboards, computers, and tablets. To protect others: If you have symptoms of COVID-19, take steps to prevent the virus from spreading to others.  If you think you have a COVID-19 infection, contact your health care provider right away. Tell your health care team that you think you may have a COVID-19 infection.  Stay home. Leave your house only to seek medical care. Do not use public transport.  Do not travel while you are sick.  Wash your hands often with soap and water for 20 seconds. If soap and water are not available, use alcohol-based hand sanitizer.  Stay away from other members of your household. Let healthy household members care for children and pets, if possible. If you have to care for children or pets, wash your hands often and wear a mask. If possible, stay in your own room, separate from others. Use a different bathroom.  Make sure that all people in your household wash their hands well and often.  Cough or sneeze into a tissue or your sleeve or elbow. Do not cough or sneeze into your hand or into the air.  Wear a cloth face covering or face mask. Make sure your mask covers your nose and mouth. Where to find more information  Centers for Disease Control and Prevention:  PurpleGadgets.be  World Health Organization: https://www.castaneda.info/ Contact a health care provider if:  You live in or have traveled to an area where COVID-19 is a risk and you have symptoms of the infection.  You have had contact with someone who has COVID-19 and you have symptoms of the infection. Get help right away if:  You have trouble breathing.  You have pain or pressure in your chest.  You have confusion.  You have bluish lips and fingernails.  You have difficulty waking from sleep.  You have symptoms that get worse. These symptoms may represent a serious problem that is an emergency. Do not wait to see if the symptoms will go away. Get medical help right away. Call your local emergency services (911 in the [place] Do not drive yourself to the hospital. Let the emergency medical personnel know if you think you have COVID-19. Summary  COVID-19 is a respiratory infection that is caused by a virus. It is also known as coronavirus disease or novel coronavirus. It can cause serious infections, such as pneumonia, acute respiratory distress syndrome, acute respiratory failure, or sepsis.  The virus that causes COVID-19 is contagious. This means that it can spread from person to person through droplets from breathing, speaking, singing, coughing, or sneezing.  You are more likely to develop a serious illness if you are 25 years of age or older, have a weak immune system, live in a nursing home, or have chronic disease.  There is no medicine to treat COVID-19. Your health care provider will talk with you about ways to treat your symptoms.  Take steps to protect yourself and others from infection. Wash your hands often and disinfect objects and surfaces that are frequently touched every day. Stay away from people who are sick and wear a mask if you are sick. This information is not intended to replace advice given to you by your health care  provider. Make sure you discuss any questions you have  with your health care provider. Document Revised: 11/16/2018 Document Reviewed: 02/22/2018 Elsevier Patient Education  [place].

## 2019-03-07 NOTE — Assessment & Plan Note (Signed)
Plan: Repeat CT in June/2021 Emphasized need for the patient to stop smoking marijuana

## 2019-03-07 NOTE — Assessment & Plan Note (Signed)
Plan: Continue Symbicort 80 We will get you rescheduled for pulmonary function testing

## 2019-03-07 NOTE — Progress Notes (Signed)
Virtual Visit via Telephone Note  I connected with Catherine Osborne on 03/07/19 at  2:00 PM EST by telephone and verified that I am speaking with the correct person using two identifiers.  Location: Patient: Home Provider: [facility] - [id number] [place], [place], [place] [address]   I discussed the limitations, risks, security and privacy concerns of performing an evaluation and management service by telephone and the availability of in person appointments. I also discussed with the patient that there may be a patient responsible charge related to this service. The patient expressed understanding and agreed to proceed.  Patient consented to consult via telephone: Yes People present and their role in pt care: Pt    History of Present Illness:  44 year old female current every day marijuana smoker (was 31, now 5 blunts a day) and every day illicit drug user, former cigarette smoker (quit January/2020).  Patient also with recent CT abdomen in January/2020 which showed a 6 mm right lower lobe nodule.  Followed in our office for asthma.  PMH: Substance abuse history, depression, allergies,  Smoker/ Smoking History: Former Smoker. 1 ppd. 15 pack year.  Maintenance: Symbicort 80 Pt of: Dr. Halford Chessman   Chief complaint: recently positive covid tested   44 year old female former smoker.  Current marijuana smoker.  She was initially scheduled today to have a pulmonary function test.  When she was screened prior to pulmonary function testing she was found to be positive for Covid.  She reports that her symptoms started on 03/04/2019 with a loss of sense of smell.  Overall she has been tolerating Covid well.  She does not have any current fevers.  She believes that she contracted Covid the same time her sister did at [employer].  Her sister is also positive for Covid.  She is monitoring her temperatures as well as her symptoms.  Observations/Objective:  03/04/19 - Sarscov2 - positive    Pulmonary tests:  Spirometry 11/04/13 >> FEV1 1.86 (76%), FEV1% 64 Allergy test 11/04/13 >> dust mite, cockroach, dogs, trees  Chest imaging:  CT chest 10/05/18 >> b/l irregular nodules up to 2.3 cm CT chest 01/11/19 >> b/l irregular nodules up to 2.1 cm   Social History   Tobacco Use  Smoking Status Former Smoker  . Packs/day: 0.75  . Years: 20.00  . Pack years: 15.00  . Types: Cigarettes  . Quit date: 01/31/2018  . Years since quitting: 1.0  Smokeless Tobacco Never Used  Tobacco Comment   marijuana trying to decrease from 20 blunts day to 5 now   Immunization History  Administered Date(s) Administered  . Influenza,inj,Quad PF,6+ Mos 10/31/2016, 01/03/2018, 11/15/2018  . Tdap 06/13/2015    Assessment and Plan:  Renal mass Patient is to follow-up with primary care and nephrology  COVID-19 virus detected Tested positive for Covid on 03/04/2019  Reviewed symptomatic management Reviewed symptoms to monitor Supportive measures at this point in time  Patient knows to contact our office if symptoms worsen, or if she has additional questions or concerns Ordered MyChart home monitoring the patient reports that she is not comfortable using MyChart  Asthma Plan: Continue Symbicort 80 We will get you rescheduled for pulmonary function testing  Abnormal findings on diagnostic imaging of lung Plan: Repeat CT in June/2021 Emphasized need for the patient to stop smoking marijuana   Follow Up Instructions:  Return in about 3 months (around 06/04/2019), or if symptoms worsen or fail to improve, for Follow up for PFT, Follow  up with Dr. Halford Chessman.   I discussed the assessment and treatment plan with the patient. The patient was provided an opportunity to ask questions and all were answered. The patient agreed with the plan and demonstrated an understanding of the instructions.   The patient was advised to call back or seek an in-person evaluation if the symptoms worsen or if the  condition fails to improve as anticipated.  I provided 23 minutes of non-face-to-face time during this encounter.   Lauraine Rinne, NP

## 2019-03-27 ENCOUNTER — Telehealth: Payer: Self-pay | Admitting: Pulmonary Disease

## 2019-03-27 NOTE — Telephone Encounter (Signed)
Catherine Osborne,   This patient is on your schedule for a PFT and OV same day on 04/03/19.  However, the covid screen was cancelled due to positive diagnosis on 03/04/19.  I called the patient to let her know her message was received. She stated that since her breathing has not improved and summer will be coming soon, she is worried that her breathing will get worse.  Based on her history and the positive covid test on 03/04/19, should she still have the PFT with same day visit on 04/03/19? OR should she be tested again in order to have the PFT?  Please advise, thank you.

## 2019-03-27 NOTE — Telephone Encounter (Signed)
Catherine Osborne,  I can work on getting her scheduled for the covid test, but if she tests positive again, can she still have the PFT?

## 2019-03-27 NOTE — Telephone Encounter (Signed)
She will need to be tested again for covid before PFT

## 2019-03-29 NOTE — Telephone Encounter (Signed)
Catherine Osborne,   Should I proceed and schedule her for the repeat covid test on Saturday? The PFT was scheduled for Wednesday 04/03/19.

## 2019-03-29 NOTE — Telephone Encounter (Signed)
We haven't update our policy on this, ill have to discuss with TD or Dr. Halford Chessman if this happens

## 2019-03-29 NOTE — Telephone Encounter (Signed)
Beth, I called the patient to make her aware you wanted her to have the covid screen done on Saturday.  She has been scheduled.  Wanted to make you aware that when I told her not to any of her respiratory medications that morning, she also asked if she can still smoke. I told her no. I told her that she will not have an accurate test result and not be able to finish the test if she does that prior.  Sent to you as an fyi.

## 2019-03-29 NOTE — Telephone Encounter (Signed)
Yes

## 2019-03-30 ENCOUNTER — Ambulatory Visit (HOSPITAL_COMMUNITY)
Admission: EM | Admit: 2019-03-30 | Discharge: 2019-03-30 | Disposition: A | Discharge status: Home / Self Care | Payer: Medicaid Other | Attending: Emergency Medicine | Admitting: Emergency Medicine

## 2019-03-30 ENCOUNTER — Other Ambulatory Visit: Payer: Self-pay

## 2019-03-30 ENCOUNTER — Other Ambulatory Visit (HOSPITAL_COMMUNITY): Payer: Medicaid Other

## 2019-03-30 ENCOUNTER — Encounter (HOSPITAL_COMMUNITY): Payer: Self-pay

## 2019-03-30 DIAGNOSIS — N61 Mastitis without abscess: Secondary | ICD-10-CM | POA: Diagnosis present

## 2019-03-30 DIAGNOSIS — B9689 Other specified bacterial agents as the cause of diseases classified elsewhere: Secondary | ICD-10-CM | POA: Diagnosis present

## 2019-03-30 DIAGNOSIS — Z3202 Encounter for pregnancy test, result negative: Secondary | ICD-10-CM | POA: Diagnosis not present

## 2019-03-30 DIAGNOSIS — N760 Acute vaginitis: Secondary | ICD-10-CM | POA: Diagnosis not present

## 2019-03-30 LAB — POCT URINALYSIS DIP (DEVICE)
Bilirubin Urine: NEGATIVE
Glucose, UA: NEGATIVE mg/dL
Ketones, ur: NEGATIVE mg/dL
Leukocytes,Ua: NEGATIVE
Nitrite: NEGATIVE
Protein, ur: NEGATIVE mg/dL
Specific Gravity, Urine: 1.02 (ref 1.005–1.030)
Urobilinogen, UA: 0.2 mg/dL (ref 0.0–1.0)
pH: 7 (ref 5.0–8.0)

## 2019-03-30 LAB — POCT PREGNANCY, URINE: Preg Test, Ur: NEGATIVE

## 2019-03-30 LAB — POC URINE PREG, ED: Preg Test, Ur: NEGATIVE

## 2019-03-30 MED ORDER — DOXYCYCLINE HYCLATE 100 MG PO CAPS: 100.0000 mg | ORAL_CAPSULE | Freq: Two times a day (BID) | ORAL | 0 refills | Status: AC

## 2019-03-30 NOTE — Discharge Instructions (Addendum)
Finish the doxycycline for the infection in your nipple, even if you feel better.  Warm compresses.  May take 600 mg of ibuprofen combined with 1000 mg of Tylenol 3-4 times a day as needed for pain.  Finish the Flagyl, even if your symptoms resolve.  Refrain from intercourse until your symptoms have resolved and you know what your labs are.  We will call you only if they are positive and will call in the appropriate treatment.  It will take 2 to 3 days for your labs to come back.

## 2019-03-30 NOTE — ED Provider Notes (Signed)
HPI  SUBJECTIVE:  Catherine Osborne is a 44 y.o. female who presents with 2 issues:.  First she reports bilateral nipple pain described as irritation, sensitive, tenderness.  She reports a painful erythematous "bump" on the top of her right nipple.  It has not changed in size since it started.  She reports nipple swelling.  No trauma to the nipples, she is not breast-feeding.  No nipple discharge, body aches, fevers, breast swelling, breast tenderness.  She has tried hot showers without improvement in symptoms.  Symptoms are worse with palpation, and having her bra rubbing against her nipples.  She had similar symptoms before 2 years ago, which was thought to be due to a skin infection..  She had a abnormal mammogram last year but this was going to be monitored through annual mammograms.    Second, she also reports abnormal vaginal odor for the past 2 days.  No vaginal discharge, bleeding, rash, itching, urinary complaints.  No abdominal, back, pelvic pain.  She is in a long-term monogamous relationship with a female who is asymptomatic, STDs are not a concern today.  Medical history of Covid, MS, frequent nipple infections, eczema, frequent BV, HSV.  No history of MRSA, gonorrhea, chlamydia, HIV, HSV, syphilis, trichomonas, yeast.  LMP: End of last month.  States that it was abnormal.  PMD: Cannot remember name.   Past Medical History:  Diagnosis Date  . Allergy   . Asthma   . Headache   . 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 Pneumonia   . Prior miscarriage with pregnancy in first trimester, antepartum   . Vaginal Pap smear, abnormal     Past Surgical History:  Procedure Laterality Date  . CERVICAL CERCLAGE N/A 02/26/2015   Procedure: CERCLAGE CERVICAL;  Surgeon: Frederico Hamman, MD;  Location: [facility];  Service: Gynecology;  Laterality: N/A;  . DILATION AND CURETTAGE OF UTERUS    . DILATION AND EVACUATION N/A 05/22/2012   Procedure: DILATATION AND EVACUATION;  Surgeon: Frederico Hamman, MD;   Location: [facility];  Service: Gynecology;  Laterality: N/A;  . FOOT SURGERY      Family History  Adopted: Yes  Problem Relation Age of Onset  . Bone cancer Mother   . Breast cancer Maternal Grandmother   . Diabetes Other   . Hypertension Other   . Stroke Other   . Heart attack Other     Social History   Tobacco Use  . Smoking status: Former Smoker    Packs/day: 0.75    Years: 20.00    Pack years: 15.00    Types: Cigarettes    Quit date: 01/31/2018    Years since quitting: 1.1  . Smokeless tobacco: Never Used  . Tobacco comment: marijuana trying to decrease from 20 blunts day to 5 now  Substance Use Topics  . Alcohol use: Not Currently    Alcohol/week: 1.0 standard drinks    Types: 1 Glasses of wine per week    Comment: rare - no liquor since april 2020/ drinks wine   . Drug use: Yes    Frequency: 210.0 times per week    Types: Marijuana    Comment: 20 blunts a day now to 5 a day - trying to quit    No current facility-administered medications for this encounter.  Current Outpatient Medications:  .  BREO ELLIPTA 100-25 MCG/INH AEPB, Inhale 1 puff into the lungs daily., Disp: 28 each, Rfl: 5 .  docusate sodium (COLACE) 100 MG capsule, Take  1 capsule (100 mg total) by mouth 2 (two) times daily., Disp: 60 capsule, Rfl: 11 .  doxycycline (VIBRAMYCIN) 100 MG capsule, Take 1 capsule (100 mg total) by mouth 2 (two) times daily for 7 days., Disp: 14 capsule, Rfl: 0 .  hydrocortisone (ANUSOL-HC) 25 MG suppository, Place 1 suppository (25 mg total) rectally 2 (two) times daily., Disp: 12 suppository, Rfl: 11 .  polyethylene glycol (MIRALAX / GLYCOLAX) 17 g packet, Take 17 g by mouth at bedtime., Disp: 30 each, Rfl: 11 .  sertraline (ZOLOFT) 100 MG tablet, Take 100 mg by mouth daily., Disp: , Rfl:  .  tinidazole (TINDAMAX) 500 MG tablet, Take 2 tablets (1,000 mg total) by mouth daily with breakfast. (Patient not taking: Reported on 03/01/2019), Disp: 10 tablet, Rfl: 2 .  Vitamin D,  Ergocalciferol, (DRISDOL) 1.25 MG (50000 UT) CAPS capsule, One capsule by mouth twice per week, Disp: 24 capsule, Rfl: 0  No Known Allergies   ROS  As noted in HPI.   Physical Exam  BP 105/80 (BP Location: Right Arm)   Pulse 73   Temp 98.6 F (37 C) (Oral)   Resp 16   LMP 03/08/2019   SpO2 99%   Constitutional: Well developed, well nourished, no acute distress Eyes:  EOMI, conjunctiva normal bilaterally HENT: Normocephalic, atraumatic,mucus membranes moist Respiratory: Normal inspiratory effort Cardiovascular: Normal rate Breasts: Breasts symmetric.  Positive protruding nipples bilaterally.  No expressible drainage from the nipples bilaterally.  Erythematous tender mass at the top of the right nipple and some tenderness in the right lower quadrant of the right breast.  No overlying erythema, skin changes.  No induration.  No masses felt in either breast.  Patient declined chaperone Right nipple:   Left nipple:    Lymph: No supraclavicular, axillary lymphadenopathy  nondistended skin: No rash, skin intact Musculoskeletal: no deformities Neurologic: Alert & oriented x 3, no focal neuro deficits Psychiatric: Speech and behavior appropriate   ED Course   Medications - No data to display  Orders Placed This Encounter  Procedures  . POC urine pregnancy    Standing Status:   Standing    Number of Occurrences:   1  . POCT urinalysis dip (device)    Standing Status:   Standing    Number of Occurrences:   1  . Pregnancy, urine POC    Standing Status:   Standing    Number of Occurrences:   1    No results found for this or any previous visit (from the past 24 hour(s)). No results found.  ED Clinical Impression  1. Nipple infection   2. BV (bacterial vaginosis)      ED Assessment/Plan  1.  Nipple infection/cellulitis.  She does have tenderness in the right breast but there is no evidence of mastitis.  Will send home with doxycycline for 7 days.  Warm  compresses.  Patient states she does not need a prescription for pain medication.  She will need to follow-up with the breast center or with her primary care physician.  2.  Vaginal odor.  Patient is not pregnant.  No UTI.  Suspect recurrent BV.  Gonorrhea chlamydia trichomonas BV yeast sent.  Patient declined HIV and syphilis testing.  Home with Flagyl for a week.  Discussed labs,MDM, treatment plan, and plan for follow-up with patient. Discussed sn/sx that should prompt return to the ED. patient agrees with plan.   Meds ordered this encounter  Medications  . doxycycline (VIBRAMYCIN) 100 MG capsule  Sig: Take 1 capsule (100 mg total) by mouth 2 (two) times daily for 7 days.    Dispense:  14 capsule    Refill:  0    *This clinic note was created using [employer]. Therefore, there may be occasional mistakes despite careful proofreading.   ?   Melynda Ripple, MD 04/01/19 [phone]

## 2019-03-30 NOTE — Progress Notes (Deleted)
Pt tested positive on 03/05/19. Re-testing will not be required per guidelines.   Jacqlyn Larsen, RN    Status:  Final result Visible to patient:  Yes (MyChart) Next appt:  04/03/2019 at 11:00 AM in Pulmonology ([facility]) Specimen Information: Nasopharyngeal Swab      Ref Range & Units 3 wk ago  SARS Coronavirus 2 NEGATIVE POSITIVEAbnormal    Comment: (NOTE)  SARS-CoV-2 target nucleic acids are DETECTED.

## 2019-04-01 ENCOUNTER — Encounter: Payer: Self-pay | Admitting: Obstetrics

## 2019-04-01 ENCOUNTER — Other Ambulatory Visit: Payer: Self-pay | Admitting: Obstetrics

## 2019-04-01 ENCOUNTER — Telehealth (INDEPENDENT_AMBULATORY_CARE_PROVIDER_SITE_OTHER): Payer: Medicaid Other | Admitting: Obstetrics

## 2019-04-01 DIAGNOSIS — N61 Mastitis without abscess: Secondary | ICD-10-CM

## 2019-04-01 MED ORDER — IBUPROFEN 800 MG PO TABS: 800.0000 mg | ORAL_TABLET | Freq: Three times a day (TID) | ORAL | 5 refills | Status: DC | PRN

## 2019-04-01 NOTE — Progress Notes (Addendum)
TELEHEALTH VIRTUAL GYNECOLOGY VISIT ENCOUNTER NOTE  I connected with Catherine Osborne on 04/01/19 at  3:00 PM EST by telephone at home and verified that I am speaking with the correct person using two identifiers.   I discussed the limitations, risks, security and privacy concerns of performing an evaluation and management service by telephone and the availability of in person appointments. I also discussed with the patient that there may be a patient responsible charge related to this service. The patient expressed understanding and agreed to proceed.   History:  Catherine Osborne is a 44 y.o. [id number] female being evaluated today for nipple swelling, discharge, redness and pain.  She was seen in the ER and started on Duriceff. She denies any abnormal vaginal discharge, bleeding, pelvic pain or other concerns.       Past Medical History:  Diagnosis Date  . Allergy   . Asthma   . Headache   . 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 Pneumonia   . Prior miscarriage with pregnancy in first trimester, antepartum   . Vaginal Pap smear, abnormal    Past Surgical History:  Procedure Laterality Date  . CERVICAL CERCLAGE N/A 02/26/2015   Procedure: CERCLAGE CERVICAL;  Surgeon: Frederico Hamman, MD;  Location: [facility];  Service: Gynecology;  Laterality: N/A;  . DILATION AND CURETTAGE OF UTERUS    . DILATION AND EVACUATION N/A 05/22/2012   Procedure: DILATATION AND EVACUATION;  Surgeon: Frederico Hamman, MD;  Location: [facility];  Service: Gynecology;  Laterality: N/A;  . FOOT SURGERY     The following portions of the patient's history were reviewed and updated as appropriate: allergies, current medications, past family history, past medical history, past social history, past surgical history and problem list.   Health Maintenance:  Normal pap and negative HRHPV on 12-12-2018.  Normal mammogram on 12-27-2017.   Review of Systems:  Pertinent items noted in HPI and remainder of comprehensive ROS  otherwise negative.  Physical Exam:   General:  Alert, oriented and cooperative.   Mental Status: Normal mood and affect perceived. Normal judgment and thought content.  Physical exam deferred due to nature of the encounter  Labs and Imaging Results for orders placed or performed during the hospital encounter of 03/30/19 (from the past 336 hour(s))  POCT urinalysis dip (device)   Collection Time: 03/30/19  3:33 PM  Result Value Ref Range   Glucose, UA NEGATIVE NEGATIVE mg/dL   Bilirubin Urine NEGATIVE NEGATIVE   Ketones, ur NEGATIVE NEGATIVE mg/dL   Specific Gravity, Urine 1.020 1.005 - 1.030   Hgb urine dipstick TRACE (A) NEGATIVE   pH 7.0 5.0 - 8.0   Protein, ur NEGATIVE NEGATIVE mg/dL   Urobilinogen, UA 0.2 0.0 - 1.0 mg/dL   Nitrite NEGATIVE NEGATIVE   Leukocytes,Ua NEGATIVE NEGATIVE  POC urine pregnancy   Collection Time: 03/30/19  3:35 PM  Result Value Ref Range   Preg Test, Ur NEGATIVE NEGATIVE  Pregnancy, urine POC   Collection Time: 03/30/19  3:35 PM  Result Value Ref Range   Preg Test, Ur NEGATIVE NEGATIVE   No results found.    Assessment and Plan:     1. Mastitis, acute with Dermatitis of nipples Rx: - MM DIAG BREAST TOMO BILATERAL; Future - ibuprofen (ADVIL) 800 MG tablet; Take 1 tablet (800 mg total) by mouth every 8 (eight) hours as needed.  Dispense: 30 tablet; Refill: 5 - Doxycycline 100 mg po bid x 10 days  I discussed the assessment and treatment plan with the patient. The patient was provided an opportunity to ask questions and all were answered. The patient agreed with the plan and demonstrated an understanding of the instructions.   The patient was advised to call back or seek an in-person evaluation/go to the ED if the symptoms worsen or if the condition fails to improve as anticipated.  I provided 15 minutes of non-face-to-face time during this encounter.   Baltazar Najjar, MD [facility] [place]  [facility] 04/01/2019

## 2019-04-01 NOTE — Progress Notes (Signed)
Pt is on the phone for a televisit for follow up from ED on 03/30/19. Pt went to ED for nipple discharge and swelling on both sides. Providers in ED started her on Doxycycline for 7 days. Pt reports she is taking this.

## 2019-04-02 ENCOUNTER — Other Ambulatory Visit: Payer: Self-pay | Admitting: Obstetrics

## 2019-04-02 ENCOUNTER — Telehealth (HOSPITAL_COMMUNITY): Payer: Self-pay | Admitting: Emergency Medicine

## 2019-04-02 LAB — CERVICOVAGINAL ANCILLARY ONLY
Bacterial vaginitis: POSITIVE — AB
Candida vaginitis: NEGATIVE
Chlamydia: NEGATIVE
Neisseria Gonorrhea: NEGATIVE
Trichomonas: NEGATIVE

## 2019-04-02 MED ORDER — METRONIDAZOLE 500 MG PO TABS: 500.0000 mg | ORAL_TABLET | Freq: Two times a day (BID) | ORAL | 0 refills | Status: AC

## 2019-04-02 NOTE — Telephone Encounter (Signed)
Bacterial vaginosis is positive. Pt needs treatment. Flagyl 500 mg BID x 7 days #14 no refills sent to patients pharmacy of choice.    Patient contacted by phone and made aware of    results. Pt verbalized understanding and had all questions answered.    

## 2019-04-03 ENCOUNTER — Other Ambulatory Visit: Payer: Self-pay

## 2019-04-03 ENCOUNTER — Ambulatory Visit: Payer: Medicaid Other | Admitting: Primary Care

## 2019-04-03 ENCOUNTER — Encounter: Payer: Self-pay | Admitting: Primary Care

## 2019-04-03 VITALS — BP 106/62 | HR 80 | Temp 97.1°F | Ht 62.0 in | Wt 144.4 lb

## 2019-04-03 DIAGNOSIS — J4520 Mild intermittent asthma, uncomplicated: Secondary | ICD-10-CM

## 2019-04-03 DIAGNOSIS — R918 Other nonspecific abnormal finding of lung field: Secondary | ICD-10-CM | POA: Diagnosis not present

## 2019-04-03 MED ORDER — MONTELUKAST SODIUM 10 MG PO TABS: 10.0000 mg | ORAL_TABLET | Freq: Every day | ORAL | 11 refills | Status: DC

## 2019-04-03 NOTE — Progress Notes (Signed)
@Patient  ID: Catherine Osborne, female    DOB: 1975/11/04, 44 y.o.   MRN: [id number]  Chief Complaint  Patient presents with  . Follow-up    Referring provider: Care, Lenord Carbo*  HPI: 44 year old female, recent former smoker (quit 01/31/18). PMH significant for asthma, mild airway obstruction. Previous patient of Dr. Lenna Gilford, last seen by pulmonary NP on 03/06/18 after testing positive for Covid. She has both Breo 100 and Symbicort 80 at home.   Previous [facility] pulmonary encounter: 04/03/2018 Patient presents today with complaints of shortness of breath.  Accompanied by her younger son. She was recently seen on 2/29 in ED and again on 3/2 at [facility] for URI symptoms. Supportive care recommended and given tessalon perles for cough and proair rescue inhaler.  Complains of cough, shortness of breath and occasional wheezing x1 week. Cough is occasionally productive. Associated chills, sweats and diarrhea. She stopped smoking in January. Reports that her two sons and their father were recently sick. No confirmed influenza or travel. Only using her Breo as needed, has not used recently but reports that she has it in her car. Works at [employer], around children and needs note to be out of work.   04/03/2019 Patient presents today for regular follow-up with PFTs. She was recently dx with COVID-19 on 03/04/19. Plan hold off on pulmonary function testing until 3 months. States that her breathing is worse in the heat, otherwise her asthma symptoms are well controlled. She sleeps with a fan at night. She prefers BREO inhaler to other types. She uses her albuterol rescue inhaler a few times a month. She is currently on an antibiotic right now for mastitis. She is due for repeat CT chest in June 2021 to follow up on multiple pulmonary nodules. She is still smoking marijuana. Denies cough, wheezing, chest tightness, chest pain, fatigue, weight loss, N/V/D.    Testing reviewed: Spirometry1/09/2018- FVC 3.8  (132%), FEV1 2.0 (85%), ratio 53 Spirometry 11/04/13 >> FEV1 1.86 (76%), FEV1% 64 Allergy test 11/04/13 >> dust mite, cockroach, dogs, trees  Imaging: CT chest 10/05/18 >> b/l irregular nodules up to 2.3 cm CT chest 01/11/19 >> b/l irregular nodules up to 2.1 cm   No Known Allergies  Immunization History  Administered Date(s) Administered  . Influenza,inj,Quad PF,6+ Mos 10/31/2016, 01/03/2018, 11/15/2018  . Tdap 06/13/2015    Past Medical History:  Diagnosis Date  . Allergy   . Asthma   . Headache   . 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 Pneumonia   . Prior miscarriage with pregnancy in first trimester, antepartum   . Vaginal Pap smear, abnormal     Tobacco History: Social History   Tobacco Use  Smoking Status Former Smoker  . Packs/day: 0.75  . Years: 20.00  . Pack years: 15.00  . Types: Cigarettes  . Quit date: 01/31/2018  . Years since quitting: 1.1  Smokeless Tobacco Never Used  Tobacco Comment   marijuana trying to decrease from 20 blunts day to 5 now   Counseling given: Not Answered Comment: marijuana trying to decrease from 20 blunts day to 5 now   Outpatient Medications Prior to Visit  Medication Sig Dispense Refill  . BREO ELLIPTA 100-25 MCG/INH AEPB Inhale 1 puff into the lungs daily. 28 each 5  . docusate sodium (COLACE) 100 MG capsule Take 1 capsule (100 mg total) by mouth 2 (two) times daily. 60 capsule 11  . doxycycline (VIBRAMYCIN) 100 MG capsule Take 1 capsule (100 mg total)  by mouth 2 (two) times daily for 7 days. 14 capsule 0  . hydrocortisone (ANUSOL-HC) 25 MG suppository Place 1 suppository (25 mg total) rectally 2 (two) times daily. 12 suppository 11  . ibuprofen (ADVIL) 800 MG tablet Take 1 tablet (800 mg total) by mouth every 8 (eight) hours as needed. 30 tablet 5  . metroNIDAZOLE (FLAGYL) 500 MG tablet Take 1 tablet (500 mg total) by mouth 2 (two) times daily for 7 days. 14 tablet 0  . polyethylene glycol (MIRALAX / GLYCOLAX) 17 g packet Take 17  g by mouth at bedtime. 30 each 11  . sertraline (ZOLOFT) 100 MG tablet Take 100 mg by mouth daily.    Marland Kitchen tinidazole (TINDAMAX) 500 MG tablet Take 2 tablets (1,000 mg total) by mouth daily with breakfast. 10 tablet 2  . Vitamin D, Ergocalciferol, (DRISDOL) 1.25 MG (50000 UT) CAPS capsule One capsule by mouth twice per week 24 capsule 0   No facility-administered medications prior to visit.   Review of Systems  Review of Systems  Respiratory: Positive for shortness of breath. Negative for cough, chest tightness and wheezing.   Cardiovascular: Negative.     Physical Exam  BP 106/62 (BP Location: Left Arm, Cuff Size: Normal)   Pulse 80   Temp (!) 97.1 F (36.2 C) (Temporal)   Ht 5\' 2"  (1.575 m)   Wt 144 lb 6.4 oz (65.5 kg)   LMP 03/08/2019   SpO2 100% Comment: RA  BMI 26.41 kg/m  Physical Exam Constitutional:      Appearance: Normal appearance.  HENT:     Head: Normocephalic and atraumatic.     Mouth/Throat:     Comments: Deferred d/t masking Cardiovascular:     Rate and Rhythm: Normal rate and regular rhythm.  Pulmonary:     Effort: Pulmonary effort is normal.     Breath sounds: Normal breath sounds. No wheezing.     Comments: CTA; no resp distress or wheezing  Skin:    General: Skin is warm and dry.  Neurological:     General: No focal deficit present.     Mental Status: She is alert and oriented to person, place, and time. Mental status is at baseline.  Psychiatric:        Mood and Affect: Mood normal.        Behavior: Behavior normal.        Thought Content: Thought content normal.        Judgment: Judgment normal.     Comments: Pleasant, some anxiety      Lab Results:  CBC    Component Value Date/Time   WBC 6.2 11/16/2018 1146   WBC 7.9 09/28/2018 0248   RBC 5.28 11/16/2018 1146   RBC 5.03 09/28/2018 0248   HGB 15.4 11/16/2018 1146   HCT 45.7 11/16/2018 1146   PLT 318 11/16/2018 1146   MCV 87 11/16/2018 1146   MCH 29.2 11/16/2018 1146   MCH 29.4  09/28/2018 0248   MCHC 33.7 11/16/2018 1146   MCHC 33.4 09/28/2018 0248   RDW 13.1 11/16/2018 1146   LYMPHSABS 2.3 11/16/2018 1146   MONOABS 0.7 07/05/2018 2108   EOSABS 0.2 11/16/2018 1146   BASOSABS 0.1 11/16/2018 1146    BMET    Component Value Date/Time   NA 138 11/16/2018 1146   K 3.9 11/16/2018 1146   CL 103 11/16/2018 1146   CO2 23 11/16/2018 1146   GLUCOSE 78 11/16/2018 1146   GLUCOSE 98 09/28/2018 0248   BUN  10 11/16/2018 1146   CREATININE 0.68 11/16/2018 1146   CALCIUM 9.7 11/16/2018 1146   GFRNONAA 109 11/16/2018 1146   GFRAA 126 11/16/2018 1146    BNP No results found for: BNP  ProBNP No results found for: PROBNP  Imaging: No results found.   Assessment & Plan:   Asthma - Mostly stable interval, dx covid feb 2021. Asthma worse with heat.  - Continue Breo 100, take one puff daily (consider increasing to 200 if needed this summer); Albuterol hfa 2 puffs every 4-6 hours for breakthrough shortness of breath/wheezing - RX Singulair 10mg  at bedtime  - Encouraged patient to quit smoking marijuana (discussed counseling or psychiatry for management anxiety/stress/sleep- patient declined) - Orders: Needs PFTs in May/June 2021 - Follow-up: Dr. Halford Chessman in 3 months after testing   Abnormal findings on diagnostic imaging of lung - Multiple bilateral irregular pulmonary nodules; irregular nodules RLL measuring 64mm and 2.3 x 0.9 cm left apex. Usual appearance suggest inflammatory process  - Orders: Due for repeat CT chest in June 2021   Martyn Ehrich, NP 04/08/2019

## 2019-04-03 NOTE — Patient Instructions (Addendum)
  Recommendations: - Continue Breo 100, take one puff daily - If breathing before more labored this summer d.t heat consider increasing Breo  - Adding a medication at bedtime for allergies/asthma  - Use rescue inhaler 2 puffs every 4-6 hours for breakthrough shortness of breath/wheezing - Try and quit smoking marijuana as best as possible  - If you would like to see counselor or psychiatry for management anxiety/stress/sleep let us know and we can refer you   RX: - Singulair 10mg  at bedtime   Orders: - CT chest due in June 2021 - Needs PFTs in May/June with   Follow-up: - FU after all testing (Dr. Halford Chessman) in 3 months

## 2019-04-08 ENCOUNTER — Other Ambulatory Visit: Payer: Medicaid Other

## 2019-04-08 NOTE — Assessment & Plan Note (Signed)
-   Mostly stable interval, dx covid feb 2021. Asthma worse with heat.  - Continue Breo 100, take one puff daily (consider increasing to 200 if needed this summer); Albuterol hfa 2 puffs every 4-6 hours for breakthrough shortness of breath/wheezing - RX Singulair 10mg  at bedtime  - Encouraged patient to quit smoking marijuana (discussed counseling or psychiatry for management anxiety/stress/sleep- patient declined) - Orders: Needs PFTs in May/June 2021 - Follow-up: Dr. Halford Chessman in 3 months after testing

## 2019-04-08 NOTE — Assessment & Plan Note (Signed)
-   Multiple bilateral irregular pulmonary nodules; irregular nodules RLL measuring 19mm and 2.3 x 0.9 cm left apex. Usual appearance suggest inflammatory process  - Orders: Due for repeat CT chest in June 2021

## 2019-04-11 ENCOUNTER — Encounter: Payer: Self-pay | Admitting: Family Medicine

## 2019-04-11 ENCOUNTER — Ambulatory Visit: Payer: Medicaid Other | Attending: Family Medicine | Admitting: Family Medicine

## 2019-04-11 ENCOUNTER — Other Ambulatory Visit: Payer: Self-pay

## 2019-04-11 ENCOUNTER — Telehealth: Payer: Self-pay | Admitting: *Deleted

## 2019-04-11 VITALS — BP 112/77 | HR 84 | Temp 98.1°F | Resp 16 | Wt 143.0 lb

## 2019-04-11 DIAGNOSIS — H539 Unspecified visual disturbance: Secondary | ICD-10-CM

## 2019-04-11 DIAGNOSIS — Z09 Encounter for follow-up examination after completed treatment for conditions other than malignant neoplasm: Secondary | ICD-10-CM

## 2019-04-11 DIAGNOSIS — H538 Other visual disturbances: Secondary | ICD-10-CM | POA: Insufficient documentation

## 2019-04-11 DIAGNOSIS — R490 Dysphonia: Secondary | ICD-10-CM | POA: Diagnosis not present

## 2019-04-11 DIAGNOSIS — Z87891 Personal history of nicotine dependence: Secondary | ICD-10-CM | POA: Diagnosis not present

## 2019-04-11 DIAGNOSIS — N61 Mastitis without abscess: Secondary | ICD-10-CM | POA: Diagnosis present

## 2019-04-11 DIAGNOSIS — Z791 Long term (current) use of non-steroidal anti-inflammatories (NSAID): Secondary | ICD-10-CM | POA: Diagnosis not present

## 2019-04-11 DIAGNOSIS — E010 Iodine-deficiency related diffuse (endemic) goiter: Secondary | ICD-10-CM | POA: Diagnosis not present

## 2019-04-11 DIAGNOSIS — Z79899 Other long term (current) drug therapy: Secondary | ICD-10-CM | POA: Insufficient documentation

## 2019-04-11 DIAGNOSIS — R519 Headache, unspecified: Secondary | ICD-10-CM | POA: Diagnosis not present

## 2019-04-11 DIAGNOSIS — Z7951 Long term (current) use of inhaled steroids: Secondary | ICD-10-CM | POA: Insufficient documentation

## 2019-04-11 DIAGNOSIS — Z833 Family history of diabetes mellitus: Secondary | ICD-10-CM | POA: Insufficient documentation

## 2019-04-11 DIAGNOSIS — J45909 Unspecified asthma, uncomplicated: Secondary | ICD-10-CM | POA: Insufficient documentation

## 2019-04-11 DIAGNOSIS — Z803 Family history of malignant neoplasm of breast: Secondary | ICD-10-CM | POA: Insufficient documentation

## 2019-04-11 DIAGNOSIS — Z8249 Family history of ischemic heart disease and other diseases of the circulatory system: Secondary | ICD-10-CM | POA: Insufficient documentation

## 2019-04-11 MED ORDER — DOXYCYCLINE HYCLATE 100 MG PO TABS: 100.0000 mg | ORAL_TABLET | Freq: Two times a day (BID) | ORAL | 0 refills | Status: AC

## 2019-04-11 NOTE — Patient Instructions (Signed)
Hoarseness  Hoarseness, also called dysphonia, is any abnormal change in your voice that can make it difficult to speak. Your voice may sound raspy, breathy, or strained. Hoarseness is caused by a problem with your vocal cords (vocal folds). These are two bands of tissue inside your voice box (larynx). When you speak, your vocal cords move back and forth to create sound. The surfaces of your vocal cords need to be smooth for your voice to sound clear. Swelling or lumps on your vocal cords can cause hoarseness. Common causes of vocal cord problems include:  Infection in the nose, throat, and upper air passages (upper respiratory infection).  A long-term cough.  Straining or overusing your voice.  Smoking, or exposure to secondhand smoke.  Allergies.  Medication side effects.  Vocal cord growths.  Vocal cord injuries.  Stomach acids that move up in your throat and irritate your vocal cords (gastroesophageal reflux).  Diseases that affect the nervous system, such as a stroke or Parkinson's disease. Follow these instructions at home: Watch your condition for any changes. To ease discomfort and protect your vocal cords:  Rest your voice.  Do not whisper. Whispering can cause muscle strain.  Do not speak in a loud or harsh voice.  Avoid coughing or clearing your throat.  Do not use any products that contain nicotine or tobacco, such as cigarettes and e-cigarettes. If you need help quitting, ask your health care provider.  Avoid secondhand smoke.  Do not eat foods that give you heartburn, such as spicy or acidic foods like hot peppers and orange juice. Heartburn can make gastroesophageal reflux worse.  Do not drink beverages that contain caffeine (coffee, tea, or soft drinks) or alcohol (beer, wine, or liquor).  Drink enough fluid to keep your urine pale yellow.  Use a humidifier if the air in your home is dry. If recommended by your health care provider, schedule an  appointment with a speech-language specialist. This specialist may give you methods to try that can help you avoid misusing your voice. Contact a health care provider if:  You have hoarseness that lasts longer than 3 weeks.  You almost lose or completely lose your voice for more than 3 days.  You have pain when you swallow or try to talk.  You feel a lump in your neck. Get help right away if:  You have trouble swallowing.  You feel like you are choking when you swallow.  You cough up blood or vomit blood.  You have trouble breathing.  You choke, cannot swallow, or cannot breathe if you lie flat.  You notice swelling or a rash on your body, face, or tongue. Summary  Hoarseness, also called dysphonia, is any abnormal change in your voice that can make it difficult to speak. Your voice may sound raspy, breathy, or strained.  Hoarseness is caused by a problem with your vocal cords (vocal folds).  Do not speak in a loud or harsh voice, use nicotine or tobacco products, or eat foods that give you heartburn.  If recommended by your health care provider, meet with a speech-language specialist. This information is not intended to replace advice given to you by your health care provider. Make sure you discuss any questions you have with your health care provider. Document Revised: 12/30/2016 Document Reviewed: 10/14/2016 Elsevier Patient Education  [place] Headache Without Cause A headache is pain or discomfort that is felt around the head or neck area. There are many causes and types  of headaches. In some cases, the cause may not be found. Follow these instructions at home: Watch your condition for any changes. Let your doctor know about them. Take these steps to help with your condition: Managing pain      Take over-the-counter and prescription medicines only as told by your doctor.  Lie down in a dark, quiet room when you have a headache.  If told, put  ice on your head and neck area: ? Put ice in a plastic bag. ? Place a towel between your skin and the bag. ? Leave the ice on for 20 minutes, 2-3 times per day.  If told, put heat on the affected area. Use the heat source that your doctor recommends, such as a moist heat pack or a heating pad. ? Place a towel between your skin and the heat source. ? Leave the heat on for 20-30 minutes. ? Remove the heat if your skin turns bright red. This is very important if you are unable to feel pain, heat, or cold. You may have a greater risk of getting burned.  Keep lights dim if bright lights bother you or make your headaches worse. Eating and drinking  Eat meals on a regular schedule.  If you drink alcohol: ? Limit how much you use to:  0-1 drink a day for women.  0-2 drinks a day for men. ? Be aware of how much alcohol is in your drink. In the [place] one drink equals one 12 oz bottle of beer (355 mL), one 5 oz glass of wine (148 mL), or one 1 oz glass of hard liquor (44 mL).  Stop drinking caffeine, or reduce how much caffeine you drink. General instructions   Keep a journal to find out if certain things bring on headaches. For example, write down: ? What you eat and drink. ? How much sleep you get. ? Any change to your diet or medicines.  Get a massage or try other ways to relax.  Limit stress.  Sit up straight. Do not tighten (tense) your muscles.  Do not use any products that contain nicotine or tobacco. This includes cigarettes, e-cigarettes, and chewing tobacco. If you need help quitting, ask your doctor.  Exercise regularly as told by your doctor.  Get enough sleep. This often means 7-9 hours of sleep each night.  Keep all follow-up visits as told by your doctor. This is important. Contact a doctor if:  Your symptoms are not helped by medicine.  You have a headache that feels different than the other headaches.  You feel sick to your stomach (nauseous) or you throw up  (vomit).  You have a fever. Get help right away if:  Your headache gets very bad quickly.  Your headache gets worse after a lot of physical activity.  You keep throwing up.  You have a stiff neck.  You have trouble seeing.  You have trouble speaking.  You have pain in the eye or ear.  Your muscles are weak or you lose muscle control.  You lose your balance or have trouble walking.  You feel like you will pass out (faint) or you pass out.  You are mixed up (confused).  You have a seizure. Summary  A headache is pain or discomfort that is felt around the head or neck area.  There are many causes and types of headaches. In some cases, the cause may not be found.  Keep a journal to help find out what causes your headaches.  Watch your condition for any changes. Let your doctor know about them.  Contact a doctor if you have a headache that is different from usual, or if your headache is not helped by medicine.  Get help right away if your headache gets very bad, you throw up, you have trouble seeing, you lose your balance, or you have a seizure. This information is not intended to replace advice given to you by your health care provider. Make sure you discuss any questions you have with your health care provider. Document Revised: 08/07/2017 Document Reviewed: 08/07/2017 Elsevier Patient Education  [place].

## 2019-04-11 NOTE — Progress Notes (Signed)
Subjective:  [name], female    DOB: 1975/05/29  Age: 44 y.o. MRN: [id number]  CC: Follow-up   HPI Catherine Osborne, 44 year old female, who is status post urgent care visit on 03/30/2019 due to complaint of bilateral nipple pain with irritation and tenderness as well as an enlarged bump on the top of the right nipple.  She also had complaint of abnormal vaginal odor.  She was diagnosed with right nipple infection/cellulitis for which she was placed on doxycycline x7 days.  She was told to take metronidazole that she already had at home for possible BV.  Cervicovaginal ancillary testing was positive for bacterial vaginitis.       At today's visit, patient reports that the boil on her right nipple eventually came to ahead and burst.  She reports no current infection but still has some bilateral tenderness of both nipples.  She is currently under menses.  She does have an upcoming appointment with the breast center for further evaluation.  She also has complaint of recent onset of visual changes, especially at night, she feels as if she is seeing double images when she looks at street signs.  She has also had recent onset of headaches that are mostly at the left temple.  She feels that the temple area is tender to touch and the headaches are sharp and stabbing in nature.  She did have a history of migraines as a teenager which resolved.  Current headaches do not feel similar to prior migraine headaches.  Patient also has pictures of her recent nipple infection on her cell phone which patient showed to me at today's visit.  Past Medical History:  Diagnosis Date  . Allergy   . Asthma   . Headache   . 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 Pneumonia   . Prior miscarriage with pregnancy in first trimester, antepartum   . Vaginal Pap smear, abnormal     Past Surgical History:  Procedure Laterality Date  . CERVICAL CERCLAGE N/A 02/26/2015   Procedure: CERCLAGE CERVICAL;  Surgeon:  Frederico Hamman, MD;  Location: [facility];  Service: Gynecology;  Laterality: N/A;  . DILATION AND CURETTAGE OF UTERUS    . DILATION AND EVACUATION N/A 05/22/2012   Procedure: DILATATION AND EVACUATION;  Surgeon: Frederico Hamman, MD;  Location: [facility];  Service: Gynecology;  Laterality: N/A;  . FOOT SURGERY      Family History  Adopted: Yes  Problem Relation Age of Onset  . Bone cancer Mother   . Breast cancer Maternal Grandmother   . Diabetes Other   . Hypertension Other   . Stroke Other   . Heart attack Other     Social History   Tobacco Use  . Smoking status: Former Smoker    Packs/day: 0.75    Years: 20.00    Pack years: 15.00    Types: Cigarettes    Quit date: 01/31/2018    Years since quitting: 1.1  . Smokeless tobacco: Never Used  . Tobacco comment: marijuana trying to decrease from 20 blunts day to 5 now  Substance Use Topics  . Alcohol use: Not Currently    Alcohol/week: 1.0 standard drinks    Types: 1 Glasses of wine per week    Comment: rare - no liquor since april 2020/ drinks wine     ROS Review of Systems  Constitutional: Positive for fatigue. Negative for chills and fever.  HENT: Positive for voice change. Negative for sore  throat and trouble swallowing.   Eyes: Positive for photophobia and visual disturbance. Negative for pain and redness.  Respiratory: Negative for cough and shortness of breath.   Cardiovascular: Negative for chest pain, palpitations and leg swelling.  Gastrointestinal: Negative for abdominal pain, blood in stool, constipation, diarrhea and nausea.  Endocrine: Negative for cold intolerance, heat intolerance, polydipsia, polyphagia and polyuria.  Genitourinary: Negative for dysuria and frequency.  Musculoskeletal: Negative for arthralgias, back pain and gait problem.  Skin: Negative for rash and wound.  Neurological: Positive for headaches. Negative for dizziness.  Hematological: Negative for adenopathy. Does not bruise/bleed easily.      Objective:   Today's Vitals: BP 112/77 (BP Location: Left Arm, Patient Position: Sitting, Cuff Size: Normal)   Pulse 84   Temp 98.1 F (36.7 C)   Resp 16   Wt 143 lb (64.9 kg)   LMP 04/08/2019   SpO2 100%   BMI 26.16 kg/m   Physical Exam Vitals and nursing note reviewed.  Constitutional:      General: She is not in acute distress.    Appearance: Normal appearance.     Comments: Well-nourished well-developed female in no acute distress who is wearing a facemask as per office COVID-19 protocol.  Patient with a hoarse speaking voice.  Eyes:     General: No scleral icterus.    Extraocular Movements: Extraocular movements intact.     Conjunctiva/sclera: Conjunctivae normal.  Neck:     Vascular: No carotid bruit.  Cardiovascular:     Rate and Rhythm: Normal rate and regular rhythm.  Pulmonary:     Effort: Pulmonary effort is normal.     Breath sounds: Normal breath sounds.  Chest:     Breasts:        Right: Skin change (Mild area of erythema at the superior aspect the right nipple) present. No swelling, bleeding, inverted nipple, mass or nipple discharge.        Left: Normal. No swelling, bleeding, inverted nipple, mass, nipple discharge or skin change.  Abdominal:     Palpations: Abdomen is soft.     Tenderness: There is no abdominal tenderness. There is no right CVA tenderness, left CVA tenderness, guarding or rebound.  Musculoskeletal:     Cervical back: Normal range of motion and neck supple. No tenderness.  Lymphadenopathy:     Cervical: No cervical adenopathy.     Upper Body:     Right upper body: No supraclavicular, axillary or pectoral adenopathy.     Left upper body: No supraclavicular, axillary or pectoral adenopathy.  Skin:    General: Skin is warm and dry.  Neurological:     General: No focal deficit present.     Mental Status: She is alert and oriented to person, place, and time.     Cranial Nerves: No cranial nerve deficit.  Psychiatric:        Mood  and Affect: Mood normal.        Behavior: Behavior normal.     Assessment & Plan:  1. Nipple infection; 2.  Encounter for examination following treatment at hospital Patient's notes from her recent urgent care visit on 03/30/2019 reviewed and discussed with the patient.  She does not have any current tenderness of the nipples and no nipple discharge.  She does have mild erythema of the right superior nipple.  She has been provided with a new prescription for doxycycline in case she has recurrence of infection.  She reports that she is already scheduled to have  further imaging at the breast center within the next 2 weeks. - doxycycline (VIBRA-TABS) 100 MG tablet; Take 1 tablet (100 mg total) by mouth 2 (two) times daily for 7 days.  Dispense: 14 tablet; Refill: 0  3. Left temporal headache Patient reports that she is having new onset of left temporal headaches along with some difficulty with her vision.  Patient will have sed rate to make sure that she does not need further evaluation for temporal arteritis.  She will be referred to neurology in follow-up of her headaches.  She may take over-the-counter Excedrin Migraine to see if this helps with headaches in the interim. - Sedimentation Rate - Ambulatory referral to Neurology  4. Visual disturbance She reports that she has had recent onset of visual difficulty, especially with night vision.  In addition to her visual difficulty, she also reports new onset of left temporal headaches.  She has been referred to neurology in follow-up of new onset headaches and visual disturbance as well as referral to ophthalmology for further evaluation of her complaints with difficulty with night vision and possible double vision. - Ambulatory referral to Neurology - Ambulatory referral to Ophthalmology  5. Thyromegaly Patient with thyromegaly on exam she reports that she has been told in the past that she needs an ultrasound of her thyroid.  She will be scheduled  for thyroid ultrasound and will also have TSH at today's visit to look for abnormality of thyroid function.  She additionally has been referred to ENT in follow-up of thyromegaly and patient also with hoarse speaking voice - Ambulatory referral to ENT - TSH - US THYROID; Future  6. Hoarseness of voice Patient with hoarse speaking voice and she reports that this is a change from normal.  She will be referred to ENT for further evaluation and treatment - Ambulatory referral to ENT  Outpatient Encounter Medications as of 04/11/2019  Medication Sig  . BREO ELLIPTA 100-25 MCG/INH AEPB Inhale 1 puff into the lungs daily.  Marland Kitchen ibuprofen (ADVIL) 800 MG tablet Take 1 tablet (800 mg total) by mouth every 8 (eight) hours as needed.  . tinidazole (TINDAMAX) 500 MG tablet Take 2 tablets (1,000 mg total) by mouth daily with breakfast.  . Vitamin D, Ergocalciferol, (DRISDOL) 1.25 MG (50000 UT) CAPS capsule One capsule by mouth twice per week  . montelukast (SINGULAIR) 10 MG tablet Take 1 tablet (10 mg total) by mouth at bedtime.  . [DISCONTINUED] docusate sodium (COLACE) 100 MG capsule Take 1 capsule (100 mg total) by mouth 2 (two) times daily. (Patient not taking: Reported on 04/11/2019)  . [DISCONTINUED] hydrocortisone (ANUSOL-HC) 25 MG suppository Place 1 suppository (25 mg total) rectally 2 (two) times daily. (Patient not taking: Reported on 04/11/2019)  . [DISCONTINUED] polyethylene glycol (MIRALAX / GLYCOLAX) 17 g packet Take 17 g by mouth at bedtime. (Patient not taking: Reported on 04/11/2019)  . [DISCONTINUED] sertraline (ZOLOFT) 100 MG tablet Take 100 mg by mouth daily.   No facility-administered encounter medications on file as of 04/11/2019.    An After Visit Summary was printed and given to the patient.   Follow-up: An After Visit Summary was printed and given to the patient.    More than 40 minutes was spent reviewing chart, discussing history of present illness and review of systems with the  patient, performing examination, discussing assessment and treatment plan, placing orders and additional time spent completing today's encounter note.   Antony Blackbird MD

## 2019-04-11 NOTE — Telephone Encounter (Signed)
Prior authorization for Thyroid US- [id number] Authorization  Number - [id number] Schedules at [place]     [phone] Faxed information to [phone]  Appointment  March 19 @ 1:15  Patient called and information left on her voicemail.

## 2019-04-11 NOTE — Progress Notes (Signed)
Concerns with headache and worsening vision  Also mentioned having blister on nipples. States she completed last dose of  doxy for this yesterday.

## 2019-04-12 LAB — SEDIMENTATION RATE: Sed Rate: 27 mm/h (ref 0–32)

## 2019-04-12 LAB — TSH: TSH: 0.863 u[IU]/mL (ref 0.450–4.500)

## 2019-04-19 ENCOUNTER — Ambulatory Visit (HOSPITAL_COMMUNITY): Payer: Medicaid Other

## 2019-04-22 ENCOUNTER — Encounter: Payer: Self-pay | Admitting: Neurology

## 2019-04-22 ENCOUNTER — Other Ambulatory Visit: Payer: Self-pay

## 2019-04-22 ENCOUNTER — Ambulatory Visit: Payer: Medicaid Other | Admitting: Neurology

## 2019-04-22 VITALS — BP 102/64 | HR 76 | Temp 97.6°F | Ht 62.0 in | Wt 139.0 lb

## 2019-04-22 DIAGNOSIS — G44229 Chronic tension-type headache, not intractable: Secondary | ICD-10-CM

## 2019-04-22 DIAGNOSIS — R519 Headache, unspecified: Secondary | ICD-10-CM

## 2019-04-22 DIAGNOSIS — R51 Headache with orthostatic component, not elsewhere classified: Secondary | ICD-10-CM

## 2019-04-22 DIAGNOSIS — B349 Viral infection, unspecified: Secondary | ICD-10-CM | POA: Diagnosis not present

## 2019-04-22 DIAGNOSIS — Z8739 Personal history of other diseases of the musculoskeletal system and connective tissue: Secondary | ICD-10-CM

## 2019-04-22 DIAGNOSIS — H5462 Unqualified visual loss, left eye, normal vision right eye: Secondary | ICD-10-CM

## 2019-04-22 MED ORDER — TIZANIDINE HCL 4 MG PO TABS: 4.0000 mg | ORAL_TABLET | Freq: Four times a day (QID) | ORAL | 0 refills | Status: DC | PRN

## 2019-04-22 MED ORDER — NORTRIPTYLINE HCL 10 MG PO CAPS: 10.0000 mg | ORAL_CAPSULE | Freq: Every day | ORAL | 3 refills | Status: DC

## 2019-04-22 NOTE — Progress Notes (Signed)
[facility]    Provider:  Dr Jaynee Eagles Requesting Provider: Antony Blackbird, MD Primary Care Provider:  Antony Blackbird, MD  CC:  headaches  HPI:  Catherine Osborne is a 44 y.o. female here as requested by Antony Blackbird, MD for left temporal headache and visual disturbance. She started having headaches in February after covid. She used to have headaches when she was younger but those were migraines and these are different. It's in the forehead and temples in a band across the head, thumping, she denies associated light sensitivity (she has baseline light sensitivity especially at night not correlated to the headaches). She can feel the same pain with clenching, she grinds her teeth, she has already been told she grinds her teeth. She wakes up with the headaches and feels they are worse supine. She reports vision changes in the left eye but she states she had an injury in the past, she feels her eyes twitching. No increased stress. Headaches can be every other day, by 5pm it can get severe and she has to lay down, a nap helps. They can last 6 hours. She denies nausea associated with the headache. She says she has a hx of MS. No other focal neurologic deficits, associated symptoms, inciting events or modifiable factors.  Review of Systems: Patient complains of symptoms per HPI as well as the following symptoms: Hx of multiple sclerosis. Pertinent negatives and positives per HPI. All others negative.   Social History   Socioeconomic History  . Marital status: Significant Other    Spouse name: Not on file  . Number of children: 2  . Years of education: Not on file  . Highest education level: Some college, no degree  Occupational History  . Not on file  Tobacco Use  . Smoking status: Former Smoker    Packs/day: 0.75    Years: 20.00    Pack years: 15.00    Types: Cigarettes    Quit date: 01/31/2018    Years since quitting: 1.2  . Smokeless tobacco: Never Used  . Tobacco comment:  marijuana trying to decrease from 20 blunts day to 5 now  Substance and Sexual Activity  . Alcohol use: Yes    Alcohol/week: 1.0 standard drinks    Types: 1 Glasses of wine per week    Comment: rare - no liquor since april 2020/ drinks wine./ update 04/22/19 drinks at special occasions    . Drug use: Yes    Frequency: 70.0 times per week    Types: Marijuana    Comment: 20 blunts a day now to 5 a day - trying to quit./ update 04/22/19- maybe 10 blunts a day  . Sexual activity: Yes    Partners: Male    Birth control/protection: None  Other Topics Concern  . Not on file  Social History Narrative   Lives top of 3 story home; lives with family; right handed; some college; little caffeine - drinks in winter; exercise none      Update 04/22/2019   Lives with her sons   Caffeine: Dr Malachi Bonds 1-2 per day   Social Determinants of Health   Financial Resource Strain:   . Difficulty of Paying Living Expenses:   Food Insecurity:   . Worried About Charity fundraiser in the Last Year:   . Arboriculturist in the Last Year:   Transportation Needs:   . Film/video editor (Medical):   Marland Kitchen Lack of Transportation (Non-Medical):   Physical Activity:   .  Days of Exercise per Week:   . Minutes of Exercise per Session:   Stress:   . Feeling of Stress :   Social Connections:   . Frequency of Communication with Friends and Family:   . Frequency of Social Gatherings with Friends and Family:   . Attends Religious Services:   . Active Member of Clubs or Organizations:   . Attends Archivist Meetings:   Marland Kitchen Marital Status:   Intimate Partner Violence:   . Fear of Current or Ex-Partner:   . Emotionally Abused:   Marland Kitchen Physically Abused:   . Sexually Abused:     Family History  Adopted: Yes  Problem Relation Age of Onset  . Bone cancer Mother   . Breast cancer Maternal Grandmother   . Diabetes Other   . Hypertension Other   . Stroke Other   . Heart attack Other     Past Medical History:   Diagnosis Date  . Allergy   . Asthma   . Headache   . 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 Migraines   . Pneumonia   . Prior miscarriage with pregnancy in first trimester, antepartum   . Vaginal Pap smear, abnormal     Patient Active Problem List   Diagnosis Date Noted  . COVID-19 virus detected 03/07/2019  . Personal history of multiple sclerosis ([place]) 08/21/2018  . Depression 08/21/2018  . Shortness of breath 08/21/2018  . 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 Abnormal findings on diagnostic imaging of lung 08/21/2018  . Substance abuse ([place]) 08/21/2018  . Cocaine abuse ([place]) 08/21/2018  . Bronchitis, acute 04/03/2018  . Seasonal allergies 01/18/2016  . Cigarette smoker 01/18/2016  . Compliance poor 01/18/2016  . Asthma 12/07/2015  . Goiter diffuse 12/07/2015  . NSVD (normal spontaneous vaginal delivery) 06/13/2015  . Pregnancy induced hypertension 06/12/2015  . Preeclampsia 06/10/2015  . Short cervix affecting pregnancy 02/25/2015  . Advanced maternal age in multigravida 02/12/2015  . Left thyroid nodule 10/25/2011    Past Surgical History:  Procedure Laterality Date  . CERVICAL CERCLAGE N/A 02/26/2015   Procedure: CERCLAGE CERVICAL;  Surgeon: Frederico Hamman, MD;  Location: [facility];  Service: Gynecology;  Laterality: N/A;  . DILATION AND CURETTAGE OF UTERUS    . DILATION AND EVACUATION N/A 05/22/2012   Procedure: DILATATION AND EVACUATION;  Surgeon: Frederico Hamman, MD;  Location: [facility];  Service: Gynecology;  Laterality: N/A;  . FOOT SURGERY      Current Outpatient Medications  Medication Sig Dispense Refill  . BREO ELLIPTA 100-25 MCG/INH AEPB Inhale 1 puff into the lungs daily. 28 each 5  . nortriptyline (PAMELOR) 10 MG capsule Take 1 capsule (10 mg total) by mouth at bedtime. 60 capsule 3  . tiZANidine (ZANAFLEX) 4 MG tablet Take 1 tablet (4 mg total) by mouth every 6 (six) hours as needed. For headache as needed. 30 tablet 0   No current facility-administered  medications for this visit.    Allergies as of 04/22/2019  . (No Known Allergies)    Vitals: BP 102/64 (BP Location: Right Arm, Patient Position: Sitting)   Pulse 76   Temp 97.6 F (36.4 C) Comment: taken at front  Ht 5\' 2"  (1.575 m)   Wt 139 lb (63 kg)   LMP 04/08/2019   BMI 25.42 kg/m  Last Weight:  Wt Readings from Last 1 Encounters:  04/22/19 139 lb (63 kg)   Last Height:   Ht Readings from Last 1 Encounters:  04/22/19 5'  2" (1.575 m)     Physical exam: Exam: Gen: NAD, conversant, well nourised, well groomed                     CV: RRR, no MRG. No Carotid Bruits. No peripheral edema, warm, nontender Eyes: Conjunctivae clear without exudates or hemorrhage  Neuro: Detailed Neurologic Exam  Speech:    Speech is normal; fluent and spontaneous with normal comprehension.  Cognition:    The patient is oriented to person, place, and time;     recent and remote memory intact;     language fluent;     normal attention, concentration,     fund of knowledge Cranial Nerves:    The pupils are equal, round, and reactive to light. Attempted fundoscopy could not visualize. Visual fields are full to finger confrontation. Extraocular movements are intact. Trigeminal sensation is intact and the muscles of mastication are normal. The face is symmetric. The palate elevates in the midline. Hearing intact. Voice is normal. Shoulder shrug is normal. The tongue has normal motion without fasciculations.   Coordination:    No dysmetria  Gait:    Normal native gait  Motor Observation:    No asymmetry, no atrophy, and no involuntary movements noted. Tone:    Normal muscle tone.    Posture:    Posture is normal. normal erect    Strength:    Strength is V/V in the upper and lower limbs.      Sensation: intact to LT     Reflex Exam:  DTR's:    Deep tendon reflexes in the upper and lower extremities are normal bilaterally.   Toes:    The toes are downgoing bilaterally.     Clonus:    Clonus is absent.    Assessment/Plan:  44 year old female with tension-type headaches since covid so this may be a self-limited post-viral headache. She also reports she can feel the symptoms when she clenches and she grinds her teeth which may also be the cause or contributory. But given her reported history of Multiple sclerosis, left eye vision problems, positional headache and morning headache I think we need an MRI of the brain with MS protocol. Should see her dentist and get a mouthguard at night. Can try tizanidine prn could also try nortriptyline at bedtime. MRI brain due to concerning symptoms of morning headaches, positional headaches,left eye changes, hx of MS to look for space occupying mass, chiari or intracranial hypertension (pseudotumor) also to evaluate for Multiple Sclerosis.   Orders Placed This Encounter  Procedures  . MR BRAIN W WO CONTRAST  . CBC  . Comprehensive metabolic panel   Meds ordered this encounter  Medications  . nortriptyline (PAMELOR) 10 MG capsule    Sig: Take 1 capsule (10 mg total) by mouth at bedtime.    Dispense:  60 capsule    Refill:  3  . tiZANidine (ZANAFLEX) 4 MG tablet    Sig: Take 1 tablet (4 mg total) by mouth every 6 (six) hours as needed. For headache as needed.    Dispense:  30 tablet    Refill:  0    Cc: Fulp, Cammie, MD   I spent 60 minutes of face-to-face and non-face-to-face time with patient on the  1. Headache due to viral infection   2. Chronic tension-type headache, not intractable   3. Vision loss, left eye   4. Positional headache   5. Personal history of diseases of the ms sys  and conn tiss   6. Worsening headaches    diagnosis.  This included previsit chart review, lab review, study review, order entry, electronic health record documentation, patient education on the different diagnostic and therapeutic options, counseling and coordination of care, risks and benefits of management, compliance, or risk factor  reduction   Catherine Ill, MD  [facility] [address] [place], [place] [address]  Phone [phone] Fax [phone]

## 2019-04-22 NOTE — Patient Instructions (Addendum)
Start nortriptyline at bedtime Tizanidine as needed for headache MRI of the brain (we will call you for this)  Tizanidine tablets or capsules What is this medicine? TIZANIDINE (tye ZAN i deen) helps to relieve muscle spasms. It may be used to help in the treatment of multiple sclerosis and spinal cord injury. Also used for headaches.  This medicine may be used for other purposes; ask your health care provider or pharmacist if you have questions. COMMON BRAND NAME(S): Zanaflex What should I tell my health care provider before I take this medicine? They need to know if you have any of these conditions:  kidney disease  liver disease  low blood pressure  mental disorder  an unusual or allergic reaction to tizanidine, other medicines, lactose (tablets only), foods, dyes, or preservatives  pregnant or trying to get pregnant  breast-feeding How should I use this medicine? Take this medicine by mouth with a full glass of water. Take this medicine on an empty stomach, at least 30 minutes before or 2 hours after food. Do not take with food unless you talk with your doctor. Follow the directions on the prescription label. Take your medicine at regular intervals. Do not take your medicine more often than directed. Do not stop taking except on your doctor's advice. Suddenly stopping the medicine can be very dangerous. Talk to your pediatrician regarding the use of this medicine in children. Patients over 70 years old may have a stronger reaction and need a smaller dose. Overdosage: If you think you have taken too much of this medicine contact a poison control center or emergency room at once. NOTE: This medicine is only for you. Do not share this medicine with others. What if I miss a dose? If you miss a dose, take it as soon as you can. If it is almost time for your next dose, take only that dose. Do not take double or extra doses. What may interact with this medicine? Do not take this  medicine with any of the following medications:  ciprofloxacin  fluvoxamine  narcotic medicines for cough  thiabendazole This medicine may also interact with the following medications:  acyclovir  alcohol  antihistamines for allergy, cough, and cold  baclofen  certain medicines for anxiety or sleep  certain medicines for blood pressure, heart disease, irregular heartbeat  certain medicines for depression like amitriptyline, fluoxetine, sertraline  certain medicines for seizures like phenobarbital, primidone  certain medicines for stomach problems like cimetidine, famotidine  female hormones, like estrogens or progestins and birth control pills, patches, rings, or injections  general anesthetics like halothane, isoflurane, methoxyflurane, propofol  local anesthetics like lidocaine, pramoxine, tetracaine  medicines that relax muscles for surgery  narcotic medicines for pain  phenothiazines like chlorpromazine, mesoridazine, prochlorperazine  ticlopidine  zileuton This list may not describe all possible interactions. Give your health care provider a list of all the medicines, herbs, non-prescription drugs, or dietary supplements you use. Also tell them if you smoke, drink alcohol, or use illegal drugs. Some items may interact with your medicine. What should I watch for while using this medicine? Tell your doctor or health care professional if your symptoms do not start to get better or if they get worse. You may get drowsy or dizzy. Do not drive, use machinery, or do anything that needs mental alertness until you know how this medicine affects you. Do not stand or sit up quickly, especially if you are an older patient. This reduces the risk of dizzy or fainting  spells. Alcohol may interfere with the effect of this medicine. Avoid alcoholic drinks. If you are taking another medicine that also causes drowsiness, you may have more side effects. Give your health care provider  a list of all medicines you use. Your doctor will tell you how much medicine to take. Do not take more medicine than directed. Call emergency for help if you have problems breathing or unusual sleepiness. Your mouth may get dry. Chewing sugarless gum or sucking hard candy, and drinking plenty of water may help. Contact your doctor if the problem does not go away or is severe. What side effects may I notice from receiving this medicine? Side effects that you should report to your doctor or health care professional as soon as possible:  allergic reactions like skin rash, itching or hives, swelling of the face, lips, or tongue  breathing problems  hallucinations  signs and symptoms of liver injury like dark yellow or brown urine; general ill feeling or flu-like symptoms; light-colored stools; loss of appetite; nausea; right upper quadrant belly pain; unusually weak or tired; yellowing of the eyes or skin  signs and symptoms of low blood pressure like dizziness; feeling faint or lightheaded, falls; unusually weak or tired  unusually slow heartbeat  unusually weak or tired Side effects that usually do not require medical attention (report to your doctor or health care professional if they continue or are bothersome):  blurred vision  constipation  dizziness  dry mouth  tiredness This list may not describe all possible side effects. Call your doctor for medical advice about side effects. You may report side effects to FDA at 1-800-FDA-1088. Where should I keep my medicine? Keep out of the reach of children. Store at room temperature between 15 and 30 degrees C (59 and 86 degrees F). Throw away any unused medicine after the expiration date. NOTE: This sheet is a summary. It may not cover all possible information. If you have questions about this medicine, talk to your doctor, pharmacist, or health care provider.  2020 Elsevier/Gold Standard (2016-11-01 13:33:29)  Nortriptyline  capsules What is this medicine? NORTRIPTYLINE (nor TRIP ti leen) is used to treat headaches. This medicine may be used for other purposes; ask your health care provider or pharmacist if you have questions. COMMON BRAND NAME(S): Aventyl, Pamelor What should I tell my health care provider before I take this medicine? They need to know if you have any of these conditions:  bipolar disorder  Brugada syndrome  difficulty passing urine  glaucoma  heart disease  if you drink alcohol  liver disease  schizophrenia  seizures  suicidal thoughts, plans or attempt; a previous suicide attempt by you or a family member  thyroid disease  an unusual or allergic reaction to nortriptyline, other tricyclic antidepressants, other medicines, foods, dyes, or preservatives  pregnant or trying to get pregnant  breast-feeding How should I use this medicine? Take this medicine by mouth with a glass of water. Follow the directions on the prescription label. Take your doses at regular intervals. Do not take it more often than directed. Do not stop taking this medicine suddenly except upon the advice of your doctor. Stopping this medicine too quickly may cause serious side effects or your condition may worsen. A special MedGuide will be given to you by the pharmacist with each prescription and refill. Be sure to read this information carefully each time. Talk to your pediatrician regarding the use of this medicine in children. Special care may be needed. Overdosage:  If you think you have taken too much of this medicine contact a poison control center or emergency room at once. NOTE: This medicine is only for you. Do not share this medicine with others. What if I miss a dose? If you miss a dose, take it as soon as you can. If it is almost time for your next dose, take only that dose. Do not take double or extra doses. What may interact with this medicine? Do not take this medicine with any of the  following medications:  cisapride  dronedarone  linezolid  MAOIs like Carbex, Eldepryl, Marplan, Nardil, and Parnate  methylene blue (injected into a vein)  pimozide  thioridazine This medicine may also interact with the following medications:  alcohol  antihistamines for allergy, cough, and cold  atropine  certain medicines for bladder problems like oxybutynin, tolterodine  certain medicines for depression like amitriptyline, fluoxetine, sertraline  certain medicines for Parkinson's disease like benztropine, trihexyphenidyl  certain medicines for stomach problems like dicyclomine, hyoscyamine  certain medicines for travel sickness like scopolamine  chlorpropamide  cimetidine  ipratropium  other medicines that prolong the QT interval (an abnormal heart rhythm) like dofetilide  other medicines that can cause serotonin syndrome like St. John's Wort, fentanyl, lithium, tramadol, tryptophan, buspirone, and some medicines for headaches like sumatriptan or rizatriptan  quinidine  reserpine  thyroid medicine This list may not describe all possible interactions. Give your health care provider a list of all the medicines, herbs, non-prescription drugs, or dietary supplements you use. Also tell them if you smoke, drink alcohol, or use illegal drugs. Some items may interact with your medicine. What should I watch for while using this medicine? Tell your doctor if your symptoms do not get better or if they get worse. Visit your doctor or health care professional for regular checks on your progress. Because it may take several weeks to see the full effects of this medicine, it is important to continue your treatment as prescribed by your doctor. Patients and their families should watch out for new or worsening thoughts of suicide or depression. Also watch out for sudden changes in feelings such as feeling anxious, agitated, panicky, irritable, hostile, aggressive, impulsive,  severely restless, overly excited and hyperactive, or not being able to sleep. If this happens, especially at the beginning of treatment or after a change in dose, call your health care professional. Dennis Bast may get drowsy or dizzy. Do not drive, use machinery, or do anything that needs mental alertness until you know how this medicine affects you. Do not stand or sit up quickly, especially if you are an older patient. This reduces the risk of dizzy or fainting spells. Alcohol may interfere with the effect of this medicine. Avoid alcoholic drinks. Do not treat yourself for coughs, colds, or allergies without asking your doctor or health care professional for advice. Some ingredients can increase possible side effects. Your mouth may get dry. Chewing sugarless gum or sucking hard candy, and drinking plenty of water may help. Contact your doctor if the problem does not go away or is severe. This medicine may cause dry eyes and blurred vision. If you wear contact lenses you may feel some discomfort. Lubricating drops may help. See your eye doctor if the problem does not go away or is severe. This medicine can cause constipation. Try to have a bowel movement at least every 2 to 3 days. If you do not have a bowel movement for 3 days, call your doctor or health  care professional. This medicine can make you more sensitive to the sun. Keep out of the sun. If you cannot avoid being in the sun, wear protective clothing and use sunscreen. Do not use sun lamps or tanning beds/booths. What side effects may I notice from receiving this medicine? Side effects that you should report to your doctor or health care professional as soon as possible:  allergic reactions like skin rash, itching or hives, swelling of the face, lips, or tongue  anxious  breathing problems  changes in vision  confusion  elevated mood, decreased need for sleep, racing thoughts, impulsive behavior  eye pain  fast, irregular  heartbeat  feeling faint or lightheaded, falls  feeling agitated, angry, or irritable  fever with increased sweating  hallucination, loss of contact with reality  seizures  stiff muscles  suicidal thoughts or other mood changes  tingling, pain, or numbness in the feet or hands  trouble passing urine or change in the amount of urine  trouble sleeping  unusually weak or tired  vomiting  yellowing of the eyes or skin Side effects that usually do not require medical attention (report to your doctor or health care professional if they continue or are bothersome):  change in sex drive or performance  change in appetite or weight  constipation  dizziness  dry mouth  nausea  tired  tremors  upset stomach This list may not describe all possible side effects. Call your doctor for medical advice about side effects. You may report side effects to FDA at 1-800-FDA-1088. Where should I keep my medicine? Keep out of the reach of children. Store at room temperature between 15 and 30 degrees C (59 and 86 degrees F). Keep container tightly closed. Throw away any unused medicine after the expiration date. NOTE: This sheet is a summary. It may not cover all possible information. If you have questions about this medicine, talk to your doctor, pharmacist, or health care provider.  2020 Elsevier/Gold Standard (2018-01-09 13:24:58)

## 2019-04-23 ENCOUNTER — Ambulatory Visit
Admission: RE | Admit: 2019-04-23 | Discharge: 2019-04-23 | Disposition: A | Discharge status: Home / Self Care | Payer: Medicaid Other | Source: Ambulatory Visit | Attending: Obstetrics | Admitting: Obstetrics

## 2019-04-23 ENCOUNTER — Other Ambulatory Visit: Payer: Self-pay | Admitting: Obstetrics

## 2019-04-23 ENCOUNTER — Telehealth: Payer: Self-pay | Admitting: Neurology

## 2019-04-23 ENCOUNTER — Other Ambulatory Visit: Payer: Self-pay

## 2019-04-23 ENCOUNTER — Ambulatory Visit: Payer: Medicaid Other

## 2019-04-23 DIAGNOSIS — N6452 Nipple discharge: Secondary | ICD-10-CM

## 2019-04-23 DIAGNOSIS — N61 Mastitis without abscess: Secondary | ICD-10-CM

## 2019-04-23 DIAGNOSIS — L309 Dermatitis, unspecified: Secondary | ICD-10-CM

## 2019-04-23 LAB — CBC
Hematocrit: 44.1 % (ref 34.0–46.6)
Hemoglobin: 14.7 g/dL (ref 11.1–15.9)
MCH: 29.3 pg (ref 26.6–33.0)
MCHC: 33.3 g/dL (ref 31.5–35.7)
MCV: 88 fL (ref 79–97)
Platelets: 308 10*3/uL (ref 150–450)
RBC: 5.02 x10E6/uL (ref 3.77–5.28)
RDW: 13.3 % (ref 11.7–15.4)
WBC: 5.2 10*3/uL (ref 3.4–10.8)

## 2019-04-23 LAB — COMPREHENSIVE METABOLIC PANEL
ALT: 21 IU/L (ref 0–32)
AST: 19 IU/L (ref 0–40)
Albumin/Globulin Ratio: 1.5 (ref 1.2–2.2)
Albumin: 4.1 g/dL (ref 3.8–4.8)
Alkaline Phosphatase: 66 IU/L (ref 39–117)
BUN/Creatinine Ratio: 9 (ref 9–23)
BUN: 6 mg/dL (ref 6–24)
Bilirubin Total: 0.3 mg/dL (ref 0.0–1.2)
CO2: 25 mmol/L (ref 20–29)
Calcium: 9.2 mg/dL (ref 8.7–10.2)
Chloride: 102 mmol/L (ref 96–106)
Creatinine, Ser: 0.69 mg/dL (ref 0.57–1.00)
GFR calc Af Amer: 124 mL/min/{1.73_m2} (ref >59)
GFR calc non Af Amer: 108 mL/min/{1.73_m2} (ref >59)
Globulin, Total: 2.7 g/dL (ref 1.5–4.5)
Glucose: 80 mg/dL (ref 65–99)
Potassium: 4 mmol/L (ref 3.5–5.2)
Sodium: 138 mmol/L (ref 134–144)
Total Protein: 6.8 g/dL (ref 6.0–8.5)

## 2019-04-23 NOTE — Telephone Encounter (Signed)
Medicaid order sent to GI. They will obtain the auth and reach out to the patient to schedule.  

## 2019-04-29 ENCOUNTER — Other Ambulatory Visit: Payer: Self-pay

## 2019-04-29 ENCOUNTER — Encounter: Payer: Self-pay | Admitting: Obstetrics

## 2019-04-29 ENCOUNTER — Ambulatory Visit: Payer: Medicaid Other | Admitting: Obstetrics

## 2019-04-29 VITALS — BP 112/79 | HR 102

## 2019-04-29 DIAGNOSIS — K641 Second degree hemorrhoids: Secondary | ICD-10-CM | POA: Diagnosis not present

## 2019-04-29 MED ORDER — HYDROCORTISONE ACETATE 25 MG RE SUPP: 25.0000 mg | Freq: Two times a day (BID) | RECTAL | 11 refills | Status: DC

## 2019-04-29 NOTE — Progress Notes (Signed)
Patient ID: BETZY WICKERS, female   DOB: August 20, 1975, 44 y.o.   MRN: [id number]  Chief Complaint  Patient presents with  . Follow-up    HPI MADIE RANDA is a 44 y.o. female.  Complains of anal burning.  She does have a history of constipation. HPI  Past Medical History:  Diagnosis Date  . Allergy   . Asthma   . Headache   . 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 Migraines   . Pneumonia   . Prior miscarriage with pregnancy in first trimester, antepartum   . Vaginal Pap smear, abnormal     Past Surgical History:  Procedure Laterality Date  . CERVICAL CERCLAGE N/A 02/26/2015   Procedure: CERCLAGE CERVICAL;  Surgeon: Frederico Hamman, MD;  Location: [facility];  Service: Gynecology;  Laterality: N/A;  . DILATION AND CURETTAGE OF UTERUS    . DILATION AND EVACUATION N/A 05/22/2012   Procedure: DILATATION AND EVACUATION;  Surgeon: Frederico Hamman, MD;  Location: [facility];  Service: Gynecology;  Laterality: N/A;  . FOOT SURGERY      Family History  Adopted: Yes  Problem Relation Age of Onset  . Bone cancer Mother   . Breast cancer Maternal Grandmother   . Diabetes Other   . Hypertension Other   . Stroke Other   . Heart attack Other     Social History Social History   Tobacco Use  . Smoking status: Former Smoker    Packs/day: 0.75    Years: 20.00    Pack years: 15.00    Types: Cigarettes    Quit date: 01/31/2018    Years since quitting: 1.2  . Smokeless tobacco: Never Used  . Tobacco comment: marijuana trying to decrease from 20 blunts day to 5 now  Substance Use Topics  . Alcohol use: Yes    Alcohol/week: 1.0 standard drinks    Types: 1 Glasses of wine per week    Comment: rare - no liquor since april 2020/ drinks wine./ update 04/22/19 drinks at special occasions    . Drug use: Yes    Frequency: 70.0 times per week    Types: Marijuana    Comment: 20 blunts a day now to 5 a day - trying to quit./ update 04/22/19- maybe 10 blunts a day    No Known  Allergies  Current Outpatient Medications  Medication Sig Dispense Refill  . BREO ELLIPTA 100-25 MCG/INH AEPB Inhale 1 puff into the lungs daily. 28 each 5  . hydrocortisone (ANUSOL-HC) 25 MG suppository Place 1 suppository (25 mg total) rectally 2 (two) times daily. 12 suppository 11  . nortriptyline (PAMELOR) 10 MG capsule Take 1 capsule (10 mg total) by mouth at bedtime. 60 capsule 3  . tiZANidine (ZANAFLEX) 4 MG tablet Take 1 tablet (4 mg total) by mouth every 6 (six) hours as needed. For headache as needed. 30 tablet 0   No current facility-administered medications for this visit.    Review of Systems Review of Systems Constitutional: negative for fatigue and weight loss Respiratory: negative for cough and wheezing Cardiovascular: negative for chest pain, fatigue and palpitations Gastrointestinal: negative for abdominal pain and change in bowel habits Genitourinary:negative Integument/breast: negative for nipple discharge Musculoskeletal:negative for myalgias Neurological: negative for gait problems and tremors Behavioral/Psych: negative for abusive relationship, depression Endocrine: negative for temperature intolerance      Blood pressure 112/79, pulse (!) 102, last menstrual period 04/08/2019.  Physical Exam Physical Exam  General: Alert and no distress Abdomen:  normal findings: no organomegaly, soft, non-tender and no hernia  Pelvis:  External genitalia: normal general appearance Urinary system: urethral meatus normal and bladder without fullness, nontender Vaginal: normal without tenderness, induration or masses Cervix: normal appearance Adnexa: normal bimanual exam Uterus: anteverted and non-tender, normal size    50% of 15 min visit spent on counseling and coordination of care.   Data Reviewed Wet Prep  Assessment     1. Grade II hemorrhoids Rx: - hydrocortisone (ANUSOL-HC) 25 MG suppository; Place 1 suppository (25 mg total) rectally 2 (two) times  daily.  Dispense: 12 suppository; Refill: 11    Plan   Follow up prn  No orders of the defined types were placed in this encounter.  Meds ordered this encounter  Medications  . hydrocortisone (ANUSOL-HC) 25 MG suppository    Sig: Place 1 suppository (25 mg total) rectally 2 (two) times daily.    Dispense:  12 suppository    Refill:  11     Shelly Bombard, MD 04/29/2019 11:23 AM

## 2019-05-03 ENCOUNTER — Ambulatory Visit (HOSPITAL_COMMUNITY): Admission: RE | Admit: 2019-05-03 | Payer: Medicaid Other | Source: Ambulatory Visit

## 2019-05-07 ENCOUNTER — Encounter: Payer: Self-pay | Admitting: General Practice

## 2019-05-08 ENCOUNTER — Ambulatory Visit: Payer: Medicaid Other | Admitting: Podiatry

## 2019-05-09 ENCOUNTER — Ambulatory Visit: Payer: Medicaid Other | Admitting: Family Medicine

## 2019-05-21 ENCOUNTER — Other Ambulatory Visit (HOSPITAL_COMMUNITY): Payer: Self-pay | Admitting: Otolaryngology

## 2019-05-21 ENCOUNTER — Other Ambulatory Visit: Payer: Self-pay | Admitting: Otolaryngology

## 2019-05-21 DIAGNOSIS — E041 Nontoxic single thyroid nodule: Secondary | ICD-10-CM

## 2019-05-24 NOTE — Progress Notes (Signed)
[name], female    DOB: 06-03-1975  MRN: [id number]  CC: Follow-up  Subjective: Catherine Osborne is a 44 y.o. female with history of asthma, acute bronchitis, left thyroid nodule, goiter diffuse, renal mass, depression, seasonal allergies, personal history of multiple sclerosis, substance abuse, cocaine abuse, Covid-19 virus detected who presents for follow-up.  1. NIPPLE INFECTION FOLLOW-UP:  Reports she had infection of both nipples. The cause of the infection was unknown. States she did use the additional antibiotic she was prescribed at last visit with Dr. Chapman Osborne. States the pockets on infection of bilateral breasts spontaneously bursted and eventually healed. States when she was able to get an appointment at [facility] everything had resolved and there was nothing for the doctors or nurses to see physically. States she was told by the a doctor at [facility] to follow-up with their office if it returns in the future.  Last visit March 2021 with Dr. Chapman Osborne. During that encounter patient was provided with prescription for doxycycline in case she has a recurrent infection and to follow-up with breast center in two  weeks   2. HEADACHE FOLLOW-UP:  Patient states that she still has headaches sometimes. Reports that she did not take Excedrin Migraine as suggested by Dr. Chapman Osborne at her last visit. States that she did see a neurologist and a brain scan was completed and she was prescribed medication from the neurologist of which she did not take as well. Reports not eating for long periods of time and certain foods (of which she does not know specifically make headache worse.   Last visit March 2021 with Dr. Chapman Osborne. During that encounter a  sedimentation rate was checked to make sure that she does not have temporal arteritis and referred to neurology for further management of headaches, and to take over-the-counter Excedrin Migraine as needed.   Last visit March 2021 with  Dr. Jaynee Osborne. During that encounter MR of brain, CBC and CMP collected. Patient was prescribed Nortriptyline 10 mg/daily at bedtime and Tizanidine 4 mg every 6 hours as needed for headache. Patient reports she did not take the medication prescribed for the management of headaches.   3. VISION FOLLOW-UP: Reports vision still bothers her especially with night driving. Last visit March 2021 with Dr. Chapman Osborne. During that encounter patient referred to neurology and ophthalmology. Reports that she has a follow-up with neurology on 06/05/2019. Unsure about appointment status with ophthalmology referral.  4. THYROID FOLLOW-UP:  Reports she did see the ENT doctor which looked down her nose and throat with a tube. Reports that particular doctor told her that she could not see anything damaged or torn. Reports that she has not had thyroid ultrasound yet.  Last visit March 2021 with Dr. Chapman Osborne. During that encounter patient referred for thyroid ultrasound and TSH, and referred to ENT to follow-up on thyromegaly and hoarse speaking voice Reports that she does not have an appointment for thyroid ultrasound yet. States that she has been dealing with thyroid related issues for about 13 years.   5. HOARSENESS OF VOICE: Reports she did see the ENT doctor which looked down her nose and throat with a tube. Reports that particular doctor told her that she could not see anything damaged or torn. States that the doctor did not see an explanation for hoarseness of her voice. Reports she has not had ultrasound of thyroid yet. Reports that several women in her family have a hoarse voice as well but they have  never been examined to the extent that she has for it.  Last visit March 2021 with Dr. Chapman Osborne. During that encounter patient referred for thyroid ultrasound and TSH, and referred to ENT to follow-up on thyromegaly and hoarse speaking voice Reports that she does not have an appointment for thyroid ultrasound yet. States that she has  been dealing with thyroid related issues for about 13 years.   Patient Active Problem List   Diagnosis Date Noted  . COVID-19 virus detected 03/07/2019  . Personal history of multiple sclerosis ([place]) 08/21/2018  . Depression 08/21/2018  . Shortness of breath 08/21/2018  . 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 Abnormal findings on diagnostic imaging of lung 08/21/2018  . Substance abuse ([place]) 08/21/2018  . Cocaine abuse ([place]) 08/21/2018  . Bronchitis, acute 04/03/2018  . Seasonal allergies 01/18/2016  . Cigarette smoker 01/18/2016  . Compliance poor 01/18/2016  . Asthma 12/07/2015  . Goiter diffuse 12/07/2015  . NSVD (normal spontaneous vaginal delivery) 06/13/2015  . Pregnancy induced hypertension 06/12/2015  . Preeclampsia 06/10/2015  . Short cervix affecting pregnancy 02/25/2015  . Advanced maternal age in multigravida 02/12/2015  . Left thyroid nodule 10/25/2011     Current Outpatient Medications on File Prior to Visit  Medication Sig Dispense Refill  . BREO ELLIPTA 100-25 MCG/INH AEPB Inhale 1 puff into the lungs daily. 28 each 5  . hydrocortisone (ANUSOL-HC) 25 MG suppository Place 1 suppository (25 mg total) rectally 2 (two) times daily. 12 suppository 11  . nortriptyline (PAMELOR) 10 MG capsule Take 1 capsule (10 mg total) by mouth at bedtime. 60 capsule 3  . tiZANidine (ZANAFLEX) 4 MG tablet Take 1 tablet (4 mg total) by mouth every 6 (six) hours as needed. For headache as needed. 30 tablet 0   No current facility-administered medications on file prior to visit.    No Known Allergies  Social History   Socioeconomic History  . Marital status: Significant Other    Spouse name: Not on file  . Number of children: 2  . Years of education: Not on file  . Highest education level: Some college, no degree  Occupational History  . Not on file  Tobacco Use  . Smoking status: Former Smoker    Packs/day: 0.75    Years: 20.00    Pack years: 15.00    Types: Cigarettes    Quit date:  01/31/2018    Years since quitting: 1.3  . Smokeless tobacco: Never Used  . Tobacco comment: marijuana trying to decrease from 20 blunts day to 5 now  Substance and Sexual Activity  . Alcohol use: Yes    Alcohol/week: 1.0 standard drinks    Types: 1 Glasses of wine per week    Comment: rare - no liquor since april 2020/ drinks wine./ update 04/22/19 drinks at special occasions    . Drug use: Yes    Frequency: 70.0 times per week    Types: Marijuana    Comment: 20 blunts a day now to 5 a day - trying to quit./ update 04/22/19- maybe 10 blunts a day  . Sexual activity: Yes    Partners: Male    Birth control/protection: None  Other Topics Concern  . Not on file  Social History Narrative   Lives top of 3 story home; lives with family; right handed; some college; little caffeine - drinks in winter; exercise none      Update 04/22/2019   Lives with her sons   Caffeine: Dr Malachi Bonds 1-2 per day  Social Determinants of Health   Financial Resource Strain:   . Difficulty of Paying Living Expenses:   Food Insecurity:   . Worried About Charity fundraiser in the Last Year:   . Arboriculturist in the Last Year:   Transportation Needs:   . Film/video editor (Medical):   Marland Kitchen Lack of Transportation (Non-Medical):   Physical Activity:   . Days of Exercise per Week:   . Minutes of Exercise per Session:   Stress:   . Feeling of Stress :   Social Connections:   . Frequency of Communication with Friends and Family:   . Frequency of Social Gatherings with Friends and Family:   . Attends Religious Services:   . Active Member of Clubs or Organizations:   . Attends Archivist Meetings:   Marland Kitchen Marital Status:   Intimate Partner Violence:   . Fear of Current or Ex-Partner:   . Emotionally Abused:   Marland Kitchen Physically Abused:   . Sexually Abused:     Family History  Adopted: Yes  Problem Relation Age of Onset  . Bone cancer Mother   . Breast cancer Maternal Grandmother   . Diabetes Other    . Hypertension Other   . Stroke Other   . Heart attack Other     Past Surgical History:  Procedure Laterality Date  . CERVICAL CERCLAGE N/A 02/26/2015   Procedure: CERCLAGE CERVICAL;  Surgeon: Frederico Hamman, MD;  Location: [facility];  Service: Gynecology;  Laterality: N/A;  . DILATION AND CURETTAGE OF UTERUS    . DILATION AND EVACUATION N/A 05/22/2012   Procedure: DILATATION AND EVACUATION;  Surgeon: Frederico Hamman, MD;  Location: [facility];  Service: Gynecology;  Laterality: N/A;  . FOOT SURGERY      ROS: Review of Systems Negative except as stated above  PHYSICAL EXAM: There were no vitals taken for this visit.  Physical Exam General appearance - alert, well appearing, and in no distress, oriented to person, place, and time and normal appearing weight Mental status - alert, oriented to person, place, and time, normal mood, behavior, speech, dress, motor activity, and thought processes Neck - thyroid exam: thyroid enlarged, smooth, nontender, uninodular goiter left side of neck about the size of a small egg, Lymphatics - no palpable lymphadenopathy, no hepatosplenomegaly Chest - clear to auscultation, no wheezes, rales or rhonchi, symmetric air entry, no tachypnea, retractions or cyanosis Heart - normal rate, regular rhythm, normal S1, S2, no murmurs, rubs, clicks or gallops Breasts - patient declines to have breast exam  ASSESSMENT AND PLAN: 1. Nipple infection: -Bilateral nipple infection has resolved since last visit. -[facility] advised should this recur.    2. Left temporal headache: -Patient still having some headaches but prefers not to take any medication for this. -May take Excedrin Migraine as suggested at last visit with Dr. Chapman Osborne in March 2021. -Or may take Nortriptyline or Tizanidine as prescribed at last visit with neurologist Dr. Jaynee Osborne in March 2021. -Follow-up with neurology as needed.  3. Visual disturbance: -Night vision  especially while driving still bothering patient. Patient has not seen ophthalmology. -Please keep appointment with [facility] scheduled for 06/04/2019 at 9:45 AM.  4. Thyromegaly: -Ultrasound of thyroid still needed. Please make appointment to have this done as soon as possible. -There was a previous appointment for ultrasound of thyroid on 04/19/2019 and patient was not able to make that appointment. -Patient evaluated by Dr. Benjamine Mola on  05/21/2019. Patient reports a plan of care is still evolving as she has been dealing with thyroid concerns for at least 13 years. Dr. Deeann Saint  office does not use Epic charting system therefore, I do not have access to documentation related to this encounter.  5. Hoarseness of voice: -Patient evaluated by Dr. Benjamine Mola on 05/21/2019. Patient reports a plan of care is still evolving as she has been dealing with thyroid concerns for at least 13 years. Dr. Deeann Saint  office does not use Epic charting system therefore, I do not have access to documentation related to this encounter.   Patient was given the opportunity to ask questions.  Patient verbalized understanding of the plan and was able to repeat key elements of the plan. Patient was given clear instructions to go to Emergency Department or return to medical center if symptoms don't improve, worsen, or new problems develop.The patient verbalized understanding.  Requested Prescriptions    No prescriptions requested or ordered in this encounter    Kateri Balch Zachery Dauer, NP

## 2019-05-27 ENCOUNTER — Other Ambulatory Visit: Payer: Self-pay

## 2019-05-27 ENCOUNTER — Ambulatory Visit: Payer: Medicaid Other | Attending: Family | Admitting: Family

## 2019-05-27 ENCOUNTER — Encounter: Payer: Self-pay | Admitting: Family

## 2019-05-27 VITALS — BP 98/68 | HR 77 | Temp 97.7°F | Ht 62.0 in | Wt 138.0 lb

## 2019-05-27 DIAGNOSIS — Z808 Family history of malignant neoplasm of other organs or systems: Secondary | ICD-10-CM | POA: Diagnosis not present

## 2019-05-27 DIAGNOSIS — Z79899 Other long term (current) drug therapy: Secondary | ICD-10-CM | POA: Insufficient documentation

## 2019-05-27 DIAGNOSIS — R490 Dysphonia: Secondary | ICD-10-CM | POA: Diagnosis not present

## 2019-05-27 DIAGNOSIS — N61 Mastitis without abscess: Secondary | ICD-10-CM

## 2019-05-27 DIAGNOSIS — E010 Iodine-deficiency related diffuse (endemic) goiter: Secondary | ICD-10-CM | POA: Insufficient documentation

## 2019-05-27 DIAGNOSIS — Z8616 Personal history of COVID-19: Secondary | ICD-10-CM | POA: Insufficient documentation

## 2019-05-27 DIAGNOSIS — Z87891 Personal history of nicotine dependence: Secondary | ICD-10-CM | POA: Insufficient documentation

## 2019-05-27 DIAGNOSIS — H539 Unspecified visual disturbance: Secondary | ICD-10-CM

## 2019-05-27 DIAGNOSIS — N2889 Other specified disorders of kidney and ureter: Secondary | ICD-10-CM | POA: Insufficient documentation

## 2019-05-27 DIAGNOSIS — J45909 Unspecified asthma, uncomplicated: Secondary | ICD-10-CM | POA: Insufficient documentation

## 2019-05-27 DIAGNOSIS — R519 Headache, unspecified: Secondary | ICD-10-CM | POA: Diagnosis not present

## 2019-05-27 DIAGNOSIS — G35 Multiple sclerosis: Secondary | ICD-10-CM | POA: Diagnosis not present

## 2019-05-27 NOTE — Patient Instructions (Signed)
Keep appointments with specialists. Follow-up with primary physician as needed.  Hoarseness  Hoarseness, also called dysphonia, is any abnormal change in your voice that can make it difficult to speak. Your voice may sound raspy, breathy, or strained. Hoarseness is caused by a problem with your vocal cords (vocal folds). These are two bands of tissue inside your voice box (larynx). When you speak, your vocal cords move back and forth to create sound. The surfaces of your vocal cords need to be smooth for your voice to sound clear. Swelling or lumps on your vocal cords can cause hoarseness. Common causes of vocal cord problems include:  Infection in the nose, throat, and upper air passages (upper respiratory infection).  A long-term cough.  Straining or overusing your voice.  Smoking, or exposure to secondhand smoke.  Allergies.  Medication side effects.  Vocal cord growths.  Vocal cord injuries.  Stomach acids that move up in your throat and irritate your vocal cords (gastroesophageal reflux).  Diseases that affect the nervous system, such as a stroke or Parkinson's disease. Follow these instructions at home: Watch your condition for any changes. To ease discomfort and protect your vocal cords:  Rest your voice.  Do not whisper. Whispering can cause muscle strain.  Do not speak in a loud or harsh voice.  Avoid coughing or clearing your throat.  Do not use any products that contain nicotine or tobacco, such as cigarettes and e-cigarettes. If you need help quitting, ask your health care provider.  Avoid secondhand smoke.  Do not eat foods that give you heartburn, such as spicy or acidic foods like hot peppers and orange juice. Heartburn can make gastroesophageal reflux worse.  Do not drink beverages that contain caffeine (coffee, tea, or soft drinks) or alcohol (beer, wine, or liquor).  Drink enough fluid to keep your urine pale yellow.  Use a humidifier if the air in  your home is dry. If recommended by your health care provider, schedule an appointment with a speech-language specialist. This specialist may give you methods to try that can help you avoid misusing your voice. Contact a health care provider if:  You have hoarseness that lasts longer than 3 weeks.  You almost lose or completely lose your voice for more than 3 days.  You have pain when you swallow or try to talk.  You feel a lump in your neck. Get help right away if:  You have trouble swallowing.  You feel like you are choking when you swallow.  You cough up blood or vomit blood.  You have trouble breathing.  You choke, cannot swallow, or cannot breathe if you lie flat.  You notice swelling or a rash on your body, face, or tongue. Summary  Hoarseness, also called dysphonia, is any abnormal change in your voice that can make it difficult to speak. Your voice may sound raspy, breathy, or strained.  Hoarseness is caused by a problem with your vocal cords (vocal folds).  Do not speak in a loud or harsh voice, use nicotine or tobacco products, or eat foods that give you heartburn.  If recommended by your health care provider, meet with a speech-language specialist. This information is not intended to replace advice given to you by your health care provider. Make sure you discuss any questions you have with your health care provider. Document Revised: 12/30/2016 Document Reviewed: 10/14/2016 Elsevier Patient Education  [place].

## 2019-05-28 ENCOUNTER — Ambulatory Visit (HOSPITAL_COMMUNITY): Payer: Medicaid Other

## 2019-05-31 ENCOUNTER — Inpatient Hospital Stay: Admission: RE | Admit: 2019-05-31 | Payer: Medicaid Other | Source: Ambulatory Visit

## 2019-06-04 ENCOUNTER — Ambulatory Visit (HOSPITAL_COMMUNITY)
Admission: RE | Admit: 2019-06-04 | Discharge: 2019-06-04 | Disposition: A | Discharge status: Home / Self Care | Payer: Medicaid Other | Source: Ambulatory Visit | Attending: Otolaryngology | Admitting: Otolaryngology

## 2019-06-04 ENCOUNTER — Other Ambulatory Visit: Payer: Self-pay

## 2019-06-04 DIAGNOSIS — E041 Nontoxic single thyroid nodule: Secondary | ICD-10-CM

## 2019-06-15 ENCOUNTER — Other Ambulatory Visit (HOSPITAL_COMMUNITY): Payer: Medicaid Other

## 2019-06-19 ENCOUNTER — Ambulatory Visit: Payer: Medicaid Other | Admitting: Pulmonary Disease

## 2019-06-19 ENCOUNTER — Ambulatory Visit: Payer: Medicaid Other | Admitting: Primary Care

## 2019-07-14 ENCOUNTER — Encounter (HOSPITAL_COMMUNITY): Payer: Self-pay | Admitting: Emergency Medicine

## 2019-07-14 ENCOUNTER — Ambulatory Visit (HOSPITAL_COMMUNITY)
Admission: EM | Admit: 2019-07-14 | Discharge: 2019-07-14 | Disposition: A | Discharge status: Home / Self Care | Payer: Medicaid Other | Attending: Family Medicine | Admitting: Family Medicine

## 2019-07-14 ENCOUNTER — Other Ambulatory Visit: Payer: Self-pay

## 2019-07-14 DIAGNOSIS — Z3202 Encounter for pregnancy test, result negative: Secondary | ICD-10-CM | POA: Diagnosis not present

## 2019-07-14 DIAGNOSIS — N898 Other specified noninflammatory disorders of vagina: Secondary | ICD-10-CM | POA: Insufficient documentation

## 2019-07-14 LAB — POC URINE PREG, ED: Preg Test, Ur: NEGATIVE

## 2019-07-14 MED ORDER — METRONIDAZOLE 500 MG PO TABS
500.0000 mg | ORAL_TABLET | Freq: Two times a day (BID) | ORAL | 0 refills | Status: DC
Start: 2019-07-14 — End: 2019-08-16

## 2019-07-14 NOTE — Discharge Instructions (Signed)
Treating you for BV Take the medication as prescribed. Follow-up with your OB/GYN as needed Urine pregnancy test negative today.

## 2019-07-14 NOTE — ED Triage Notes (Signed)
Pt c/o late period, she states last month she had two periods, a normal one at the beginning of the month and then another that lasted for 2 days. Pt states that she has had hot flashes and sweating. Pt also states she has had a vaginal odor. She denies vaginal irritation but has had a white discharge.

## 2019-07-15 LAB — CERVICOVAGINAL ANCILLARY ONLY
Bacterial Vaginitis (gardnerella): POSITIVE — AB
Candida Glabrata: NEGATIVE
Candida Vaginitis: NEGATIVE
Chlamydia: NEGATIVE
Comment: NEGATIVE
Comment: NEGATIVE
Comment: NEGATIVE
Comment: NEGATIVE
Comment: NEGATIVE
Comment: NORMAL
Neisseria Gonorrhea: NEGATIVE
Trichomonas: NEGATIVE

## 2019-07-16 ENCOUNTER — Ambulatory Visit (INDEPENDENT_AMBULATORY_CARE_PROVIDER_SITE_OTHER): Payer: Medicaid Other | Admitting: Obstetrics

## 2019-07-16 ENCOUNTER — Encounter: Payer: Self-pay | Admitting: Obstetrics

## 2019-07-16 ENCOUNTER — Other Ambulatory Visit: Payer: Self-pay

## 2019-07-16 VITALS — BP 115/89 | HR 93 | Wt 135.0 lb

## 2019-07-16 DIAGNOSIS — B9689 Other specified bacterial agents as the cause of diseases classified elsewhere: Secondary | ICD-10-CM

## 2019-07-16 DIAGNOSIS — T192XXS Foreign body in vulva and vagina, sequela: Secondary | ICD-10-CM

## 2019-07-16 DIAGNOSIS — N760 Acute vaginitis: Secondary | ICD-10-CM | POA: Diagnosis not present

## 2019-07-16 NOTE — ED Provider Notes (Signed)
[place]    CSN: [id number] Arrival date & time: 07/14/19  1504      History   Chief Complaint Chief Complaint  Patient presents with  . Menstrual Problem  . Vaginitis    HPI AMILAH GREENSPAN is a 44 y.o. female.   Patient is a 44 year old female who presents today with irregular menstrual cycle, vaginal odor, discharge and irritation.  Symptoms have been constant over the past couple days.  Reporting last month she had 2 menstrual cycles, normal at the beginning of the month and then a 2-day menstrual cycle at the end of the month.  She has had some intermittent hot flashes and sweats.  No abdominal pain, dysuria, hematuria or urinary frequency.  No fevers.  Reporting menstrual cycles have been irregular for many months now.  She is currently trying to conceive.   ROS per HPI      Past Medical History:  Diagnosis Date  . Allergy   . Asthma   . Headache   . 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 Migraines   . Pneumonia   . Prior miscarriage with pregnancy in first trimester, antepartum   . Vaginal Pap smear, abnormal     Patient Active Problem List   Diagnosis Date Noted  . COVID-19 virus detected 03/07/2019  . Personal history of multiple sclerosis ([place]) 08/21/2018  . Depression 08/21/2018  . Shortness of breath 08/21/2018  . 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 Abnormal findings on diagnostic imaging of lung 08/21/2018  . Substance abuse ([employer]) 08/21/2018  . Cocaine abuse ([place]) 08/21/2018  . Bronchitis, acute 04/03/2018  . Seasonal allergies 01/18/2016  . Cigarette smoker 01/18/2016  . Compliance poor 01/18/2016  . Asthma 12/07/2015  . Goiter diffuse 12/07/2015  . NSVD (normal spontaneous vaginal delivery) 06/13/2015  . Pregnancy induced hypertension 06/12/2015  . Preeclampsia 06/10/2015  . Short cervix affecting pregnancy 02/25/2015  . Advanced maternal age in multigravida 02/12/2015  . Left thyroid nodule 10/25/2011    Past Surgical History:  Procedure  Laterality Date  . CERVICAL CERCLAGE N/A 02/26/2015   Procedure: CERCLAGE CERVICAL;  Surgeon: Frederico Hamman, MD;  Location: [facility];  Service: Gynecology;  Laterality: N/A;  . DILATION AND CURETTAGE OF UTERUS    . DILATION AND EVACUATION N/A 05/22/2012   Procedure: DILATATION AND EVACUATION;  Surgeon: Frederico Hamman, MD;  Location: [facility];  Service: Gynecology;  Laterality: N/A;  . FOOT SURGERY      OB History    Gravida  14   Para  2   Term  1   Preterm  1   AB  12   Living  1     SAB  10   TAB  2   Ectopic      Multiple  0   Live Births  1            Home Medications    Prior to Admission medications   Medication Sig Start Date End Date Taking? Authorizing Provider  BREO ELLIPTA 100-25 MCG/INH AEPB Inhale 1 puff into the lungs daily. 01/15/19   Chesley Mires, MD  metroNIDAZOLE (FLAGYL) 500 MG tablet Take 1 tablet (500 mg total) by mouth 2 (two) times daily. 07/14/19   Loura Halt A, NP  nortriptyline (PAMELOR) 10 MG capsule Take 1 capsule (10 mg total) by mouth at bedtime. Patient not taking: Reported on 05/27/2019 04/22/19 07/14/19  Melvenia Beam, MD    Family History Family History  Adopted:  Yes  Problem Relation Age of Onset  . Bone cancer Mother   . Breast cancer Maternal Grandmother   . Diabetes Other   . Hypertension Other   . Stroke Other   . Heart attack Other     Social History Social History   Tobacco Use  . Smoking status: Former Smoker    Packs/day: 0.75    Years: 20.00    Pack years: 15.00    Types: Cigarettes    Quit date: 01/31/2018    Years since quitting: 1.4  . Smokeless tobacco: Never Used  . Tobacco comment: marijuana trying to decrease from 20 blunts day to 5 now  Vaping Use  . Vaping Use: Former  Substance Use Topics  . Alcohol use: Yes    Alcohol/week: 1.0 standard drink    Types: 1 Glasses of wine per week    Comment: rare - no liquor since april 2020/ drinks wine./ update 04/22/19 drinks at special occasions     . Drug use: Yes    Frequency: 70.0 times per week    Types: Marijuana    Comment: 20 blunts a day now to 5 a day - trying to quit./ update 04/22/19- maybe 10 blunts a day     Allergies   Patient has no known allergies.   Review of Systems Review of Systems   Physical Exam Triage Vital Signs ED Triage Vitals  Enc Vitals Group     BP 07/14/19 1530 105/76     Pulse Rate 07/14/19 1530 79     Resp 07/14/19 1530 12     Temp 07/14/19 1530 99.2 F (37.3 C)     Temp Source 07/14/19 1530 Oral     SpO2 07/14/19 1530 99 %     Weight --      Height --      Head Circumference --      Peak Flow --      Pain Score 07/14/19 1528 0     Pain Loc --      Pain Edu? --      Excl. in [place]? --    No data found.  Updated Vital Signs BP 105/76 (BP Location: Right Arm)   Pulse 79   Temp 99.2 F (37.3 C) (Oral)   Resp 12   SpO2 99%   Visual Acuity Right Eye Distance:   Left Eye Distance:   Bilateral Distance:    Right Eye Near:   Left Eye Near:    Bilateral Near:     Physical Exam Vitals and nursing note reviewed.  Constitutional:      General: She is not in acute distress.    Appearance: Normal appearance. She is not ill-appearing, toxic-appearing or diaphoretic.  HENT:     Head: Normocephalic.     Nose: Nose normal.  Eyes:     Conjunctiva/sclera: Conjunctivae normal.  Pulmonary:     Effort: Pulmonary effort is normal.  Musculoskeletal:        General: Normal range of motion.     Cervical back: Normal range of motion.  Skin:    General: Skin is warm and dry.     Findings: No rash.  Neurological:     Mental Status: She is alert.  Psychiatric:        Mood and Affect: Mood normal.      UC Treatments / Results  Labs (all labs ordered are listed, but only abnormal results are displayed) Labs Reviewed  CERVICOVAGINAL ANCILLARY ONLY - Abnormal;  Notable for the following components:      Result Value   Bacterial Vaginitis ([place]) Positive (*)    All other  components within normal limits  POC URINE PREG, ED    EKG   Radiology No results found.  Procedures Procedures (including critical care time)  Medications Ordered in UC Medications - No data to display  Initial Impression / Assessment and Plan / UC Course  I have reviewed the triage vital signs and the nursing notes.  Pertinent labs & imaging results that were available during my care of the patient were reviewed by me and considered in my medical decision making (see chart for details).     Vaginal odor Treated for BV based on history and symptoms. Recommend follow-up with OB/GYN as needed Pregnancy test negative here today. Swab sent for testing Final Clinical Impressions(s) / UC Diagnoses   Final diagnoses:  Vaginal odor     Discharge Instructions     Treating you for BV Take the medication as prescribed. Follow-up with your OB/GYN as needed Urine pregnancy test negative today.    ED Prescriptions    Medication Sig Dispense Auth. Provider   metroNIDAZOLE (FLAGYL) 500 MG tablet Take 1 tablet (500 mg total) by mouth 2 (two) times daily. 14 tablet Sianna Garofano A, NP     PDMP not reviewed this encounter.   Loura Halt A, NP 07/16/19 [phone]

## 2019-07-16 NOTE — Progress Notes (Signed)
[name], female   DOB: [birth year], 44 y.o.   MRN: [id number]  Chief Complaint  Patient presents with  . Gynecologic Exam    HPI Catherine Osborne is a 44 y.o. female.  Had a tampon that was retained in vagina for several weeks.  Tampon was removed this am, and she wanted to come in to be checked.  Was recently seen in ED and diagnosed with BV that was treated with Flagyl. HPI  Past Medical History:  Diagnosis Date  . Allergy   . Asthma   . Headache   . 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 Migraines   . Pneumonia   . Prior miscarriage with pregnancy in first trimester, antepartum   . Vaginal Pap smear, abnormal     Past Surgical History:  Procedure Laterality Date  . CERVICAL CERCLAGE N/A 02/26/2015   Procedure: CERCLAGE CERVICAL;  Surgeon: Frederico Hamman, MD;  Location: [facility];  Service: Gynecology;  Laterality: N/A;  . DILATION AND CURETTAGE OF UTERUS    . DILATION AND EVACUATION N/A 05/22/2012   Procedure: DILATATION AND EVACUATION;  Surgeon: Frederico Hamman, MD;  Location: [facility];  Service: Gynecology;  Laterality: N/A;  . FOOT SURGERY      Family History  Adopted: Yes  Problem Relation Age of Onset  . Bone cancer Mother   . Breast cancer Maternal Grandmother   . Diabetes Other   . Hypertension Other   . Stroke Other   . Heart attack Other     Social History Social History   Tobacco Use  . Smoking status: Former Smoker    Packs/day: 0.75    Years: 20.00    Pack years: 15.00    Types: Cigarettes    Quit date: 01/31/2018    Years since quitting: 1.4  . Smokeless tobacco: Never Used  . Tobacco comment: marijuana trying to decrease from 20 blunts day to 5 now  Vaping Use  . Vaping Use: Former  Substance Use Topics  . Alcohol use: Yes    Alcohol/week: 1.0 standard drink    Types: 1 Glasses of wine per week    Comment: rare - no liquor since april 2020/ drinks wine./ update 04/22/19 drinks at special occasions    . Drug use: Yes     Frequency: 70.0 times per week    Types: Marijuana    Comment: 20 blunts a day now to 5 a day - trying to quit./ update 04/22/19- maybe 10 blunts a day    No Known Allergies  Current Outpatient Medications  Medication Sig Dispense Refill  . metroNIDAZOLE (FLAGYL) 500 MG tablet Take 1 tablet (500 mg total) by mouth 2 (two) times daily. 14 tablet 0  . BREO ELLIPTA 100-25 MCG/INH AEPB Inhale 1 puff into the lungs daily. 28 each 5   No current facility-administered medications for this visit.    Review of Systems Review of Systems Constitutional: negative for fatigue and weight loss Respiratory: negative for cough and wheezing Cardiovascular: negative for chest pain, fatigue and palpitations Gastrointestinal: negative for abdominal pain and change in bowel habits Genitourinary: positive for retained Tampon Integument/breast: negative for nipple discharge Musculoskeletal:negative for myalgias Neurological: negative for gait problems and tremors Behavioral/Psych: negative for abusive relationship, depression Endocrine: negative for temperature intolerance      Blood pressure 115/89, pulse 93, weight 135 lb (61.2 kg), last menstrual period 06/20/2019.  Physical Exam Physical Exam General:   alert  Skin:   no  rash or abnormalities  Lungs:   clear to auscultation bilaterally  Heart:   regular rate and rhythm, S1, S2 normal, no murmur, click, rub or gallop  Breasts:   normal without suspicious masses, skin or nipple changes or axillary nodes  Abdomen:  normal findings: no organomegaly, soft, non-tender and no hernia  Pelvis:  External genitalia: normal general appearance Urinary system: urethral meatus normal and bladder without fullness, nontender Vaginal: normal without tenderness, induration or masses Cervix: normal appearance Adnexa: normal bimanual exam Uterus: anteverted and non-tender, normal size    50% of 15 min visit spent on counseling and coordination of care.   Data  Reviewed Wet Prep  Assessment     1. Retained tampon, sequela - normal exam  2. BV (bacterial vaginosis) - Flagyl Rx    Plan  Follow up in 1 month for Annual   Shelly Bombard, MD 07/16/2019 11:05 AM

## 2019-07-16 NOTE — Progress Notes (Signed)
Pt states she may have had tampon in for several weeks. Pt states she had cold like symptoms.  Pt states that tampon was removed overnight.   Pt has been having abd pain since it has been removed.

## 2019-07-22 ENCOUNTER — Ambulatory Visit
Admission: RE | Admit: 2019-07-22 | Discharge: 2019-07-22 | Disposition: A | Discharge status: Home / Self Care | Payer: Medicaid Other | Source: Ambulatory Visit | Attending: Pulmonary Disease | Admitting: Pulmonary Disease

## 2019-07-22 DIAGNOSIS — R911 Solitary pulmonary nodule: Secondary | ICD-10-CM

## 2019-07-24 ENCOUNTER — Other Ambulatory Visit: Payer: Self-pay

## 2019-07-24 ENCOUNTER — Encounter: Payer: Self-pay | Admitting: Family Medicine

## 2019-07-24 ENCOUNTER — Ambulatory Visit: Payer: Medicaid Other | Admitting: Family Medicine

## 2019-07-24 VITALS — BP 115/81 | HR 79 | Ht 62.0 in | Wt 134.0 lb

## 2019-07-24 DIAGNOSIS — B349 Viral infection, unspecified: Secondary | ICD-10-CM | POA: Diagnosis not present

## 2019-07-24 DIAGNOSIS — R519 Headache, unspecified: Secondary | ICD-10-CM | POA: Diagnosis not present

## 2019-07-24 NOTE — Patient Instructions (Signed)
We will help you get the MRI scheduled. Listen out for a call from our office.   We will hold off on starting any headache medications as they have resolved.   Please continue close follow up with PCP and other specialists as directed.   We will follow up pending MRI results.   General Headache Without Cause A headache is pain or discomfort that is felt around the head or neck area. There are many causes and types of headaches. In some cases, the cause may not be found. Follow these instructions at home: Watch your condition for any changes. Let your doctor know about them. Take these steps to help with your condition: Managing pain      Take over-the-counter and prescription medicines only as told by your doctor.  Lie down in a dark, quiet room when you have a headache.  If told, put ice on your head and neck area: ? Put ice in a plastic bag. ? Place a towel between your skin and the bag. ? Leave the ice on for 20 minutes, 2-3 times per day.  If told, put heat on the affected area. Use the heat source that your doctor recommends, such as a moist heat pack or a heating pad. ? Place a towel between your skin and the heat source. ? Leave the heat on for 20-30 minutes. ? Remove the heat if your skin turns bright red. This is very important if you are unable to feel pain, heat, or cold. You may have a greater risk of getting burned.  Keep lights dim if bright lights bother you or make your headaches worse. Eating and drinking  Eat meals on a regular schedule.  If you drink alcohol: ? Limit how much you use to:  0-1 drink a day for women.  0-2 drinks a day for men. ? Be aware of how much alcohol is in your drink. In the [place] one drink equals one 12 oz bottle of beer (355 mL), one 5 oz glass of wine (148 mL), or one 1 oz glass of hard liquor (44 mL).  Stop drinking caffeine, or reduce how much caffeine you drink. General instructions   Keep a journal to find out if  certain things bring on headaches. For example, write down: ? What you eat and drink. ? How much sleep you get. ? Any change to your diet or medicines.  Get a massage or try other ways to relax.  Limit stress.  Sit up straight. Do not tighten (tense) your muscles.  Do not use any products that contain nicotine or tobacco. This includes cigarettes, e-cigarettes, and chewing tobacco. If you need help quitting, ask your doctor.  Exercise regularly as told by your doctor.  Get enough sleep. This often means 7-9 hours of sleep each night.  Keep all follow-up visits as told by your doctor. This is important. Contact a doctor if:  Your symptoms are not helped by medicine.  You have a headache that feels different than the other headaches.  You feel sick to your stomach (nauseous) or you throw up (vomit).  You have a fever. Get help right away if:  Your headache gets very bad quickly.  Your headache gets worse after a lot of physical activity.  You keep throwing up.  You have a stiff neck.  You have trouble seeing.  You have trouble speaking.  You have pain in the eye or ear.  Your muscles are weak or you lose muscle control.  You lose your balance or have trouble walking.  You feel like you will pass out (faint) or you pass out.  You are mixed up (confused).  You have a seizure. Summary  A headache is pain or discomfort that is felt around the head or neck area.  There are many causes and types of headaches. In some cases, the cause may not be found.  Keep a journal to help find out what causes your headaches. Watch your condition for any changes. Let your doctor know about them.  Contact a doctor if you have a headache that is different from usual, or if your headache is not helped by medicine.  Get help right away if your headache gets very bad, you throw up, you have trouble seeing, you lose your balance, or you have a seizure. This information is not  intended to replace advice given to you by your health care provider. Make sure you discuss any questions you have with your health care provider. Document Revised: 08/07/2017 Document Reviewed: 08/07/2017 Elsevier Patient Education  [place].

## 2019-07-24 NOTE — Progress Notes (Addendum)
PATIENT: Catherine Osborne DOB: 04-19-1975  REASON FOR VISIT: follow up HISTORY FROM: patient  Chief Complaint  Patient presents with  . Follow-up    Rm 1 here for a f/u on headaches. Pt said she has MS     HISTORY OF PRESENT ILLNESS: Today 07/24/19 Catherine Osborne is a 44 y.o. female here today for follow up for headaches. She reports that she did not pick up nortriptyline or tizanidine stating that the pharmacy never gave it to her. She has not had the MRI ordered by Dr Jaynee Eagles. She remains concerned that she has MS. She reports being told that by her PCP provider with [facility] Bl[facility]. She was evaluated by Dr Tomi Likens, Idamae Schuller, who did not recommend additional workup as no evidence in history or on exam. She was referred to [place] for headaches. Due to her concerns of MS and concerning symptoms with headaches, MRI was ordered. She states that she never received a call to schedule this. Headaches are much improved. She rarely has a headache. She does report continued vision changes. Eye exam about 6 months ago. She reports needing new rx glasses but has not picked those up. She describes sensitivity to lights at night when driving. She has chronic back pain. No changes to gait or weakness.   HISTORY: (copied from Dr Cathren Laine note on 04/22/2019)  HPI:  AMOREE NEWLON is a 44 y.o. female here as requested by Antony Blackbird, MD for left temporal headache and visual disturbance. She started having headaches in February after covid. She used to have headaches when she was younger but those were migraines and these are different. It's in the forehead and temples in a band across the head, thumping, she denies associated light sensitivity (she has baseline light sensitivity especially at night not correlated to the headaches). She can feel the same pain with clenching, she grinds her teeth, she has already been told she grinds her teeth. She wakes up with the headaches and feels they are worse supine. She reports  vision changes in the left eye but she states she had an injury in the past, she feels her eyes twitching. No increased stress. Headaches can be every other day, by 5pm it can get severe and she has to lay down, a nap helps. They can last 6 hours. She denies nausea associated with the headache. She says she has a hx of MS. No other focal neurologic deficits, associated symptoms, inciting events or modifiable factors.  REVIEW OF SYSTEMS: Out of a complete 14 system review of symptoms, the patient complains only of the following symptoms, back pain, headaches, vision changes, and all other reviewed systems are negative.  ALLERGIES: No Known Allergies  HOME MEDICATIONS: Outpatient Medications Prior to Visit  Medication Sig Dispense Refill  . BREO ELLIPTA 100-25 MCG/INH AEPB Inhale 1 puff into the lungs daily. 28 each 5  . metroNIDAZOLE (FLAGYL) 500 MG tablet Take 1 tablet (500 mg total) by mouth 2 (two) times daily. 14 tablet 0   No facility-administered medications prior to visit.    PAST MEDICAL HISTORY: Past Medical History:  Diagnosis Date  . Allergy   . Asthma   . Headache   . 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 Migraines   . Pneumonia   . Prior miscarriage with pregnancy in first trimester, antepartum   . Vaginal Pap smear, abnormal     PAST SURGICAL HISTORY: Past Surgical History:  Procedure Laterality Date  . CERVICAL CERCLAGE N/A  02/26/2015   Procedure: CERCLAGE CERVICAL;  Surgeon: Frederico Hamman, MD;  Location: [facility];  Service: Gynecology;  Laterality: N/A;  . DILATION AND CURETTAGE OF UTERUS    . DILATION AND EVACUATION N/A 05/22/2012   Procedure: DILATATION AND EVACUATION;  Surgeon: Frederico Hamman, MD;  Location: [facility];  Service: Gynecology;  Laterality: N/A;  . FOOT SURGERY      FAMILY HISTORY: Family History  Adopted: Yes  Problem Relation Age of Onset  . Bone cancer Mother   . Breast cancer Maternal Grandmother   . Diabetes Other   . Hypertension Other    . Stroke Other   . Heart attack Other     SOCIAL HISTORY: Social History   Socioeconomic History  . Marital status: Significant Other    Spouse name: Not on file  . Number of children: 2  . Years of education: Not on file  . Highest education level: Some college, no degree  Occupational History  . Not on file  Tobacco Use  . Smoking status: Former Smoker    Packs/day: 0.75    Years: 20.00    Pack years: 15.00    Types: Cigarettes    Quit date: 01/31/2018    Years since quitting: 1.4  . Smokeless tobacco: Never Used  . Tobacco comment: marijuana trying to decrease from 20 blunts day to 5 now  Vaping Use  . Vaping Use: Former  Substance and Sexual Activity  . Alcohol use: Yes    Alcohol/week: 1.0 standard drink    Types: 1 Glasses of wine per week    Comment: rare - no liquor since april 2020/ drinks wine./ update 04/22/19 drinks at special occasions    . Drug use: Yes    Frequency: 70.0 times per week    Types: Marijuana    Comment: 20 blunts a day now to 5 a day - trying to quit./ update 04/22/19- maybe 10 blunts a day  . Sexual activity: Yes    Partners: Male    Birth control/protection: None  Other Topics Concern  . Not on file  Social History Narrative   Lives top of 3 story home; lives with family; right handed; some college; little caffeine - drinks in winter; exercise none      Update 04/22/2019   Lives with her sons   Caffeine: Dr Malachi Bonds 1-2 per day   Social Determinants of Health   Financial Resource Strain:   . Difficulty of Paying Living Expenses:   Food Insecurity:   . Worried About Charity fundraiser in the Last Year:   . Arboriculturist in the Last Year:   Transportation Needs:   . Film/video editor (Medical):   Marland Kitchen Lack of Transportation (Non-Medical):   Physical Activity:   . Days of Exercise per Week:   . Minutes of Exercise per Session:   Stress:   . Feeling of Stress :   Social Connections:   . Frequency of Communication with Friends  and Family:   . Frequency of Social Gatherings with Friends and Family:   . Attends Religious Services:   . Active Member of Clubs or Organizations:   . Attends Archivist Meetings:   Marland Kitchen Marital Status:   Intimate Partner Violence:   . Fear of Current or Ex-Partner:   . Emotionally Abused:   Marland Kitchen Physically Abused:   . Sexually Abused:       PHYSICAL EXAM  Vitals:   07/24/19 1047  BP: 115/81  Pulse: 79  Weight: 134 lb (60.8 kg)  Height: 5\' 2"  (1.575 m)   Body mass index is 24.51 kg/m.  Generalized: Well developed, in no acute distress  Cardiology: normal rate and rhythm, no murmur noted Respiratory: clear to auscultation bilaterally  Neurological examination  Mentation: Alert oriented to time, place, history taking. Follows all commands speech and language fluent Cranial nerve II-XII: Pupils were equal round reactive to light. Extraocular movements were full, visual field were full on confrontational test. Facial sensation and strength were normal. Uvula tongue midline. Head turning and shoulder shrug  were normal and symmetric. Motor: The motor testing reveals 5 over 5 strength of all 4 extremities. Good symmetric motor tone is noted throughout.  Sensory: Sensory testing is intact to soft touch on all 4 extremities. No evidence of extinction is noted.  Coordination: Cerebellar testing reveals good finger-nose-finger and heel-to-shin bilaterally.  Gait and station: Gait is normal.  Reflexes: Deep tendon reflexes are symmetric and normal bilaterally.   DIAGNOSTIC DATA (LABS, IMAGING, TESTING) - I reviewed patient records, labs, notes, testing and imaging myself where available.  No flowsheet data found.   Lab Results  Component Value Date   WBC 5.2 04/22/2019   HGB 14.7 04/22/2019   HCT 44.1 04/22/2019   MCV 88 04/22/2019   PLT 308 04/22/2019      Component Value Date/Time   NA 138 04/22/2019 1209   K 4.0 04/22/2019 1209   CL 102 04/22/2019 1209   CO2 25  04/22/2019 1209   GLUCOSE 80 04/22/2019 1209   GLUCOSE 98 09/28/2018 0248   BUN 6 04/22/2019 1209   CREATININE 0.69 04/22/2019 1209   CALCIUM 9.2 04/22/2019 1209   PROT 6.8 04/22/2019 1209   ALBUMIN 4.1 04/22/2019 1209   AST 19 04/22/2019 1209   ALT 21 04/22/2019 1209   ALKPHOS 66 04/22/2019 1209   BILITOT 0.3 04/22/2019 1209   GFRNONAA 108 04/22/2019 1209   GFRAA 124 04/22/2019 1209   No results found for: CHOL, HDL, LDLCALC, LDLDIRECT, TRIG, CHOLHDL Lab Results  Component Value Date   HGBA1C 5.2 11/16/2018   Lab Results  Component Value Date   LGXQJJHE17 408 11/16/2018   Lab Results  Component Value Date   TSH 0.863 04/11/2019       ASSESSMENT AND PLAN 44 y.o. year old female  has a past medical history of Allergy, Asthma, Headache, Lactose intolerance in adult, Migraines, Pneumonia, Prior miscarriage with pregnancy in first trimester, antepartum, and Vaginal Pap smear, abnormal. here with     ICD-10-CM   1. Headache due to viral infection  B34.9    R51.9     Kamyla is doing better from a headache standpoint. She was advised to continue monitoring for now. I do not recommend adding any medications at this time. She remains concerned about vision changes and possible diagnosis of MS. No abnormalities to suggest concerns of MS on exam today. I have encouraged her to proceed with MRI brain for additional reassurance. I will ask our staff to assist her with scheduling. She was encouraged to work on healthy lifestyle habits. Well balanced diet and regular exercise advised. Regular follow up with PCP encouraged. We will follow up pending MRI results.    No orders of the defined types were placed in this encounter.    No orders of the defined types were placed in this encounter.     I spent 15 minutes with the patient. 50% of this time was  spent counseling and educating patient on plan of care and medications.    Debbora Presto, FNP-C 07/24/2019, 12:40 PM [facility] [address] [place],  [address] [phone]  Made any corrections needed, and agree with history, physical, neuro exam,assessment and plan as stated.     Sarina Ill, MD [facility]

## 2019-07-25 ENCOUNTER — Telehealth: Payer: Self-pay | Admitting: Family Medicine

## 2019-07-25 ENCOUNTER — Other Ambulatory Visit: Payer: Self-pay | Admitting: Family Medicine

## 2019-07-25 DIAGNOSIS — H539 Unspecified visual disturbance: Secondary | ICD-10-CM

## 2019-07-25 DIAGNOSIS — R51 Headache with orthostatic component, not elsewhere classified: Secondary | ICD-10-CM

## 2019-07-25 NOTE — Telephone Encounter (Signed)
medicaid order sent to GI. They will obtain the auth and reach out to the patient to schedule.  °

## 2019-07-25 NOTE — Progress Notes (Signed)
Patient scheduled for MRI ordered by Dr Jaynee Eagles. MRI was cancelled and patient wises to reschedule. New orders placed at request of GI imaging.

## 2019-07-30 ENCOUNTER — Other Ambulatory Visit: Payer: Self-pay

## 2019-07-30 ENCOUNTER — Ambulatory Visit: Payer: Medicaid Other | Admitting: Podiatry

## 2019-07-30 ENCOUNTER — Encounter: Payer: Self-pay | Admitting: Podiatry

## 2019-07-30 DIAGNOSIS — D492 Neoplasm of unspecified behavior of bone, soft tissue, and skin: Secondary | ICD-10-CM | POA: Diagnosis not present

## 2019-07-30 DIAGNOSIS — L905 Scar conditions and fibrosis of skin: Secondary | ICD-10-CM

## 2019-08-04 NOTE — Progress Notes (Signed)
Subjective:  [name], female    DOB: 14-Sep-1975,  MRN: [id number]  44 y.o. female presents with follow up painful surgical scar plantar aspect left foot. Pain interferes with her daily activities such as caring for her son..    Review of Systems: Negative except as noted in the HPI. Denies N/V/F/Ch. Past Medical History:  Diagnosis Date  . Allergy   . Asthma   . Headache   . 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 Migraines   . Pneumonia   . Prior miscarriage with pregnancy in first trimester, antepartum   . Vaginal Pap smear, abnormal    Past Surgical History:  Procedure Laterality Date  . CERVICAL CERCLAGE N/A 02/26/2015   Procedure: CERCLAGE CERVICAL;  Surgeon: Frederico Hamman, MD;  Location: [facility];  Service: Gynecology;  Laterality: N/A;  . DILATION AND CURETTAGE OF UTERUS    . DILATION AND EVACUATION N/A 05/22/2012   Procedure: DILATATION AND EVACUATION;  Surgeon: Frederico Hamman, MD;  Location: [facility];  Service: Gynecology;  Laterality: N/A;  . FOOT SURGERY     Patient Active Problem List   Diagnosis Date Noted  . COVID-19 virus detected 03/07/2019  . Personal history of multiple sclerosis ([place]) 08/21/2018  . Depression 08/21/2018  . Shortness of breath 08/21/2018  . 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 Abnormal findings on diagnostic imaging of lung 08/21/2018  . Substance abuse ([place]) 08/21/2018  . Cocaine abuse ([place]) 08/21/2018  . Bronchitis, acute 04/03/2018  . Seasonal allergies 01/18/2016  . Cigarette smoker 01/18/2016  . Compliance poor 01/18/2016  . Asthma 12/07/2015  . Goiter diffuse 12/07/2015  . NSVD (normal spontaneous vaginal delivery) 06/13/2015  . Pregnancy induced hypertension 06/12/2015  . Preeclampsia 06/10/2015  . Short cervix affecting pregnancy 02/25/2015  . Advanced maternal age in multigravida 02/12/2015  . Left thyroid nodule 10/25/2011    Current Outpatient Medications:  .  BREO ELLIPTA 100-25 MCG/INH AEPB, Inhale 1 puff into  the lungs daily., Disp: 28 each, Rfl: 5 .  ibuprofen (ADVIL) 800 MG tablet, , Disp: , Rfl:  .  metroNIDAZOLE (FLAGYL) 500 MG tablet, Take 1 tablet (500 mg total) by mouth 2 (two) times daily., Disp: 14 tablet, Rfl: 0 .  SODIUM FLUORIDE 5000 SENSITIVE 1.1-5 % PSTE, See admin instructions., Disp: , Rfl:  No Known Allergies Social History   Tobacco Use  Smoking Status Former Smoker  . Packs/day: 0.75  . Years: 20.00  . Pack years: 15.00  . Types: Cigarettes  . Quit date: 01/31/2018  . Years since quitting: 1.5  Smokeless Tobacco Never Used  Tobacco Comment   marijuana trying to decrease from 20 blunts day to 5 now   Objective:  There were no vitals filed for this visit. Constitutional Patient is a pleasant 44 y.o. African American female, WD, WN in NAD.Marland Kitchen  Vascular Neurovascular status unchanged b/l lower extremities. Capillary refill time to digits immediate b/l. Palpable pedal pulses b/l LE. Pedal hair present. Lower extremity skin temperature gradient within normal limits. No pain with calf compression b/l. No edema noted b/l lower extremities. Capillary refill normal to all digits.  No cyanosis or clubbing noted.  Neurologic Normal speech. Oriented to person, place, and time. Protective sensation intact 5/5 intact bilaterally with 10g monofilament b/l. Vibratory sensation intact b/l.  Dermatologic Pedal skin with normal turgor, texture and tone bilaterally. No open wounds bilaterally. No interdigital macerations bilaterally. Hyperkeratotic keloid scar noted plantarlateral aspect of left foot with tenderness to palpation. No  surrounding erythema, no edema, no drainage.  Orthopedic: Normal muscle strength 5/5 to all lower extremity muscle groups bilaterally. No pain crepitus or joint limitation noted with ROM b/l. No gross bony deformities bilaterally. Patient ambulates independent of any assistive aids.   Radiographs: None Assessment:   1. Painful cutaneous scar    Plan:  -Patient  was evaluated and treated and all questions answered. -Educated on self-care -No new findings. No new orders. -Trimmed painful cutaneious scar to patient tolerance. -Advised continued use of nonmedicated horseshoe or aperture pads for daily protection between visits.  Return in about 3 months (around 10/30/2019) for painful scar.  Marzetta Board, DPM

## 2019-08-06 ENCOUNTER — Other Ambulatory Visit (HOSPITAL_COMMUNITY)
Admission: RE | Admit: 2019-08-06 | Discharge: 2019-08-06 | Disposition: A | Discharge status: Home / Self Care | Payer: Medicaid Other | Source: Ambulatory Visit | Attending: Pulmonary Disease | Admitting: Pulmonary Disease

## 2019-08-06 DIAGNOSIS — Z01812 Encounter for preprocedural laboratory examination: Secondary | ICD-10-CM | POA: Diagnosis present

## 2019-08-06 DIAGNOSIS — Z20822 Contact with and (suspected) exposure to covid-19: Secondary | ICD-10-CM | POA: Diagnosis not present

## 2019-08-06 LAB — SARS CORONAVIRUS 2 (TAT 6-24 HRS): SARS Coronavirus 2: NEGATIVE

## 2019-08-09 ENCOUNTER — Ambulatory Visit: Payer: Medicaid Other | Admitting: Primary Care

## 2019-08-09 ENCOUNTER — Other Ambulatory Visit: Payer: Self-pay

## 2019-08-09 ENCOUNTER — Encounter: Payer: Self-pay | Admitting: Primary Care

## 2019-08-09 ENCOUNTER — Ambulatory Visit (INDEPENDENT_AMBULATORY_CARE_PROVIDER_SITE_OTHER): Payer: Medicaid Other | Admitting: Pulmonary Disease

## 2019-08-09 VITALS — BP 112/80 | HR 78 | Temp 97.3°F | Ht 62.0 in | Wt 135.0 lb

## 2019-08-09 DIAGNOSIS — E041 Nontoxic single thyroid nodule: Secondary | ICD-10-CM

## 2019-08-09 DIAGNOSIS — F419 Anxiety disorder, unspecified: Secondary | ICD-10-CM

## 2019-08-09 DIAGNOSIS — F1910 Other psychoactive substance abuse, uncomplicated: Secondary | ICD-10-CM | POA: Diagnosis not present

## 2019-08-09 DIAGNOSIS — F1290 Cannabis use, unspecified, uncomplicated: Secondary | ICD-10-CM

## 2019-08-09 DIAGNOSIS — J4520 Mild intermittent asthma, uncomplicated: Secondary | ICD-10-CM

## 2019-08-09 DIAGNOSIS — R918 Other nonspecific abnormal finding of lung field: Secondary | ICD-10-CM

## 2019-08-09 DIAGNOSIS — R0602 Shortness of breath: Secondary | ICD-10-CM | POA: Diagnosis not present

## 2019-08-09 DIAGNOSIS — F32A Depression, unspecified: Secondary | ICD-10-CM

## 2019-08-09 DIAGNOSIS — F329 Major depressive disorder, single episode, unspecified: Secondary | ICD-10-CM | POA: Diagnosis not present

## 2019-08-09 LAB — PULMONARY FUNCTION TEST
DL/VA % pred: 98 %
DL/VA: 4.41 ml/min/mmHg/L
DLCO cor % pred: 102 %
DLCO cor: 20.78 ml/min/mmHg
DLCO unc % pred: 106 %
DLCO unc: 21.5 ml/min/mmHg
FEF 25-75 Post: 2.56 L/sec
FEF 25-75 Pre: 1.87 L/sec
FEF2575-%Change-Post: 36 %
FEF2575-%Pred-Post: 97 %
FEF2575-%Pred-Pre: 71 %
FEV1-%Change-Post: 13 %
FEV1-%Pred-Post: 120 %
FEV1-%Pred-Pre: 106 %
FEV1-Post: 2.8 L
FEV1-Pre: 2.47 L
FEV1FVC-%Change-Post: 6 %
FEV1FVC-%Pred-Pre: 87 %
FEV6-%Change-Post: 6 %
FEV6-%Pred-Post: 131 %
FEV6-%Pred-Pre: 123 %
FEV6-Post: 3.64 L
FEV6-Pre: 3.41 L
FEV6FVC-%Pred-Post: 102 %
FEV6FVC-%Pred-Pre: 102 %
FVC-%Change-Post: 6 %
FVC-%Pred-Post: 128 %
FVC-%Pred-Pre: 120 %
FVC-Post: 3.64 L
FVC-Pre: 3.41 L
Post FEV1/FVC ratio: 77 %
Post FEV6/FVC ratio: 100 %
Pre FEV1/FVC ratio: 72 %
Pre FEV6/FVC Ratio: 100 %
RV % pred: 111 %
RV: 1.7 L
TLC % pred: 106 %
TLC: 5.08 L

## 2019-08-09 MED ORDER — BREO ELLIPTA 200-25 MCG/INH IN AEPB: 1.0000 | INHALATION_SPRAY | Freq: Every day | RESPIRATORY_TRACT | 0 refills | Status: DC

## 2019-08-09 NOTE — Patient Instructions (Addendum)
Pulmonary function testing is stable  CT chest showed stable lung nodules, need repeat scan in 12 months  Asthma: TRIAL BREO 200- sample given Strongly recommend you quit smoking  Anxiety: Consider behavioral health referral for anxiety or referral to psych to discuss medication options   Thyroid nodule: You need to follow-up with Dr. Benjamine Mola  Follow-up: 6 months with Dr. Halford Chessman

## 2019-08-09 NOTE — Progress Notes (Signed)
Full PFT performed today. °

## 2019-08-09 NOTE — Progress Notes (Signed)
@Patient  ID: Catherine Osborne, female    DOB: [birth year], 44 y.o.   MRN: [id number]  Chief Complaint  Patient presents with   Follow-up    asthma, covid-19, still SOB    Referring provider: Antony Blackbird, MD  HPI: 44 year old female, recent former smoker (quit 01/31/18). PMH significant for asthma, mild airway obstruction, COVID-19. Patient of Dr. Halford Chessman. Maintained on BREO 100, prn albuterol.   Previous [facility] pulmonary encounter: 04/03/2018 Patient presents today with complaints of shortness of breath.  Accompanied by her younger son. She was recently seen on 2/29 in ED and again on 3/2 at [facility] for URI symptoms. Supportive care recommended and given tessalon perles for cough and proair rescue inhaler.  Complains of cough, shortness of breath and occasional wheezing x1 week. Cough is occasionally productive. Associated chills, sweats and diarrhea. She stopped smoking in January. Reports that her two sons and their father were recently sick. No confirmed influenza or travel. Only using her Breo as needed, has not used recently but reports that she has it in her car. Works at [employer], around children and needs note to be out of work.   04/03/2019 Patient presents today for regular follow-up with PFTs. She was recently dx with COVID-19 on 03/04/19. Plan hold off on pulmonary function testing until 3 months. States that her breathing is worse in the heat, otherwise her asthma symptoms are well controlled. She sleeps with a fan at night. She prefers BREO inhaler to other types. She uses her albuterol rescue inhaler a few times a month. She is currently on an antibiotic right now for mastitis. She is due for repeat CT chest in June 2021 to follow up on multiple pulmonary nodules. She is still smoking marijuana. Denies cough, wheezing, chest tightness, chest pain, fatigue, weight loss, N/V/D.    08/09/2019- interim hx Patient presents today for 3-4 month follow-up. Her respiratory symptoms are  baseline. Reports dyspnea when active. She sweats a lot when she can't breath. She has not required abx or prednisone since last visit. She is on Breo Ellipta 100. Uses her albuterol rescue inhaler prior to heavy exertion. She does not smoke tobacco. She is still smoking marijuana, she smokes 30 cigars in 3-4 days. She smokes to cope with stress and helps her to sleep at night. She had some difficulty with PFTs today. CT chest showed stability of multiple irregular solid pulmonary nodules scatter in the lungs, largest 2.3cm apical left uper lobe. Consistent with benign etiology, recommend follow up 12-18 months for high risk patients. Hypodensity left thyroid. She had Ultrasound in May 2021 and follows with Dr. Benjamine Mola.    Testing reviewed: Spirometry1/09/2018- FVC 3.8 (132%), FEV1 2.0 (85%), ratio 53 Spirometry 11/04/13 >> FEV1 1.86 (76%), FEV1% 64 Allergy test 11/04/13 >> dust mite, cockroach, dogs, trees  Imaging: CT chest 10/05/18 >> b/l irregular nodules up to 2.3 cm CT chest 01/11/19 >> b/l irregular nodules up to 2.1 cm   No Known Allergies  Immunization History  Administered Date(s) Administered   Influenza,inj,Quad PF,6+ Mos 10/31/2016, 01/03/2018, 11/15/2018   Tdap 06/13/2015    Past Medical History:  Diagnosis Date   Allergy    Asthma    Headache    Lactose intolerance in adult    Migraines    Pneumonia    Prior miscarriage with pregnancy in first trimester, antepartum    Vaginal Pap smear, abnormal     Tobacco History: Social History   Tobacco Use  Smoking Status Former  Smoker   Packs/day: 0.75   Years: 20.00   Pack years: 15.00   Types: Cigarettes   Quit date: 01/31/2018   Years since quitting: 1.5  Smokeless Tobacco Never Used  Tobacco Comment   marijuana trying to decrease from 20 blunts day to 5 now   Counseling given: Yes Comment: marijuana trying to decrease from 20 blunts day to 5 now   Outpatient Medications Prior to Visit  Medication  Sig Dispense Refill   BREO ELLIPTA 100-25 MCG/INH AEPB Inhale 1 puff into the lungs daily. 28 each 5   famotidine (PEPCID) 20 MG tablet Take 20 mg by mouth daily. (Patient not taking: Reported on 08/14/2019)     ibuprofen (ADVIL) 800 MG tablet  (Patient not taking: Reported on 08/14/2019)     metroNIDAZOLE (FLAGYL) 500 MG tablet Take 1 tablet (500 mg total) by mouth 2 (two) times daily. (Patient not taking: Reported on 08/14/2019) 14 tablet 0   SODIUM FLUORIDE 5000 SENSITIVE 1.1-5 % PSTE See admin instructions. (Patient not taking: Reported on 08/14/2019)     No facility-administered medications prior to visit.   Review of Systems  Review of Systems  Respiratory: Positive for shortness of breath. Negative for wheezing.    Physical Exam  BP 112/80 (BP Location: Left Arm, Cuff Size: Normal)    Pulse 78    Temp (!) 97.3 F (36.3 C) (Oral)    Ht 5\' 2"  (1.575 m)    Wt 135 lb (61.2 kg)    LMP 07/20/2019    SpO2 98%    BMI 24.69 kg/m  Physical Exam Constitutional:      Appearance: Normal appearance.  HENT:     Head: Normocephalic and atraumatic.     Mouth/Throat:     Mouth: Mucous membranes are moist.     Pharynx: Oropharynx is clear.  Cardiovascular:     Rate and Rhythm: Normal rate and regular rhythm.  Pulmonary:     Effort: Pulmonary effort is normal.     Breath sounds: Normal breath sounds. No wheezing or rhonchi.  Musculoskeletal:        General: Normal range of motion.  Skin:    General: Skin is warm and dry.  Neurological:     General: No focal deficit present.     Mental Status: She is alert and oriented to person, place, and time. Mental status is at baseline.  Psychiatric:        Mood and Affect: Mood normal.        Behavior: Behavior normal.     Comments: Hyperverbal/easily distracted      Lab Results:  CBC    Component Value Date/Time   WBC 5.0 08/16/2019 1052   RBC 4.62 08/16/2019 1052   HGB 13.8 08/16/2019 1052   HGB 14.7 04/22/2019 1209   HCT 40.8  08/16/2019 1052   HCT 44.1 04/22/2019 1209   PLT 251.0 08/16/2019 1052   PLT 308 04/22/2019 1209   MCV 88.3 08/16/2019 1052   MCV 88 04/22/2019 1209   MCH 29.3 04/22/2019 1209   MCH 29.4 09/28/2018 0248   MCHC 33.8 08/16/2019 1052   RDW 14.5 08/16/2019 1052   RDW 13.3 04/22/2019 1209   LYMPHSABS 1.9 08/16/2019 1052   LYMPHSABS 2.3 11/16/2018 1146   MONOABS 0.5 08/16/2019 1052   EOSABS 0.2 08/16/2019 1052   EOSABS 0.2 11/16/2018 1146   BASOSABS 0.1 08/16/2019 1052   BASOSABS 0.1 11/16/2018 1146    BMET    Component Value Date/Time  NA 138 08/16/2019 1052   NA 142 08/14/2019 1619   K 3.8 08/16/2019 1052   CL 104 08/16/2019 1052   CO2 28 08/16/2019 1052   GLUCOSE 84 08/16/2019 1052   BUN 6 08/16/2019 1052   BUN 9 08/14/2019 1619   CREATININE 0.66 08/16/2019 1052   CALCIUM 9.2 08/16/2019 1052   GFRNONAA 102 08/14/2019 1619   GFRAA 117 08/14/2019 1619    BNP No results found for: BNP  ProBNP No results found for: PROBNP  Imaging: No results found.   Assessment & Plan:   Asthma - No significant changes in her breathing, continues to smoke marijuana. She reports moderate dyspnea with activity - Pulmonary function testing is stable  - Trial Breo Ellipta 200 one puff daily (rinse mouth after use)  Marijuana use - Active smoker. Strongly encourage complete smoking cessation. Counseling/support given.  Pulmonary nodules - CT chest in June 2021 showed continued stability of multiple irregular solid pulmonary nodules scattered in both lungs, largest 2.3 cm in the apical left upper lobe - Repeat CT chest in 12-18 months d/t high risk patient   Left thyroid nodule - Refer back to Dr. Benjamine Mola  Anxiety - Anxiety is driving factor in Marijuana use - Discussed referral to behavioral health and/or psychiatry to discuss management options      Martyn Ehrich, NP 08/21/2019

## 2019-08-09 NOTE — Progress Notes (Signed)
Will be discussed today in office with EW NP

## 2019-08-14 ENCOUNTER — Encounter: Payer: Self-pay | Admitting: Family Medicine

## 2019-08-14 ENCOUNTER — Other Ambulatory Visit: Payer: Self-pay

## 2019-08-14 ENCOUNTER — Ambulatory Visit: Payer: Medicaid Other | Attending: Family Medicine | Admitting: Family Medicine

## 2019-08-14 VITALS — BP 121/84 | HR 85 | Ht 62.0 in | Wt 138.0 lb

## 2019-08-14 DIAGNOSIS — R519 Headache, unspecified: Secondary | ICD-10-CM

## 2019-08-14 DIAGNOSIS — R197 Diarrhea, unspecified: Secondary | ICD-10-CM | POA: Diagnosis not present

## 2019-08-14 NOTE — Patient Instructions (Signed)

## 2019-08-14 NOTE — Progress Notes (Signed)
Subjective:  [name], female    DOB: 1975/05/09  Age: 44 y.o. MRN: [id number]  CC: Multiple Sclerosis   HPI AUDRIS SPEAKER is a 44 year old female patient of Dr. Chapman Fitch who Presents for a follow-up visit.  Medical history is significant for headaches Placed on Nortriptylline which she never took; states she does not like to take medications but is awaiting MRI which has been scheduled by neurology to exclude multiple sclerosis given she had reported previous diagnosis of multiple sclerosis. She is scheduled for MRI of the brain 08/29/19.  She is on Breo which has been beneficial for her asthma.  Last seen by pulmonary last week.  States she would like to see GI as she is unable to hold anything post meals for the last week. She had a COVID test which was negative. Denies blood in stools; she goes 4-5 times and it is real watery. No nausea, vomiting.  Denies presence of abdominal cramping.  Endorses eating fast foods but states that she is the only one with such symptoms in her house at the moment. LMP was on 6/19. Her gastroenterologist is [facility]. Past Medical History:  Diagnosis Date  . Allergy   . Asthma   . Headache   . 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 Migraines   . Pneumonia   . Prior miscarriage with pregnancy in first trimester, antepartum   . Vaginal Pap smear, abnormal     Past Surgical History:  Procedure Laterality Date  . CERVICAL CERCLAGE N/A 02/26/2015   Procedure: CERCLAGE CERVICAL;  Surgeon: Frederico Hamman, MD;  Location: [facility];  Service: Gynecology;  Laterality: N/A;  . DILATION AND CURETTAGE OF UTERUS    . DILATION AND EVACUATION N/A 05/22/2012   Procedure: DILATATION AND EVACUATION;  Surgeon: Frederico Hamman, MD;  Location: [facility];  Service: Gynecology;  Laterality: N/A;  . FOOT SURGERY      Family History  Adopted: Yes  Problem Relation Age of Onset  . Bone cancer Mother   . Breast cancer Maternal Grandmother   . Diabetes  Other   . Hypertension Other   . Stroke Other   . Heart attack Other     No Known Allergies  Outpatient Medications Prior to Visit  Medication Sig Dispense Refill  . BREO ELLIPTA 100-25 MCG/INH AEPB Inhale 1 puff into the lungs daily. 28 each 5  . fluticasone furoate-vilanterol (BREO ELLIPTA) 200-25 MCG/INH AEPB Inhale 1 puff into the lungs daily. 4 each 0  . famotidine (PEPCID) 20 MG tablet Take 20 mg by mouth daily. (Patient not taking: Reported on 08/14/2019)    . ibuprofen (ADVIL) 800 MG tablet  (Patient not taking: Reported on 08/14/2019)    . metroNIDAZOLE (FLAGYL) 500 MG tablet Take 1 tablet (500 mg total) by mouth 2 (two) times daily. (Patient not taking: Reported on 08/14/2019) 14 tablet 0  . SODIUM FLUORIDE 5000 SENSITIVE 1.1-5 % PSTE See admin instructions. (Patient not taking: Reported on 08/14/2019)     No facility-administered medications prior to visit.     ROS Review of Systems  Constitutional: Negative for activity change, appetite change and fatigue.  HENT: Negative for congestion, sinus pressure and sore throat.   Eyes: Negative for visual disturbance.  Respiratory: Negative for cough, chest tightness, shortness of breath and wheezing.   Cardiovascular: Negative for chest pain and palpitations.  Gastrointestinal: Positive for diarrhea. Negative for abdominal distention, abdominal pain and constipation.  Endocrine: Negative for polydipsia.  Genitourinary: Negative for dysuria and frequency.  Musculoskeletal: Negative for arthralgias and back pain.  Skin: Negative for rash.  Neurological: Negative for tremors, light-headedness and numbness.  Hematological: Does not bruise/bleed easily.  Psychiatric/Behavioral: Negative for agitation and behavioral problems.    Objective:  BP 121/84   Pulse 85   Ht 5\' 2"  (1.575 m)   Wt 138 lb (62.6 kg)   SpO2 99%   BMI 25.24 kg/m   BP/Weight 08/14/2019 08/09/2019 8/84/1660  Systolic BP 630 160 109  Diastolic BP 84 80 81  Wt.  (Lbs) 138 135 134  BMI 25.24 24.69 24.51      Physical Exam Constitutional:      Appearance: She is well-developed.  Neck:     Vascular: No JVD.  Cardiovascular:     Rate and Rhythm: Normal rate.     Heart sounds: Normal heart sounds. No murmur heard.   Pulmonary:     Effort: Pulmonary effort is normal.     Breath sounds: Normal breath sounds. No wheezing or rales.  Chest:     Chest wall: No tenderness.  Abdominal:     General: Bowel sounds are normal. There is no distension.     Palpations: Abdomen is soft. There is no mass.     Tenderness: There is no abdominal tenderness.  Musculoskeletal:        General: Normal range of motion.     Right lower leg: No edema.     Left lower leg: No edema.  Neurological:     Mental Status: She is alert and oriented to person, place, and time.  Psychiatric:        Mood and Affect: Mood normal.     CMP Latest Ref Rng & Units 08/14/2019 04/22/2019 11/16/2018  Glucose 65 - 99 mg/dL 86 80 78  BUN 6 - 24 mg/dL 9 6 10   Creatinine 0.57 - 1.00 mg/dL 0.73 0.69 0.68  Sodium 134 - 144 mmol/L 142 138 138  Potassium 3.5 - 5.2 mmol/L 4.1 4.0 3.9  Chloride 96 - 106 mmol/L 108(H) 102 103  CO2 20 - 29 mmol/L 22 25 23   Calcium 8.7 - 10.2 mg/dL 9.0 9.2 9.7  Total Protein 6.0 - 8.5 g/dL - 6.8 7.3  Total Bilirubin 0.0 - 1.2 mg/dL - 0.3 0.5  Alkaline Phos 39 - 117 IU/L - 66 70  AST 0 - 40 IU/L - 19 22  ALT 0 - 32 IU/L - 21 24    Lipid Panel  No results found for: CHOL, TRIG, HDL, CHOLHDL, VLDL, LDLCALC, LDLDIRECT  CBC    Component Value Date/Time   WBC 5.2 04/22/2019 1209   WBC 7.9 09/28/2018 0248   RBC 5.02 04/22/2019 1209   RBC 5.03 09/28/2018 0248   HGB 14.7 04/22/2019 1209   HCT 44.1 04/22/2019 1209   PLT 308 04/22/2019 1209   MCV 88 04/22/2019 1209   MCH 29.3 04/22/2019 1209   MCH 29.4 09/28/2018 0248   MCHC 33.3 04/22/2019 1209   MCHC 33.4 09/28/2018 0248   RDW 13.3 04/22/2019 1209   LYMPHSABS 2.3 11/16/2018 1146   MONOABS 0.7  07/05/2018 2108   EOSABS 0.2 11/16/2018 1146   BASOSABS 0.1 11/16/2018 1146    Lab Results  Component Value Date   HGBA1C 5.2 11/16/2018    Assessment & Plan:  1. Diarrhea, unspecified type Suspicious for viral etiology; she appears stable with no evidence of dehydration She is insisting on seeing GI Advised to increase fluid intake, comply  with BRAT diet I will check her electrolytes and advised that if symptoms persist she can contact the office for an antimotility agent prescription She was seen by [facility] last year and knows she can call them for appointment - Basic Metabolic Panel  2. Frequent headaches Prescribed nortriptyline by neurology which she has not taken yet Awaiting MRI Follow-up with neurology.   Follow-up: Return in about 6 months (around 02/14/2020) for PCP -coordination of care.       Charlott Rakes, MD, FAAFP. [facility] [place], [place]   08/15/2019, 8:22 AM

## 2019-08-15 ENCOUNTER — Other Ambulatory Visit: Payer: Self-pay

## 2019-08-15 ENCOUNTER — Other Ambulatory Visit (HOSPITAL_COMMUNITY)
Admission: RE | Admit: 2019-08-15 | Discharge: 2019-08-15 | Disposition: A | Discharge status: Home / Self Care | Payer: Medicaid Other | Source: Ambulatory Visit | Attending: Obstetrics | Admitting: Obstetrics

## 2019-08-15 ENCOUNTER — Encounter: Payer: Self-pay | Admitting: Obstetrics

## 2019-08-15 ENCOUNTER — Ambulatory Visit (INDEPENDENT_AMBULATORY_CARE_PROVIDER_SITE_OTHER): Payer: Medicaid Other | Admitting: Obstetrics

## 2019-08-15 VITALS — BP 116/78 | HR 84 | Ht 62.0 in | Wt 136.5 lb

## 2019-08-15 DIAGNOSIS — Z113 Encounter for screening for infections with a predominantly sexual mode of transmission: Secondary | ICD-10-CM | POA: Diagnosis not present

## 2019-08-15 DIAGNOSIS — N898 Other specified noninflammatory disorders of vagina: Secondary | ICD-10-CM

## 2019-08-15 DIAGNOSIS — Z01419 Encounter for gynecological examination (general) (routine) without abnormal findings: Secondary | ICD-10-CM

## 2019-08-15 LAB — BASIC METABOLIC PANEL
BUN/Creatinine Ratio: 12 (ref 9–23)
BUN: 9 mg/dL (ref 6–24)
CO2: 22 mmol/L (ref 20–29)
Calcium: 9 mg/dL (ref 8.7–10.2)
Chloride: 108 mmol/L — ABNORMAL HIGH (ref 96–106)
Creatinine, Ser: 0.73 mg/dL (ref 0.57–1.00)
GFR calc Af Amer: 117 mL/min/{1.73_m2} (ref >59)
GFR calc non Af Amer: 102 mL/min/{1.73_m2} (ref >59)
Glucose: 86 mg/dL (ref 65–99)
Potassium: 4.1 mmol/L (ref 3.5–5.2)
Sodium: 142 mmol/L (ref 134–144)

## 2019-08-15 NOTE — Progress Notes (Signed)
Subjective:        Catherine Osborne is a 44 y.o. female here for a routine exam.  Current complaints: None.    Personal health questionnaire:  Is patient [name], have a family history of breast and/or ovarian cancer: no Is there a family history of uterine cancer diagnosed at age < 15, gastrointestinal cancer, urinary tract cancer, family member who is a Field seismologist syndrome-associated carrier: no Is the patient overweight and hypertensive, family history of diabetes, personal history of gestational diabetes, preeclampsia or PCOS: no Is patient over 29, have PCOS,  family history of premature CHD under age 69, diabetes, smoke, have hypertension or peripheral artery disease:  no At any time, has a partner hit, kicked or otherwise hurt or frightened you?: no Over the past 2 weeks, have you felt down, depressed or hopeless?: no Over the past 2 weeks, have you felt little interest or pleasure in doing things?:no   Gynecologic History Patient's last menstrual period was 07/20/2019. Contraception: none Last Pap: 01-03-2018. Results were: normal Last mammogram: 04-23-2019. Results were: normal  Obstetric History OB History  Gravida Para Term Preterm AB Living  14 2 1 1 12 1   SAB TAB Ectopic Multiple Live Births  10 2   0 1    # Outcome Date GA Lbr Len/2nd Weight Sex Delivery Anes PTL Lv  14 Term 06/12/15 [redacted]w[redacted]d 19:15 / 00:19 7 lb 0.7 oz (3.196 kg) M Vag-Spont EPI  LIV  13 Preterm  [redacted]w[redacted]d    Vag-Spont     12 SAB           11 SAB           10 SAB           9 SAB           8 SAB           7 SAB           6 SAB           5 SAB           4 SAB           3 TAB           2 TAB           1 SAB             Past Medical History:  Diagnosis Date  . Allergy   . Asthma   . Headache   . 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 Migraines   . Pneumonia   . Prior miscarriage with pregnancy in first trimester, antepartum   . Vaginal Pap smear, abnormal     Past Surgical History:   Procedure Laterality Date  . CERVICAL CERCLAGE N/A 02/26/2015   Procedure: CERCLAGE CERVICAL;  Surgeon: Frederico Hamman, MD;  Location: [facility];  Service: Gynecology;  Laterality: N/A;  . DILATION AND CURETTAGE OF UTERUS    . DILATION AND EVACUATION N/A 05/22/2012   Procedure: DILATATION AND EVACUATION;  Surgeon: Frederico Hamman, MD;  Location: [facility];  Service: Gynecology;  Laterality: N/A;  . FOOT SURGERY       Current Outpatient Medications:  .  BREO ELLIPTA 100-25 MCG/INH AEPB, Inhale 1 puff into the lungs daily. (Patient not taking: Reported on 08/15/2019), Disp: 28 each, Rfl: 5 .  famotidine (PEPCID) 20 MG tablet, Take 20 mg by mouth daily. (Patient not taking: Reported on 08/14/2019), Disp: , Rfl:  .  fluticasone furoate-vilanterol (BREO ELLIPTA) 200-25 MCG/INH AEPB, Inhale 1 puff into the lungs daily. (Patient not taking: Reported on 08/15/2019), Disp: 4 each, Rfl: 0 .  ibuprofen (ADVIL) 800 MG tablet, , Disp: , Rfl:  .  metroNIDAZOLE (FLAGYL) 500 MG tablet, Take 1 tablet (500 mg total) by mouth 2 (two) times daily. (Patient not taking: Reported on 08/14/2019), Disp: 14 tablet, Rfl: 0 .  SODIUM FLUORIDE 5000 SENSITIVE 1.1-5 % PSTE, See admin instructions. (Patient not taking: Reported on 08/14/2019), Disp: , Rfl:  No Known Allergies  Social History   Tobacco Use  . Smoking status: Former Smoker    Packs/day: 0.75    Years: 20.00    Pack years: 15.00    Types: Cigarettes    Quit date: 01/31/2018    Years since quitting: 1.5  . Smokeless tobacco: Never Used  . Tobacco comment: marijuana trying to decrease from 20 blunts day to 5 now  Substance Use Topics  . Alcohol use: Yes    Alcohol/week: 1.0 standard drink    Types: 1 Glasses of wine per week    Comment: rare - no liquor since april 2020/ drinks wine./ update 04/22/19 drinks at special occasions      Family History  Adopted: Yes  Problem Relation Age of Onset  . Bone cancer Mother   . Breast cancer Maternal Grandmother    . Diabetes Other   . Hypertension Other   . Stroke Other   . Heart attack Other       Review of Systems  Constitutional: negative for fatigue and weight loss Respiratory: negative for cough and wheezing Cardiovascular: negative for chest pain, fatigue and palpitations Gastrointestinal: negative for abdominal pain and change in bowel habits Musculoskeletal:negative for myalgias Neurological: negative for gait problems and tremors Behavioral/Psych: negative for abusive relationship, depression Endocrine: negative for temperature intolerance    Genitourinary:negative for abnormal menstrual periods, genital lesions, hot flashes, sexual problems and vaginal discharge Integument/breast: negative for breast lump, breast tenderness, nipple discharge and skin lesion(s)    Objective:       BP 116/78   Pulse 84   Ht 5\' 2"  (1.575 m)   Wt 136 lb 8 oz (61.9 kg)   LMP 07/20/2019   BMI 24.97 kg/m  General:   alert  Skin:   no rash or abnormalities  Lungs:   clear to auscultation bilaterally  Heart:   regular rate and rhythm, S1, S2 normal, no murmur, click, rub or gallop  Breasts:   normal without suspicious masses, skin or nipple changes or axillary nodes  Abdomen:  normal findings: no organomegaly, soft, non-tender and no hernia  Pelvis:  External genitalia: normal general appearance Urinary system: urethral meatus normal and bladder without fullness, nontender Vaginal: normal without tenderness, induration or masses Cervix: normal appearance Adnexa: normal bimanual exam Uterus: anteverted and non-tender, normal size   Lab Review Urine pregnancy test Labs reviewed yes Radiologic studies reviewed yes  50% of 20 min visit spent on counseling and coordination of care.   Assessment:     1. Encounter for gynecological examination with Papanicolaou smear of cervix Rx: - Cytology - PAP( [place])  2. Vaginal discharge Rx: - Cervicovaginal ancillary only( [place])  3.  Screen for STD (sexually transmitted disease) Rx: - HIV antibody (with reflex) - RPR - Hepatitis C Antibody - Hepatitis B Surface AntiGEN    Plan:    Education reviewed: calcium supplements, depression evaluation, low fat, low cholesterol diet, safe sex/STD prevention, self  breast exams and weight bearing exercise. Contraception: none. Follow up in: 1 year.    Orders Placed This Encounter  Procedures  . HIV antibody (with reflex)  . RPR  . Hepatitis C Antibody  . Hepatitis B Surface AntiGEN    Shelly Bombard, MD 08/15/2019 9:51 AM

## 2019-08-15 NOTE — Progress Notes (Signed)
Pt is in the office for annual. Last pap 01-03-18 LMP 07-20-19 Pt states that she does not want BC, reports white vaginal discharge, wants full std testing.

## 2019-08-16 ENCOUNTER — Other Ambulatory Visit (INDEPENDENT_AMBULATORY_CARE_PROVIDER_SITE_OTHER): Payer: Medicaid Other

## 2019-08-16 ENCOUNTER — Encounter: Payer: Self-pay | Admitting: Nurse Practitioner

## 2019-08-16 ENCOUNTER — Ambulatory Visit: Payer: Medicaid Other | Admitting: Nurse Practitioner

## 2019-08-16 VITALS — BP 118/76 | HR 89 | Ht 62.0 in | Wt 138.1 lb

## 2019-08-16 DIAGNOSIS — A09 Infectious gastroenteritis and colitis, unspecified: Secondary | ICD-10-CM

## 2019-08-16 DIAGNOSIS — R1084 Generalized abdominal pain: Secondary | ICD-10-CM

## 2019-08-16 DIAGNOSIS — R197 Diarrhea, unspecified: Secondary | ICD-10-CM | POA: Diagnosis not present

## 2019-08-16 DIAGNOSIS — K909 Intestinal malabsorption, unspecified: Secondary | ICD-10-CM

## 2019-08-16 LAB — COMPREHENSIVE METABOLIC PANEL
ALT: 17 U/L (ref 0–35)
AST: 17 U/L (ref 0–37)
Albumin: 4.2 g/dL (ref 3.5–5.2)
Alkaline Phosphatase: 57 U/L (ref 39–117)
BUN: 6 mg/dL (ref 6–23)
CO2: 28 mEq/L (ref 19–32)
Calcium: 9.2 mg/dL (ref 8.4–10.5)
Chloride: 104 mEq/L (ref 96–112)
Creatinine, Ser: 0.66 mg/dL (ref 0.40–1.20)
GFR: 118.51 mL/min (ref >60.00)
Glucose, Bld: 84 mg/dL (ref 70–99)
Potassium: 3.8 mEq/L (ref 3.5–5.1)
Sodium: 138 mEq/L (ref 135–145)
Total Bilirubin: 0.5 mg/dL (ref 0.2–1.2)
Total Protein: 6.9 g/dL (ref 6.0–8.3)

## 2019-08-16 LAB — CBC WITH DIFFERENTIAL/PLATELET
Basophils Absolute: 0.1 10*3/uL (ref 0.0–0.1)
Basophils Relative: 1.8 % (ref 0.0–3.0)
Eosinophils Absolute: 0.2 10*3/uL (ref 0.0–0.7)
Eosinophils Relative: 3.9 % (ref 0.0–5.0)
HCT: 40.8 % (ref 36.0–46.0)
Hemoglobin: 13.8 g/dL (ref 12.0–15.0)
Lymphocytes Relative: 37.4 % (ref 12.0–46.0)
Lymphs Abs: 1.9 10*3/uL (ref 0.7–4.0)
MCHC: 33.8 g/dL (ref 30.0–36.0)
MCV: 88.3 fl (ref 78.0–100.0)
Monocytes Absolute: 0.5 10*3/uL (ref 0.1–1.0)
Monocytes Relative: 9.9 % (ref 3.0–12.0)
Neutro Abs: 2.3 10*3/uL (ref 1.4–7.7)
Neutrophils Relative %: 47 % (ref 43.0–77.0)
Platelets: 251 10*3/uL (ref 150.0–400.0)
RBC: 4.62 Mil/uL (ref 3.87–5.11)
RDW: 14.5 % (ref 11.5–15.5)
WBC: 5 10*3/uL (ref 4.0–10.5)

## 2019-08-16 LAB — CERVICOVAGINAL ANCILLARY ONLY
Bacterial Vaginitis (gardnerella): POSITIVE — AB
Candida Glabrata: NEGATIVE
Candida Vaginitis: NEGATIVE
Chlamydia: NEGATIVE
Comment: NEGATIVE
Comment: NEGATIVE
Comment: NEGATIVE
Comment: NEGATIVE
Comment: NEGATIVE
Comment: NORMAL
Neisseria Gonorrhea: NEGATIVE
Trichomonas: NEGATIVE

## 2019-08-16 LAB — HEPATITIS B SURFACE ANTIGEN: Hepatitis B Surface Ag: NEGATIVE

## 2019-08-16 LAB — HEPATITIS C ANTIBODY: Hep C Virus Ab: <0.1 s/co ratio (ref 0.0–0.9)

## 2019-08-16 LAB — HIV ANTIBODY (ROUTINE TESTING W REFLEX): HIV Screen 4th Generation wRfx: NONREACTIVE

## 2019-08-16 LAB — RPR: RPR Ser Ql: NONREACTIVE

## 2019-08-16 MED ORDER — DICYCLOMINE HCL 10 MG PO CAPS
10.0000 mg | ORAL_CAPSULE | Freq: Two times a day (BID) | ORAL | 0 refills | Status: DC
Start: 2019-08-16 — End: 2020-01-20

## 2019-08-16 NOTE — Progress Notes (Signed)
     08/16/2019 [name] [id number] 1975-10-14   Chief Complaint: Stomach pain and diarrhea   History of Present Illness: Keaton Stirewalt is a 44 year old female with a past medical history of asthma, eadaches, polysubstance abuse, bilateral lung nodules and lactose intolerance. She presents today with complaints of having stomach pain and diarrhea. She describes having nonbloody runny stools 5 times daily which occurs shortly after eating most most foods x 1 week. She passed nonbloody mucous per the rectum once last week. She recently took Flagly po x 14 days for bacterial vaginitis. To start taking Tindamax 500mg  2 tabs x 5 days.  Prior to the onset of diarrhea, she passed normal formed stools, sometimes had constipation. No rectal bleeding or melena. She drinks Dr. Malachi Bonds 16 oz most days. She eats fast foods daily. She smokes marijuana daily. She binge drinks. One day last week she drank 3 mixed drinks and one bottle of wine. History of colon polyps. She underwent a colonoscopy colonoscopy July 01, 2016 which identified 2 sessile polyps (1 of the polyps was not retrieved, and the other was hyperplastic) removed from transverse colon and rectum, internal hemorrhoids.   Current Medications, Allergies, Past Medical History, Past Surgical History, Family History and Social History were reviewed in [employer] record.   Physical Exam: BP 118/76   Pulse 89   Ht 5\' 2"  (1.575 m)   Wt 138 lb 2 oz (62.7 kg)   LMP 07/20/2019   BMI 25.26 kg/m  General: Well developed 44 year old female in no acute distress. Head: Normocephalic and atraumatic. Eyes: No scleral icterus. Conjunctiva pink . Ears: Normal auditory acuity. Mouth: Dentition intact. No ulcers or lesions.  Neck: Thyromegaly, R  Thyroid lobe  >  L. Lungs: Clear throughout to auscultation. Heart: Regular rate and rhythm, no murmur. Abdomen: Soft, nontender. Mild generalized tenderness throughout without  rebound or guarding. No masses or hepatomegaly. Normal bowel sounds x 4 quadrants.  Rectal: Deferred.  Musculoskeletal: Symmetrical with no gross deformities. Extremities: No edema. Neurological: Alert oriented x 4. No focal deficits.  Psychological: Alert and cooperative. Normal mood and affect  Assessment and Recommendations:  1. Generalized abdominal pain and diarrhea x 1 week, Recent antibiotic use.  -GI pathogen panel, CBC CMP -Reduce Dr. Malachi Bonds intake, avoid fatty/fried fods -Dicyclomine 10mg  one po bid take one tab before meals bid for a few days then PRN -Patient to call our office symptoms worsen   2. History of a hyperplastic polyps -Next colonoscopy due 07/2026, earlier if symptoms warrant   3. History of poly substance abuse, smokes marijuana daily and binge EtOH use   4. Follow up with PCP regarding enlarged thyroid

## 2019-08-16 NOTE — Patient Instructions (Signed)
If you are age 43 or older, your body mass index should be between 23-30. Your Body mass index is 25.26 kg/m. If this is out of the aforementioned range listed, please consider follow up with your Primary Care Provider.  If you are age 41 or younger, your body mass index should be between 19-25. Your Body mass index is 25.26 kg/m. If this is out of the aformentioned range listed, please consider follow up with your Primary Care Provider.   We have sent the following medications to your pharmacy for you to pick up at your convenience:  Dicyclomine 10mg   Reduce your Dr Malachi Bonds intake. Avoid fatty greasy foods. Eat a bland diet.  Call our office if your symptoms worsen  Your provider has requested that you go to the basement level for lab work before leaving today. Press "B" on the elevator. The lab is located at the first door on the left as you exit the elevator.  Due to recent changes in healthcare laws, you may see the results of your imaging and laboratory studies on MyChart before your provider has had a chance to review them.  We understand that in some cases there may be results that are confusing or concerning to you. Not all laboratory results come back in the same time frame and the provider may be waiting for multiple results in order to interpret others.  Please give [place] 48 hours in order for your provider to thoroughly review all the results before contacting the office for clarification of your results.

## 2019-08-17 ENCOUNTER — Other Ambulatory Visit: Payer: Self-pay | Admitting: Obstetrics

## 2019-08-17 DIAGNOSIS — N760 Acute vaginitis: Secondary | ICD-10-CM

## 2019-08-17 MED ORDER — TINIDAZOLE 500 MG PO TABS: 1000.0000 mg | ORAL_TABLET | Freq: Every day | ORAL | 2 refills | Status: DC

## 2019-08-20 LAB — CYTOLOGY - PAP
Comment: NEGATIVE
Diagnosis: NEGATIVE
High risk HPV: NEGATIVE

## 2019-08-21 ENCOUNTER — Encounter: Payer: Self-pay | Admitting: Primary Care

## 2019-08-21 DIAGNOSIS — F1290 Cannabis use, unspecified, uncomplicated: Secondary | ICD-10-CM | POA: Insufficient documentation

## 2019-08-21 DIAGNOSIS — F419 Anxiety disorder, unspecified: Secondary | ICD-10-CM | POA: Insufficient documentation

## 2019-08-21 NOTE — Assessment & Plan Note (Signed)
-   CT chest in June 2021 showed continued stability of multiple irregular solid pulmonary nodules scattered in both lungs, largest 2.3 cm in the apical left upper lobe - Repeat CT chest in 12-18 months d/t high risk patient

## 2019-08-21 NOTE — Assessment & Plan Note (Signed)
-   Anxiety is driving factor in Marijuana use - Discussed referral to behavioral health and/or psychiatry to discuss management options

## 2019-08-21 NOTE — Progress Notes (Signed)
Reviewed and agree with assessment/plan.   Chesley Mires, MD [facility] Pulmonary/Critical Care 08/21/2019, 2:56 PM Pager:  [phone]

## 2019-08-21 NOTE — Assessment & Plan Note (Deleted)
-   Active marijuana smoker. Strongly encourage complete smoking cessation. Counseling/support given. - CT chest showed stable lung nodules, need repeat scan in 12 months d/t patient being high risk

## 2019-08-21 NOTE — Assessment & Plan Note (Addendum)
-   Active smoker. Strongly encourage complete smoking cessation. Counseling/support given.

## 2019-08-21 NOTE — Assessment & Plan Note (Signed)
-   Refer back to Dr. Benjamine Mola

## 2019-08-21 NOTE — Assessment & Plan Note (Addendum)
-   No significant changes in her breathing, continues to smoke marijuana. She reports moderate dyspnea with activity - Pulmonary function testing is stable  - Trial Breo Ellipta 200 one puff daily (rinse mouth after use)

## 2019-08-26 ENCOUNTER — Other Ambulatory Visit: Payer: Self-pay

## 2019-08-26 ENCOUNTER — Ambulatory Visit
Admission: RE | Admit: 2019-08-26 | Discharge: 2019-08-26 | Disposition: A | Discharge status: Home / Self Care | Payer: Medicaid Other | Source: Ambulatory Visit | Attending: Family Medicine | Admitting: Family Medicine

## 2019-08-26 DIAGNOSIS — H539 Unspecified visual disturbance: Secondary | ICD-10-CM

## 2019-08-26 DIAGNOSIS — R51 Headache with orthostatic component, not elsewhere classified: Secondary | ICD-10-CM

## 2019-08-26 MED ORDER — GADOBENATE DIMEGLUMINE 529 MG/ML IV SOLN
12.0000 mL | Freq: Once | INTRAVENOUS | Status: AC | PRN
  Administered 2019-08-26: 12 mL via INTRAVENOUS

## 2019-08-28 ENCOUNTER — Telehealth: Payer: Self-pay | Admitting: *Deleted

## 2019-08-28 DIAGNOSIS — R51 Headache with orthostatic component, not elsewhere classified: Secondary | ICD-10-CM

## 2019-08-28 DIAGNOSIS — H539 Unspecified visual disturbance: Secondary | ICD-10-CM

## 2019-08-28 NOTE — Telephone Encounter (Signed)
LMVM for pt that calling with MRI results.  Will email her with those on mychart.  She is to respond back if questions.

## 2019-08-28 NOTE — Telephone Encounter (Signed)
-----   Message from Melvenia Beam, MD sent at 08/28/2019  1:08 PM EDT ----- MRI of the brain is normal but she has a left sphenoid sinus mucocele(ball of mucous) which might contribute to headache. Would she be willing to hav ENT evaluate this? I send Dr. Lucia Gaskins an email about it to see if her thought it was significant (he is an ENT) but he has not returned my email yet. thanks

## 2019-08-29 ENCOUNTER — Telehealth: Payer: Self-pay | Admitting: Primary Care

## 2019-08-29 MED ORDER — BREO ELLIPTA 200-25 MCG/INH IN AEPB
1.0000 | INHALATION_SPRAY | Freq: Every day | RESPIRATORY_TRACT | 0 refills | Status: DC
Start: 2019-08-29 — End: 2020-01-20

## 2019-08-29 NOTE — Telephone Encounter (Signed)
Thank you :)

## 2019-08-29 NOTE — Telephone Encounter (Signed)
I gave her a sample of 200. If that has been helpful please send in BREO 200 and d/c BREO 100.

## 2019-08-29 NOTE — Telephone Encounter (Signed)
Catherine Osborne patient would like RX sent in to her pharmacy for [employer]. On her med list it says Breo 100 and in your notes from last OV it says Breo 200 can you please clarify which one for me to send in?

## 2019-08-30 ENCOUNTER — Other Ambulatory Visit: Payer: Medicaid Other

## 2019-09-03 ENCOUNTER — Other Ambulatory Visit: Payer: Medicaid Other

## 2019-09-03 DIAGNOSIS — R197 Diarrhea, unspecified: Secondary | ICD-10-CM

## 2019-09-03 DIAGNOSIS — K909 Intestinal malabsorption, unspecified: Secondary | ICD-10-CM

## 2019-09-03 DIAGNOSIS — A09 Infectious gastroenteritis and colitis, unspecified: Secondary | ICD-10-CM

## 2019-09-03 DIAGNOSIS — R1084 Generalized abdominal pain: Secondary | ICD-10-CM

## 2019-09-03 NOTE — Progress Notes (Signed)
Reviewed and agree with documentation and assessment and plan. K. Veena Amory Simonetti , MD   

## 2019-09-05 NOTE — Telephone Encounter (Signed)
I called pt and relayed per Dr. Jaynee Eagles that MRI normal, with Left sphenoid sinus mucocele which may contribute to headache.  She is not on medications. Would like to if ok per AL/NP  as discussed when in visit after MRI done.  She would like to see Dr. Benjamine Mola ENT.  Referral placed.

## 2019-09-05 NOTE — Addendum Note (Signed)
Addended by: Brandon Melnick on: 09/05/2019 01:44 PM   Modules accepted: Orders

## 2019-09-05 NOTE — Telephone Encounter (Signed)
At her last follow up she stated that headaches were improving and not occurring daily. I advised that we not start any medications at that time. Have headaches worsened? The mucous ball could be contributing. I would recommend she try Mucinex ER OTC for 2 weeks to see if this helps. Flonase and antihistamines (Zyrtec, Xyzal) may also help. If headaches are worsening, I would be happy to bring her back in to discuss preventative options.

## 2019-09-06 LAB — GI PROFILE, STOOL, PCR

## 2019-10-04 ENCOUNTER — Other Ambulatory Visit (HOSPITAL_COMMUNITY): Payer: Self-pay | Admitting: Otolaryngology

## 2019-10-04 DIAGNOSIS — E041 Nontoxic single thyroid nodule: Secondary | ICD-10-CM

## 2019-10-06 ENCOUNTER — Other Ambulatory Visit: Payer: Self-pay | Admitting: Family Medicine

## 2019-10-06 DIAGNOSIS — E559 Vitamin D deficiency, unspecified: Secondary | ICD-10-CM

## 2019-10-06 NOTE — Telephone Encounter (Signed)
Requested medication (s) are due for refill today: Yes  Requested medication (s) are on the active medication list: No  Last refill:  11/2018  Future visit scheduled: No  Notes to clinic: Removed from list 04/2019 visit with Neurology, unsure if provider wants renewal     Requested Prescriptions  Pending Prescriptions Disp Refills   Vitamin D, Ergocalciferol, (DRISDOL) 1.25 MG (50000 UNIT) CAPS capsule [Pharmacy Med Name: VITAMIN D2 50,000IU (ERGO) CAP RX] 24 capsule 0    Sig: TAKE 1 CAPSULE BY MOUTH 2 [place]      Endocrinology:  Vitamins - Vitamin D Supplementation Failed - 10/06/2019  6:57 PM      Failed - 50,000 IU strengths are not delegated      Failed - Phosphate in normal range and within 360 days    No results found for: PHOS        Failed - Vitamin D in normal range and within 360 days    Vit D, 25-Hydroxy  Date Value Ref Range Status  11/16/2018 9.0 (L) 30.0 - 100.0 ng/mL Final    Comment:    Vitamin D deficiency has been defined by the Institute of Medicine and an Endocrine Society practice guideline as a level of serum 25-OH vitamin D less than 20 ng/mL (1,2). The Endocrine Society went on to further define vitamin D insufficiency as a level between 21 and 29 ng/mL (2). 1. IOM (Institute of Medicine). 2010. Dietary reference    intakes for calcium and D. [place]: The    [employer]. 2. Holick MF, Binkley [place], Bischoff-Ferrari HA, et al.    Evaluation, treatment, and prevention of vitamin D    deficiency: an Endocrine Society clinical practice    guideline. JCEM. 2011 Jul; 96(7):1911-30.           Passed - Ca in normal range and within 360 days    Calcium  Date Value Ref Range Status  08/16/2019 9.2 8.4 - 10.5 mg/dL Final          Passed - Valid encounter within last 12 months    Recent Outpatient Visits           1 month ago Diarrhea, unspecified type   [place], [place], MD   4  months ago Nipple infection   [place], [place], NP   5 months ago Nipple infection   [place] Fulp, [place], MD   9 months ago Chronic fatigue   [place], MD   10 months ago Fatigue, unspecified type   [facility], [place], MD

## 2019-10-08 ENCOUNTER — Other Ambulatory Visit: Payer: Self-pay | Admitting: Otolaryngology

## 2019-10-08 DIAGNOSIS — E041 Nontoxic single thyroid nodule: Secondary | ICD-10-CM

## 2019-10-15 ENCOUNTER — Other Ambulatory Visit: Payer: Self-pay | Admitting: Otolaryngology

## 2019-10-15 DIAGNOSIS — J329 Chronic sinusitis, unspecified: Secondary | ICD-10-CM

## 2019-10-16 ENCOUNTER — Encounter: Payer: Self-pay | Admitting: Podiatry

## 2019-10-16 ENCOUNTER — Encounter (HOSPITAL_COMMUNITY): Payer: Self-pay | Admitting: Psychiatry

## 2019-10-16 ENCOUNTER — Ambulatory Visit (INDEPENDENT_AMBULATORY_CARE_PROVIDER_SITE_OTHER): Payer: Medicaid Other | Admitting: Podiatry

## 2019-10-16 ENCOUNTER — Other Ambulatory Visit: Payer: Self-pay

## 2019-10-16 ENCOUNTER — Ambulatory Visit (INDEPENDENT_AMBULATORY_CARE_PROVIDER_SITE_OTHER): Payer: Medicaid Other | Admitting: Psychiatry

## 2019-10-16 DIAGNOSIS — M79672 Pain in left foot: Secondary | ICD-10-CM | POA: Diagnosis not present

## 2019-10-16 DIAGNOSIS — L905 Scar conditions and fibrosis of skin: Secondary | ICD-10-CM

## 2019-10-16 DIAGNOSIS — F341 Dysthymic disorder: Secondary | ICD-10-CM | POA: Diagnosis not present

## 2019-10-16 NOTE — Progress Notes (Addendum)
Psychiatric Initial Adult Assessment   Patient Identification: Catherine Osborne MRN:  [id number] Date of Evaluation:  10/16/2019   Referral Source: [facility]  Chief Complaint:   " The doctor thinks I need help as I'm depressed.  I have a men problem."  Visit Diagnosis:    ICD-10-CM   1. Persistent depressive disorder with anxious distress, currently moderate  F34.1     History of Present Illness: This is a 44 year old female with history of MDD, anxiety now seen for evaluation.  Initially patient stated that her biggest problem is that she is single and has 2 children.  She stated that she is not depressed and only has issues with men however she continued to be tearful throughout the session and was noted to be quite irritable at times.   Patient reported that she has always had difficulty in controlling her anger since young age.  She stated that she used to yell and scream and throw things at people when she was younger.  She is to punch holes in the wall and destroy property.  As time progressed she is learning to control all that.  However she still does lose her control whenever she is around the men that have been in in her life.  She stated that unfortunately she has been into bad relationships.  She informed that her 56 year old son's father is completely out of their life now.  However her 36-year-old son's father is still in and out of her life.  She stated that she wants to end her relationship completely and cut all her ties with him but she is unable to do so.   She described in great detail the nature of the relationship issues with him and how he is a Librarian, academic.  She stated that she feels very frustrated in dealing with him.  She stated that the other biggest stressor in her life is that she is raising her 2 sons by herself.  Her 63-year-old son has a lot of behavioral issues and caused her stress to her lately.  She stated that she has been in therapy now and she is  hoping that with time things will get better.  She did enroll him in pre-k this year and currently is at home due to being sick.  She stated that once he returns back to school she is on start looking for a job.  She endorses feeling very irritable with some tearfulness.  She reported that she sleeps fine at night and denied any difficulties pertaining to her appetite or concentration.  She does feel let down by the choices of relationship she has made in her life.  She denies any symptom suggestive of mania or hypomania. She denies any psychotic symptoms.  Patient informed that she was prescribed sertraline by her previous PCP who is now retired.  She stated that she does take it on and off maybe like once or twice a week.  She stated that she does not really want to take it every day because it is too much work for her. Writer advised her that if she wants a medicine to be effective will be advisable she takes it on a daily basis.  She stated she does not want to try too many medications as she does not believe in pills.  She does endorse smoking cigars on a regular basis.  She also smokes weed on a regular basis.   Past Psychiatric History: MDD  Previous Psychotropic Medications: Yes  ,  Sertraline, Ambien  Substance Abuse History in the last 12 months:  Yes.    Consequences of Substance Abuse: Negative  Past Medical History:  Past Medical History:  Diagnosis Date  . Allergy   . Asthma   . Headache   . 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 Migraines   . Pneumonia   . Prior miscarriage with pregnancy in first trimester, antepartum   . Vaginal Pap smear, abnormal     Past Surgical History:  Procedure Laterality Date  . CERVICAL CERCLAGE N/A 02/26/2015   Procedure: CERCLAGE CERVICAL;  Surgeon: Frederico Hamman, MD;  Location: [facility];  Service: Gynecology;  Laterality: N/A;  . DILATION AND CURETTAGE OF UTERUS    . DILATION AND EVACUATION N/A 05/22/2012   Procedure: DILATATION AND  EVACUATION;  Surgeon: Frederico Hamman, MD;  Location: [facility];  Service: Gynecology;  Laterality: N/A;  . FOOT SURGERY      Family Psychiatric History: denied  Family History:  Family History  Adopted: Yes  Problem Relation Age of Onset  . Bone cancer Mother   . Breast cancer Maternal Grandmother   . Diabetes Other   . Hypertension Other   . Stroke Other   . Heart attack Other     Social History:   Social History   Socioeconomic History  . Marital status: Significant Other    Spouse name: Not on file  . Number of children: 2  . Years of education: Not on file  . Highest education level: Some college, no degree  Occupational History  . Not on file  Tobacco Use  . Smoking status: Former Smoker    Packs/day: 0.75    Years: 20.00    Pack years: 15.00    Types: Cigarettes    Quit date: 01/31/2018    Years since quitting: 1.7  . Smokeless tobacco: Never Used  . Tobacco comment: marijuana trying to decrease from 20 blunts day to 5 now  Vaping Use  . Vaping Use: Former  Substance and Sexual Activity  . Alcohol use: Yes    Alcohol/week: 1.0 standard drink    Types: 1 Glasses of wine per week    Comment: rare - no liquor since april 2020/ drinks wine./ update 04/22/19 drinks at special occasions    . Drug use: Yes    Frequency: 70.0 times per week    Types: Marijuana    Comment: 20 blunts a day now to 5 a day - trying to quit./ update 04/22/19- maybe 10 blunts a day  . Sexual activity: Yes    Partners: Male    Birth control/protection: None  Other Topics Concern  . Not on file  Social History Narrative   Lives top of 3 story home; lives with family; right handed; some college; little caffeine - drinks in winter; exercise none      Update 04/22/2019   Lives with her sons   Caffeine: Dr Malachi Bonds 1-2 per day   Social Determinants of Health   Financial Resource Strain:   . Difficulty of Paying Living Expenses: Not on file  Food Insecurity:   . Worried About Ship broker in the Last Year: Not on file  . Ran Out of Food in the Last Year: Not on file  Transportation Needs:   . Lack of Transportation (Medical): Not on file  . Lack of Transportation (Non-Medical): Not on file  Physical Activity:   . Days of Exercise per Week: Not on file  .  Minutes of Exercise per Session: Not on file  Stress:   . Feeling of Stress : Not on file  Social Connections:   . Frequency of Communication with Friends and Family: Not on file  . Frequency of Social Gatherings with Friends and Family: Not on file  . Attends Religious Services: Not on file  . Active Member of Clubs or Organizations: Not on file  . Attends Archivist Meetings: Not on file  . Marital Status: Not on file    Additional Social History: Lives with 2 sons- 33 and 4. Currently unemployed, plans to return back to work in the next few weeks.  Allergies:  No Known Allergies  Metabolic Disorder Labs: Lab Results  Component Value Date   HGBA1C 5.2 11/16/2018   No results found for: PROLACTIN No results found for: CHOL, TRIG, HDL, CHOLHDL, VLDL, LDLCALC Lab Results  Component Value Date   TSH 0.863 04/11/2019    Therapeutic Level Labs: No results found for: LITHIUM No results found for: CBMZ No results found for: VALPROATE  Current Medications: Current Outpatient Medications  Medication Sig Dispense Refill  . dicyclomine (BENTYL) 10 MG capsule Take 1 capsule (10 mg total) by mouth 2 (two) times daily before a meal. 20 capsule 0  . fluticasone furoate-vilanterol (BREO ELLIPTA) 200-25 MCG/INH AEPB Inhale 1 puff into the lungs daily. 28 each 0  . tinidazole (TINDAMAX) 500 MG tablet Take 2 tablets (1,000 mg total) by mouth daily with breakfast. 10 tablet 2   No current facility-administered medications for this visit.    Musculoskeletal: Strength & Muscle Tone: within normal limits Gait & Station: normal Patient leans: N/A  Psychiatric Specialty Exam: Review of Systems  There  were no vitals taken for this visit.There is no height or weight on file to calculate BMI.  General Appearance: Fairly Groomed  Eye Contact:  Good  Speech:  Clear and Coherent and Normal Rate  Volume:  Normal  Mood:  Irritable, Depressed  Affect:  Tearful, Sad, irritable  Thought Process:  Goal Directed and Descriptions of Associations: Intact  Orientation:  Full (Time, Place, and Person)  Thought Content:  Logical  Suicidal Thoughts:  No  Homicidal Thoughts:  No  Memory:  Immediate;   Good Recent;   Good  Judgement:  Fair  Insight:  Lacking  Psychomotor Activity:  Normal  Concentration:  Concentration: Good and Attention Span: Good  Recall:  Good  Fund of Knowledge:Good  Language: Good  Akathisia:  Negative  Handed:  Right  AIMS (if indicated):  0  Assets:  Communication Skills Desire for Improvement Financial Resources/Insurance Housing  ADL's:  Intact  Cognition: WNL  Sleep:  Fair   Screenings: GAD-7     Office Visit from 08/14/2019 in [facility] Office Visit from 04/11/2019 in [facility] Office Visit from 11/16/2018 in [facility]  Total GAD-7 Score 19 3 18     PHQ2-9     Office Visit from 08/14/2019 in [facility] Office Visit from 04/11/2019 in [facility] Office Visit from 11/16/2018 in [facility] US OB MFM FOLLOW UP from [facility] MFM FOLLOW UP from 03/24/2015 [facility]  PHQ-2 Total Score 2 3 3  0 0  PHQ-9 Total Score 11 9 9  -- --      Assessment and Plan:  Based on patient's history and assessment she meets criteria for persistent depressive disorder however patient is denying the need to be prescribed any medication.  She states that she has some leftover sertraline tablets from her previous provider  however she does not take them consistently and may take 1 tablet here and there.  She does not want to take anything for anxiety either.  She was offered appointment for individual therapy however she stated that she doesn't think that will help her much either.  After some hesitation she went ahead and made follow-up appointments with the writer as well as with therapist.  1. Persistent depressive disorder with anxious distress, currently moderate  Patient was advised that she should consider taking the sertraline tablets on a daily basis if she changes her mind.  Follow-up in 6 weeks.    Nevada Crane, MD 9/15/20214:22 PM

## 2019-10-20 NOTE — Progress Notes (Signed)
Subjective:  [name], female    DOB: 11/29/1975,  MRN: [id number]  44 y.o. female presents with follow up painful surgical scar plantar aspect left foot. Pain interferes with her daily activities such as caring for her son.   She relates she will start working at her former employer, [employer]. She is worried she may encounter more foot problems because she will be on her feet more.   Review of Systems: Negative except as noted in the HPI. Denies N/V/F/Ch. Past Medical History:  Diagnosis Date  . Allergy   . Asthma   . Headache   . 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 Migraines   . Pneumonia   . Prior miscarriage with pregnancy in first trimester, antepartum   . Vaginal Pap smear, abnormal    Past Surgical History:  Procedure Laterality Date  . CERVICAL CERCLAGE N/A 02/26/2015   Procedure: CERCLAGE CERVICAL;  Surgeon: Frederico Hamman, MD;  Location: [facility];  Service: Gynecology;  Laterality: N/A;  . DILATION AND CURETTAGE OF UTERUS    . DILATION AND EVACUATION N/A 05/22/2012   Procedure: DILATATION AND EVACUATION;  Surgeon: Frederico Hamman, MD;  Location: [facility];  Service: Gynecology;  Laterality: N/A;  . FOOT SURGERY     Patient Active Problem List   Diagnosis Date Noted  . Marijuana use 08/21/2019  . Anxiety 08/21/2019  . COVID-19 virus detected 03/07/2019  . Personal history of multiple sclerosis ([place]) 08/21/2018  . Persistent depressive disorder with anxious distress, currently moderate 08/21/2018  . Shortness of breath 08/21/2018  . 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 Pulmonary nodules 08/21/2018  . Substance abuse ([place]) 08/21/2018  . Cocaine abuse ([place]) 08/21/2018  . Bronchitis, acute 04/03/2018  . Seasonal allergies 01/18/2016  . Compliance poor 01/18/2016  . Asthma 12/07/2015  . Goiter diffuse 12/07/2015  . NSVD (normal spontaneous vaginal delivery) 06/13/2015  . Pregnancy induced hypertension 06/12/2015  . Preeclampsia 06/10/2015  . Short cervix  affecting pregnancy 02/25/2015  . Advanced maternal age in multigravida 02/12/2015  . Left thyroid nodule 10/25/2011    Current Outpatient Medications:  .  dicyclomine (BENTYL) 10 MG capsule, Take 1 capsule (10 mg total) by mouth 2 (two) times daily before a meal., Disp: 20 capsule, Rfl: 0 .  fluticasone furoate-vilanterol (BREO ELLIPTA) 200-25 MCG/INH AEPB, Inhale 1 puff into the lungs daily., Disp: 28 each, Rfl: 0 .  tinidazole (TINDAMAX) 500 MG tablet, Take 2 tablets (1,000 mg total) by mouth daily with breakfast., Disp: 10 tablet, Rfl: 2 No Known Allergies Social History   Tobacco Use  Smoking Status Former Smoker  . Packs/day: 0.75  . Years: 20.00  . Pack years: 15.00  . Types: Cigarettes  . Quit date: 01/31/2018  . Years since quitting: 1.7  Smokeless Tobacco Never Used  Tobacco Comment   marijuana trying to decrease from 20 blunts day to 5 now   Objective:  There were no vitals filed for this visit. Constitutional Patient is a pleasant 44 y.o. African American female, WD, WN in NAD.Marland Kitchen  Vascular Neurovascular status unchanged b/l lower extremities. Capillary refill time to digits immediate b/l. Palpable pedal pulses b/l LE. Pedal hair present. Lower extremity skin temperature gradient within normal limits. No pain with calf compression b/l. No edema noted b/l lower extremities. Capillary refill normal to all digits.  No cyanosis or clubbing noted.  Neurologic Normal speech. Oriented to person, place, and time. Protective sensation intact 5/5 intact bilaterally with 10g monofilament b/l. Vibratory  sensation intact b/l.  Dermatologic Pedal skin with normal turgor, texture and tone bilaterally. No open wounds bilaterally. No interdigital macerations bilaterally. Hyperkeratotic keloid scar noted plantarlateral aspect of left foot with tenderness to palpation. No surrounding erythema, no edema, no drainage.  Orthopedic: Normal muscle strength 5/5 to all lower extremity muscle groups  bilaterally. No pain crepitus or joint limitation noted with ROM b/l. No gross bony deformities bilaterally. Patient ambulates independent of any assistive aids.   Radiographs: None Assessment:   1. Painful cutaneous scar   2. Pain in left foot    Plan:  -Patient was evaluated and treated and all questions answered. -Educated on self-care -No new findings. No new orders. -Trimmed painful cutaneous scar to patient tolerance. Applied non-medicated horseshoe pads.  -Discussed potential treatment of fat injection under scar to reduce pain. Advised her I will speak to one of my colleagues to see if this is an option for her. -Advised continued use of nonmedicated horseshoe or aperture pads for daily protection between visits.  Return in about 9 weeks (around 12/18/2019) for painful scar.  Marzetta Board, DPM

## 2019-10-21 ENCOUNTER — Telehealth: Payer: Self-pay | Admitting: Pulmonary Disease

## 2019-10-21 NOTE — Telephone Encounter (Signed)
Called and spoke with Patient.  Patient requested a regular flu vaccine.  Patient scheduled 10/21/20, at 1000. Nothing further at this time.

## 2019-10-22 ENCOUNTER — Ambulatory Visit (INDEPENDENT_AMBULATORY_CARE_PROVIDER_SITE_OTHER): Payer: Medicaid Other

## 2019-10-22 ENCOUNTER — Other Ambulatory Visit: Payer: Self-pay

## 2019-10-22 DIAGNOSIS — Z23 Encounter for immunization: Secondary | ICD-10-CM

## 2019-10-24 ENCOUNTER — Encounter: Payer: Self-pay | Admitting: Obstetrics and Gynecology

## 2019-10-24 ENCOUNTER — Ambulatory Visit: Payer: Medicaid Other | Admitting: Obstetrics and Gynecology

## 2019-10-24 ENCOUNTER — Other Ambulatory Visit: Payer: Self-pay

## 2019-10-24 ENCOUNTER — Other Ambulatory Visit (HOSPITAL_COMMUNITY)
Admission: RE | Admit: 2019-10-24 | Discharge: 2019-10-24 | Disposition: A | Discharge status: Home / Self Care | Payer: Medicaid Other | Source: Ambulatory Visit | Attending: Obstetrics and Gynecology | Admitting: Obstetrics and Gynecology

## 2019-10-24 VITALS — BP 112/77 | HR 94 | Wt 134.0 lb

## 2019-10-24 DIAGNOSIS — N898 Other specified noninflammatory disorders of vagina: Secondary | ICD-10-CM

## 2019-10-24 NOTE — Progress Notes (Signed)
44 yo P1 here for the evaluation of vaginal discharge with odor present for the past week. Patient states she is sexually active with a new partner without condoms. Patient also reports the presence of a boil on her mons pubic present for the past 3 weeks. She states that the boil has gradually decreased in size. Patient is otherwise without any other complaints  Past Medical History:  Diagnosis Date  . Allergy   . Asthma   . Headache   . 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 Migraines   . Pneumonia   . Prior miscarriage with pregnancy in first trimester, antepartum   . Vaginal Pap smear, abnormal    Past Surgical History:  Procedure Laterality Date  . CERVICAL CERCLAGE N/A 02/26/2015   Procedure: CERCLAGE CERVICAL;  Surgeon: Frederico Hamman, MD;  Location: [facility];  Service: Gynecology;  Laterality: N/A;  . DILATION AND CURETTAGE OF UTERUS    . DILATION AND EVACUATION N/A 05/22/2012   Procedure: DILATATION AND EVACUATION;  Surgeon: Frederico Hamman, MD;  Location: [facility];  Service: Gynecology;  Laterality: N/A;  . FOOT SURGERY     Family History  Adopted: Yes  Problem Relation Age of Onset  . Bone cancer Mother   . Breast cancer Maternal Grandmother   . Diabetes Other   . Hypertension Other   . Stroke Other   . Heart attack Other    Social History   Tobacco Use  . Smoking status: Former Smoker    Packs/day: 0.75    Years: 20.00    Pack years: 15.00    Types: Cigarettes    Quit date: 01/31/2018    Years since quitting: 1.7  . Smokeless tobacco: Never Used  . Tobacco comment: marijuana trying to decrease from 20 blunts day to 5 now  Vaping Use  . Vaping Use: Former  Substance Use Topics  . Alcohol use: Yes    Alcohol/week: 1.0 standard drink    Types: 1 Glasses of wine per week    Comment: rare - no liquor since april 2020/ drinks wine./ update 04/22/19 drinks at special occasions    . Drug use: Yes    Frequency: 70.0 times per week    Types: Marijuana    Comment: 20  blunts a day now to 5 a day - trying to quit./ update 04/22/19- maybe 10 blunts a day   ROS See pertinent in HPI. All other systems reviewed and negative  Blood pressure 112/77, pulse 94, weight 134 lb (60.8 kg), last menstrual period 10/13/2019. GENERAL: Well-developed, well-nourished female in no acute distress.  ABDOMEN: Soft, nontender, nondistended. No organomegaly. PELVIC: Normal external female genitalia with a 2 cm boil on right side of mons pubis. Soft and non tender. Vagina is pink and rugated.  Normal discharge.  EXTREMITIES: No cyanosis, clubbing, or edema, 2+ distal pulses.  A/P 44 yo with vaginitis - vaginal swab collected - Patient will be contacted with abnormal results - RTC prn

## 2019-10-24 NOTE — Progress Notes (Signed)
RGYN patient presents for problem visit possible BV vaginal odor /discharge notes happening when changing new partner Also, unsure she has a bump or boil that comes and goes.

## 2019-10-25 ENCOUNTER — Ambulatory Visit
Admission: RE | Admit: 2019-10-25 | Discharge: 2019-10-25 | Disposition: A | Discharge status: Home / Self Care | Payer: Medicaid Other | Source: Ambulatory Visit | Attending: Otolaryngology | Admitting: Otolaryngology

## 2019-10-25 DIAGNOSIS — J329 Chronic sinusitis, unspecified: Secondary | ICD-10-CM

## 2019-10-25 LAB — CERVICOVAGINAL ANCILLARY ONLY
Bacterial Vaginitis (gardnerella): POSITIVE — AB
Candida Glabrata: NEGATIVE
Candida Vaginitis: NEGATIVE
Chlamydia: NEGATIVE
Comment: NEGATIVE
Comment: NEGATIVE
Comment: NEGATIVE
Comment: NEGATIVE
Comment: NEGATIVE
Comment: NORMAL
Neisseria Gonorrhea: NEGATIVE
Trichomonas: NEGATIVE

## 2019-10-28 MED ORDER — METRONIDAZOLE 500 MG PO TABS
500.0000 mg | ORAL_TABLET | Freq: Two times a day (BID) | ORAL | 0 refills | Status: DC
Start: 2019-10-28 — End: 2019-12-02

## 2019-10-28 NOTE — Addendum Note (Signed)
Addended by: Mora Bellman on: 10/28/2019 10:16 AM   Modules accepted: Orders

## 2019-11-08 ENCOUNTER — Ambulatory Visit: Payer: Medicaid Other | Admitting: Podiatry

## 2019-11-14 ENCOUNTER — Inpatient Hospital Stay: Admission: RE | Admit: 2019-11-14 | Payer: Medicaid Other | Source: Ambulatory Visit

## 2019-11-21 ENCOUNTER — Other Ambulatory Visit (HOSPITAL_COMMUNITY)
Admission: RE | Admit: 2019-11-21 | Discharge: 2019-11-21 | Disposition: A | Discharge status: Home / Self Care | Payer: Medicaid Other | Source: Ambulatory Visit | Attending: Physician Assistant | Admitting: Physician Assistant

## 2019-11-21 ENCOUNTER — Ambulatory Visit
Admission: RE | Admit: 2019-11-21 | Discharge: 2019-11-21 | Disposition: A | Discharge status: Home / Self Care | Payer: Medicaid Other | Source: Ambulatory Visit | Attending: Otolaryngology | Admitting: Otolaryngology

## 2019-11-21 DIAGNOSIS — D440 Neoplasm of uncertain behavior of thyroid gland: Secondary | ICD-10-CM | POA: Insufficient documentation

## 2019-11-21 DIAGNOSIS — E041 Nontoxic single thyroid nodule: Secondary | ICD-10-CM

## 2019-11-21 NOTE — Procedures (Addendum)
PROCEDURE SUMMARY:  Using direct ultrasound guidance, 5 passes were made using 25 g needles into the nodule within the left lobe of the thyroid.   Ultrasound was used to confirm needle placements on all occasions.   EBL = trace  Procedure was technically difficult to perform secondary to patient anxiety, motion, swallowing, and coughing.   Recommend any future thyroid biopsies should be performed at the hospital with conscious sedation.  Specimens were sent to Pathology for analysis.  See procedure note under Imaging tab in Epic for full procedure details.  Stehanie Ekstrom S Pattye Meda PA-C 11/21/2019 9:48 AM

## 2019-11-22 LAB — CYTOLOGY - NON PAP

## 2019-11-28 ENCOUNTER — Other Ambulatory Visit (HOSPITAL_COMMUNITY)
Admission: RE | Admit: 2019-11-28 | Discharge: 2019-11-28 | Disposition: A | Discharge status: Home / Self Care | Payer: Medicaid Other | Source: Ambulatory Visit | Attending: Obstetrics and Gynecology | Admitting: Obstetrics and Gynecology

## 2019-11-28 ENCOUNTER — Encounter (HOSPITAL_COMMUNITY): Payer: Self-pay | Admitting: Psychiatry

## 2019-11-28 ENCOUNTER — Encounter: Payer: Self-pay | Admitting: Obstetrics and Gynecology

## 2019-11-28 ENCOUNTER — Ambulatory Visit (INDEPENDENT_AMBULATORY_CARE_PROVIDER_SITE_OTHER): Payer: Medicaid Other | Admitting: Psychiatry

## 2019-11-28 ENCOUNTER — Ambulatory Visit: Payer: Medicaid Other | Admitting: Obstetrics and Gynecology

## 2019-11-28 ENCOUNTER — Other Ambulatory Visit: Payer: Self-pay

## 2019-11-28 VITALS — BP 118/87 | HR 77 | Ht 62.0 in | Wt 133.0 lb

## 2019-11-28 VITALS — BP 109/74 | HR 74 | Wt 131.3 lb

## 2019-11-28 DIAGNOSIS — R102 Pelvic and perineal pain: Secondary | ICD-10-CM

## 2019-11-28 DIAGNOSIS — N898 Other specified noninflammatory disorders of vagina: Secondary | ICD-10-CM | POA: Insufficient documentation

## 2019-11-28 DIAGNOSIS — F341 Dysthymic disorder: Secondary | ICD-10-CM

## 2019-11-28 DIAGNOSIS — R300 Dysuria: Secondary | ICD-10-CM | POA: Diagnosis not present

## 2019-11-28 LAB — POCT URINALYSIS DIPSTICK
Bilirubin, UA: NEGATIVE
Glucose, UA: NEGATIVE
Ketones, UA: NEGATIVE
Leukocytes, UA: NEGATIVE
Nitrite, UA: NEGATIVE
Protein, UA: NEGATIVE
Spec Grav, UA: 1.02 (ref 1.010–1.025)
Urobilinogen, UA: 0.2 E.U./dL
pH, UA: 6 (ref 5.0–8.0)

## 2019-11-28 MED ORDER — SULFAMETHOXAZOLE-TRIMETHOPRIM 800-160 MG PO TABS: 1.0000 | ORAL_TABLET | Freq: Two times a day (BID) | ORAL | 0 refills | Status: AC

## 2019-11-28 MED ORDER — FLUCONAZOLE 150 MG PO TABS: 150.0000 mg | ORAL_TABLET | Freq: Once | ORAL | 0 refills | Status: AC

## 2019-11-28 NOTE — Progress Notes (Signed)
RGYN patient presents for problem visit today. Last seen in office 10/24/19 for vaginal discharge and odor and boil.   CC: Vaginal discharge and burning /pelvic pain. No dysuria.   LMP : 11/08/2019  Pt wants STD screening.   Last Pap: 08/15/19: WNL

## 2019-11-28 NOTE — Progress Notes (Signed)
[place] MD/PA/NP OP Progress Note  11/28/2019 11:33 AM Catherine Osborne  MRN:  [id number]  Chief Complaint: " I broke up with him."  HPI: Catherine Osborne is a 44 y.o. female w/ PMHx of PDD w/ anxious distress who presents for a follow up visit. She reports today she is feeling down and that she just ended her relationship with her boyfriend who was living with her. States that she is sad that she has to start all over again with the single life and is not looking forward to it. States that she needs to be in a relationship because "we did not get put on this Earth to be alone". States that "men are all dogs" and patient showed low hope for her future relationship status given that she is getting close to being 44. States that her ex-boyfriend is completely out of the picture and does not talk to her sons. Reports that she did pick up a call from him a couple of days ago for "something bad that happened to him" and he needed to talk to someone. We discussed better judgement going forward as she looks for a new partner. Patient acknowledges that she has not made the wisest decisions in the past since she ends up getting pregnant with her past boyfriends before they get married. Patient also notices that the change has to happen within and that she knows it is on her at the end of the day.  Patient states that she does not take zoloft regularly.  She was open to trying it more consistently.  Visit Diagnosis:    ICD-10-CM   1. Persistent depressive disorder with anxious distress, currently moderate  F34.1     Past Psychiatric History: MDD  Past Medical History:  Past Medical History:  Diagnosis Date  . Allergy   . Asthma   . Headache   . 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 Migraines   . Pneumonia   . Prior miscarriage with pregnancy in first trimester, antepartum   . Vaginal Pap smear, abnormal     Past Surgical History:  Procedure Laterality Date  . CERVICAL CERCLAGE N/A 02/26/2015   Procedure:  CERCLAGE CERVICAL;  Surgeon: Frederico Hamman, MD;  Location: [facility];  Service: Gynecology;  Laterality: N/A;  . DILATION AND CURETTAGE OF UTERUS    . DILATION AND EVACUATION N/A 05/22/2012   Procedure: DILATATION AND EVACUATION;  Surgeon: Frederico Hamman, MD;  Location: [facility];  Service: Gynecology;  Laterality: N/A;  . FOOT SURGERY      Family Psychiatric History: denied  Family History:  Family History  Adopted: Yes  Problem Relation Age of Onset  . Bone cancer Mother   . Breast cancer Maternal Grandmother   . Diabetes Other   . Hypertension Other   . Stroke Other   . Heart attack Other     Social History:  Social History   Socioeconomic History  . Marital status: Significant Other    Spouse name: Not on file  . Number of children: 2  . Years of education: Not on file  . Highest education level: Some college, no degree  Occupational History  . Not on file  Tobacco Use  . Smoking status: Former Smoker    Packs/day: 0.75    Years: 20.00    Pack years: 15.00    Types: Cigarettes    Quit date: 01/31/2018    Years since quitting: 1.8  . Smokeless tobacco: Never Used  . Tobacco  comment: marijuana trying to decrease from 20 blunts day to 5 now  Vaping Use  . Vaping Use: Former  Substance and Sexual Activity  . Alcohol use: Yes    Alcohol/week: 1.0 standard drink    Types: 1 Glasses of wine per week    Comment: rare - no liquor since april 2020/ drinks wine./ update 04/22/19 drinks at special occasions    . Drug use: Yes    Frequency: 70.0 times per week    Types: Marijuana    Comment: 20 blunts a day now to 5 a day - trying to quit./ update 04/22/19- maybe 10 blunts a day  . Sexual activity: Yes    Partners: Male    Birth control/protection: None  Other Topics Concern  . Not on file  Social History Narrative   Lives top of 3 story home; lives with family; right handed; some college; little caffeine - drinks in winter; exercise none      Update 04/22/2019    Lives with her sons   Caffeine: Dr Malachi Bonds 1-2 per day   Social Determinants of Health   Financial Resource Strain:   . Difficulty of Paying Living Expenses: Not on file  Food Insecurity:   . Worried About Charity fundraiser in the Last Year: Not on file  . Ran Out of Food in the Last Year: Not on file  Transportation Needs:   . Lack of Transportation (Medical): Not on file  . Lack of Transportation (Non-Medical): Not on file  Physical Activity:   . Days of Exercise per Week: Not on file  . Minutes of Exercise per Session: Not on file  Stress:   . Feeling of Stress : Not on file  Social Connections:   . Frequency of Communication with Friends and Family: Not on file  . Frequency of Social Gatherings with Friends and Family: Not on file  . Attends Religious Services: Not on file  . Active Member of Clubs or Organizations: Not on file  . Attends Archivist Meetings: Not on file  . Marital Status: Not on file    Allergies: No Known Allergies  Metabolic Disorder Labs: Lab Results  Component Value Date   HGBA1C 5.2 11/16/2018   No results found for: PROLACTIN No results found for: CHOL, TRIG, HDL, CHOLHDL, VLDL, LDLCALC Lab Results  Component Value Date   TSH 0.863 04/11/2019   TSH 1.490 11/16/2018    Therapeutic Level Labs: No results found for: LITHIUM No results found for: VALPROATE No components found for:  CBMZ  Current Medications: Current Outpatient Medications  Medication Sig Dispense Refill  . dicyclomine (BENTYL) 10 MG capsule Take 1 capsule (10 mg total) by mouth 2 (two) times daily before a meal. 20 capsule 0  . fluconazole (DIFLUCAN) 150 MG tablet Take 1 tablet (150 mg total) by mouth once for 1 dose. Can take additional dose three days later if symptoms persist 1 tablet 0  . fluticasone furoate-vilanterol (BREO ELLIPTA) 200-25 MCG/INH AEPB Inhale 1 puff into the lungs daily. 28 each 0  . metroNIDAZOLE (FLAGYL) 500 MG tablet Take 1 tablet (500 mg  total) by mouth 2 (two) times daily. 14 tablet 0  . sulfamethoxazole-trimethoprim (BACTRIM DS) 800-160 MG tablet Take 1 tablet by mouth 2 (two) times daily for 5 days. 10 tablet 0  . tinidazole (TINDAMAX) 500 MG tablet Take 2 tablets (1,000 mg total) by mouth daily with breakfast. 10 tablet 2   No current facility-administered medications for this  visit.     Musculoskeletal: Strength & Muscle Tone: within normal limits Gait & Station: normal Patient leans: N/A  Psychiatric Specialty Exam: Review of Systems  Blood pressure 118/87, pulse 77, height 5\' 2"  (1.575 m), weight 133 lb (60.3 kg), last menstrual period 11/08/2019, SpO2 100 %.Body mass index is 24.33 kg/m.  General Appearance: Fairly Groomed  Eye Contact:  Good  Speech:  Clear and Coherent and Normal Rate  Volume:  Normal  Mood:  Depressed  Affect:  Congruent  Thought Process:  Goal Directed and Descriptions of Associations: Intact  Orientation:  Full (Time, Place, and Person)  Thought Content: Logical   Suicidal Thoughts:  No  Homicidal Thoughts:  No  Memory:  Immediate;   Good Recent;   Good  Judgement:  Fair  Insight:  Fair  Psychomotor Activity:  Normal  Concentration:  Concentration: Good and Attention Span: Good  Recall:  Good  Fund of Knowledge: Good  Language: Good  Akathisia:  Negative  Handed:  Right  AIMS (if indicated): 0  Assets:  Communication Skills Desire for Improvement Financial Resources/Insurance Housing  ADL's:  Intact  Cognition: WNL  Sleep:  Fair   Screenings: GAD-7     Office Visit from 08/14/2019 in [facility] Office Visit from 04/11/2019 in [facility] Office Visit from 11/16/2018 in [facility]  Total GAD-7 Score 19 3 18     PHQ2-9     Office Visit from 08/14/2019 in [facility] Office Visit from 04/11/2019 in [facility] Office Visit  from 11/16/2018 in [facility] US OB MFM FOLLOW UP from [facility] MFM FOLLOW UP from 03[facility]24/2015 [facility]  PHQ-2 Total Score 2 3 3  0 0  PHQ-9 Total Score 11 9 9  -- --       Assessment and Plan: Patient presented today for follow up and this new stressor of her recent relationship status change. She was advised her to take zoloft more consistently to improve mood but that ultimately the change needs to happen within herself before entering new relationships.   We will follow up in 2 months with patient.    Nevada Crane, MD 11/28/2019, 11:33 AM

## 2019-11-28 NOTE — Progress Notes (Signed)
   Subjective:    [name], female    DOB: [birth year], 44 y.o.   MRN: [id number]  HPI  44 yo G14P2 seen at [facility] with 1 week history of vaginal discharge with a thick cheesy white quality.  She notes dysuria as well, but no frequency of urination.  The patient also notes mild nausea.  Last vaginal swab showed bacterial vaginosis.  Gonorrhea and chlamydia was negative.    Review of Systems  Constitutional: Negative.   HENT: Negative.   Eyes: Negative.   Respiratory: Negative.   Gastrointestinal: Negative for abdominal pain.  Endocrine: Negative.   Genitourinary: Positive for dysuria and vaginal discharge. Negative for frequency.  Musculoskeletal: Negative.   Neurological: Negative.   Psychiatric/Behavioral: Negative.        Objective:   Physical Exam Constitutional:      Appearance: Normal appearance. She is normal weight.  HENT:     Head: Normocephalic and atraumatic.  Cardiovascular:     Rate and Rhythm: Normal rate and regular rhythm.     Heart sounds: Normal heart sounds.  Pulmonary:     Effort: Pulmonary effort is normal.     Breath sounds: Normal breath sounds.  Genitourinary:    Comments: SVE:  Minimal amount of discharge , thin white, no odor Skin:    General: Skin is warm and dry.  Neurological:     General: No focal deficit present.     Mental Status: She is alert.  Psychiatric:        Mood and Affect: Mood normal.        Behavior: Behavior normal.     Vitals:   11/28/19 0914  BP: 109/74  Pulse: 74       Assessment & Plan:   1. Pelvic pain  - Urine Culture - POCT Urinalysis Dipstick  2. Vaginal discharge Vaginal swab taken, no obvious sign of infection, results are pending - Cervicovaginal ancillary only( [facility]) - fluconazole (DIFLUCAN) 150 MG tablet; Take 1 tablet (150 mg total) by mouth once for 1 dose. Can take additional dose three days later if symptoms persist  Dispense: 1 tablet; Refill: 0  3.  Dysuria Will empirically treat for uti, per pt request rx for diflucan to prevent yeast infection after antibiotics. - Urine Culture - sulfamethoxazole-trimethoprim (BACTRIM DS) 800-160 MG tablet; Take 1 tablet by mouth 2 (two) times daily for 5 days.  Dispense: 10 tablet; Refill: 0   F/u prn  Lynnda Shields, MD Faculty Attending Center for [facility]

## 2019-11-28 NOTE — Patient Instructions (Signed)
Vaginitis  Vaginitis is irritation and swelling (inflammation) of the vagina. It happens when normal bacteria and yeast in the vagina grow too much. There are many types of this condition. Treatment will depend on the type you have. Follow these instructions at home: Lifestyle  Keep your vagina area clean and dry. ? Avoid using soap. ? Rinse the area with water.  Do not do the following until your doctor says it is okay: ? Wash and clean out the vagina (douche). ? Use tampons. ? Have sex.  Wipe from front to back after going to the bathroom.  Let air reach your vagina. ? Wear cotton underwear. ? Do not wear:  Underwear while you sleep.  Tight pants.  Thong underwear.  Underwear or nylons without a cotton panel. ? Take off any wet clothing, such as bathing suits, as soon as possible.  Use gentle, non-scented products. Do not use things that can irritate the vagina, such as fabric softeners. Avoid the following products if they are scented: ? Feminine sprays. ? Detergents. ? Tampons. ? Feminine hygiene products. ? Soaps or bubble baths.  Practice safe sex and use condoms. General instructions  Take over-the-counter and prescription medicines only as told by your doctor.  If you were prescribed an antibiotic medicine, take or use it as told by your doctor. Do not stop taking or using the antibiotic even if you start to feel better.  Keep all follow-up visits as told by your doctor. This is important. Contact a doctor if:  You have pain in your belly.  You have a fever.  Your symptoms last for more than 2-3 days. Get help right away if:  You have a fever and your symptoms get worse all of a sudden. Summary  Vaginitis is irritation and swelling of the vagina. It can happen when the normal bacteria and yeast in the vagina grow too much. There are many types.  Treatment will depend on the type you have.  Do not douche, use tampons , or have sex until your health  care provider approves. When you can return to sex, practice safe sex and use condoms. This information is not intended to replace advice given to you by your health care provider. Make sure you discuss any questions you have with your health care provider. Document Revised: 12/30/2016 Document Reviewed: 02/09/2016 Elsevier Patient Education  [place]. Urinary Tract Infection, Adult A urinary tract infection (UTI) is an infection of any part of the urinary tract. The urinary tract includes:  The kidneys.  The ureters.  The bladder.  The urethra. These organs make, store, and get rid of pee (urine) in the body. What are the causes? This is caused by germs (bacteria) in your genital area. These germs grow and cause swelling (inflammation) of your urinary tract. What increases the risk? You are more likely to develop this condition if:  You have a small, thin tube (catheter) to drain pee.  You cannot control when you pee or poop (incontinence).  You are female, and: ? You use these methods to prevent pregnancy:  A medicine that kills sperm (spermicide).  A device that blocks sperm (diaphragm). ? You have low levels of a female hormone (estrogen). ? You are pregnant.  You have genes that add to your risk.  You are sexually active.  You take antibiotic medicines.  You have trouble peeing because of: ? A prostate that is bigger than normal, if you are female. ? A blockage in the  part of your body that drains pee from the bladder (urethra). ? A kidney stone. ? A nerve condition that affects your bladder (neurogenic bladder). ? Not getting enough to drink. ? Not peeing often enough.  You have other conditions, such as: ? Diabetes. ? A weak disease-fighting system (immune system). ? Sickle cell disease. ? Gout. ? Injury of the spine. What are the signs or symptoms? Symptoms of this condition include:  Needing to pee right away (urgently).  Peeing  often.  Peeing small amounts often.  Pain or burning when peeing.  Blood in the pee.  Pee that smells bad or not like normal.  Trouble peeing.  Pee that is cloudy.  Fluid coming from the vagina, if you are female.  Pain in the belly or lower back. Other symptoms include:  Throwing up (vomiting).  No urge to eat.  Feeling mixed up (confused).  Being tired and grouchy (irritable).  A fever.  Watery poop (diarrhea). How is this treated? This condition may be treated with:  Antibiotic medicine.  Other medicines.  Drinking enough water. Follow these instructions at home:  Medicines  Take over-the-counter and prescription medicines only as told by your doctor.  If you were prescribed an antibiotic medicine, take it as told by your doctor. Do not stop taking it even if you start to feel better. General instructions  Make sure you: ? Pee until your bladder is empty. ? Do not hold pee for a long time. ? Empty your bladder after sex. ? Wipe from front to back after pooping if you are a female. Use each tissue one time when you wipe.  Drink enough fluid to keep your pee pale yellow.  Keep all follow-up visits as told by your doctor. This is important. Contact a doctor if:  You do not get better after 1-2 days.  Your symptoms go away and then come back. Get help right away if:  You have very bad back pain.  You have very bad pain in your lower belly.  You have a fever.  You are sick to your stomach (nauseous).  You are throwing up. Summary  A urinary tract infection (UTI) is an infection of any part of the urinary tract.  This condition is caused by germs in your genital area.  There are many risk factors for a UTI. These include having a small, thin tube to drain pee and not being able to control when you pee or poop.  Treatment includes antibiotic medicines for germs.  Drink enough fluid to keep your pee pale yellow. This information is not  intended to replace advice given to you by your health care provider. Make sure you discuss any questions you have with your health care provider. Document Revised: 01/04/2018 Document Reviewed: 07/27/2017 Elsevier Patient Education  2020 [employer].

## 2019-11-29 LAB — CERVICOVAGINAL ANCILLARY ONLY
Bacterial Vaginitis (gardnerella): POSITIVE — AB
Candida Glabrata: NEGATIVE
Candida Vaginitis: POSITIVE — AB
Chlamydia: NEGATIVE
Comment: NEGATIVE
Comment: NEGATIVE
Comment: NEGATIVE
Comment: NEGATIVE
Comment: NEGATIVE
Comment: NORMAL
Neisseria Gonorrhea: NEGATIVE
Trichomonas: NEGATIVE

## 2019-11-29 LAB — URINALYSIS
Bilirubin, UA: NEGATIVE
Glucose, UA: NEGATIVE
Ketones, UA: NEGATIVE
Leukocytes,UA: NEGATIVE
Nitrite, UA: NEGATIVE
Protein,UA: NEGATIVE
RBC, UA: NEGATIVE
Specific Gravity, UA: 1.022 (ref 1.005–1.030)
Urobilinogen, Ur: 0.2 mg/dL (ref 0.2–1.0)
pH, UA: 6 (ref 5.0–7.5)

## 2019-11-30 LAB — URINE CULTURE

## 2019-12-02 ENCOUNTER — Other Ambulatory Visit: Payer: Self-pay

## 2019-12-02 DIAGNOSIS — N898 Other specified noninflammatory disorders of vagina: Secondary | ICD-10-CM

## 2019-12-02 MED ORDER — FLUCONAZOLE 150 MG PO TABS: 150.0000 mg | ORAL_TABLET | Freq: Once | ORAL | 0 refills | Status: AC

## 2019-12-02 MED ORDER — METRONIDAZOLE 500 MG PO TABS: 500.0000 mg | ORAL_TABLET | Freq: Two times a day (BID) | ORAL | 0 refills | Status: DC

## 2019-12-02 NOTE — Progress Notes (Signed)
Treament Rx sent as advised by provider Mychart message sent to pt.

## 2019-12-06 ENCOUNTER — Encounter (HOSPITAL_COMMUNITY): Payer: Self-pay | Admitting: *Deleted

## 2019-12-06 ENCOUNTER — Ambulatory Visit (HOSPITAL_COMMUNITY)
Admission: EM | Admit: 2019-12-06 | Discharge: 2019-12-06 | Disposition: A | Discharge status: Home / Self Care | Payer: Medicaid Other | Attending: Family Medicine | Admitting: Family Medicine

## 2019-12-06 ENCOUNTER — Other Ambulatory Visit: Payer: Self-pay

## 2019-12-06 DIAGNOSIS — S61210A Laceration without foreign body of right index finger without damage to nail, initial encounter: Secondary | ICD-10-CM

## 2019-12-06 DIAGNOSIS — Z23 Encounter for immunization: Secondary | ICD-10-CM

## 2019-12-06 MED ORDER — TETANUS-DIPHTH-ACELL PERTUSSIS 5-2.5-18.5 LF-MCG/0.5 IM SUSY
PREFILLED_SYRINGE | INTRAMUSCULAR | Status: AC
  Filled 2019-12-06: qty 0.5

## 2019-12-06 MED ORDER — KETOROLAC TROMETHAMINE 30 MG/ML IJ SOLN
30.0000 mg | Freq: Once | INTRAMUSCULAR | Status: AC
  Administered 2019-12-06: 30 mg via INTRAMUSCULAR

## 2019-12-06 MED ORDER — KETOROLAC TROMETHAMINE 30 MG/ML IJ SOLN
INTRAMUSCULAR | Status: AC
  Filled 2019-12-06: qty 1

## 2019-12-06 MED ORDER — TETANUS-DIPHTH-ACELL PERTUSSIS 5-2.5-18.5 LF-MCG/0.5 IM SUSY
0.5000 mL | PREFILLED_SYRINGE | Freq: Once | INTRAMUSCULAR | Status: AC
  Administered 2019-12-06: 0.5 mL via INTRAMUSCULAR

## 2019-12-06 NOTE — ED Triage Notes (Signed)
Pt reports cutting her RT index finger. Bleeding controlled on arrival to room

## 2019-12-06 NOTE — Discharge Instructions (Addendum)
We placed Dermabond on the wound.  Tetanus updated today. Keep is dry as possible.  After 24 hours you can get wet do not submerge in water. Let the glue fall off on its own

## 2019-12-06 NOTE — ED Provider Notes (Signed)
[facility]    CSN: [id number] Arrival date & time: 12/06/19  1254      History   Chief Complaint Chief Complaint  Patient presents with  . Laceration    HPI Catherine Osborne is a 44 y.o. female.   Patient is a 44 year old female presents today with laceration to the tip of the right index finger.  This occurred prior to arrival at work.  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  Bleeding is controlled.  She is unsure of last tetanus.       Past Medical History:  Diagnosis Date  . Allergy   . Asthma   . Headache   . 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 Migraines   . Pneumonia   . Prior miscarriage with pregnancy in first trimester, antepartum   . Vaginal Pap smear, abnormal     Patient Active Problem List   Diagnosis Date Noted  . Dysuria 11/28/2019  . Vaginal discharge 11/28/2019  . Marijuana use 08/21/2019  . Anxiety 08/21/2019  . COVID-19 virus detected 03/07/2019  . Personal history of multiple sclerosis ([place]) 08/21/2018  . Persistent depressive disorder with anxious distress, currently moderate 08/21/2018  . Shortness of breath 08/21/2018  . 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 Pulmonary nodules 08/21/2018  . Substance abuse ([place]) 08/21/2018  . Cocaine abuse ([place]) 08/21/2018  . Bronchitis, acute 04/03/2018  . Seasonal allergies 01/18/2016  . Compliance poor 01/18/2016  . Asthma 12/07/2015  . Goiter diffuse 12/07/2015  . NSVD (normal spontaneous vaginal delivery) 06/13/2015  . Pregnancy induced hypertension 06/12/2015  . Preeclampsia 06/10/2015  . Short cervix affecting pregnancy 02/25/2015  . Advanced maternal age in multigravida 02/12/2015  . Left thyroid nodule 10/25/2011    Past Surgical History:  Procedure Laterality Date  . CERVICAL CERCLAGE N/A 02/26/2015   Procedure: CERCLAGE CERVICAL;  Surgeon: Frederico Hamman, MD;  Location: [facility];  Service: Gynecology;  Laterality: N/A;  . DILATION AND CURETTAGE OF UTERUS    . DILATION AND  EVACUATION N/A 05/22/2012   Procedure: DILATATION AND EVACUATION;  Surgeon: Frederico Hamman, MD;  Location: [facility];  Service: Gynecology;  Laterality: N/A;  . FOOT SURGERY      OB History    Gravida  14   Para  2   Term  1   Preterm  1   AB  12   Living  1     SAB  10   TAB  2   Ectopic      Multiple  0   Live Births  1            Home Medications    Prior to Admission medications   Medication Sig Start Date End Date Taking? Authorizing Provider  fluticasone furoate-vilanterol (BREO ELLIPTA) 200-25 MCG/INH AEPB Inhale 1 puff into the lungs daily. 08/29/19  Yes Martyn Ehrich, NP  metroNIDAZOLE (FLAGYL) 500 MG tablet Take 1 tablet (500 mg total) by mouth 2 (two) times daily. 12/02/19  Yes Griffin Basil, MD  dicyclomine (BENTYL) 10 MG capsule Take 1 capsule (10 mg total) by mouth 2 (two) times daily before a meal. 08/16/19   Noralyn Pick, NP  tinidazole (TINDAMAX) 500 MG tablet Take 2 tablets (1,000 mg total) by mouth daily with breakfast. 08/17/19   Shelly Bombard, MD  nortriptyline (PAMELOR) 10 MG capsule Take 1 capsule (10 mg total) by mouth at bedtime. Patient not taking: Reported on 05/27/2019 04/22/19 07/14/19  Melvenia Beam, MD    Family History Family History  Adopted: Yes  Problem Relation Age of Onset  . Bone cancer Mother   . Breast cancer Maternal Grandmother   . Diabetes Other   . Hypertension Other   . Stroke Other   . Heart attack Other     Social History Social History   Tobacco Use  . Smoking status: Former Smoker    Packs/day: 0.75    Years: 20.00    Pack years: 15.00    Types: Cigarettes    Quit date: 01/31/2018    Years since quitting: 1.8  . Smokeless tobacco: Never Used  . Tobacco comment: marijuana trying to decrease from 20 blunts day to 5 now  Vaping Use  . Vaping Use: Former  Substance Use Topics  . Alcohol use: Yes    Alcohol/week: 1.0 standard drink    Types: 1 Glasses of wine per week     Comment: rare - no liquor since april 2020/ drinks wine./ update 04/22/19 drinks at special occasions    . Drug use: Yes    Frequency: 70.0 times per week    Types: Marijuana    Comment: 20 blunts a day now to 5 a day - trying to quit./ update 04/22/19- maybe 10 blunts a day     Allergies   Patient has no known allergies.   Review of Systems Review of Systems   Physical Exam Triage Vital Signs ED Triage Vitals  Enc Vitals Group     BP 12/06/19 1452 122/79     Pulse Rate 12/06/19 1452 71     Resp 12/06/19 1459 16     Temp 12/06/19 1452 98 F (36.7 C)     Temp Source 12/06/19 1452 Oral     SpO2 12/06/19 1452 100 %     Weight 12/06/19 1455 130 lb (59 kg)     Height 12/06/19 1455 5\' 2"  (1.575 m)     Head Circumference --      Peak Flow --      Pain Score 12/06/19 1454 6     Pain Loc --      Pain Edu? --      Excl. in [place]? --    No data found.  Updated Vital Signs BP 122/79 (BP Location: Right Arm)   Pulse 71   Temp 98 F (36.7 C) (Oral)   Resp 16   Ht 5\' 2"  (1.575 m)   Wt 130 lb (59 kg)   LMP 11/08/2019 (Exact Date)   SpO2 100%   BMI 23.78 kg/m   Visual Acuity Right Eye Distance:   Left Eye Distance:   Bilateral Distance:    Right Eye Near:   Left Eye Near:    Bilateral Near:     Physical Exam Vitals and nursing note reviewed.  Constitutional:      General: She is not in acute distress.    Appearance: Normal appearance. She is not ill-appearing, toxic-appearing or diaphoretic.  HENT:     Head: Normocephalic.     Nose: Nose normal.  Eyes:     Conjunctiva/sclera: Conjunctivae normal.  Pulmonary:     Effort: Pulmonary effort is normal.  Musculoskeletal:        General: Normal range of motion.     Cervical back: Normal range of motion.  Skin:    General: Skin is warm and dry.     Findings: No rash.     Comments: Approximated half centimeter laceration  to tip of right index finger  Neurological:     Mental Status: She is alert.  Psychiatric:         Mood and Affect: Mood normal.      UC Treatments / Results  Labs (all labs ordered are listed, but only abnormal results are displayed) Labs Reviewed - No data to display  EKG   Radiology No results found.  Procedures Procedures (including critical care time)  Medications Ordered in UC Medications  Tdap (BOOSTRIX) injection 0.5 mL (has no administration in time range)  ketorolac (TORADOL) 30 MG/ML injection 30 mg (has no administration in time range)    Initial Impression / Assessment and Plan / UC Course  I have reviewed the triage vital signs and the nursing notes.  Pertinent labs & imaging results that were available during my care of the patient were reviewed by me and considered in my medical decision making (see chart for details).     Laceration Cleaned and closed with glue No nail involvement.  Tetanus updated.   Final Clinical Impressions(s) / UC Diagnoses   Final diagnoses:  Laceration of right index finger without foreign body without damage to nail, initial encounter     Discharge Instructions     We placed Dermabond on the wound.  Tetanus updated today. Keep is dry as possible.  After 24 hours you can get wet do not submerge in water. Let the glue fall off on its own    ED Prescriptions    None     PDMP not reviewed this encounter.   Orvan July, NP 12/06/19 1551

## 2019-12-19 ENCOUNTER — Ambulatory Visit (HOSPITAL_COMMUNITY): Payer: Medicaid Other | Admitting: Licensed Clinical Social Worker

## 2019-12-23 ENCOUNTER — Ambulatory Visit: Payer: Medicaid Other | Admitting: Obstetrics

## 2019-12-23 ENCOUNTER — Other Ambulatory Visit (HOSPITAL_COMMUNITY)
Admission: RE | Admit: 2019-12-23 | Discharge: 2019-12-23 | Disposition: A | Discharge status: Home / Self Care | Payer: Medicaid Other | Source: Ambulatory Visit | Attending: Obstetrics | Admitting: Obstetrics

## 2019-12-23 ENCOUNTER — Encounter: Payer: Self-pay | Admitting: Obstetrics

## 2019-12-23 ENCOUNTER — Other Ambulatory Visit: Payer: Self-pay

## 2019-12-23 VITALS — BP 131/92 | HR 91 | Wt 121.0 lb

## 2019-12-23 DIAGNOSIS — N898 Other specified noninflammatory disorders of vagina: Secondary | ICD-10-CM

## 2019-12-23 DIAGNOSIS — Z113 Encounter for screening for infections with a predominantly sexual mode of transmission: Secondary | ICD-10-CM

## 2019-12-23 NOTE — Progress Notes (Signed)
Pt presents for vaginal discharge denies odor and itching. Pt also requests all STD testing Normal pap 08/15/19

## 2019-12-23 NOTE — Progress Notes (Signed)
[name], female   DOB: November 30, 1975, 44 y.o.   MRN: [id number]  Chief Complaint  Patient presents with  . Vaginal Discharge    HPI Catherine Osborne is a 44 y.o. female.  Vaginal discharge.  Denies vaginal odor or irritation.  She was treated for BV ~ 3 weeks ago. HPI  Past Medical History:  Diagnosis Date  . Allergy   . Asthma   . Headache   . 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 Migraines   . Pneumonia   . Prior miscarriage with pregnancy in first trimester, antepartum   . Vaginal Pap smear, abnormal     Past Surgical History:  Procedure Laterality Date  . CERVICAL CERCLAGE N/A 02/26/2015   Procedure: CERCLAGE CERVICAL;  Surgeon: Frederico Hamman, MD;  Location: [facility];  Service: Gynecology;  Laterality: N/A;  . DILATION AND CURETTAGE OF UTERUS    . DILATION AND EVACUATION N/A 05/22/2012   Procedure: DILATATION AND EVACUATION;  Surgeon: Frederico Hamman, MD;  Location: [facility];  Service: Gynecology;  Laterality: N/A;  . FOOT SURGERY      Family History  Adopted: Yes  Problem Relation Age of Onset  . Bone cancer Mother   . Breast cancer Maternal Grandmother   . Diabetes Other   . Hypertension Other   . Stroke Other   . Heart attack Other     Social History Social History   Tobacco Use  . Smoking status: Former Smoker    Packs/day: 0.75    Years: 20.00    Pack years: 15.00    Types: Cigarettes    Quit date: 01/31/2018    Years since quitting: 1.8  . Smokeless tobacco: Never Used  . Tobacco comment: marijuana trying to decrease from 20 blunts day to 5 now  Vaping Use  . Vaping Use: Former  Substance Use Topics  . Alcohol use: Yes    Alcohol/week: 1.0 standard drink    Types: 1 Glasses of wine per week    Comment: rare - no liquor since april 2020/ drinks wine./ update 04/22/19 drinks at special occasions    . Drug use: Yes    Frequency: 70.0 times per week    Types: Marijuana    Comment: 20 blunts a day now to 5 a day - trying to quit./  update 04/22/19- maybe 10 blunts a day    No Known Allergies  Current Outpatient Medications  Medication Sig Dispense Refill  . dicyclomine (BENTYL) 10 MG capsule Take 1 capsule (10 mg total) by mouth 2 (two) times daily before a meal. (Patient not taking: Reported on 12/23/2019) 20 capsule 0  . fluticasone furoate-vilanterol (BREO ELLIPTA) 200-25 MCG/INH AEPB Inhale 1 puff into the lungs daily. (Patient not taking: Reported on 12/23/2019) 28 each 0  . metroNIDAZOLE (FLAGYL) 500 MG tablet Take 1 tablet (500 mg total) by mouth 2 (two) times daily. (Patient not taking: Reported on 12/23/2019) 14 tablet 0  . tinidazole (TINDAMAX) 500 MG tablet Take 2 tablets (1,000 mg total) by mouth daily with breakfast. (Patient not taking: Reported on 12/23/2019) 10 tablet 2   No current facility-administered medications for this visit.    Review of Systems Review of Systems Constitutional: negative for fatigue and weight loss Respiratory: negative for cough and wheezing Cardiovascular: negative for chest pain, fatigue and palpitations Gastrointestinal: negative for abdominal pain and change in bowel habits Genitourinary: positive for vaginal discharge Integument/breast: negative for nipple discharge Musculoskeletal:negative for myalgias Neurological: negative  for gait problems and tremors Behavioral/Psych: negative for abusive relationship, depression Endocrine: negative for temperature intolerance      Blood pressure (!) 131/92, pulse 91, weight 121 lb (54.9 kg), last menstrual period 12/03/2019.  Physical Exam Physical Exam           General: Alert and no distress Abdomen:  normal findings: no organomegaly, soft, non-tender and no hernia  Pelvis:  External genitalia: normal general appearance Urinary system: urethral meatus normal and bladder without fullness, nontender Vaginal: normal without tenderness, induration or masses Cervix: normal appearance Adnexa: normal bimanual exam Uterus:  anteverted and non-tender, normal size    50% of 15 min visit spent on counseling and coordination of care.   Data Reviewed Wet Prep and Cultures  Assessment     1. Vaginal discharge Rx: - Cervicovaginal ancillary only( [place])  2. Screen for STD (sexually transmitted disease) Rx: - Hepatitis B surface antigen - Hepatitis C antibody - HIV Antibody (routine testing w rflx) - RPR    Plan   Follow up in 2 weeks  Orders Placed This Encounter  Procedures  . Hepatitis B surface antigen  . Hepatitis C antibody  . HIV Antibody (routine testing w rflx)  . RPR   No orders of the defined types were placed in this encounter.    Shelly Bombard, MD 12/23/2019 4:48 PM

## 2019-12-24 ENCOUNTER — Other Ambulatory Visit: Payer: Self-pay | Admitting: Obstetrics

## 2019-12-24 DIAGNOSIS — B9689 Other specified bacterial agents as the cause of diseases classified elsewhere: Secondary | ICD-10-CM

## 2019-12-24 DIAGNOSIS — N760 Acute vaginitis: Secondary | ICD-10-CM

## 2019-12-24 LAB — HEPATITIS B SURFACE ANTIGEN: Hepatitis B Surface Ag: NEGATIVE

## 2019-12-24 LAB — CERVICOVAGINAL ANCILLARY ONLY
Bacterial Vaginitis (gardnerella): POSITIVE — AB
Candida Glabrata: NEGATIVE
Candida Vaginitis: NEGATIVE
Chlamydia: NEGATIVE
Comment: NEGATIVE
Comment: NEGATIVE
Comment: NEGATIVE
Comment: NEGATIVE
Comment: NEGATIVE
Comment: NORMAL
Neisseria Gonorrhea: NEGATIVE
Trichomonas: NEGATIVE

## 2019-12-24 LAB — RPR: RPR Ser Ql: NONREACTIVE

## 2019-12-24 LAB — HEPATITIS C ANTIBODY: Hep C Virus Ab: <0.1 s/co ratio (ref 0.0–0.9)

## 2019-12-24 LAB — HIV ANTIBODY (ROUTINE TESTING W REFLEX): HIV Screen 4th Generation wRfx: NONREACTIVE

## 2019-12-24 MED ORDER — METRONIDAZOLE 0.75 % VA GEL: 1.0000 | Freq: Two times a day (BID) | VAGINAL | 5 refills | Status: DC

## 2019-12-30 ENCOUNTER — Encounter (HOSPITAL_COMMUNITY): Payer: Self-pay

## 2020-01-13 ENCOUNTER — Encounter: Payer: Self-pay | Admitting: Podiatry

## 2020-01-13 ENCOUNTER — Other Ambulatory Visit: Payer: Self-pay

## 2020-01-13 ENCOUNTER — Ambulatory Visit: Payer: Medicaid Other | Admitting: Podiatry

## 2020-01-13 DIAGNOSIS — R52 Pain, unspecified: Secondary | ICD-10-CM | POA: Diagnosis not present

## 2020-01-13 DIAGNOSIS — M79672 Pain in left foot: Secondary | ICD-10-CM | POA: Diagnosis not present

## 2020-01-13 DIAGNOSIS — L905 Scar conditions and fibrosis of skin: Secondary | ICD-10-CM

## 2020-01-19 NOTE — Progress Notes (Signed)
Subjective:  [name], female    DOB: Jun 11, 1975,  MRN: [id number]  44 y.o. female presents with follow up painful surgical scar plantar aspect left foot. Pain interferes with her daily activities such as caring for her son.   She would like to discuss the fat injection procedure for her painful postoperative scar. She states she did get lesion shaed and it feels better.  Review of Systems: Negative except as noted in the HPI. Denies N/V/F/Ch. Past Medical History:  Diagnosis Date  . Allergy   . Asthma   . Headache   . 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 Migraines   . Pneumonia   . Prior miscarriage with pregnancy in first trimester, antepartum   . Vaginal Pap smear, abnormal    Past Surgical History:  Procedure Laterality Date  . CERVICAL CERCLAGE N/A 02/26/2015   Procedure: CERCLAGE CERVICAL;  Surgeon: Frederico Hamman, MD;  Location: [facility];  Service: Gynecology;  Laterality: N/A;  . DILATION AND CURETTAGE OF UTERUS    . DILATION AND EVACUATION N/A 05/22/2012   Procedure: DILATATION AND EVACUATION;  Surgeon: Frederico Hamman, MD;  Location: [facility];  Service: Gynecology;  Laterality: N/A;  . FOOT SURGERY     Patient Active Problem List   Diagnosis Date Noted  . Dysuria 11/28/2019  . Vaginal discharge 11/28/2019  . Marijuana use 08/21/2019  . Anxiety 08/21/2019  . COVID-19 virus detected 03/07/2019  . Personal history of multiple sclerosis ([place]) 08/21/2018  . Persistent depressive disorder with anxious distress, currently moderate 08/21/2018  . Shortness of breath 08/21/2018  . 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 Pulmonary nodules 08/21/2018  . Substance abuse ([place]) 08/21/2018  . Cocaine abuse ([place]) 08/21/2018  . Bronchitis, acute 04/03/2018  . Seasonal allergies 01/18/2016  . Compliance poor 01/18/2016  . Asthma 12/07/2015  . Goiter diffuse 12/07/2015  . NSVD (normal spontaneous vaginal delivery) 06/13/2015  . Pregnancy induced hypertension 06/12/2015  .  Preeclampsia 06/10/2015  . Short cervix affecting pregnancy 02/25/2015  . Advanced maternal age in multigravida 02/12/2015  . Left thyroid nodule 10/25/2011    Current Outpatient Medications:  .  dicyclomine (BENTYL) 10 MG capsule, Take 1 capsule (10 mg total) by mouth 2 (two) times daily before a meal. (Patient not taking: Reported on 12/23/2019), Disp: 20 capsule, Rfl: 0 .  fluconazole (DIFLUCAN) 150 MG tablet, Take 150 mg by mouth once., Disp: , Rfl:  .  fluticasone furoate-vilanterol (BREO ELLIPTA) 200-25 MCG/INH AEPB, Inhale 1 puff into the lungs daily. (Patient not taking: Reported on 12/23/2019), Disp: 28 each, Rfl: 0 .  metroNIDAZOLE (FLAGYL) 500 MG tablet, Take 1 tablet (500 mg total) by mouth 2 (two) times daily. (Patient not taking: Reported on 12/23/2019), Disp: 14 tablet, Rfl: 0 .  metroNIDAZOLE (METROGEL VAGINAL) 0.75 % vaginal gel, Place 1 Applicatorful vaginally 2 (two) times daily., Disp: 70 g, Rfl: 5 .  tinidazole (TINDAMAX) 500 MG tablet, Take 2 tablets (1,000 mg total) by mouth daily with breakfast. (Patient not taking: Reported on 12/23/2019), Disp: 10 tablet, Rfl: 2 No Known Allergies Social History   Tobacco Use  Smoking Status Former Smoker  . Packs/day: 0.75  . Years: 20.00  . Pack years: 15.00  . Types: Cigarettes  . Quit date: 01/31/2018  . Years since quitting: 1.9  Smokeless Tobacco Never Used  Tobacco Comment   marijuana trying to decrease from 20 blunts day to 5 now   Objective:  There were no vitals filed for this  visit. Constitutional Patient is a pleasant 44 y.o. African American female, WD, WN in NAD.Marland Kitchen  Vascular Neurovascular status unchanged b/l lower extremities. Capillary refill time to digits immediate b/l. Palpable pedal pulses b/l LE. Pedal hair present. Lower extremity skin temperature gradient within normal limits. No pain with calf compression b/l. No edema noted b/l lower extremities. Capillary refill normal to all digits.  No cyanosis or  clubbing noted.  Neurologic Normal speech. Oriented to person, place, and time. Protective sensation intact 5/5 intact bilaterally with 10g monofilament b/l. Vibratory sensation intact b/l.  Dermatologic Pedal skin with normal turgor, texture and tone bilaterally. No open wounds bilaterally. No interdigital macerations bilaterally. Hyperkeratotic keloid scar noted plantarlateral aspect of left foot with tenderness to palpation. No surrounding erythema, no edema, no drainage.  Orthopedic: Normal muscle strength 5/5 to all lower extremity muscle groups bilaterally. No pain crepitus or joint limitation noted with ROM b/l. No gross bony deformities bilaterally. Patient ambulates independent of any assistive aids.   Radiographs: None Assessment:   1. Painful cutaneous scar   2. Pain in left foot    Plan:  -Patient was evaluated and treated and all questions answered. -Educated on self-care -No new findings. No new orders. -Trimmed painful cutaneous scar to patient tolerance. Applied non-medicated horseshoe pad.  -Discussed potential treatment of fat injection under scar to reduce pain. I did speak with Dr. March Rummage and the procedure is not covered by her insurance. -She is open to a second opinion and I advised consultation with Dr. Sherryle Lis so he can evaluate her lesion and see if he has any recommendations. She can follow up with me in 3 months. -Advised continued use of nonmedicated horseshoe or aperture pads for daily protection between visits.  Return in about 3 months (around 04/12/2020).  Marzetta Board, DPM

## 2020-01-20 ENCOUNTER — Encounter (HOSPITAL_COMMUNITY): Payer: Self-pay | Admitting: Psychiatry

## 2020-01-20 ENCOUNTER — Other Ambulatory Visit: Payer: Self-pay

## 2020-01-20 ENCOUNTER — Ambulatory Visit (INDEPENDENT_AMBULATORY_CARE_PROVIDER_SITE_OTHER): Payer: Medicaid Other | Admitting: Psychiatry

## 2020-01-20 VITALS — BP 110/80 | HR 85 | Ht 62.0 in | Wt 129.0 lb

## 2020-01-20 DIAGNOSIS — F341 Dysthymic disorder: Secondary | ICD-10-CM

## 2020-01-20 NOTE — Progress Notes (Signed)
[facility] MD/PA/NP OP Progress Note  01/21/2020 11:34 AM Catherine Osborne  MRN:  [id number]  Chief Complaint: " I am trying hard not to get in legal trouble."   HPI: Patient reported that she recently started a new job at [employer] however the environment has not been conducive for her.  She stated that she does not like a lot of things that are being done there including their hygiene levels.  She stated that since she is very vocal about her thoughts manager and the other people that were not there did not like her already.  She stated that she is very close to quitting.  She wants to continue working for a few more weeks and then quit.  She stated that she feels like the people that work there are purposely pressing her buttons to make her more angry.  She stated that she kicked a trash can the other day at work and that is when one of her colleagues told her if she needs something for anxiety.  She stated that she wanted stepfather she know about anxiety. She spoke in detail about her current housing situation and how she is living in this complex where she is required to hold a job so that she can show it on paper about her salary and wages.  She informed that she was supposed to be out of that place 2 years ago however she has lived there for 4 years now.  She stated that she was pregnant when she moved in there and then eventually she tried to get back on her feet however Covid and other issues made it very difficult. 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 She stated that she still does work on the side doing-however she needs to have something more solid. She spoke about her older son not wanting to look after her younger son when she is out working.  She stated that childcare issues also make it difficult for her to get a steady job.  She ruminated about how she has anger problems and how she has  gotten legal trouble in the past.  Once again the writer asked her if she wants to be prescribed any medication to help her with that, she replied no because when she is off her medications she can think more clearly that she likes it that way.  She still uses sertraline only as needed however that makes her quite sleepy.  She mentioned that she is about to get a new vehicle and she is looking forward to that.  Visit Diagnosis:    ICD-10-CM   1. Persistent depressive disorder with anxious distress, currently moderate  F34.1     Past Psychiatric History: MDD  Past Medical History:  Past Medical History:  Diagnosis Date  . Allergy   . Asthma   . Headache   . 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 Migraines   . Pneumonia   . Prior miscarriage with pregnancy in first trimester, antepartum   . Vaginal Pap smear, abnormal     Past Surgical History:  Procedure Laterality Date  . CERVICAL CERCLAGE N/A 02/26/2015   Procedure: CERCLAGE CERVICAL;  Surgeon: Frederico Hamman, MD;  Location: [facility];  Service: Gynecology;  Laterality: N/A;  . DILATION AND CURETTAGE OF UTERUS    . DILATION AND EVACUATION N/A 05/22/2012   Procedure: DILATATION AND EVACUATION;  Surgeon: Frederico Hamman, MD;  Location: [facility];  Service: Gynecology;  Laterality: N/A;  . FOOT SURGERY      Family Psychiatric History: denied  Family History:  Family History  Adopted: Yes  Problem Relation Age of Onset  . Bone cancer Mother   . Breast cancer Maternal Grandmother   . Diabetes Other   . Hypertension Other   . Stroke Other   . Heart attack Other     Social History:  Social History   Socioeconomic History  . Marital status: Significant Other    Spouse name: Not on file  . Number of children: 2  . Years of education: Not on file  . Highest education level: Some college, no degree  Occupational History  . Not on file  Tobacco Use  . Smoking status: Former Smoker    Packs/day: 0.75    Years: 20.00     Pack years: 15.00    Types: Cigarettes    Quit date: 01/31/2018    Years since quitting: 1.9  . Smokeless tobacco: Never Used  . Tobacco comment: marijuana trying to decrease from 20 blunts day to 5 now  Vaping Use  . Vaping Use: Former  Substance and Sexual Activity  . Alcohol use: Yes    Alcohol/week: 1.0 standard drink    Types: 1 Glasses of wine per week    Comment: rare - no liquor since april 2020/ drinks wine./ update 04/22/19 drinks at special occasions    . Drug use: Yes    Frequency: 70.0 times per week    Types: Marijuana    Comment: 20 blunts a day now to 5 a day - trying to quit./ update 04/22/19- maybe 10 blunts a day  . Sexual activity: Yes    Partners: Male    Birth control/protection: None  Other Topics Concern  . Not on file  Social History Narrative   Lives top of 3 story home; lives with family; right handed; some college; little caffeine - drinks in winter; exercise none      Update 04/22/2019   Lives with her sons   Caffeine: Dr Malachi Bonds 1-2 per day   Social Determinants of Health   Financial Resource Strain: Not on file  Food Insecurity: Not on file  Transportation Needs: Not on file  Physical Activity: Not on file  Stress: Not on file  Social Connections: Not on file    Allergies: No Known Allergies  Metabolic Disorder Labs: Lab Results  Component Value Date   HGBA1C 5.2 11/16/2018   No results found for: PROLACTIN No results found for: CHOL, TRIG, HDL, CHOLHDL, VLDL, LDLCALC Lab Results  Component Value Date   TSH 0.863 04/11/2019   TSH 1.490 11/16/2018    Therapeutic Level Labs: No results found for: LITHIUM No results found for: VALPROATE No components found for:  CBMZ  Current Medications: Current Outpatient Medications  Medication Sig Dispense Refill  . metroNIDAZOLE (METROGEL VAGINAL) 0.75 % vaginal gel Place 1 Applicatorful vaginally 2 (two) times daily. 70 g 5   No current facility-administered medications for this visit.      Musculoskeletal: Strength & Muscle Tone: within normal limits Gait & Station: normal Patient leans: N/A  Psychiatric Specialty Exam: Review of Systems  Blood pressure 110/80, pulse 85, height 5\' 2"  (1.575 m), weight 129 lb (58.5 kg), SpO2 100 %.Body mass index is 23.59 kg/m.  General Appearance: Fairly Groomed  Eye Contact:  Good  Speech:  Clear and Coherent and Normal Rate  Volume:  loud at times  Mood:  Irritable  Affect:  Congruent  Thought Process:  Goal Directed and Descriptions of Associations: Intact  Orientation:  Full (Time, Place, and Person)  Thought Content: Logical   Suicidal Thoughts:  No  Homicidal Thoughts:  No  Memory:  Immediate;   Good Recent;   Good  Judgement:  Fair  Insight:  Fair  Psychomotor Activity:  Normal  Concentration:  Concentration: Good and Attention Span: Good  Recall:  Good  Fund of Knowledge: Good  Language: Good  Akathisia:  Negative  Handed:  Right  AIMS (if indicated): 0  Assets:  Communication Skills Desire for Improvement Financial Resources/Insurance Housing  ADL's:  Intact  Cognition: WNL  Sleep:  Fair   Screenings: GAD-7   Flowsheet Row Office Visit from 08/14/2019 in [facility] Office Visit from 04/11/2019 in [facility] Office Visit from 11/16/2018 in [facility]  Total GAD-7 Score 19 3 18     PHQ2-9   [facility] [facility] Office Visit from 08/14/2019 in [facility] Office Visit from 04/11/2019 in [facility] Visit from 11/16/2018 in [facility] US OB MFM FOLLOW UP from [facility] MFM FOLLOW UP from [facility]24/2015 [facility]  PHQ-2 Total Score 2 3 3  0 0  PHQ-9 Total Score 11 9 9  -- --       Assessment and Plan:  1. Persistent depressive disorder with  anxious distress, currently moderate Patient continues to be irritable and continues to deal with numerous psychosocial stressors including being a single parent with very limited financial income.  She is living in a housing complex her women only financial support.  She is struggling to find and keep a decent job at present. She is still not interested in taking her medication sertraline on a regular basis.  She does not want to try any medications at this time.  We will follow up in 2 months as per patient request.   Nevada Crane, MD 01/21/2020, 11:34 AM

## 2020-01-21 ENCOUNTER — Encounter (HOSPITAL_COMMUNITY): Payer: Self-pay | Admitting: Psychiatry

## 2020-01-26 ENCOUNTER — Other Ambulatory Visit: Payer: Self-pay | Admitting: Pulmonary Disease

## 2020-01-27 ENCOUNTER — Other Ambulatory Visit: Payer: Self-pay

## 2020-01-31 ENCOUNTER — Emergency Department (HOSPITAL_COMMUNITY): Payer: Medicaid Other

## 2020-01-31 ENCOUNTER — Encounter (HOSPITAL_COMMUNITY): Payer: Self-pay

## 2020-01-31 ENCOUNTER — Other Ambulatory Visit: Payer: Self-pay

## 2020-01-31 ENCOUNTER — Emergency Department (HOSPITAL_COMMUNITY)
Admission: EM | Admit: 2020-01-31 | Discharge: 2020-01-31 | Disposition: A | Discharge status: Home / Self Care | Payer: Medicaid Other | Attending: Emergency Medicine | Admitting: Emergency Medicine

## 2020-01-31 DIAGNOSIS — R1030 Lower abdominal pain, unspecified: Secondary | ICD-10-CM

## 2020-01-31 DIAGNOSIS — R1031 Right lower quadrant pain: Secondary | ICD-10-CM | POA: Diagnosis not present

## 2020-01-31 DIAGNOSIS — Z87891 Personal history of nicotine dependence: Secondary | ICD-10-CM | POA: Diagnosis not present

## 2020-01-31 DIAGNOSIS — J45909 Unspecified asthma, uncomplicated: Secondary | ICD-10-CM | POA: Insufficient documentation

## 2020-01-31 DIAGNOSIS — Z8616 Personal history of COVID-19: Secondary | ICD-10-CM | POA: Insufficient documentation

## 2020-01-31 LAB — LIPASE, BLOOD: Lipase: 32 U/L (ref 11–51)

## 2020-01-31 LAB — URINALYSIS, ROUTINE W REFLEX MICROSCOPIC
Bilirubin Urine: NEGATIVE
Glucose, UA: NEGATIVE mg/dL
Hgb urine dipstick: NEGATIVE
Ketones, ur: NEGATIVE mg/dL
Nitrite: NEGATIVE
Protein, ur: NEGATIVE mg/dL
Specific Gravity, Urine: 1.018 (ref 1.005–1.030)
pH: 8 (ref 5.0–8.0)

## 2020-01-31 LAB — CBC
HCT: 40.1 % (ref 36.0–46.0)
Hemoglobin: 13.8 g/dL (ref 12.0–15.0)
MCH: 29.9 pg (ref 26.0–34.0)
MCHC: 34.4 g/dL (ref 30.0–36.0)
MCV: 87 fL (ref 80.0–100.0)
Platelets: 271 10*3/uL (ref 150–400)
RBC: 4.61 MIL/uL (ref 3.87–5.11)
RDW: 14 % (ref 11.5–15.5)
WBC: 8.7 10*3/uL (ref 4.0–10.5)
nRBC: 0 % (ref 0.0–0.2)

## 2020-01-31 LAB — COMPREHENSIVE METABOLIC PANEL
ALT: 18 U/L (ref 0–44)
AST: 18 U/L (ref 15–41)
Albumin: 3.5 g/dL (ref 3.5–5.0)
Alkaline Phosphatase: 48 U/L (ref 38–126)
Anion gap: 12 (ref 5–15)
BUN: 11 mg/dL (ref 6–20)
CO2: 18 mmol/L — ABNORMAL LOW (ref 22–32)
Calcium: 8.9 mg/dL (ref 8.9–10.3)
Chloride: 108 mmol/L (ref 98–111)
Creatinine, Ser: 0.74 mg/dL (ref 0.44–1.00)
GFR, Estimated: >60 mL/min (ref >60)
Glucose, Bld: 96 mg/dL (ref 70–99)
Potassium: 4.2 mmol/L (ref 3.5–5.1)
Sodium: 138 mmol/L (ref 135–145)
Total Bilirubin: 0.7 mg/dL (ref 0.3–1.2)
Total Protein: 6.2 g/dL — ABNORMAL LOW (ref 6.5–8.1)

## 2020-01-31 LAB — I-STAT BETA HCG BLOOD, ED (MC, WL, AP ONLY): I-stat hCG, quantitative: <5 m[IU]/mL (ref <5)

## 2020-01-31 MED ORDER — IOHEXOL 300 MG/ML  SOLN
100.0000 mL | Freq: Once | INTRAMUSCULAR | Status: AC | PRN
  Administered 2020-01-31: 100 mL via INTRAVENOUS

## 2020-01-31 MED ORDER — ONDANSETRON HCL 4 MG/2ML IJ SOLN
4.0000 mg | Freq: Once | INTRAMUSCULAR | Status: AC
  Administered 2020-01-31: 4 mg via INTRAVENOUS
  Filled 2020-01-31: qty 2

## 2020-01-31 MED ORDER — HYDROMORPHONE HCL 1 MG/ML IJ SOLN
0.5000 mg | Freq: Once | INTRAMUSCULAR | Status: AC
  Administered 2020-01-31: 0.5 mg via INTRAVENOUS
  Filled 2020-01-31: qty 1

## 2020-01-31 MED ORDER — OXYCODONE HCL 5 MG PO TABS
5.0000 mg | ORAL_TABLET | Freq: Four times a day (QID) | ORAL | 0 refills | Status: DC | PRN
Start: 2020-01-31 — End: 2020-02-20

## 2020-01-31 MED ORDER — ONDANSETRON 4 MG PO TBDP: 4.0000 mg | ORAL_TABLET | Freq: Three times a day (TID) | ORAL | 0 refills | Status: DC | PRN

## 2020-01-31 MED ORDER — OXYCODONE-ACETAMINOPHEN 5-325 MG PO TABS
1.0000 | ORAL_TABLET | Freq: Once | ORAL | Status: AC
  Administered 2020-01-31: 1 via ORAL
  Filled 2020-01-31: qty 1

## 2020-01-31 NOTE — ED Notes (Signed)
To Ultrasound

## 2020-01-31 NOTE — Discharge Instructions (Signed)
Please read and follow all provided instructions.  Your diagnoses today include:  1. Lower abdominal pain     TTests performed today include:  Blood cell counts and platelets  Kidney and liver function tests  Pancreas function test (called lipase)  Urine test to look for infection  A blood or urine test for pregnancy (women only)  Ultrasound of the pelvic organs -show fibroids, no other problems  CT scan of the abdomen -shows possible mild inflammation in the large intestine, otherwise no significant problems  Vital signs. See below for your results today.   Medications prescribed:   Oxycodone - narcotic pain medication  DO NOT drive or perform any activities that require you to be awake and alert because this medicine can make you drowsy.    Zofran (ondansetron) - for nausea and vomiting  Take any prescribed medications only as directed.  Home care instructions:   Follow any educational materials contained in this packet.    You should rest for the next several days. Keep drinking plenty of fluids and use the medicine for nausea as directed.    Drink clear liquids for the next 24 hours and introduce solid foods slowly after 24 hours using the b.r.a.t. diet (Bananas, Rice, Applesauce, Toast, Yogurt).    Follow-up instructions: Please follow-up with your primary care provider in the next 2-3 days for further evaluation of your symptoms. If you are not feeling better in 48 hours you may have a condition that is more serious and you need re-evaluation.   Return instructions:  SEEK IMMEDIATE MEDICAL ATTENTION IF:  If you have pain that does not go away or becomes severe   A temperature above 101F develops   Repeated vomiting occurs (multiple episodes)   If you have pain that becomes localized to portions of the abdomen. The right side could possibly be appendicitis. In an adult, the left lower portion of the abdomen could be colitis or diverticulitis.   Blood is  being passed in stools or vomit (bright red or black tarry stools)   You develop chest pain, difficulty breathing, dizziness or fainting, or become confused, poorly responsive, or inconsolable (young children)  If you have any other emergent concerns regarding your health  Additional Information: Abdominal (belly) pain can be caused by many things. Your caregiver performed an examination and possibly ordered blood/urine tests and imaging (CT scan, x-rays, ultrasound). Many cases can be observed and treated at home after initial evaluation in the emergency department. Even though you are being discharged home, abdominal pain can be unpredictable. Therefore, you need a repeated exam if your pain does not resolve, returns, or worsens. Most patients with abdominal pain don't have to be admitted to the hospital or have surgery, but serious problems like appendicitis and gallbladder attacks can start out as nonspecific pain. Many abdominal conditions cannot be diagnosed in one visit, so follow-up evaluations are very important.  Your vital signs today were: BP 114/79   Pulse 69   Temp 98.6 F (37 C) (Oral)   Resp 18   SpO2 98%  If your blood pressure (bp) was elevated above 135/85 this visit, please have this repeated by your doctor within one month. --------------

## 2020-01-31 NOTE — ED Provider Notes (Signed)
[facility] EMERGENCY DEPARTMENT Provider Note   CSN: [id number] Arrival date & time: 01/31/20  0251     History Chief Complaint  Patient presents with  . Abdominal Pain    Catherine Osborne is a 44 y.o. female.  Patient with history of uterine fibroids, recurrent BV, normal Pap smear and endometrial biopsy in the past year --presents the emergency department today for lower abdominal pain.  Patient states that her menstrual period came on about a week early starting on 12/25.  This lasted until about 2 days ago and resolved.  Patient has had gradually worsening pain in her lower abdomen.  Was acutely worse this morning prompting emergency department visit.  Patient states that it woke her from sleep.  No associated chest pain or shortness of breath.  No reported fevers, nausea, vomiting, diarrhea.  She denies current vaginal discharge.  She states she has had pain like this in the past and was evaluated but she is not able to tell me what is the cause of the pain.  EMS was called for transport to the hospital.  They administered 100 mcg of fentanyl in route.  Patient denies history of abdominal surgeries.         Past Medical History:  Diagnosis Date  . Allergy   . Asthma   . Headache   . 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 Migraines   . Pneumonia   . Prior miscarriage with pregnancy in first trimester, antepartum   . Vaginal Pap smear, abnormal     Patient Active Problem List   Diagnosis Date Noted  . Dysuria 11/28/2019  . Vaginal discharge 11/28/2019  . Marijuana use 08/21/2019  . Anxiety 08/21/2019  . COVID-19 virus detected 03/07/2019  . Personal history of multiple sclerosis ([place]) 08/21/2018  . Persistent depressive disorder with anxious distress, currently moderate 08/21/2018  . Shortness of breath 08/21/2018  . 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 Pulmonary nodules 08/21/2018  . Substance abuse ([place]) 08/21/2018  . Cocaine abuse ([place]) 08/21/2018  .  Bronchitis, acute 04/03/2018  . Seasonal allergies 01/18/2016  . Compliance poor 01/18/2016  . Asthma 12/07/2015  . Goiter diffuse 12/07/2015  . NSVD (normal spontaneous vaginal delivery) 06/13/2015  . Pregnancy induced hypertension 06/12/2015  . Preeclampsia 06/10/2015  . Short cervix affecting pregnancy 02/25/2015  . Advanced maternal age in multigravida 02/12/2015  . Left thyroid nodule 10/25/2011    Past Surgical History:  Procedure Laterality Date  . CERVICAL CERCLAGE N/A 02/26/2015   Procedure: CERCLAGE CERVICAL;  Surgeon: Frederico Hamman, MD;  Location: [facility];  Service: Gynecology;  Laterality: N/A;  . DILATION AND CURETTAGE OF UTERUS    . DILATION AND EVACUATION N/A 05/22/2012   Procedure: DILATATION AND EVACUATION;  Surgeon: Frederico Hamman, MD;  Location: [facility];  Service: Gynecology;  Laterality: N/A;  . FOOT SURGERY       OB History    Gravida  14   Para  2   Term  1   Preterm  1   AB  12   Living  1     SAB  10   IAB  2   Ectopic      Multiple  0   Live Births  1           Family History  Adopted: Yes  Problem Relation Age of Onset  . Bone cancer Mother   . Breast cancer Maternal Grandmother   . Diabetes Other   .  Hypertension Other   . Stroke Other   . Heart attack Other     Social History   Tobacco Use  . Smoking status: Former Smoker    Packs/day: 0.75    Years: 20.00    Pack years: 15.00    Types: Cigarettes    Quit date: 01/31/2018    Years since quitting: 2.0  . Smokeless tobacco: Never Used  . Tobacco comment: marijuana trying to decrease from 20 blunts day to 5 now  Vaping Use  . Vaping Use: Former  Substance Use Topics  . Alcohol use: Yes    Alcohol/week: 1.0 standard drink    Types: 1 Glasses of wine per week    Comment: rare - no liquor since april 2020/ drinks wine./ update 04/22/19 drinks at special occasions    . Drug use: Yes    Frequency: 70.0 times per week    Types: Marijuana    Comment: 20 blunts a  day now to 5 a day - trying to quit./ update 04/22/19- maybe 10 blunts a day    Home Medications Prior to Admission medications   Medication Sig Start Date End Date Taking? Authorizing Provider  metroNIDAZOLE (METROGEL VAGINAL) 0.75 % vaginal gel Place 1 Applicatorful vaginally 2 (two) times daily. 12/24/19   Brock Bad, MD  nortriptyline (PAMELOR) 10 MG capsule Take 1 capsule (10 mg total) by mouth at bedtime. Patient not taking: No sig reported 04/22/19 07/14/19  Anson Fret, MD    Allergies    Patient has no known allergies.  Review of Systems   Review of Systems  Constitutional: Negative for fever.  HENT: Negative for rhinorrhea and sore throat.   Eyes: Negative for redness.  Respiratory: Negative for cough.   Cardiovascular: Negative for chest pain.  Gastrointestinal: Positive for abdominal pain. Negative for diarrhea, nausea and vomiting.  Genitourinary: Negative for dysuria, frequency, hematuria, urgency and vaginal bleeding (recently stopped).  Musculoskeletal: Negative for myalgias.  Skin: Negative for rash.  Neurological: Negative for headaches.    Physical Exam Updated Vital Signs BP (!) 148/87 (BP Location: Right Arm)   Pulse 93   Temp 98.6 F (37 C) (Oral)   Resp 18   SpO2 100%   Physical Exam Vitals and nursing note reviewed.  Constitutional:      General: She is not in acute distress.    Appearance: She is well-developed.  HENT:     Head: Normocephalic and atraumatic.     Right Ear: External ear normal.     Left Ear: External ear normal.     Nose: Nose normal.  Eyes:     Conjunctiva/sclera: Conjunctivae normal.  Cardiovascular:     Rate and Rhythm: Normal rate and regular rhythm.     Heart sounds: No murmur heard.   Pulmonary:     Effort: No respiratory distress.     Breath sounds: No wheezing, rhonchi or rales.  Abdominal:     Palpations: Abdomen is soft.     Tenderness: There is abdominal tenderness (Patient tearful to palpation of  the abdomen bilateral lower quadrants) in the right lower quadrant, periumbilical area, suprapubic area and left lower quadrant. There is no guarding or rebound.  Musculoskeletal:     Cervical back: Normal range of motion and neck supple.     Right lower leg: No edema.     Left lower leg: No edema.  Skin:    General: Skin is warm and dry.     Findings: No rash.  Neurological:     General: No focal deficit present.     Mental Status: She is alert. Mental status is at baseline.     Motor: No weakness.  Psychiatric:        Mood and Affect: Mood normal.     ED Results / Procedures / Treatments   Labs (all labs ordered are listed, but only abnormal results are displayed) Labs Reviewed  COMPREHENSIVE METABOLIC PANEL - Abnormal; Notable for the following components:      Result Value   CO2 18 (*)    Total Protein 6.2 (*)    All other components within normal limits  URINALYSIS, ROUTINE W REFLEX MICROSCOPIC - Abnormal; Notable for the following components:   APPearance CLOUDY (*)    Leukocytes,Ua MODERATE (*)    Bacteria, UA RARE (*)    All other components within normal limits  LIPASE, BLOOD  CBC  I-STAT BETA HCG BLOOD, ED ([facility], [facility], [facility] ONLY)    EKG None  Radiology US PELVIC COMPLETE W TRANSVAGINAL AND TORSION R/O  Result Date: 01/31/2020 CLINICAL DATA:  Acute lower abdominal pain. EXAM: TRANSABDOMINAL AND TRANSVAGINAL ULTRASOUND OF PELVIS DOPPLER ULTRASOUND OF OVARIES TECHNIQUE: Both transabdominal and transvaginal ultrasound examinations of the pelvis were performed. Transabdominal technique was performed for global imaging of the pelvis including uterus, ovaries, adnexal regions, and pelvic cul-de-sac. It was necessary to proceed with endovaginal exam following the transabdominal exam to visualize the endometrium and ovaries. Color and duplex Doppler ultrasound was utilized to evaluate blood flow to the ovaries. COMPARISON:  December 11, 2018. FINDINGS: Uterus Measurements: 8.4 x  4.7 x 4.0 cm = volume: 83 mL. At least 3 small uterine fibroids are noted, the largest measuring 2.2 cm in the uterine fundus. Endometrium Thickness: 8 mm which is within normal limits. No focal abnormality visualized. Right ovary Measurements: 2.5 x 1.3 x 2.3 cm = volume: 6 mL. Normal appearance/no adnexal mass. Left ovary Measurements: 2.2 x 2.4 x 1.6 cm = volume: 5 mL. Normal appearance/no adnexal mass. Pulsed Doppler evaluation of both ovaries demonstrates normal low-resistance arterial and venous waveforms. Other findings No abnormal free fluid. IMPRESSION: Multiple small uterine fibroids are noted. No other abnormality seen in the pelvis. Electronically Signed   By: Marijo Conception M.D.   On: 01/31/2020 10:01    Procedures Procedures (including critical care time)  Medications Ordered in ED Medications  HYDROmorphone (DILAUDID) injection 0.5 mg (has no administration in time range)  ondansetron (ZOFRAN) injection 4 mg (has no administration in time range)    ED Course  I have reviewed the triage vital signs and the nursing notes.  Pertinent labs & imaging results that were available during my care of the patient were reviewed by me and considered in my medical decision making (see chart for details).  Patient seen and examined.  I-STAT beta-hCG was not initially run, performed and is negative.  Pelvic ultrasound ordered.  Patient will be given medication for pain control.  Vital signs reviewed and are as follows: BP (!) 148/87 (BP Location: Right Arm)   Pulse 93   Temp 98.6 F (37 C) (Oral)   Resp 18   SpO2 100%   10:22 AM pelvic ultrasound again shows small fibroids, no signs of ovarian torsion or other significant infection. Patient has not yet received any medication for pain. With repeat palpation of her abdomen, she bursts into tears and winces in pain. Discussed further work-up with CT imaging to rule out other potential causes of  her pain. She agrees to proceed.  2:40 PM CT  imaging completed earlier and shows question of mild colitis.  Also possible ileus.  No definite obstruction.  Patient updated on results.  She was given additional pain medication.  She continues to appear comfortable, however no vomiting.  She has ambulated to the restroom without any difficulty.  Vital signs remain normal.  Feel patient is stable for discharge home at this time.  Patient counseled on use of narcotic pain medications. Counseled not to combine these medications with others containing tylenol. Urged not to drink alcohol, drive, or perform any other activities that requires focus while taking these medications. The patient verbalizes understanding and agrees with the plan.  The patient was urged to return to the Emergency Department immediately with worsening of current symptoms, worsening abdominal pain, persistent vomiting, blood noted in stools, fever, or any other concerns. The patient verbalized understanding.      MDM Rules/Calculators/A&P                          Patient presents by EMS with abdominal pain. Vitals are stable, no fever. Labs are reassuring. Imaging pelvic ultrasound without signs of torsion, CT of the abdomen and pelvis with possible mild colitis, no severe or surgical findings. No signs of dehydration, patient is tolerating PO's. Lungs are clear and no signs suggestive of PNA. Low concern for appendicitis, cholecystitis, pancreatitis, ruptured viscus, UTI, kidney stone, aortic dissection, aortic aneurysm or other emergent abdominal etiology. Supportive therapy indicated with return if symptoms worsen.      Final Clinical Impression(s) / ED Diagnoses Final diagnoses:  Lower abdominal pain    Rx / DC Orders ED Discharge Orders         Ordered    oxyCODONE (OXY IR/ROXICODONE) 5 MG immediate release tablet  Every 6 hours PRN        01/31/20 1438    ondansetron (ZOFRAN ODT) 4 MG disintegrating tablet  Every 8 hours PRN        01/31/20 1438            Carlisle Cater, PA-C 01/31/20 1443    Lajean Saver, MD 01/31/20 [phone]

## 2020-01-31 NOTE — ED Triage Notes (Signed)
Pt comes in via [facility] for lower abd pain, sudden onset, some nausea, last BM yesterday, PTA received fentanyl

## 2020-02-02 ENCOUNTER — Emergency Department (HOSPITAL_COMMUNITY)
Admission: EM | Admit: 2020-02-02 | Discharge: 2020-02-03 | Disposition: A | Discharge status: Home / Self Care | Payer: Medicaid Other | Attending: Emergency Medicine | Admitting: Emergency Medicine

## 2020-02-02 ENCOUNTER — Encounter (HOSPITAL_COMMUNITY): Payer: Self-pay | Admitting: Emergency Medicine

## 2020-02-02 ENCOUNTER — Emergency Department (HOSPITAL_COMMUNITY): Payer: Medicaid Other

## 2020-02-02 DIAGNOSIS — Z87891 Personal history of nicotine dependence: Secondary | ICD-10-CM | POA: Insufficient documentation

## 2020-02-02 DIAGNOSIS — Z8616 Personal history of COVID-19: Secondary | ICD-10-CM | POA: Insufficient documentation

## 2020-02-02 DIAGNOSIS — J45909 Unspecified asthma, uncomplicated: Secondary | ICD-10-CM | POA: Insufficient documentation

## 2020-02-02 DIAGNOSIS — R109 Unspecified abdominal pain: Secondary | ICD-10-CM | POA: Diagnosis present

## 2020-02-02 DIAGNOSIS — N739 Female pelvic inflammatory disease, unspecified: Secondary | ICD-10-CM | POA: Diagnosis not present

## 2020-02-02 DIAGNOSIS — N760 Acute vaginitis: Secondary | ICD-10-CM | POA: Diagnosis not present

## 2020-02-02 DIAGNOSIS — B9689 Other specified bacterial agents as the cause of diseases classified elsewhere: Secondary | ICD-10-CM

## 2020-02-02 LAB — COMPREHENSIVE METABOLIC PANEL
ALT: 15 U/L (ref 0–44)
AST: 16 U/L (ref 15–41)
Albumin: 3.3 g/dL — ABNORMAL LOW (ref 3.5–5.0)
Alkaline Phosphatase: 52 U/L (ref 38–126)
Anion gap: 11 (ref 5–15)
BUN: 5 mg/dL — ABNORMAL LOW (ref 6–20)
CO2: 23 mmol/L (ref 22–32)
Calcium: 9.1 mg/dL (ref 8.9–10.3)
Chloride: 99 mmol/L (ref 98–111)
Creatinine, Ser: 0.76 mg/dL (ref 0.44–1.00)
GFR, Estimated: >60 mL/min (ref >60)
Glucose, Bld: 83 mg/dL (ref 70–99)
Potassium: 3.5 mmol/L (ref 3.5–5.1)
Sodium: 133 mmol/L — ABNORMAL LOW (ref 135–145)
Total Bilirubin: 1.2 mg/dL (ref 0.3–1.2)
Total Protein: 7.3 g/dL (ref 6.5–8.1)

## 2020-02-02 LAB — CBC
HCT: 44.1 % (ref 36.0–46.0)
Hemoglobin: 14.6 g/dL (ref 12.0–15.0)
MCH: 28.8 pg (ref 26.0–34.0)
MCHC: 33.1 g/dL (ref 30.0–36.0)
MCV: 87 fL (ref 80.0–100.0)
Platelets: 294 10*3/uL (ref 150–400)
RBC: 5.07 MIL/uL (ref 3.87–5.11)
RDW: 13.4 % (ref 11.5–15.5)
WBC: 9.3 10*3/uL (ref 4.0–10.5)
nRBC: 0 % (ref 0.0–0.2)

## 2020-02-02 LAB — WET PREP, GENITAL
Sperm: NONE SEEN
Trich, Wet Prep: NONE SEEN
Yeast Wet Prep HPF POC: NONE SEEN

## 2020-02-02 LAB — LIPASE, BLOOD: Lipase: 18 U/L (ref 11–51)

## 2020-02-02 LAB — I-STAT BETA HCG BLOOD, ED (MC, WL, AP ONLY): I-stat hCG, quantitative: <5 m[IU]/mL (ref <5)

## 2020-02-02 MED ORDER — PANTOPRAZOLE SODIUM 40 MG IV SOLR
40.0000 mg | Freq: Once | INTRAVENOUS | Status: AC
  Administered 2020-02-02: 40 mg via INTRAVENOUS
  Filled 2020-02-02: qty 40

## 2020-02-02 MED ORDER — LIDOCAINE VISCOUS HCL 2 % MT SOLN
15.0000 mL | Freq: Once | OROMUCOSAL | Status: AC
  Administered 2020-02-02: 15 mL via ORAL
  Filled 2020-02-02: qty 15

## 2020-02-02 MED ORDER — ALUM & MAG HYDROXIDE-SIMETH 200-200-20 MG/5ML PO SUSP
30.0000 mL | Freq: Once | ORAL | Status: AC
  Administered 2020-02-02: 30 mL via ORAL
  Filled 2020-02-02: qty 30

## 2020-02-02 MED ORDER — DOXYCYCLINE HYCLATE 100 MG PO CAPS: 100.0000 mg | ORAL_CAPSULE | Freq: Two times a day (BID) | ORAL | 0 refills | Status: AC

## 2020-02-02 MED ORDER — IOHEXOL 300 MG/ML  SOLN
100.0000 mL | Freq: Once | INTRAMUSCULAR | Status: AC | PRN
  Administered 2020-02-02: 100 mL via INTRAVENOUS

## 2020-02-02 MED ORDER — MORPHINE SULFATE (PF) 2 MG/ML IV SOLN
1.0000 mg | Freq: Once | INTRAVENOUS | Status: AC
  Administered 2020-02-03: 1 mg via INTRAVENOUS
  Filled 2020-02-02: qty 1

## 2020-02-02 MED ORDER — MORPHINE SULFATE (PF) 4 MG/ML IV SOLN
4.0000 mg | Freq: Once | INTRAVENOUS | Status: AC
  Administered 2020-02-02: 4 mg via INTRAVENOUS
  Filled 2020-02-02: qty 1

## 2020-02-02 MED ORDER — CEFTRIAXONE SODIUM 500 MG IJ SOLR
500.0000 mg | Freq: Once | INTRAMUSCULAR | Status: AC
  Administered 2020-02-03: 500 mg via INTRAMUSCULAR
  Filled 2020-02-02: qty 500

## 2020-02-02 MED ORDER — SODIUM CHLORIDE 0.9 % IV BOLUS
1000.0000 mL | Freq: Once | INTRAVENOUS | Status: AC
  Administered 2020-02-02: 1000 mL via INTRAVENOUS

## 2020-02-02 MED ORDER — METRONIDAZOLE 500 MG PO TABS: 500.0000 mg | ORAL_TABLET | Freq: Two times a day (BID) | ORAL | 0 refills | Status: AC

## 2020-02-02 NOTE — Discharge Instructions (Addendum)
Like we discussed, I prescribed you 2 medications.  The first medication is called metronidazole.  You are going to take this twice a day for the next 14 days.  Do not stop taking early.  This will help treat your bacterial vaginosis.  The second medication is called doxycycline.  This is a strong antibiotic that will help with your pelvic inflammatory disease.  You are going to take this twice a day for the next 14 days.  Do not stop taking this early.  If your symptoms worsen, you can always return to the emergency department.  It was a pleasure to meet you.  Please make sure you follow-up with your regular doctor soon as possible for reevaluation.
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
## 2020-02-02 NOTE — ED Triage Notes (Signed)
Pt to triage via [facility] from home.  Reports generalized abd pain x 4-5 days.  Seen here on 12/31 and states pain medication was working but she is out of meds now.  Denies nausea, vomiting, and diarrhea.  C/o abd swelling.

## 2020-02-02 NOTE — ED Triage Notes (Signed)
Patient here for evaluation of abdominal pain, was seen on 01/31/20 for same and reports no improvement in abdominal pain and increased abdominal distensions. Patient states she has taken all of the pain medication she was prescribed on 12/31.  Reports new urinary incontinence while taking pain medication.

## 2020-02-02 NOTE — ED Provider Notes (Signed)
[facility] EMERGENCY DEPARTMENT Provider Note   CSN: [id number] Arrival date & time: 02/02/20  1108     History Chief Complaint  Patient presents with   Abdominal Pain    Catherine Osborne is a 45 y.o. female.  HPI Patient is a 45 year old female with a medical history as noted below.  She presents to the emergency department today due to abdominal pain as well as abdominal distention.  She states her symptoms have been ongoing for the past 4 days.  She was seen 2 days ago with similar complaints.  CT scan as well as pelvic ultrasound obtained with findings as noted below.  She reports pain all of her abdomen that seems to be worse along the epigastrium.  No nausea, vomiting, or diarrhea.  She states she has not had a bowel movement for 4 days.  Patient noted urinary incontinence to the nursing staff.  Upon further questioning patient states that she has been sleeping due to the pain medications and is urinating while asleep and waking up in her urine.  This happened 1-2 times.  Patient also notes some mild vaginal discharge that started today.  She denies any recent sexual activity.  No fevers, chills, URI symptoms  CT scan of the abdomen and pelvis obtained 2 days ago:  IMPRESSION: 1. Question of haustral thickening the transverse colon raising the possibility of mild colitis. 2. Fluid-filled small bowel loops may reflect mild ileus or sequela of gastroenteritis. 3. Normal appendix 4. Small low-density lesion in the lower pole the LEFT kidney stable since 2018 but with density values that are indeterminate. Small hemorrhagic cyst is favored. Indolent renal neoplasm is a differential consideration. Consider nonemergent follow-up CT or MRI with and without contrast with renal protocol for further Evaluation.  Pelvic ultrasound obtained 2 days ago:  IMPRESSION: Multiple small uterine fibroids are noted. No other abnormality seen in the pelvis.     Past  Medical History:  Diagnosis Date   Allergy    Asthma    Headache    Lactose intolerance in adult    Migraines    Pneumonia    Prior miscarriage with pregnancy in first trimester, antepartum    Vaginal Pap smear, abnormal     Patient Active Problem List   Diagnosis Date Noted   Dysuria 11/28/2019   Vaginal discharge 11/28/2019   Marijuana use 08/21/2019   Anxiety 08/21/2019   COVID-19 virus detected 03/07/2019   Personal history of multiple sclerosis ([place]) 08/21/2018   Persistent depressive disorder with anxious distress, currently moderate 08/21/2018   Shortness of breath 08/21/2018   Renal mass 08/21/2018   Pulmonary nodules 08/21/2018   Substance abuse ([place]) 08/21/2018   Cocaine abuse ([place]) 08/21/2018   Bronchitis, acute 04/03/2018   Seasonal allergies 01/18/2016   Compliance poor 01/18/2016   Asthma 12/07/2015   Goiter diffuse 12/07/2015   NSVD (normal spontaneous vaginal delivery) 06/13/2015   Pregnancy induced hypertension 06/12/2015   Preeclampsia 06/10/2015   Short cervix affecting pregnancy 02/25/2015   Advanced maternal age in multigravida 02/12/2015   Left thyroid nodule 10/25/2011    Past Surgical History:  Procedure Laterality Date   CERVICAL CERCLAGE N/A 02/26/2015   Procedure: CERCLAGE CERVICAL;  Surgeon: Frederico Hamman, MD;  Location: [facility];  Service: Gynecology;  Laterality: N/A;   DILATION AND CURETTAGE OF UTERUS     DILATION AND EVACUATION N/A 05/22/2012   Procedure: DILATATION AND EVACUATION;  Surgeon: Frederico Hamman, MD;  Location: [facility];  Service: Gynecology;  Laterality: N/A;   FOOT SURGERY       OB History    Gravida  14   Para  2   Term  1   Preterm  1   AB  12   Living  1     SAB  10   IAB  2   Ectopic      Multiple  0   Live Births  1           Family History  Adopted: Yes  Problem Relation Age of Onset   Bone cancer Mother    Breast cancer Maternal Grandmother     Diabetes Other    Hypertension Other    Stroke Other    Heart attack Other     Social History   Tobacco Use   Smoking status: Former Smoker    Packs/day: 0.75    Years: 20.00    Pack years: 15.00    Types: Cigarettes    Quit date: 01/31/2018    Years since quitting: 2.0   Smokeless tobacco: Never Used   Tobacco comment: marijuana trying to decrease from 20 blunts day to 5 now  Vaping Use   Vaping Use: Former  Substance Use Topics   Alcohol use: Yes    Alcohol/week: 1.0 standard drink    Types: 1 Glasses of wine per week    Comment: rare - no liquor since april 2020/ drinks wine./ update 04/22/19 drinks at special occasions     Drug use: Yes    Frequency: 70.0 times per week    Types: Marijuana    Comment: 20 blunts a day now to 5 a day - trying to quit./ update 04/22/19- maybe 10 blunts a day    Home Medications Prior to Admission medications   Medication Sig Start Date End Date Taking? Authorizing Provider  metroNIDAZOLE (METROGEL VAGINAL) 0.75 % vaginal gel Place 1 Applicatorful vaginally 2 (two) times daily. 12/24/19   Brock Bad, MD  ondansetron (ZOFRAN ODT) 4 MG disintegrating tablet Take 1 tablet (4 mg total) by mouth every 8 (eight) hours as needed for nausea or vomiting. 01/31/20   Renne Crigler, PA-C  oxyCODONE (OXY IR/ROXICODONE) 5 MG immediate release tablet Take 1 tablet (5 mg total) by mouth every 6 (six) hours as needed for severe pain. 01/31/20   Renne Crigler, PA-C  nortriptyline (PAMELOR) 10 MG capsule Take 1 capsule (10 mg total) by mouth at bedtime. Patient not taking: No sig reported 04/22/19 07/14/19  Anson Fret, MD    Allergies    Patient has no known allergies.  Review of Systems   Review of Systems  All other systems reviewed and are negative. Ten systems reviewed and are negative for acute change, except as noted in the HPI.    Physical Exam Updated Vital Signs BP (!) 114/93 (BP Location: Right Arm)    Pulse (!) 103    Temp  98.4 F (36.9 C) (Oral)    Resp 15    SpO2 99%   Physical Exam Vitals and nursing note reviewed.  Constitutional:      General: She is not in acute distress.    Appearance: Normal appearance. She is well-developed and normal weight. She is not ill-appearing, toxic-appearing or diaphoretic.  HENT:     Head: Normocephalic and atraumatic.     Right Ear: External ear normal.     Left Ear: External ear normal.     Nose: Nose normal.  Mouth/Throat:     Mouth: Mucous membranes are moist.     Pharynx: Oropharynx is clear. No oropharyngeal exudate or posterior oropharyngeal erythema.  Eyes:     Extraocular Movements: Extraocular movements intact.  Cardiovascular:     Rate and Rhythm: Normal rate and regular rhythm.     Pulses: Normal pulses.     Heart sounds: Normal heart sounds. No murmur heard. No friction rub. No gallop.   Pulmonary:     Effort: Pulmonary effort is normal. No respiratory distress.     Breath sounds: Normal breath sounds. No stridor. No wheezing, rhonchi or rales.  Abdominal:     General: Abdomen is flat.     Palpations: Abdomen is soft.     Tenderness: There is generalized abdominal tenderness.     Comments: Abdomen is soft.  Mild distention.  Moderate diffuse tenderness with palpation.  Pain seems to be worst along the epigastrium and suprapubic region.  Genitourinary:    Comments: Female nursing chaperone present.  Normal-appearing vaginal mucosa.  Normal-appearing vulvar anatomy.  Not erythematous cervix.  Small amount of green/yellow discharge noted in the vaginal vault.  Positive cervical motion tenderness.  No adnexal tenderness. Musculoskeletal:        General: Normal range of motion.     Cervical back: Normal range of motion and neck supple. No tenderness.  Skin:    General: Skin is warm and dry.  Neurological:     General: No focal deficit present.     Mental Status: She is alert and oriented to person, place, and time.  Psychiatric:        Mood and  Affect: Mood normal.        Behavior: Behavior normal.     ED Results / Procedures / Treatments   Labs (all labs ordered are listed, but only abnormal results are displayed) Labs Reviewed  WET PREP, GENITAL - Abnormal; Notable for the following components:      Result Value   Clue Cells Wet Prep HPF POC PRESENT (*)    WBC, Wet Prep HPF POC MANY (*)    All other components within normal limits  COMPREHENSIVE METABOLIC PANEL - Abnormal; Notable for the following components:   Sodium 133 (*)    BUN 5 (*)    Albumin 3.3 (*)    All other components within normal limits  GC/CHLAMYDIA PROBE AMP ([place]) NOT AT [facility] - Abnormal; Notable for the following components:   Neisseria Gonorrhea Positive (*)    All other components within normal limits  LIPASE, BLOOD  CBC  I-STAT BETA HCG BLOOD, ED ([facility], [facility], [facility] ONLY)   EKG None  Radiology CT ABDOMEN PELVIS W CONTRAST  Result Date: 02/02/2020 CLINICAL DATA:  Unspecified abdominal pain, abdominal swelling EXAM: CT ABDOMEN AND PELVIS WITH CONTRAST TECHNIQUE: Multidetector CT imaging of the abdomen and pelvis was performed using the standard protocol following bolus administration of intravenous contrast. CONTRAST:  179mL OMNIPAQUE IOHEXOL 300 MG/ML  SOLN COMPARISON:  01/31/2020 FINDINGS: Lower chest: The visualized lung bases are clear bilaterally. Visualized heart and pericardium are unremarkable. Hepatobiliary: Mild to moderate hepatic steatosis. No enhancing liver lesion. No intra or extrahepatic biliary ductal dilation. Gallbladder unremarkable. Pancreas: Unremarkable Spleen: Unremarkable Adrenals/Urinary Tract: Adrenal glands are unremarkable. The kidneys are normal in size and position. Multiple simple cortical cysts are seen within the left kidney. The kidneys are otherwise unremarkable. Bladder unremarkable. Stomach/Bowel: The stomach, small bowel, and large bowel are unremarkable. No evidence of obstruction or focal  inflammation. Fluid-filled  minimally dilated loops of small bowel noted on prior examination have resolved. Appendix normal. No free intraperitoneal gas or fluid. Vascular/Lymphatic: No significant vascular findings are present. No enlarged abdominal or pelvic lymph nodes. Reproductive: Uterus and bilateral adnexa are unremarkable. Other: No abdominal wall hernia.  Rectum unremarkable. Musculoskeletal: Mild degenerative changes are noted at the lumbosacral junction. No acute bone abnormality. IMPRESSION: No radiographic explanation for the patient's reported symptoms. No acute intra-abdominal pathology identified. Mild to moderate hepatic steatosis. Electronically Signed   By: Fidela Salisbury MD   On: 02/02/2020 20:37   Procedures Procedures (including critical care time)  Medications Ordered in ED Medications  sodium chloride 0.9 % bolus 1,000 mL (0 mLs Intravenous Stopped 02/02/20 2148)  pantoprazole (PROTONIX) injection 40 mg (40 mg Intravenous Given 02/02/20 1925)  alum & mag hydroxide-simeth (MAALOX/MYLANTA) 200-200-20 MG/5ML suspension 30 mL (30 mLs Oral Given 02/02/20 1926)    And  lidocaine (XYLOCAINE) 2 % viscous mouth solution 15 mL (15 mLs Oral Given 02/02/20 1926)  morphine 4 MG/ML injection 4 mg (4 mg Intravenous Given 02/02/20 1927)  iohexol (OMNIPAQUE) 300 MG/ML solution 100 mL (100 mLs Intravenous Contrast Given 02/02/20 2001)  cefTRIAXone (ROCEPHIN) injection 500 mg (500 mg Intramuscular Given 02/03/20 0008)  morphine 2 MG/ML injection 1 mg (1 mg Intravenous Given 02/03/20 0008)   ED Course  I have reviewed the triage vital signs and the nursing notes.  Pertinent labs & imaging results that were available during my care of the patient were reviewed by me and considered in my medical decision making (see chart for details).  Clinical Course as of 02/03/20 1619  Sun Feb 02, 2020  2100 CT ABDOMEN PELVIS W CONTRAST IMPRESSION: No radiographic explanation for the patient's reported symptoms. No acute intra-abdominal  pathology identified.  Mild to moderate hepatic steatosis. [LJ]  2306 WBC, Wet Prep HPF POC(!): MANY [LJ]  2306 Clue Cells Wet Prep HPF POC(!): PRESENT [LJ]    Clinical Course User Index [LJ] Moody Bruins   MDM Rules/Calculators/A&P                          Patient is a 45 year old female who presents the emergency department with abdominal pain.  Started about 4 days ago.  Evaluated in the emergency department 2 days ago with the same complaint.  She had a CT scan of the abdomen as well as a pelvic ultrasound obtained.  She was discharged with oxycodone as well as Zofran.  She reports continued pain.  She had diffuse abdominal pain on my exam seem to be worse along the epigastrium and suprapubic region.  There was a possible ileus on prior CTA so I obtained a repeat CT today.  This was negative for any acute intra-abdominal or intrapelvic abnormalities.  Labs today are all reassuring.  Mild hyponatremia at 133.  Patient given IV normal saline.  CBC, lipase, i-STAT beta-hCG all within normal limits.  Wet prep showing white blood cells and clue cells.  GC/chlamydia pending.  Based on patient's refractory abdominal pain as well as cervical motion tenderness, will treat for pelvic inflammatory disease.  Wet prep showing multiple white blood cells as well as clue cells.  Patient given IM Rocephin here in the emergency department.  Will discharge on 14 days of doxycycline as well as Flagyl.  This should cover for both PID as well as BV.  Discussed return precautions with the patient.  Recommended PCP follow-up.  Her questions were answered and she was amicable at the time of discharge.  Final Clinical Impression(s) / ED Diagnoses Final diagnoses:  Pelvic inflammatory disease (PID)  BV (bacterial vaginosis)   Rx / DC Orders ED Discharge Orders         Ordered    metroNIDAZOLE (FLAGYL) 500 MG tablet  2 times daily        02/02/20 2325    doxycycline (VIBRAMYCIN) 100 MG capsule  2  times daily        02/02/20 2325           Placido Sou, PA-C 02/03/20 1619    Milagros Loll, MD 02/03/20 [phone]

## 2020-02-02 NOTE — ED Notes (Signed)
Pt appears comfortable talking on the phone

## 2020-02-02 NOTE — ED Notes (Signed)
The pt is angry that she is in the hallway  She continued to complain the entire time was startting her iv nothing can make her happy she cannot see the bars around the employee arguing about that  She knows she is going to get something from being in the hallway instead of a room  Was here 3 days ago for the same did not go to a hallway bed   If she gets sick from bring in the hallway she is going to sue  From the ssound of her complaint

## 2020-02-02 NOTE — ED Notes (Signed)
Pt to br her iv fluid  Has infused pt upset that the bag is not connected  I showed her that the liter ordered has infused

## 2020-02-03 LAB — GC/CHLAMYDIA PROBE AMP (~~LOC~~) NOT AT ARMC
Chlamydia: NEGATIVE
Comment: NEGATIVE
Comment: NORMAL
Neisseria Gonorrhea: POSITIVE — AB

## 2020-02-06 ENCOUNTER — Ambulatory Visit (INDEPENDENT_AMBULATORY_CARE_PROVIDER_SITE_OTHER): Payer: Medicaid Other | Admitting: Podiatry

## 2020-02-06 ENCOUNTER — Telehealth: Payer: Self-pay | Admitting: Nurse Practitioner

## 2020-02-06 ENCOUNTER — Other Ambulatory Visit: Payer: Self-pay

## 2020-02-06 DIAGNOSIS — R52 Pain, unspecified: Secondary | ICD-10-CM

## 2020-02-06 DIAGNOSIS — L905 Scar conditions and fibrosis of skin: Secondary | ICD-10-CM

## 2020-02-06 DIAGNOSIS — L859 Epidermal thickening, unspecified: Secondary | ICD-10-CM | POA: Diagnosis not present

## 2020-02-06 NOTE — Patient Instructions (Addendum)
Look for urea 40% cream or ointment and apply to the thickened dry skin / calluses. This can be bought over the counter, at a pharmacy or online such as [employer].   Apply daily after showering and allow to dry. Use a pumice stone daily

## 2020-02-06 NOTE — Telephone Encounter (Signed)
Spoke to patient this morning to advise her on recent CT abd/pelvis findings. Patient has a follow up appointment with [facility] on 02/20/20 at 11 am. She will contact our office sooner if her symptoms worsen or for any questions or concerns. All questions answered. Patient voiced understanding.

## 2020-02-09 NOTE — Progress Notes (Signed)
  Subjective:  [name], female    DOB: April 21, 1975,  MRN: [id number]  Chief Complaint  Patient presents with  . Foot Pain    Left foot painful spot on the lateral side of the foot     45 y.o. female presents with the above complaint. History confirmed with patient. She has a previous history of multiple foot surgeries, she had Tailor's bunionectomy as well as subsequent excision of a plantar benign skin lesion with 5th metatarsal head resection, she developed a painful scar underneath. Currently does not apply anything on it other than OTC lotion  Objective:  Physical Exam: warm, good capillary refill, no trophic changes or ulcerative lesions, normal DP and PT pulses and normal sensory exam. Left Foot: submetatarsal 5 there is a hyperkeratotic area, appears consistent with hyperkeratosis, no evidence of a hypertrophic or keloid scar here   Radiographs: X-ray of the left foot: status post 5th metatarsal head resection Assessment:   1. Painful scar   2. Hyperkeratosis      Plan:  Patient was evaluated and treated and all questions answered.  Unfortunately this is a difficult lesion in a painful area. There is no evidence of true hypertrophic scar or keloid formation. I do not think corticosteroid injection will help much for this. I also discussed revision surgery with her and what this would entail. I think the likelihood that this would results in a similar outcome is high. I recommend palliative care with intermittent debridement, an aggressive at home regimen with use of a pumice stone and a keratolytic agent such as salicylic acid > 67% or urea > 40% would be beneficial with daily use to soften the lesion.   Return if symptoms worsen or fail to improve.

## 2020-02-20 ENCOUNTER — Encounter: Payer: Self-pay | Admitting: Nurse Practitioner

## 2020-02-20 ENCOUNTER — Other Ambulatory Visit (INDEPENDENT_AMBULATORY_CARE_PROVIDER_SITE_OTHER): Payer: Medicaid Other

## 2020-02-20 ENCOUNTER — Ambulatory Visit: Payer: Medicaid Other | Admitting: Nurse Practitioner

## 2020-02-20 ENCOUNTER — Other Ambulatory Visit: Payer: Self-pay

## 2020-02-20 VITALS — BP 88/52 | HR 60 | Ht 62.0 in | Wt 129.0 lb

## 2020-02-20 DIAGNOSIS — R1084 Generalized abdominal pain: Secondary | ICD-10-CM

## 2020-02-20 DIAGNOSIS — R194 Change in bowel habit: Secondary | ICD-10-CM

## 2020-02-20 LAB — BASIC METABOLIC PANEL
BUN: 6 mg/dL (ref 6–23)
CO2: 28 mEq/L (ref 19–32)
Calcium: 9.2 mg/dL (ref 8.4–10.5)
Chloride: 106 mEq/L (ref 96–112)
Creatinine, Ser: 0.68 mg/dL (ref 0.40–1.20)
GFR: 106.92 mL/min (ref >60.00)
Glucose, Bld: 74 mg/dL (ref 70–99)
Potassium: 3.7 mEq/L (ref 3.5–5.1)
Sodium: 137 mEq/L (ref 135–145)

## 2020-02-20 LAB — C-REACTIVE PROTEIN: CRP: <1 mg/dL (ref 0.5–20.0)

## 2020-02-20 MED ORDER — DICYCLOMINE HCL 10 MG PO CAPS: 10.0000 mg | ORAL_CAPSULE | Freq: Two times a day (BID) | ORAL | 0 refills | Status: DC | PRN

## 2020-02-20 NOTE — Patient Instructions (Signed)
If you are age 44 or older, your body mass index should be between 23-30. Your Body mass index is 23.59 kg/m. If this is out of the aforementioned range listed, please consider follow up with your Primary Care Provider.  If you are age 44 or younger, your body mass index should be between 19-25. Your Body mass index is 23.59 kg/m. If this is out of the aformentioned range listed, please consider follow up with your Primary Care Provider.    LABS: Your provider has requested that you go to the basement level for lab work before leaving today. Press "B" on the elevator. The lab is located at the first door on the left as you exit the elevator.  HEALTHCARE LAWS AND MY CHART RESULTS: Due to recent changes in healthcare laws, you may see the results of your imaging and laboratory studies on MyChart before your provider has had a chance to review them.  We understand that in some cases there may be results that are confusing or concerning to you. Not all laboratory results come back in the same time frame and the provider may be waiting for multiple results in order to interpret others.  Please give [place] 48 hours in order for your provider to thoroughly review all the results before contacting the office for clarification of your results.   MEDICATION  We have sent the following medication to your pharmacy for you to pick up at your convenience:     Dicyclomine 10 MG, take 1 tablet twice a day before meals as needed.  We have given you samples of the following medication to take: Probiotic, take 1 a day.  Please increase water intake Avoid fatty foods Follow up with gyn regarding pelvic infection   Please call our clinic for a follow up with Dr. Silverio Decamp in 6 weeks.  It was great seeing you today!  Thank you for entrusting me with your care and choosing [facility].  Noralyn Pick, CRNP

## 2020-02-20 NOTE — Progress Notes (Signed)
02/20/2020 Catherine Osborne [id number] [birth year]   Chief Complaint: Catherine Osborne   History of Present Illness:  Catherine Osborne is a 45 year old female with a past medical history of asthma, headaches, polysubstance abuse, bilateral lung nodules and lactose intolerance.    I last saw her in the office on 08/16/2019 due to having stomach pain and diarrhea.  A GI pathogen panel completed 09/03/2019 was negative.  She was advised to reduce her caffeine intake and to avoid fatty foods and her symptoms eventually improved.    She presents to our office today for further evaluation for lower abdominal pain and constipation.  She presented to the ED on 01/31/2020 with lower abdominal/pelvic pain.  An abdominal/pelvic CT contra scan showed questionable haustral thickening of the transverse colon raising the possibility of mild colitis, fluid-filled small bowel loops possibly reflecting mild ileus or sequela of gastroenteritis.  Bendix was normal.  A pelvic ultrasound showed multiple small uterine fibroids otherwise was normal.  Labs showed a WBC 8.7.  Hemoglobin 13.8.  CMP was normal.  Lipase 32.  She was discharged home on Oxycodone 5 mg every 6 hours as needed.  She reported taking the Oxycodone every 6 hours which resulted in constipation.  Lower abdominal/pelvic pain worsened and she presented back to [facility] ED on 02/02/2020.  At that time, she reported going 4 days without passing a BM.  Labs showed a sodium level 133.  Normal LFTs.  WBC 9.3.  Hemoglobin 14.6.  A repeat CTAP with contrast was negative, the stomach, small bowel and large bowel were unremarkable.  The prior fluid-filled minimally dilated loops of small bowel resolved.  No evidence of colitis.  Mild to moderate hepatic steatosis was noted.  A pelvic wet prep test showed WBCs and clue cells concerning for pelvic inflammatory disease.  She received Rocephin IM and she was discharged on Doxycycline 100 mg twice daily for 14 days  and Flagyl 500 mg twice daily for 14 days to cover PID and bacterial vaginosis.  Currently, she describes feeling constipated but she is passing a small formed bowel movement 3-4 times daily after eating. She describes feeling as if stool is slower to pass through the colon and it takes much effort and time to pass a BM.  She has less awareness, less noticeable urge to defecate she denies taking any further narcotics.  Her lower abdominal pain has significantly reduced but has not completely abated.  No fever.  No rectal bleeding or melena.  No mucus per the rectum she denies recent alcohol or drug use.  He is avoiding red meat.  She has increased her vegetable intake.  She dislikes water but she is drinking Gatorade and fruit juices.  History of colon polyps. She underwent a colonoscopy colonoscopy July 01, 2016 which identified 2 sessile polyps(1 of the polyps was not retrieved, and the other was hyperplastic)removed from transverse colon and rectum, internal hemorrhoids.  No family history of IBD or colorectal cancer.  CTAP 02/02/2020: No radiographic explanation for the patient's reported symptoms. No acute intra-abdominal pathology identified. Mild to moderate hepatic steatosis.  CTAP 01/31/2020: 1. Question of haustral thickening the transverse colon raising the possibility of mild colitis. 2. Fluid-filled small bowel loops may reflect mild ileus or sequela of gastroenteritis. 3. Normal appendix 4. Small low-density lesion in the lower pole the LEFT kidney stable since 2018 but with density values that are indeterminate. Small hemorrhagic cyst is favored. Indolent renal neoplasm is  a differential consideration. Consider nonemergent follow-up CT or MRI with and without contrast with renal protocol for further evaluation  Current Medications, Allergies, Past Medical History, Past Surgical History, Family History and Social History were reviewed in [employer]  record.   Review of Systems:   Constitutional: Negative for fever, sweats, chills or weight loss.  Respiratory: Negative for shortness of breath.   Cardiovascular: Negative for chest pain, palpitations and leg swelling.  Gastrointestinal: See HPI.  Musculoskeletal: Negative for back pain or muscle aches.  Neurological: Negative for dizziness, headaches or paresthesias.    Physical Exam: BP (!) 88/52   Pulse 60   Ht 5\' 2"  (1.575 m)   Wt 129 lb (58.5 kg)   BMI 23.59 kg/m  General: 45 year old female in no acute distress. Head: Normocephalic and atraumatic. Eyes: No scleral icterus. Conjunctiva pink . Ears: Normal auditory acuity. Mouth: Dentition intact. No ulcers or lesions.  Lungs: Clear throughout to auscultation. Heart: Regular rate and rhythm, no murmur. Abdomen: Soft, nontender and nondistended. No masses or hepatomegaly. Normal bowel sounds x 4 quadrants.  Rectal: Deferred.  Musculoskeletal: Symmetrical with no gross deformities. Extremities: No edema. Neurological: Alert oriented x 4. No focal deficits.  Psychological: Alert and cooperative. Normal mood and affect  Assessment and Recommendations: 31.  45 year old female with altered bowel pattern, constipation triggered after taking narcotics for lower abdominal/pelvic pain.  CTAP 01/31/2020 showed questionable haustral thickening to the transverse colon suggestive of possible mild colitis and fluid-filled small bowel loops suggestive of mild ileus or sequela of gastroenteritis.  A repeat CTAP 02/02/2020 showed a normal colon and small bowel.  A wet prep done in the ED was suggestive of PID and BV.  She was prescribed Doxycycline and Flagyl for 2 weeks. -CRP, BMP. -Follow up with gyn -Avoid narcotics -Dicyclomine 10 mg p.o. twice daily as needed, stop if constipation develops -MiraLAX as needed -Increase dietary fiber intake, avoid fatty/fried foods. Increase water intake -Restora probiotic once daily -Follow up in  office with 6 weeks -Patient to call office if symptoms worsen

## 2020-02-27 ENCOUNTER — Ambulatory Visit: Payer: Medicaid Other | Admitting: Obstetrics

## 2020-02-27 ENCOUNTER — Other Ambulatory Visit: Payer: Self-pay

## 2020-02-27 ENCOUNTER — Encounter: Payer: Self-pay | Admitting: Obstetrics

## 2020-02-27 VITALS — BP 108/72 | HR 75 | Ht 62.0 in | Wt 128.1 lb

## 2020-02-27 DIAGNOSIS — N730 Acute parametritis and pelvic cellulitis: Secondary | ICD-10-CM | POA: Diagnosis not present

## 2020-02-27 DIAGNOSIS — L309 Dermatitis, unspecified: Secondary | ICD-10-CM | POA: Diagnosis not present

## 2020-02-27 MED ORDER — IBUPROFEN 800 MG PO TABS: 800.0000 mg | ORAL_TABLET | Freq: Three times a day (TID) | ORAL | 5 refills | Status: AC | PRN

## 2020-02-27 NOTE — Progress Notes (Signed)
Pt is in the office to follow up after ED visit on 02/02/2020. Wants to discuss risk factors for cervical cancer in relation to recent positive GC on 02/02/2020 Last pap 08/15/2019

## 2020-02-27 NOTE — Progress Notes (Signed)
[name], female   DOB: [birth year], 45 y.o.   MRN: [id number]  Chief Complaint  Patient presents with  . Follow-up    HPI Catherine Osborne is a 45 y.o. female.  Presents for follow-up after treatment for GC infection / PID. HPI  Past Medical History:  Diagnosis Date  . Allergy   . Asthma   . Headache   . 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 Migraines   . Pneumonia   . Prior miscarriage with pregnancy in first trimester, antepartum   . Vaginal Pap smear, abnormal     Past Surgical History:  Procedure Laterality Date  . CERVICAL CERCLAGE N/A 02/26/2015   Procedure: CERCLAGE CERVICAL;  Surgeon: Frederico Hamman, MD;  Location: [facility];  Service: Gynecology;  Laterality: N/A;  . DILATION AND CURETTAGE OF UTERUS    . DILATION AND EVACUATION N/A 05/22/2012   Procedure: DILATATION AND EVACUATION;  Surgeon: Frederico Hamman, MD;  Location: [facility];  Service: Gynecology;  Laterality: N/A;  . FOOT SURGERY      Family History  Adopted: Yes  Problem Relation Age of Onset  . Bone cancer Mother   . Breast cancer Maternal Grandmother   . Diabetes Other   . Hypertension Other   . Stroke Other   . Heart attack Other     Social History Social History   Tobacco Use  . Smoking status: Former Smoker    Packs/day: 0.75    Years: 20.00    Pack years: 15.00    Types: Cigarettes    Quit date: 01/31/2018    Years since quitting: 2.0  . Smokeless tobacco: Never Used  . Tobacco comment: marijuana trying to decrease from 20 blunts day to 5 now  Vaping Use  . Vaping Use: Former  Substance Use Topics  . Alcohol use: Yes    Alcohol/week: 1.0 standard drink    Types: 1 Glasses of wine per week    Comment: rare - no liquor since april 2020/ drinks wine./ update 04/22/19 drinks at special occasions    . Drug use: Yes    Frequency: 70.0 times per week    Types: Marijuana    Comment: 20 blunts a day now to 5 a day - trying to quit./ update 04/22/19- maybe 10 blunts a day     No Known Allergies  Current Outpatient Medications  Medication Sig Dispense Refill  . dicyclomine (BENTYL) 10 MG capsule Take 1 capsule (10 mg total) by mouth 2 (two) times daily as needed for spasms (before meals). 30 capsule 0  . ibuprofen (ADVIL) 800 MG tablet Take 1 tablet (800 mg total) by mouth every 8 (eight) hours as needed. 30 tablet 5   No current facility-administered medications for this visit.    Review of Systems Review of Systems Constitutional: negative for fatigue and weight loss Respiratory: negative for cough and wheezing Cardiovascular: negative for chest pain, fatigue and palpitations Gastrointestinal: negative for abdominal pain and change in bowel habits Genitourinary: positive for pelvic pain Integument/breast: negative for nipple discharge Musculoskeletal:negative for myalgias Neurological: negative for gait problems and tremors Behavioral/Psych: negative for abusive relationship, depression Endocrine: negative for temperature intolerance      Blood pressure 108/72, pulse 75, height 5\' 2"  (1.575 m), weight 128 lb 1.6 oz (58.1 kg), last menstrual period 02/23/2020.  Physical Exam Physical Exam:  Deferred  >50% of 15 min visit spent on counseling and coordination of care.   Data Reviewed Wet Prep  and Cultures  Assessment     1. PID (acute pelvic inflammatory disease) Rx: - ibuprofen (ADVIL) 800 MG tablet; Take 1 tablet (800 mg total) by mouth every 8 (eight) hours as needed.  Dispense: 30 tablet; Refill: 5  2. Dermatitis Rx; - Ambulatory referral to Dermatology    Plan   Follow up in 6 months for Annual / Pap GC TOC cultures in 3 months  Orders Placed This Encounter  Procedures  . Ambulatory referral to Dermatology    Referral Priority:   Routine    Referral Type:   Consultation    Referral Reason:   Specialty Services Required    Requested Specialty:   Dermatology    Number of Visits Requested:   1   Meds ordered this encounter   Medications  . ibuprofen (ADVIL) 800 MG tablet    Sig: Take 1 tablet (800 mg total) by mouth every 8 (eight) hours as needed.    Dispense:  30 tablet    Refill:  5     Shelly Bombard, MD 02/27/2020 9:26 AM

## 2020-03-18 ENCOUNTER — Other Ambulatory Visit: Payer: Self-pay

## 2020-03-18 ENCOUNTER — Ambulatory Visit: Payer: Self-pay | Admitting: *Deleted

## 2020-03-18 ENCOUNTER — Encounter (HOSPITAL_COMMUNITY): Payer: Self-pay

## 2020-03-18 ENCOUNTER — Ambulatory Visit (HOSPITAL_COMMUNITY)
Admission: EM | Admit: 2020-03-18 | Discharge: 2020-03-18 | Disposition: A | Discharge status: Home / Self Care | Payer: Medicaid Other | Attending: Family Medicine | Admitting: Family Medicine

## 2020-03-18 DIAGNOSIS — U071 COVID-19: Secondary | ICD-10-CM | POA: Diagnosis not present

## 2020-03-18 DIAGNOSIS — R52 Pain, unspecified: Secondary | ICD-10-CM | POA: Diagnosis present

## 2020-03-18 DIAGNOSIS — R059 Cough, unspecified: Secondary | ICD-10-CM | POA: Insufficient documentation

## 2020-03-18 DIAGNOSIS — Z8616 Personal history of COVID-19: Secondary | ICD-10-CM

## 2020-03-18 DIAGNOSIS — N898 Other specified noninflammatory disorders of vagina: Secondary | ICD-10-CM | POA: Diagnosis not present

## 2020-03-18 DIAGNOSIS — R509 Fever, unspecified: Secondary | ICD-10-CM | POA: Diagnosis not present

## 2020-03-18 HISTORY — DX: Personal history of COVID-19: Z86.16

## 2020-03-18 LAB — POC INFLUENZA A AND B ANTIGEN (URGENT CARE ONLY)
Influenza A Ag: NEGATIVE
Influenza B Ag: NEGATIVE

## 2020-03-18 MED ORDER — KETOROLAC TROMETHAMINE 60 MG/2ML IM SOLN
INTRAMUSCULAR | Status: AC
  Filled 2020-03-18: qty 2

## 2020-03-18 MED ORDER — KETOROLAC TROMETHAMINE 60 MG/2ML IM SOLN
60.0000 mg | Freq: Once | INTRAMUSCULAR | Status: AC
Start: 2020-03-18 — End: 2020-03-18
  Administered 2020-03-18: 60 mg via INTRAMUSCULAR

## 2020-03-18 NOTE — ED Provider Notes (Signed)
[facility]    CSN: [id number] Arrival date & time: 03/18/20  1042      History   Chief Complaint Chief Complaint  Patient presents with  . Headache  . Fever  . Chills    HPI Catherine Osborne is a 45 y.o. female.   Here today with headache, fever, and chills the past 1-2 days. Has headaches and body aches since last night and states it feels like she was "hit by a bus". Denies CP, SOB, N/V/D, abdominal pain, recent sick contacts. Does state she was recently treated for multiple STIs and PID - completed course and no new partners but does want to be re-checked for these. Has some ongoing white discharge, no pelvic pain.      Past Medical History:  Diagnosis Date  . Allergy   . Asthma   . Headache   . 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 Migraines   . Pneumonia   . Prior miscarriage with pregnancy in first trimester, antepartum   . Vaginal Pap smear, abnormal     Patient Active Problem List   Diagnosis Date Noted  . Dysuria 11/28/2019  . Vaginal discharge 11/28/2019  . Marijuana use 08/21/2019  . Anxiety 08/21/2019  . COVID-19 virus detected 03/07/2019  . Personal history of multiple sclerosis ([place]) 08/21/2018  . Persistent depressive disorder with anxious distress, currently moderate 08/21/2018  . Shortness of breath 08/21/2018  . 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 Pulmonary nodules 08/21/2018  . Substance abuse ([place]) 08/21/2018  . Cocaine abuse ([place]) 08/21/2018  . Bronchitis, acute 04/03/2018  . Seasonal allergies 01/18/2016  . Compliance poor 01/18/2016  . Asthma 12/07/2015  . Goiter diffuse 12/07/2015  . NSVD (normal spontaneous vaginal delivery) 06/13/2015  . Pregnancy induced hypertension 06/12/2015  . Preeclampsia 06/10/2015  . Short cervix affecting pregnancy 02/25/2015  . Advanced maternal age in multigravida 02/12/2015  . Left thyroid nodule 10/25/2011    Past Surgical History:  Procedure Laterality Date  . CERVICAL CERCLAGE N/A 02/26/2015    Procedure: CERCLAGE CERVICAL;  Surgeon: Frederico Hamman, MD;  Location: [facility];  Service: Gynecology;  Laterality: N/A;  . DILATION AND CURETTAGE OF UTERUS    . DILATION AND EVACUATION N/A 05/22/2012   Procedure: DILATATION AND EVACUATION;  Surgeon: Frederico Hamman, MD;  Location: [facility];  Service: Gynecology;  Laterality: N/A;  . FOOT SURGERY      OB History    Gravida  14   Para  2   Term  1   Preterm  1   AB  12   Living  1     SAB  10   IAB  2   Ectopic      Multiple  0   Live Births  1            Home Medications    Prior to Admission medications   Medication Sig Start Date End Date Taking? Authorizing Provider  dicyclomine (BENTYL) 10 MG capsule Take 1 capsule (10 mg total) by mouth 2 (two) times daily as needed for spasms (before meals). 02/20/20   Noralyn Pick, NP  ibuprofen (ADVIL) 800 MG tablet Take 1 tablet (800 mg total) by mouth every 8 (eight) hours as needed. 02/27/20   Shelly Bombard, MD  nortriptyline (PAMELOR) 10 MG capsule Take 1 capsule (10 mg total) by mouth at bedtime. Patient not taking: No sig reported 04/22/19 07/14/19  Melvenia Beam, MD    Family History  Family History  Adopted: Yes  Problem Relation Age of Onset  . Bone cancer Mother   . Breast cancer Maternal Grandmother   . Diabetes Other   . Hypertension Other   . Stroke Other   . Heart attack Other     Social History Social History   Tobacco Use  . Smoking status: Former Smoker    Packs/day: 0.75    Years: 20.00    Pack years: 15.00    Types: Cigarettes    Quit date: 01/31/2018    Years since quitting: 2.1  . Smokeless tobacco: Never Used  . Tobacco comment: marijuana trying to decrease from 20 blunts day to 5 now  Vaping Use  . Vaping Use: Former  Substance Use Topics  . Alcohol use: Yes    Alcohol/week: 1.0 standard drink    Types: 1 Glasses of wine per week    Comment: rare - no liquor since april 2020/ drinks wine./ update 04/22/19 drinks  at special occasions    . Drug use: Yes    Frequency: 70.0 times per week    Types: Marijuana    Comment: 20 blunts a day now to 5 a day - trying to quit./ update 04/22/19- maybe 10 blunts a day     Allergies   Patient has no known allergies.   Review of Systems Review of Systems PER HPI    Physical Exam Triage Vital Signs ED Triage Vitals  Enc Vitals Group     BP 03/18/20 1133 111/76     Pulse Rate 03/18/20 1133 77     Resp 03/18/20 1133 18     Temp 03/18/20 1133 (!) 101 F (38.3 C)     Temp Source 03/18/20 1133 Oral     SpO2 03/18/20 1133 100 %     Weight --      Height --      Head Circumference --      Peak Flow --      Pain Score 03/18/20 1132 10     Pain Loc --      Pain Edu? --      Excl. in [place]? --    No data found.  Updated Vital Signs BP 111/76 (BP Location: Right Arm)   Pulse 77   Temp (!) 101 F (38.3 C) (Oral)   Resp 18   LMP 02/23/2020   SpO2 100%   Visual Acuity Right Eye Distance:   Left Eye Distance:   Bilateral Distance:    Right Eye Near:   Left Eye Near:    Bilateral Near:     Physical Exam Vitals and nursing note reviewed.  Constitutional:      General: She is not in acute distress.    Appearance: She is ill-appearing. She is not diaphoretic.     Comments: Asleep on exam room table, had to gently shake patient to awaken  HENT:     Head: Atraumatic.     Mouth/Throat:     Mouth: Mucous membranes are moist.     Pharynx: Oropharynx is clear.  Eyes:     Extraocular Movements: Extraocular movements intact.     Conjunctiva/sclera: Conjunctivae normal.  Cardiovascular:     Rate and Rhythm: Normal rate and regular rhythm.     Heart sounds: Normal heart sounds.  Pulmonary:     Effort: Pulmonary effort is normal. No respiratory distress.     Breath sounds: Normal breath sounds. No wheezing or rales.  Abdominal:  General: Bowel sounds are normal. There is no distension.     Palpations: Abdomen is soft.     Tenderness: There is  no abdominal tenderness. There is no guarding.  Genitourinary:    Comments: GU exam deferred, self swab performed Musculoskeletal:        General: Normal range of motion.     Cervical back: Normal range of motion and neck supple.  Skin:    General: Skin is warm and dry.  Neurological:     Mental Status: She is alert and oriented to person, place, and time.  Psychiatric:        Mood and Affect: Mood normal.        Thought Content: Thought content normal.        Judgment: Judgment normal.      UC Treatments / Results  Labs (all labs ordered are listed, but only abnormal results are displayed) Labs Reviewed  SARS CORONAVIRUS 2 (TAT 6-24 HRS)  POC INFLUENZA A AND B ANTIGEN (URGENT CARE ONLY)  CERVICOVAGINAL ANCILLARY ONLY    EKG   Radiology No results found.  Procedures Procedures (including critical care time)  Medications Ordered in UC Medications  ketorolac (TORADOL) injection 60 mg (60 mg Intramuscular Given 03/18/20 1330)    Initial Impression / Assessment and Plan / UC Course  I have reviewed the triage vital signs and the nursing notes.  Pertinent labs & imaging results that were available during my care of the patient were reviewed by me and considered in my medical decision making (see chart for details).     Febrile, otherwise vital signs reassuring today. She appears ill but lungs CTAB, heart sounds benign, abdominal exam benign. Rapid flu neg, COVID pcr pending. Repeat aptima swab also pending per her request. Suspect viral illness. Discussed OTC pain relievers/fever reducers, rest, fluids. Strict return precautions reviewed for worsening sxs.   Final Clinical Impressions(s) / UC Diagnoses   Final diagnoses:  Fever, unspecified fever cause  Generalized body aches  Vaginal discharge  Cough   Discharge Instructions   None    ED Prescriptions    None     PDMP not reviewed this encounter.   Volney American, [place] 03/18/20 [phone]

## 2020-03-18 NOTE — Telephone Encounter (Signed)
Pt evasive historian. Reports headache and blurred vision "Only at night with lights because my eye glass prescription is not right." States she cannot get new glasses with Medicaid. States she works at [employer] a lot of lights." In course of assessment pt states she also thinks Vit D level is low, "I get spasms." States spasms "Entire left side of body." Does not occur daily. "Sometimes on my right side." States this occurs when Vit D level is low.  Per agent next available appt 03/24/20. Pt would like Vit D level checked and assistance with eyeglass prescription.  Please advise for final disposition.  Reason for Disposition . [1] MILD-MODERATE headache AND [2] present > 72 hours  Answer Assessment - Initial Assessment Questions 1. LOCATION: "Where does it hurt?"      "From my eyes" 2. ONSET: "When did the headache start?" (Minutes, hours or days)      *No Answer* 3. PATTERN: "Does the pain come and go, or has it been constant since it started?"     *No Answer* 4. SEVERITY: "How bad is the pain?" and "What does it keep you from doing?"  (e.g., Scale 1-10; mild, moderate, or severe)   - MILD (1-3): doesn't interfere with normal activities    - MODERATE (4-7): interferes with normal activities or awakens from sleep    - SEVERE (8-10): excruciating pain, unable to do any normal activities        "Only at night" 5. RECURRENT SYMPTOM: "Have you ever had headaches before?" If Yes, ask: "When was the last time?" and "What happened that time?"      *No Answer* 6. CAUSE: "What do you think is causing the headache?"     Glasses 7. MIGRAINE: "Have you been diagnosed with migraine headaches?" If Yes, ask: "Is this headache similar?"      *No Answer* 8. HEAD INJURY: "Has there been any recent injury to the head?"      *No Answer* 9. OTHER SYMPTOMS: "Do you have any other symptoms?" (fever, stiff neck, eye pain, sore throat, cold symptoms)     "Glasses are not right"*  Please see triage  summary  Protocols used: HEADACHE-A-AH

## 2020-03-18 NOTE — ED Triage Notes (Signed)
Pt presents with headaches, fever and chills X 2 years. She states she has been seen for these sxs before.  Pt states during the night she wakes up feeling hot. She states she wakes up with headaches. Pt states she has been having body aches x last night.

## 2020-03-18 NOTE — ED Notes (Signed)
Pt asked if she would like COVID test, states she knows she does not have COVID and has had it before. And did not feel the same way. Pt states she has been drinking Coffee all morning and could taste it.

## 2020-03-19 LAB — CERVICOVAGINAL ANCILLARY ONLY
Bacterial Vaginitis (gardnerella): NEGATIVE
Candida Glabrata: NEGATIVE
Candida Vaginitis: NEGATIVE
Chlamydia: NEGATIVE
Comment: NEGATIVE
Comment: NEGATIVE
Comment: NEGATIVE
Comment: NEGATIVE
Comment: NEGATIVE
Comment: NORMAL
Neisseria Gonorrhea: NEGATIVE
Trichomonas: NEGATIVE

## 2020-03-19 LAB — SARS CORONAVIRUS 2 (TAT 6-24 HRS): SARS Coronavirus 2: POSITIVE — AB

## 2020-03-19 NOTE — Telephone Encounter (Signed)
Please schedule pt an appointment  

## 2020-03-20 ENCOUNTER — Other Ambulatory Visit: Payer: Self-pay | Admitting: Physician Assistant

## 2020-03-20 ENCOUNTER — Telehealth (HOSPITAL_COMMUNITY): Payer: Self-pay | Admitting: Pharmacist

## 2020-03-20 DIAGNOSIS — J4520 Mild intermittent asthma, uncomplicated: Secondary | ICD-10-CM

## 2020-03-20 DIAGNOSIS — F1910 Other psychoactive substance abuse, uncomplicated: Secondary | ICD-10-CM

## 2020-03-20 DIAGNOSIS — U071 COVID-19: Secondary | ICD-10-CM

## 2020-03-20 MED ORDER — NIRMATRELVIR/RITONAVIR (PAXLOVID)TABLET: 3.0000 | ORAL_TABLET | Freq: Two times a day (BID) | ORAL | 0 refills | Status: AC

## 2020-03-20 MED ORDER — NIRMATRELVIR/RITONAVIR (PAXLOVID)TABLET: 3.0000 | ORAL_TABLET | Freq: Two times a day (BID) | ORAL | 0 refills | Status: DC

## 2020-03-20 MED FILL — PAXLOVID 20 X 150 MG & 10 X: 20 X 150 MG | 5 days supply | Qty: 30 | Fill #0

## 2020-03-20 NOTE — Telephone Encounter (Signed)
Called patient and left voicemail to schedule appt with any available provider. Advised patient to call [phone].

## 2020-03-20 NOTE — Telephone Encounter (Signed)
Patient was prescribed oral covid treatment Paxlovid and treatment note was reviewed. Medication has been received by [place] and reviewed for appropriateness.  Drug Interactions or Dosage Adjustments Noted: Breo Ellipta hold while on Paxlovid. Patient has ProAir MDI & albuterol nebs.  Patient counseled to not use nebs around her 2 children so as to not aerosolize COVID  Delivery Method: PICKUP  Patient contacted for counseling on telephone and verbalized understanding.   Delivery or Pick-Up Date: 03/20/2020   Catherine Osborne 03/20/2020, 12:45 PM [facility] Outpatient Pharmacist Phone# [phone]

## 2020-03-20 NOTE — Progress Notes (Signed)
Outpatient Oral COVID Treatment Note  I connected with Catherine Osborne on 03/20/2020/10:14 AM by telephone and verified that I am speaking with the correct person using two identifiers.  I discussed the limitations, risks, security, and privacy concerns of performing an evaluation and management service by telephone and the availability of in person appointments. I also discussed with the patient that there may be a patient responsible charge related to this service. The patient expressed understanding and agreed to proceed.  Patient location: home  Provider location: office  Diagnosis: COVID-19 infection  Purpose of visit: Discussion of potential use of Molnupiravir or Paxlovid, a new treatment for mild to moderate COVID-19 viral infection in non-hospitalized patients.   Subjective: Patient is a 45 y.o. female who has been diagnosed with COVID 19 viral infection.  Their symptoms began on 2/15 with body aches and chills.  She is not vaccinated but does not want an infusion (offered MAB).    Past Medical History:  Diagnosis Date  . Allergy   . Asthma   . Headache   . 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 Migraines   . Pneumonia   . Prior miscarriage with pregnancy in first trimester, antepartum   . Vaginal Pap smear, abnormal     No Known Allergies   Current Outpatient Medications:  .  dicyclomine (BENTYL) 10 MG capsule, Take 1 capsule (10 mg total) by mouth 2 (two) times daily as needed for spasms (before meals)., Disp: 30 capsule, Rfl: 0 .  ibuprofen (ADVIL) 800 MG tablet, Take 1 tablet (800 mg total) by mouth every 8 (eight) hours as needed., Disp: 30 tablet, Rfl: 5  Objective: Patient sounds okay on phone  They are in no apparent distress.  Breathing is non labored.  Mood and behavior are normal.  Laboratory Data:  Recent Results (from the past 2160 hour(s))  Cervicovaginal ancillary only( [place])     Status: Abnormal   Collection Time: 12/23/19  4:37 PM  Result Value Ref  Range   Neisseria Gonorrhea Negative    Chlamydia Negative    Trichomonas Negative    Bacterial Vaginitis (gardnerella) Positive (A)    Candida Vaginitis Negative    Candida Glabrata Negative    Comment      Normal Reference Range Bacterial Vaginosis - Negative   Comment Normal Reference Range Candida Species - Negative    Comment Normal Reference Range Candida Galbrata - Negative    Comment Normal Reference Range Trichomonas - Negative    Comment Normal Reference Ranger Chlamydia - Negative    Comment      Normal Reference Range Neisseria Gonorrhea - Negative  Hepatitis B surface antigen     Status: None   Collection Time: 12/23/19  4:38 PM  Result Value Ref Range   Hepatitis B Surface Ag Negative Negative  Hepatitis C antibody     Status: None   Collection Time: 12/23/19  4:38 PM  Result Value Ref Range   Hep C Virus Ab <0.1 0.0 - 0.9 s/co ratio    Comment:                                   Negative:     < 0.8                              Indeterminate: 0.8 - 0.9  Positive:     > 0.9  The CDC recommends that a positive HCV antibody result  be followed up with a HCV Nucleic Acid Amplification  test (469629).   HIV Antibody (routine testing w rflx)     Status: None   Collection Time: 12/23/19  4:38 PM  Result Value Ref Range   HIV Screen 4th Generation wRfx Non Reactive Non Reactive  RPR     Status: None   Collection Time: 12/23/19  4:38 PM  Result Value Ref Range   RPR Ser Ql Non Reactive Non Reactive  Urinalysis, Routine w reflex microscopic Urine, Clean Catch     Status: Abnormal   Collection Time: 01/31/20  3:00 AM  Result Value Ref Range   Color, Urine YELLOW YELLOW   APPearance CLOUDY (A) CLEAR   Specific Gravity, Urine 1.018 1.005 - 1.030   pH 8.0 5.0 - 8.0   Glucose, UA NEGATIVE NEGATIVE mg/dL   Hgb urine dipstick NEGATIVE NEGATIVE   Bilirubin Urine NEGATIVE NEGATIVE   Ketones, ur NEGATIVE NEGATIVE mg/dL   Protein, ur NEGATIVE  NEGATIVE mg/dL   Nitrite NEGATIVE NEGATIVE   Leukocytes,Ua MODERATE (A) NEGATIVE   RBC / HPF 0-5 0 - 5 RBC/hpf   Bacteria, UA RARE (A) NONE SEEN   Squamous Epithelial / LPF 0-5 0 - 5    Comment: Performed at [facility], [address], [place], [place] [address]  Lipase, blood     Status: None   Collection Time: 01/31/20  3:07 AM  Result Value Ref Range   Lipase 32 11 - 51 U/L    Comment: Performed at [facility] [address], [place], [place] [address]  Comprehensive metabolic panel     Status: Abnormal   Collection Time: 01/31/20  3:07 AM  Result Value Ref Range   Sodium 138 135 - 145 mmol/L   Potassium 4.2 3.5 - 5.1 mmol/L   Chloride 108 98 - 111 mmol/L   CO2 18 (L) 22 - 32 mmol/L   Glucose, Bld 96 70 - 99 mg/dL    Comment: Glucose reference range applies only to samples taken after fasting for at least 8 hours.   BUN 11 6 - 20 mg/dL   Creatinine, Ser 0.74 0.44 - 1.00 mg/dL   Calcium 8.9 8.9 - 10.3 mg/dL   Total Protein 6.2 (L) 6.5 - 8.1 g/dL   Albumin 3.5 3.5 - 5.0 g/dL   AST 18 15 - 41 U/L   ALT 18 0 - 44 U/L   Alkaline Phosphatase 48 38 - 126 U/L   Total Bilirubin 0.7 0.3 - 1.2 mg/dL   GFR, Estimated >60 >60 mL/min    Comment: (NOTE) Calculated using the CKD-EPI Creatinine Equation (2021)    Anion gap 12 5 - 15    Comment: Performed at [facility] [address] [place] [address]  CBC     Status: None   Collection Time: 01/31/20  3:07 AM  Result Value Ref Range   WBC 8.7 4.0 - 10.5 K/uL   RBC 4.61 3.87 - 5.11 MIL/uL   Hemoglobin 13.8 12.0 - 15.0 g/dL   HCT 40.1 36.0 - 46.0 %   MCV 87.0 80.0 - 100.0 fL   MCH 29.9 26.0 - 34.0 pg   MCHC 34.4 30.0 - 36.0 g/dL   RDW 14.0 11.5 - 15.5 %   Platelets 271 150 - 400 K/uL   nRBC 0.0 0.0 - 0.2 %  Comment: Performed at [facility], [place] [address] [place], [place] [address]  I-Stat beta hCG blood, ED     Status: None   Collection Time: 01/31/20  7:46 AM  Result Value Ref Range    I-stat hCG, quantitative <5.0 <5 mIU/mL   Comment 3            Comment:   GEST. AGE      CONC.  (mIU/mL)   <=1 WEEK        5 - 50     2 WEEKS       50 - 500     3 WEEKS       100 - 10,000     4 WEEKS     1,000 - 30,000        FEMALE AND NON-PREGNANT FEMALE:     LESS THAN 5 mIU/mL   Lipase, blood     Status: None   Collection Time: 02/02/20 12:17 PM  Result Value Ref Range   Lipase 18 11 - 51 U/L    Comment: Performed at [facility], [place] [address], [place], [place] [address]  Comprehensive metabolic panel     Status: Abnormal   Collection Time: 02/02/20 12:17 PM  Result Value Ref Range   Sodium 133 (L) 135 - 145 mmol/L   Potassium 3.5 3.5 - 5.1 mmol/L   Chloride 99 98 - 111 mmol/L   CO2 23 22 - 32 mmol/L   Glucose, Bld 83 70 - 99 mg/dL    Comment: Glucose reference range applies only to samples taken after fasting for at least 8 hours.   BUN 5 (L) 6 - 20 mg/dL   Creatinine, Ser 0.76 0.44 - 1.00 mg/dL   Calcium 9.1 8.9 - 10.3 mg/dL   Total Protein 7.3 6.5 - 8.1 g/dL   Albumin 3.3 (L) 3.5 - 5.0 g/dL   AST 16 15 - 41 U/L   ALT 15 0 - 44 U/L   Alkaline Phosphatase 52 38 - 126 U/L   Total Bilirubin 1.2 0.3 - 1.2 mg/dL   GFR, Estimated >60 >60 mL/min    Comment: (NOTE) Calculated using the CKD-EPI Creatinine Equation (2021)    Anion gap 11 5 - 15    Comment: Performed at [facility] [address], [place], [place] [address]  CBC     Status: None   Collection Time: 02/02/20 12:17 PM  Result Value Ref Range   WBC 9.3 4.0 - 10.5 K/uL   RBC 5.07 3.87 - 5.11 MIL/uL   Hemoglobin 14.6 12.0 - 15.0 g/dL   HCT 44.1 36.0 - 46.0 %   MCV 87.0 80.0 - 100.0 fL   MCH 28.8 26.0 - 34.0 pg   MCHC 33.1 30.0 - 36.0 g/dL   RDW 13.4 11.5 - 15.5 %   Platelets 294 150 - 400 K/uL   nRBC 0.0 0.0 - 0.2 %    Comment: Performed at [facility], [place] [address] [place], [place] [address]  I-Stat beta hCG blood, ED     Status: None   Collection Time: 02/02/20  7:28 PM  Result  Value Ref Range   I-stat hCG, quantitative <5.0 <5 mIU/mL   Comment 3            Comment:   GEST. AGE      CONC.  (mIU/mL)   <=1 WEEK        5 - 50  2 WEEKS       50 - 500     3 WEEKS       100 - 10,000     4 WEEKS     1,000 - 30,000        FEMALE AND NON-PREGNANT FEMALE:     LESS THAN 5 mIU/mL   GC/Chlamydia probe amp ([place]) not at [facility]     Status: Abnormal   Collection Time: 02/02/20  9:11 PM  Result Value Ref Range   Chlamydia Negative    Neisseria Gonorrhea Positive (A)    Comment Normal Reference Ranger Chlamydia - Negative    Comment      Normal Reference Range Neisseria Gonorrhea - Negative  Wet prep, genital     Status: Abnormal   Collection Time: 02/02/20 10:49 PM   Specimen: PATH Cytology Cervicovaginal Ancillary Only  Result Value Ref Range   Yeast Wet Prep HPF POC NONE SEEN NONE SEEN   Trich, Wet Prep NONE SEEN NONE SEEN   Clue Cells Wet Prep HPF POC PRESENT (A) NONE SEEN   WBC, Wet Prep HPF POC MANY (A) NONE SEEN   Sperm NONE SEEN     Comment: Performed at [facility], [place] [address], [place], [place] [address]  C-reactive protein     Status: None   Collection Time: 02/20/20 12:00 PM  Result Value Ref Range   CRP <1.0 0.5 - 20.0 mg/dL  Basic metabolic panel     Status: None   Collection Time: 02/20/20 12:00 PM  Result Value Ref Range   Sodium 137 135 - 145 mEq/L   Potassium 3.7 3.5 - 5.1 mEq/L   Chloride 106 96 - 112 mEq/L   CO2 28 19 - 32 mEq/L   Glucose, Bld 74 70 - 99 mg/dL   BUN 6 6 - 23 mg/dL   Creatinine, Ser 0.68 0.40 - 1.20 mg/dL   GFR 106.92 >60.00 mL/min    Comment: Calculated using the CKD-EPI Creatinine Equation (2021)   Calcium 9.2 8.4 - 10.5 mg/dL  POC Influenza A & B Ag (Urgent Care)     Status: None   Collection Time: 03/18/20 12:52 PM  Result Value Ref Range   Influenza A Ag NEGATIVE NEGATIVE   Influenza B Ag NEGATIVE NEGATIVE  SARS CORONAVIRUS 2 (TAT 6-24 HRS) Nasopharyngeal Nasopharyngeal Swab     Status: Abnormal    Collection Time: 03/18/20  1:14 PM   Specimen: Nasopharyngeal Swab  Result Value Ref Range   SARS Coronavirus 2 POSITIVE (A) NEGATIVE    Comment: (NOTE) SARS-CoV-2 target nucleic acids are DETECTED.  The SARS-CoV-2 RNA is generally detectable in upper and lower respiratory specimens during the acute phase of infection. Positive results are indicative of the presence of SARS-CoV-2 RNA. Clinical correlation with patient history and other diagnostic information is  necessary to determine patient infection status. Positive results do not rule out bacterial infection or co-infection with other viruses.  The expected result is Negative.  Fact Sheet for Patients: SugarRoll.be  Fact Sheet for Healthcare Providers: https://www.woods-mathews.com/  This test is not yet approved or cleared by the [place] FDA and  has been authorized for detection and/or diagnosis of SARS-CoV-2 by FDA under an Emergency Use Authorization (EUA). This EUA will remain  in effect (meaning this test can be used) for the duration of the COVID-19 declaration under Section 564(b)(1) of the Act, 21 U. S.C. section 360bbb-3(b)(1), unless the authorization is terminated or revoked sooner.  Performed at [facility], [place] [address], [place], [place] [address]   Cervicovaginal ancillary only     Status: None   Collection Time: 03/18/20  1:35 PM  Result Value Ref Range   Neisseria Gonorrhea Negative    Chlamydia Negative    Trichomonas Negative    Bacterial Vaginitis (gardnerella) Negative    Candida Vaginitis Negative    Candida Glabrata Negative    Comment Normal Reference Range Candida Species - Negative    Comment Normal Reference Range Candida Galbrata - Negative    Comment Normal Reference Range Trichomonas - Negative    Comment Normal Reference Ranger Chlamydia - Negative    Comment      Normal Reference Range Neisseria Gonorrhea - Negative   Comment       Normal Reference Range Bacterial Vaginosis - Negative     Assessment: 45 y.o. female with mild/moderate COVID 19 viral infection diagnosed on 2/16 at high risk for progression to severe COVID 19.  Plan:  This patient is a 45 y.o. female that meets the following criteria for Emergency Use Authorization of: Paxlovid 1. Age >12 yr AND > 40 kg 2. SARS-COV-2 positive test 3. Symptom onset < 5 days 4. Mild-to-moderate COVID disease with high risk for severe progression to hospitalization or death  I have spoken and communicated the following to the patient or parent/caregiver regarding: 1. Paxlovid is an unapproved drug that is authorized for use under an Emergency Use Authorization.  2. There are no adequate, approved, available products for the treatment of COVID-19 in adults who have mild-to-moderate COVID-19 and are at high risk for progressing to severe COVID-19, including hospitalization or death. 3. Other therapeutics are currently authorized. For additional information on all products authorized for treatment or prevention of COVID-19, please see TanEmporium.pl.  4. There are benefits and risks of taking this treatment as outlined in the "Fact Sheet for Patients and Caregivers."  5. "Fact Sheet for Patients and Caregivers" was reviewed with patient. A hard copy will be provided to patient from pharmacy prior to the patient receiving treatment. 6. Patients should continue to self-isolate and use infection control measures (e.g., wear mask, isolate, social distance, avoid sharing personal items, clean and disinfect "high touch" surfaces, and frequent handwashing) according to CDC guidelines.  7. The patient or parent/caregiver has the option to accept or refuse treatment. 8. Patient medication history was reviewed for potential drug interactions:No drug interactions 9. Patient's GFR  was calculated to be 106, and they were therefore prescribed Normal dose (GFR>60) - nirmatrelvir 150mg  tab (2 tablet) by mouth twice daily AND ritonavir 100mg  tab (1 tablet) by mouth twice daily   After reviewing above information with the patient, the patient agrees to receive Paxlovid.  Follow up instructions:    . Take prescription BID x 5 days as directed . Reach out to pharmacist for counseling on medication if desired . For concerns regarding further COVID symptoms please follow up with your PCP or urgent care . For urgent or life-threatening issues, seek care at your local emergency department  The patient was provided an opportunity to ask questions, and all were answered. The patient agreed with the plan and demonstrated an understanding of the instructions.   Script sent to [facility] and opted to [employer] via mail order (verified patients address for delivery).  The patient was advised to call their PCP or seek an in-person evaluation if the symptoms worsen or if the condition fails to  improve as anticipated.   I provided 15 minutes of non face-to-face telephone visit time during this encounter, and > 50% was spent counseling as documented under my assessment & plan.  Angelena Form, PA-C 03/20/2020 /10:14 AM

## 2020-03-20 NOTE — Progress Notes (Signed)
Pt told us she had Pro-Air but this was not on her med list and so I was unaware of her being on it. This is not recommended while being on paxlovid and pharmacist counseled her at length about this.   Angelena Form PA-C  [facility]

## 2020-03-23 ENCOUNTER — Encounter (HOSPITAL_COMMUNITY): Payer: Self-pay

## 2020-03-24 ENCOUNTER — Ambulatory Visit (HOSPITAL_COMMUNITY): Payer: Medicaid Other | Admitting: Psychiatry

## 2020-03-30 NOTE — Progress Notes (Signed)
Reviewed and agree with documentation and assessment and plan. K. Veena Makayleigh Poliquin , MD   

## 2020-04-14 ENCOUNTER — Encounter: Payer: Self-pay | Admitting: Pulmonary Disease

## 2020-04-14 ENCOUNTER — Other Ambulatory Visit: Payer: Self-pay

## 2020-04-14 ENCOUNTER — Ambulatory Visit (INDEPENDENT_AMBULATORY_CARE_PROVIDER_SITE_OTHER): Payer: Medicaid Other | Admitting: Pulmonary Disease

## 2020-04-14 VITALS — BP 118/72 | HR 82 | Temp 98.4°F | Ht 62.0 in | Wt 126.4 lb

## 2020-04-14 DIAGNOSIS — R918 Other nonspecific abnormal finding of lung field: Secondary | ICD-10-CM

## 2020-04-14 DIAGNOSIS — J4520 Mild intermittent asthma, uncomplicated: Secondary | ICD-10-CM | POA: Diagnosis not present

## 2020-04-14 MED ORDER — LEVOCETIRIZINE DIHYDROCHLORIDE 5 MG PO TABS: 5.0000 mg | ORAL_TABLET | Freq: Every evening | ORAL | Status: DC

## 2020-04-14 NOTE — Patient Instructions (Signed)
Xyzal 5 mg pill nightly as needed to help with allergies  Will schedule CT chest in June 2022 and arrange for follow up after this

## 2020-04-14 NOTE — Progress Notes (Signed)
[facility]  Chief Complaint  Patient presents with  . Follow-up    6 month for asthma. States she tested positive for covid again in February 2022. Believes the 2nd exposure was work related as well. As a result, she has lost her voice. Scheduled to see an ENT at the end of this month. Was told by the health dept to stop Breo.     Constitutional:  BP 118/72   Pulse 82   Temp 98.4 F (36.9 C) (Temporal)   Ht 5\' 2"  (1.575 m)   Wt 126 lb 6.4 oz (57.3 kg)   SpO2 100% Comment: on RA  BMI 23.12 kg/m   Past Medical History:  Pneumonia, Allergies  Past Surgical History:  She  has a past surgical history that includes Foot surgery; Dilation and evacuation (N/A, 05/22/2012); Cervical cerclage (N/A, 02/26/2015); and Dilation and curettage of uterus.  Brief Summary:  Catherine Osborne is a 45 y.o. female former smoker with asthma and lung nodule.      Subjective:   She had COVID 19 infection in February 2021 and February 2022.  Treated with paxlovid.  Was told at health department that breo causes pneumonia, and so she stopped using.  She is not having chest congestion, wheeze, or sputum.  She has been getting sinus congestion and post nasal drip over the past 1 week.  She works at [employer].  Has to wear gloves at work that have powder in them.  She gets scaling and cracking on her hands from using the gloves.  Improves when she is not at work.  Physical Exam:   Appearance - well kempt   ENMT - no sinus tenderness, no oral exudate, no LAN, Mallampati 3 airway, no stridor, clear nasal drainage  Respiratory - equal breath sounds bilaterally, no wheezing or rales  CV - s1s2 regular rate and rhythm, no murmurs  Ext - no clubbing, no edema  Skin - scaling on hands b/l  Psych - normal mood and affect   Pulmonary testing:   Spirometry 11/04/13 >> FEV1 1.86 (76%), FEV1% 64  Allergy test 11/04/13 >> dust mite, cockroach, dogs, trees  PFT  08/09/19 >> FEV1 2.80 (120%), FEV1% 77, TLC 5.08 (106%), DLCO 106%, +BD  Chest Imaging:   CT chest 10/05/18 >> b/l irregular nodules up to 2.3 cm  CT chest 01/11/19 >> b/l irregular nodules up to 2.1 cm   CT chest 07/22/19 >> stable nodules  Social History:  She  reports that she quit smoking about 2 years ago. Her smoking use included cigarettes. She has a 15.00 pack-year smoking history. She has never used smokeless tobacco. She reports current alcohol use of about 1.0 standard drink of alcohol per week. She reports current drug use. Frequency: 70.00 times per week. Drug: Marijuana.  Family History:  Her family history includes Bone cancer in her mother; Breast cancer in her maternal grandmother; Diabetes in an other family member; Heart attack in an other family member; Hypertension in an other family member; Stroke in an other family member. She was adopted.     Assessment/Plan:   Allergic asthma. - mild, intermittent - discussed how inhaled steroids can cause slight increase risk of pneumonia, and use of ICS needs to be balanced with benefit from asthma control - minimal symptoms at present - monitor clinically  Exercise induced asthma. - discussed techniques to minimize airway irritation prior to engaging in physical activities  Allergic rhinitis. - will have  her try xyzal 5 mg qhs prn  Lung nodules. - she will need f/u CT chest w/o contrast for June 2022  Contact dermatitis of her hands. - likely related to gloves she has to use at work - letter written for her employment  Marijuana dependence. - advised her to quit smoking  Time Spent Involved in Patient Care on Day of Examination:  32 minutes  Follow up:  Patient Instructions  Xyzal 5 mg pill nightly as needed to help with allergies  Will schedule CT chest in June 2022 and arrange for follow up after this   Medication List:   Allergies as of 04/14/2020   No Known Allergies     Medication List        Accurate as of April 14, 2020 10:32 AM. If you have any questions, ask your nurse or doctor.        dicyclomine 10 MG capsule Commonly known as: BENTYL Take 1 capsule (10 mg total) by mouth 2 (two) times daily as needed for spasms (before meals).   ibuprofen 800 MG tablet Commonly known as: ADVIL Take 1 tablet (800 mg total) by mouth every 8 (eight) hours as needed.   levocetirizine 5 MG tablet Commonly known as: Xyzal Take 1 tablet (5 mg total) by mouth every evening. Started by: Chesley Mires, MD       Signature:  Chesley Mires, MD [place] Pager - [phone] 04/14/2020, 10:32 AM

## 2020-04-15 ENCOUNTER — Ambulatory Visit: Payer: Medicaid Other | Admitting: Podiatry

## 2020-04-16 ENCOUNTER — Other Ambulatory Visit: Payer: Self-pay | Admitting: Obstetrics

## 2020-04-16 DIAGNOSIS — Z1231 Encounter for screening mammogram for malignant neoplasm of breast: Secondary | ICD-10-CM

## 2020-04-28 DIAGNOSIS — R490 Dysphonia: Secondary | ICD-10-CM | POA: Insufficient documentation

## 2020-04-28 DIAGNOSIS — J392 Other diseases of pharynx: Secondary | ICD-10-CM | POA: Insufficient documentation

## 2020-05-21 ENCOUNTER — Ambulatory Visit (INDEPENDENT_AMBULATORY_CARE_PROVIDER_SITE_OTHER): Payer: Medicaid Other | Admitting: Psychiatry

## 2020-05-21 ENCOUNTER — Other Ambulatory Visit: Payer: Self-pay

## 2020-05-21 ENCOUNTER — Encounter (HOSPITAL_COMMUNITY): Payer: Self-pay | Admitting: Psychiatry

## 2020-05-21 VITALS — BP 103/66 | HR 86 | Ht 62.0 in | Wt 127.0 lb

## 2020-05-21 DIAGNOSIS — F341 Dysthymic disorder: Secondary | ICD-10-CM | POA: Diagnosis not present
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
## 2020-05-22 ENCOUNTER — Other Ambulatory Visit: Payer: Self-pay

## 2020-05-22 ENCOUNTER — Ambulatory Visit (HOSPITAL_COMMUNITY)
Admission: EM | Admit: 2020-05-22 | Discharge: 2020-05-22 | Disposition: A | Discharge status: Home / Self Care | Payer: Medicaid Other | Attending: Medical Oncology | Admitting: Medical Oncology

## 2020-05-22 ENCOUNTER — Encounter (HOSPITAL_COMMUNITY): Payer: Self-pay

## 2020-05-22 DIAGNOSIS — N898 Other specified noninflammatory disorders of vagina: Secondary | ICD-10-CM | POA: Diagnosis not present

## 2020-05-22 MED ORDER — METRONIDAZOLE 500 MG PO TABS: 500.0000 mg | ORAL_TABLET | Freq: Two times a day (BID) | ORAL | 0 refills | Status: DC

## 2020-05-22 NOTE — ED Triage Notes (Signed)
Pt in with c/o vaginal discharge that she noticed a few day ago  Pt states she has a hx of frequent BV  Denies any vaginal itching

## 2020-05-22 NOTE — ED Provider Notes (Signed)
Catherine Osborne    CSN: [id number] Arrival date & time: 05/22/20  1549      History   Chief Complaint Chief Complaint  Patient presents with  . Vaginal Discharge    HPI ONEISHA Osborne is a 45 y.o. female.   HPI   Vaginal Discharge: Pt reports that a few days ago she noticed vaginal discharge. Vaginal discharge described as similar to past BV infections. She denies any vaginal itching, fever, pelvic pain or vomiting. LMC: within 1 month.   Past Medical History:  Diagnosis Date  . Allergy   . Asthma   . Headache   . 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 Migraines   . Pneumonia   . Prior miscarriage with pregnancy in first trimester, antepartum   . Vaginal Pap smear, abnormal     Patient Active Problem List   Diagnosis Date Noted  . Dysuria 11/28/2019  . Vaginal discharge 11/28/2019  . Marijuana use 08/21/2019  . Anxiety 08/21/2019  . COVID-19 virus detected 03/07/2019  . Personal history of multiple sclerosis ([place]) 08/21/2018  . Persistent depressive disorder with anxious distress, currently moderate 08/21/2018  . Shortness of breath 08/21/2018  . 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 Pulmonary nodules 08/21/2018  . Substance abuse ([place]) 08/21/2018  . Cocaine abuse ([place]) 08/21/2018  . Bronchitis, acute 04/03/2018  . Seasonal allergies 01/18/2016  . Compliance poor 01/18/2016  . Asthma 12/07/2015  . Goiter diffuse 12/07/2015  . NSVD (normal spontaneous vaginal delivery) 06/13/2015  . Pregnancy induced hypertension 06/12/2015  . Preeclampsia 06/10/2015  . Short cervix affecting pregnancy 02/25/2015  . Advanced maternal age in multigravida 02/12/2015  . Left thyroid nodule 10/25/2011    Past Surgical History:  Procedure Laterality Date  . CERVICAL CERCLAGE N/A 02/26/2015   Procedure: CERCLAGE CERVICAL;  Surgeon: Frederico Hamman, MD;  Location: [facility];  Service: Gynecology;  Laterality: N/A;  . DILATION AND CURETTAGE OF UTERUS    . DILATION AND EVACUATION N/A  05/22/2012   Procedure: DILATATION AND EVACUATION;  Surgeon: Frederico Hamman, MD;  Location: [facility];  Service: Gynecology;  Laterality: N/A;  . FOOT SURGERY      OB History    Gravida  14   Para  2   Term  1   Preterm  1   AB  12   Living  1     SAB  10   IAB  2   Ectopic      Multiple  0   Live Births  1            Home Medications    Prior to Admission medications   Medication Sig Start Date End Date Taking? Authorizing Provider  ibuprofen (ADVIL) 800 MG tablet Take 1 tablet (800 mg total) by mouth every 8 (eight) hours as needed. 02/27/20   Shelly Bombard, MD  levocetirizine (XYZAL) 5 MG tablet Take 1 tablet (5 mg total) by mouth every evening. 04/14/20   Chesley Mires, MD  Nirmatrelvir & Ritonavir 20 x 150 MG & 10 x 100MG  TBPK TAKE 3 TABLETS BY MOUTH 2 TIMES DAILY FOR 5 DAYS. 03/20/20 03/20/21  Eileen Stanford, PA-C  nortriptyline (PAMELOR) 10 MG capsule Take 1 capsule (10 mg total) by mouth at bedtime. Patient not taking: No sig reported 04/22/19 07/14/19  Melvenia Beam, MD    Family History Family History  Adopted: Yes  Problem Relation Age of Onset  . Bone cancer Mother   . Breast  cancer Maternal Grandmother   . Diabetes Other   . Hypertension Other   . Stroke Other   . Heart attack Other     Social History Social History   Tobacco Use  . Smoking status: Former Smoker    Packs/day: 0.75    Years: 20.00    Pack years: 15.00    Types: Cigarettes    Quit date: 01/31/2018    Years since quitting: 2.3  . Smokeless tobacco: Never Used  . Tobacco comment: marijuana trying to decrease from 20 blunts day to 5 now  Vaping Use  . Vaping Use: Former  Substance Use Topics  . Alcohol use: Yes    Alcohol/week: 1.0 standard drink    Types: 1 Glasses of wine per week    Comment: rare - no liquor since april 2020/ drinks wine./ update 04/22/19 drinks at special occasions    . Drug use: Yes    Frequency: 70.0 times per week    Types: Marijuana     Comment: 20 blunts a day now to 5 a day - trying to quit./ update 04/22/19- maybe 10 blunts a day     Allergies   Patient has no known allergies.   Review of Systems Review of Systems  As stated above in HPI Physical Exam Triage Vital Signs ED Triage Vitals  Enc Vitals Group     BP 05/22/20 1613 122/77     Pulse Rate 05/22/20 1613 94     Resp 05/22/20 1613 17     Temp 05/22/20 1616 98.5 F (36.9 C)     Temp src --      SpO2 05/22/20 1613 98 %     Weight --      Height --      Head Circumference --      Peak Flow --      Pain Score 05/22/20 1611 0     Pain Loc --      Pain Edu? --      Excl. in [place]? --    No data found.  Updated Vital Signs BP 122/77   Pulse 94   Temp 98.5 F (36.9 C)   Resp 17   LMP 05/14/2020 (Approximate)   SpO2 98%   Physical Exam Vitals and nursing note reviewed.  Constitutional:      General: She is not in acute distress.    Appearance: Normal appearance. She is not ill-appearing, toxic-appearing or diaphoretic.  Cardiovascular:     Rate and Rhythm: Normal rate and regular rhythm.     Heart sounds: Normal heart sounds.  Pulmonary:     Breath sounds: Normal breath sounds.  Abdominal:     General: Bowel sounds are normal. There is no distension.     Palpations: Abdomen is soft. There is no mass.     Tenderness: There is no abdominal tenderness. There is no right CVA tenderness, left CVA tenderness, guarding or rebound.     Hernia: No hernia is present.  Genitourinary:    Comments: Pt performs self swab before visit  Lymphadenopathy:     Cervical: No cervical adenopathy.  Skin:    Findings: No rash.  Neurological:     Mental Status: She is alert.      UC Treatments / Results  Labs (all labs ordered are listed, but only abnormal results are displayed) Labs Reviewed  CERVICOVAGINAL ANCILLARY ONLY    EKG   Radiology No results found.  Procedures Procedures (including critical care time)  Medications Ordered in  UC Medications - No data to display  Initial Impression / Assessment and Plan / UC Course  I have reviewed the triage vital signs and the nursing notes.  Pertinent labs & imaging results that were available during my care of the patient were reviewed by me and considered in my medical decision making (see chart for details).     New. Treating for BV while we await results. Discussed medication use. Final Clinical Impressions(s) / UC Diagnoses   Final diagnoses:  None   Discharge Instructions   None    ED Prescriptions    None     PDMP not reviewed this encounter.   Hughie Closs, [place] 05/22/20 1707

## 2020-05-25 LAB — CERVICOVAGINAL ANCILLARY ONLY
Bacterial Vaginitis (gardnerella): POSITIVE — AB
Candida Glabrata: NEGATIVE
Candida Vaginitis: NEGATIVE
Chlamydia: NEGATIVE
Comment: NEGATIVE
Comment: NEGATIVE
Comment: NEGATIVE
Comment: NEGATIVE
Comment: NEGATIVE
Comment: NORMAL
Neisseria Gonorrhea: NEGATIVE
Trichomonas: NEGATIVE

## 2020-05-26 DIAGNOSIS — J323 Chronic sphenoidal sinusitis: Secondary | ICD-10-CM | POA: Insufficient documentation

## 2020-05-28 ENCOUNTER — Other Ambulatory Visit: Payer: Self-pay | Admitting: Otolaryngology

## 2020-06-04 ENCOUNTER — Telehealth: Payer: Self-pay

## 2020-06-04 NOTE — Telephone Encounter (Signed)
Copied from [place] [id number]. Topic: General - Call Back - No Documentation >> Jun 03, 2020  2:40 PM Erick Blinks wrote: Reason for CRM: Pt called and reported that she wants to come into the office instead of virtual please advise, failed DT   Best contact: [phone]

## 2020-06-04 NOTE — Telephone Encounter (Signed)
Returned pt call. Pt was wanting to know why was her appt virtual. Made pt aware that due to staffing for the evening her appt will need to be virtual. Pt states she wanted to be seen made pt aware that we can reschedule her for in person visit pt asked when that will be was looking through Dr. Wynetta Emery schedule and before I could let pt know pt stated she will need to call back because she is at a doctor appt

## 2020-06-10 ENCOUNTER — Ambulatory Visit: Payer: Medicaid Other

## 2020-06-10 ENCOUNTER — Other Ambulatory Visit: Payer: Self-pay

## 2020-06-11 ENCOUNTER — Ambulatory Visit: Payer: Medicaid Other | Attending: Internal Medicine | Admitting: Internal Medicine

## 2020-06-23 ENCOUNTER — Encounter (HOSPITAL_BASED_OUTPATIENT_CLINIC_OR_DEPARTMENT_OTHER): Payer: Self-pay | Admitting: Otolaryngology

## 2020-06-26 ENCOUNTER — Other Ambulatory Visit: Payer: Self-pay

## 2020-06-26 ENCOUNTER — Encounter (HOSPITAL_BASED_OUTPATIENT_CLINIC_OR_DEPARTMENT_OTHER): Payer: Self-pay | Admitting: Otolaryngology

## 2020-06-30 ENCOUNTER — Other Ambulatory Visit (HOSPITAL_COMMUNITY)
Admission: RE | Admit: 2020-06-30 | Discharge: 2020-06-30 | Disposition: A | Discharge status: Home / Self Care | Payer: Medicaid Other | Source: Ambulatory Visit | Attending: Otolaryngology | Admitting: Otolaryngology

## 2020-06-30 DIAGNOSIS — Z01812 Encounter for preprocedural laboratory examination: Secondary | ICD-10-CM | POA: Diagnosis not present

## 2020-06-30 DIAGNOSIS — Z20822 Contact with and (suspected) exposure to covid-19: Secondary | ICD-10-CM | POA: Diagnosis not present

## 2020-06-30 LAB — SARS CORONAVIRUS 2 (TAT 6-24 HRS): SARS Coronavirus 2: NEGATIVE

## 2020-07-01 ENCOUNTER — Encounter (HOSPITAL_BASED_OUTPATIENT_CLINIC_OR_DEPARTMENT_OTHER)
Admission: RE | Disposition: A | Discharge status: Home / Self Care | Payer: Self-pay | Source: Home / Self Care | Attending: Otolaryngology

## 2020-07-01 ENCOUNTER — Telehealth: Payer: Self-pay | Admitting: Pulmonary Disease

## 2020-07-01 ENCOUNTER — Ambulatory Visit (HOSPITAL_BASED_OUTPATIENT_CLINIC_OR_DEPARTMENT_OTHER): Payer: Medicaid Other | Admitting: Anesthesiology

## 2020-07-01 ENCOUNTER — Encounter (HOSPITAL_BASED_OUTPATIENT_CLINIC_OR_DEPARTMENT_OTHER): Payer: Self-pay | Admitting: Otolaryngology

## 2020-07-01 ENCOUNTER — Ambulatory Visit (HOSPITAL_BASED_OUTPATIENT_CLINIC_OR_DEPARTMENT_OTHER)
Admission: RE | Admit: 2020-07-01 | Discharge: 2020-07-01 | Disposition: A | Discharge status: Home / Self Care | Payer: Medicaid Other | Attending: Otolaryngology | Admitting: Otolaryngology

## 2020-07-01 ENCOUNTER — Other Ambulatory Visit: Payer: Self-pay

## 2020-07-01 DIAGNOSIS — Z803 Family history of malignant neoplasm of breast: Secondary | ICD-10-CM | POA: Insufficient documentation

## 2020-07-01 DIAGNOSIS — J352 Hypertrophy of adenoids: Secondary | ICD-10-CM | POA: Diagnosis not present

## 2020-07-01 DIAGNOSIS — Z8616 Personal history of COVID-19: Secondary | ICD-10-CM | POA: Insufficient documentation

## 2020-07-01 DIAGNOSIS — Z791 Long term (current) use of non-steroidal anti-inflammatories (NSAID): Secondary | ICD-10-CM | POA: Insufficient documentation

## 2020-07-01 DIAGNOSIS — J45909 Unspecified asthma, uncomplicated: Secondary | ICD-10-CM | POA: Insufficient documentation

## 2020-07-01 DIAGNOSIS — Z833 Family history of diabetes mellitus: Secondary | ICD-10-CM | POA: Diagnosis not present

## 2020-07-01 DIAGNOSIS — Z7951 Long term (current) use of inhaled steroids: Secondary | ICD-10-CM | POA: Diagnosis not present

## 2020-07-01 DIAGNOSIS — J341 Cyst and mucocele of nose and nasal sinus: Secondary | ICD-10-CM | POA: Diagnosis not present

## 2020-07-01 DIAGNOSIS — J392 Other diseases of pharynx: Secondary | ICD-10-CM | POA: Diagnosis present

## 2020-07-01 DIAGNOSIS — Z87891 Personal history of nicotine dependence: Secondary | ICD-10-CM | POA: Insufficient documentation

## 2020-07-01 DIAGNOSIS — Z8249 Family history of ischemic heart disease and other diseases of the circulatory system: Secondary | ICD-10-CM | POA: Insufficient documentation

## 2020-07-01 HISTORY — PX: NASOPHARYNGEAL BIOPSY: SHX6488

## 2020-07-01 HISTORY — DX: Nontoxic goiter, unspecified: E04.9

## 2020-07-01 HISTORY — PX: SINUS ENDO W/FUSION: SHX777

## 2020-07-01 LAB — POCT PREGNANCY, URINE: Preg Test, Ur: NEGATIVE

## 2020-07-01 SURGERY — BIOPSY, NASOPHARYNX
Anesthesia: General | Site: Nose | Laterality: Left

## 2020-07-01 MED ORDER — DEXAMETHASONE SODIUM PHOSPHATE 10 MG/ML IJ SOLN
INTRAMUSCULAR | Status: AC
  Filled 2020-07-01: qty 1

## 2020-07-01 MED ORDER — FENTANYL CITRATE (PF) 100 MCG/2ML IJ SOLN
INTRAMUSCULAR | Status: AC
  Filled 2020-07-01: qty 2

## 2020-07-01 MED ORDER — ACETAMINOPHEN 325 MG PO TABS: 325.0000 mg | ORAL_TABLET | ORAL | Status: DC | PRN

## 2020-07-01 MED ORDER — OXYCODONE HCL 5 MG PO TABS: 5.0000 mg | ORAL_TABLET | Freq: Once | ORAL | Status: DC | PRN

## 2020-07-01 MED ORDER — LACTATED RINGERS IV SOLN: INTRAVENOUS | Status: DC

## 2020-07-01 MED ORDER — SCOPOLAMINE 1 MG/3DAYS TD PT72: 1.0000 | MEDICATED_PATCH | TRANSDERMAL | Status: DC

## 2020-07-01 MED ORDER — CEFAZOLIN SODIUM-DEXTROSE 2-4 GM/100ML-% IV SOLN
INTRAVENOUS | Status: AC
  Filled 2020-07-01: qty 100

## 2020-07-01 MED ORDER — SUGAMMADEX SODIUM 200 MG/2ML IV SOLN
INTRAVENOUS | Status: DC | PRN
  Administered 2020-07-01: 200 mg via INTRAVENOUS

## 2020-07-01 MED ORDER — ONDANSETRON HCL 4 MG/2ML IJ SOLN
INTRAMUSCULAR | Status: AC
  Filled 2020-07-01: qty 2

## 2020-07-01 MED ORDER — CEPHALEXIN 500 MG PO CAPS: 500.0000 mg | ORAL_CAPSULE | Freq: Three times a day (TID) | ORAL | 0 refills | Status: AC

## 2020-07-01 MED ORDER — ACETAMINOPHEN 10 MG/ML IV SOLN: 1000.0000 mg | Freq: Once | INTRAVENOUS | Status: DC | PRN

## 2020-07-01 MED ORDER — DEXAMETHASONE SODIUM PHOSPHATE 4 MG/ML IJ SOLN
INTRAMUSCULAR | Status: DC | PRN
  Administered 2020-07-01: 10 mg via INTRAVENOUS

## 2020-07-01 MED ORDER — FENTANYL CITRATE (PF) 100 MCG/2ML IJ SOLN
INTRAMUSCULAR | Status: DC | PRN
  Administered 2020-07-01: 100 ug via INTRAVENOUS
  Administered 2020-07-01: 50 ug via INTRAVENOUS

## 2020-07-01 MED ORDER — MIDAZOLAM HCL 5 MG/5ML IJ SOLN
INTRAMUSCULAR | Status: DC | PRN
  Administered 2020-07-01: 2 mg via INTRAVENOUS

## 2020-07-01 MED ORDER — ONDANSETRON HCL 4 MG/2ML IJ SOLN
INTRAMUSCULAR | Status: DC | PRN
  Administered 2020-07-01: 4 mg via INTRAVENOUS

## 2020-07-01 MED ORDER — ROCURONIUM BROMIDE 100 MG/10ML IV SOLN
INTRAVENOUS | Status: DC | PRN
  Administered 2020-07-01: 50 mg via INTRAVENOUS

## 2020-07-01 MED ORDER — LIDOCAINE-EPINEPHRINE 1 %-1:100000 IJ SOLN
INTRAMUSCULAR | Status: DC | PRN
  Administered 2020-07-01: 3 mL

## 2020-07-01 MED ORDER — MIDAZOLAM HCL 2 MG/2ML IJ SOLN
INTRAMUSCULAR | Status: AC
  Filled 2020-07-01: qty 2

## 2020-07-01 MED ORDER — CEFAZOLIN SODIUM-DEXTROSE 2-4 GM/100ML-% IV SOLN
2.0000 g | INTRAVENOUS | Status: AC
  Administered 2020-07-01: 2 g via INTRAVENOUS

## 2020-07-01 MED ORDER — FENTANYL CITRATE (PF) 100 MCG/2ML IJ SOLN
25.0000 ug | INTRAMUSCULAR | Status: DC | PRN
  Administered 2020-07-01 (×2): 50 ug via INTRAVENOUS

## 2020-07-01 MED ORDER — PROPOFOL 10 MG/ML IV BOLUS
INTRAVENOUS | Status: AC
  Filled 2020-07-01: qty 20

## 2020-07-01 MED ORDER — LIDOCAINE 2% (20 MG/ML) 5 ML SYRINGE
INTRAMUSCULAR | Status: DC | PRN
  Administered 2020-07-01: 40 mg via INTRAVENOUS

## 2020-07-01 MED ORDER — PROPOFOL 10 MG/ML IV BOLUS
INTRAVENOUS | Status: DC | PRN
  Administered 2020-07-01: 140 mg via INTRAVENOUS

## 2020-07-01 MED ORDER — AMISULPRIDE (ANTIEMETIC) 5 MG/2ML IV SOLN: 10.0000 mg | Freq: Once | INTRAVENOUS | Status: DC | PRN

## 2020-07-01 MED ORDER — OXYCODONE HCL 5 MG/5ML PO SOLN
5.0000 mg | Freq: Once | ORAL | Status: DC | PRN
Start: 2020-07-01 — End: 2020-07-01

## 2020-07-01 MED ORDER — OXYMETAZOLINE HCL 0.05 % NA SOLN
NASAL | Status: DC | PRN
  Administered 2020-07-01: 1 via TOPICAL

## 2020-07-01 MED ORDER — ACETAMINOPHEN 160 MG/5ML PO SOLN: 325.0000 mg | ORAL | Status: DC | PRN

## 2020-07-01 MED ORDER — PROMETHAZINE HCL 25 MG/ML IJ SOLN: 6.2500 mg | INTRAMUSCULAR | Status: DC | PRN

## 2020-07-01 SURGICAL SUPPLY — 58 items
BLADE RAD40 ROTATE 4M 4 5PK (BLADE) IMPLANT
BLADE RAD60 ROTATE M4 4 5PK (BLADE) IMPLANT
BLADE ROTATE RAD 12 4 M4 (BLADE) IMPLANT
BLADE ROTATE RAD 40 4 M4 (BLADE) IMPLANT
BLADE ROTATE TRICUT 4X13 M4 (BLADE) ×3 IMPLANT
BLADE TRICUT ROTATE M4 4 5PK (BLADE) IMPLANT
BUR HS RAD FRONTAL 3 (BURR) IMPLANT
BUR TAPER CHOANAL ATRESIA 30K (BURR) IMPLANT
CANISTER SUC SOCK COL 7IN (MISCELLANEOUS) ×6 IMPLANT
CANISTER SUCT 1200ML W/VALVE (MISCELLANEOUS) ×6 IMPLANT
CATH ROBINSON RED A/P 12FR (CATHETERS) ×3 IMPLANT
CATH ROBINSON RED A/P 14FR (CATHETERS) IMPLANT
COAGULATOR SUCT 8FR VV (MISCELLANEOUS) ×3 IMPLANT
COAGULATOR SUCT SWTCH 10FR 6 (ELECTROSURGICAL) ×3 IMPLANT
COVER WAND RF STERILE (DRAPES) IMPLANT
DECANTER SPIKE VIAL GLASS SM (MISCELLANEOUS) ×3 IMPLANT
DRESSING NASAL KENNEDY 3.5X.9 (MISCELLANEOUS) IMPLANT
DRSG NASAL KENNEDY 3.5X.9 (MISCELLANEOUS)
DRSG NASOPORE 8CM (GAUZE/BANDAGES/DRESSINGS) IMPLANT
DRSG TELFA 3X8 NADH (GAUZE/BANDAGES/DRESSINGS) ×3 IMPLANT
ELECT COATED BLADE 2.86 ST (ELECTRODE) IMPLANT
ELECT REM PT RETURN 9FT ADLT (ELECTROSURGICAL) ×3
ELECTRODE REM PT RTRN 9FT ADLT (ELECTROSURGICAL) ×2 IMPLANT
GLOVE SURG ENC TEXT LTX SZ7 (GLOVE) ×6 IMPLANT
GOWN STRL REUS W/ TWL LRG LVL3 (GOWN DISPOSABLE) ×4 IMPLANT
GOWN STRL REUS W/TWL LRG LVL3 (GOWN DISPOSABLE) ×6
IV NS 1000ML (IV SOLUTION)
IV NS 1000ML BAXH (IV SOLUTION) IMPLANT
IV NS 500ML (IV SOLUTION) ×3
IV NS 500ML BAXH (IV SOLUTION) ×2 IMPLANT
IV SET EXT 30 76VOL 4 MALE LL (IV SETS) IMPLANT
NEEDLE PRECISIONGLIDE 27X1.5 (NEEDLE) ×3 IMPLANT
NEEDLE SPNL 25GX3.5 QUINCKE BL (NEEDLE) ×3 IMPLANT
NS IRRIG 1000ML POUR BTL (IV SOLUTION) ×3 IMPLANT
PACK BASIN DAY SURGERY FS (CUSTOM PROCEDURE TRAY) ×3 IMPLANT
PACK ENT DAY SURGERY (CUSTOM PROCEDURE TRAY) ×3 IMPLANT
PENCIL SMOKE EVACUATOR (MISCELLANEOUS) IMPLANT
SHEET MEDIUM DRAPE 40X70 STRL (DRAPES) ×3 IMPLANT
SLEEVE SCD COMPRESS KNEE MED (STOCKING) ×3 IMPLANT
SOLUTION BUTLER CLEAR DIP (MISCELLANEOUS) ×3 IMPLANT
SPLINT NASAL AIRWAY SILICONE (MISCELLANEOUS) IMPLANT
SPONGE GAUZE 2X2 8PLY STRL LF (GAUZE/BANDAGES/DRESSINGS) IMPLANT
SPONGE NEURO XRAY DETECT 1X3 (DISPOSABLE) ×3 IMPLANT
SPONGE TONSIL TAPE 1 RFD (DISPOSABLE) IMPLANT
SPONGE TONSIL TAPE 1.25 RFD (DISPOSABLE) IMPLANT
SUT ETHILON 3 0 PS 1 (SUTURE) IMPLANT
SUT PLAIN 4 0 ~~LOC~~ 1 (SUTURE) IMPLANT
SUT SILK 3 0 PS 1 (SUTURE) IMPLANT
SYR 3ML 23GX1 SAFETY (SYRINGE) IMPLANT
SYR BULB EAR ULCER 3OZ GRN STR (SYRINGE) ×3 IMPLANT
TOWEL GREEN STERILE FF (TOWEL DISPOSABLE) ×6 IMPLANT
TRACKER ENT INSTRUMENT (MISCELLANEOUS) ×3 IMPLANT
TRACKER ENT PATIENT (MISCELLANEOUS) ×3 IMPLANT
TUBE CONNECTING 20X1/4 (TUBING) ×3 IMPLANT
TUBE SALEM SUMP 12R W/ARV (TUBING) IMPLANT
TUBE SALEM SUMP 16 FR W/ARV (TUBING) ×3 IMPLANT
TUBING STRAIGHTSHOT EPS 5PK (TUBING) ×3 IMPLANT
YANKAUER SUCT BULB TIP NO VENT (SUCTIONS) ×3 IMPLANT

## 2020-07-01 NOTE — Discharge Instructions (Signed)

## 2020-07-01 NOTE — Transfer of Care (Signed)
Immediate Anesthesia Transfer of Care Note  Patient: Catherine Osborne  Procedure(s) Performed: Endoscopic Biopsy of Nasophangeal mass (Left ) ENDOSCOPIC SPHENOIDOTOMY WITH TISSUE REMOVAL WITH BILATERAL DEBRIDEMENT AND NAVIGATION (Left )  Patient Location: PACU  Anesthesia Type:General  Level of Consciousness: sedated  Airway & Oxygen Therapy: Patient Spontanous Breathing and Patient connected to face mask oxygen  Post-op Assessment: Report given to RN and Post -op Vital signs reviewed and stable  Post vital signs: Reviewed and stable  Last Vitals:  Vitals Value Taken Time  BP 149/102 07/01/20 1208  Temp    Pulse 59 07/01/20 1211  Resp 8 07/01/20 1211  SpO2 100 % 07/01/20 1211  Vitals shown include unvalidated device data.  Last Pain:  Vitals:   07/01/20 0927  TempSrc: Oral  PainSc: 0-No pain      Patients Stated Pain Goal: 4 (36/[id number]/94 [id number])  Complications: No complications documented.

## 2020-07-01 NOTE — Telephone Encounter (Signed)
Patient called d/t having biopsy of Nasopharyngeal mass tomorrow and was told not to take her Breo by her surgeon. However, she is wheezing now and wants to know if she can take her Breo. I consulted with the pharmacy at [facility]. Neither the pharmacist or I can think of any reason why she can't take her Breo inhaler now. It is not an oral medication. It is inhaled and should not interfere with induction anesthesia in any way. I told her that it was OK to use her Breo inhaler tonight.

## 2020-07-01 NOTE — Anesthesia Preprocedure Evaluation (Signed)
Anesthesia Evaluation  Patient identified by MRN, date of birth, ID band Patient awake    Reviewed: Allergy & Precautions, NPO status , Patient's Chart, lab work & pertinent test results  Airway Mallampati: I  TM Distance: >3 FB Neck ROM: Full    Dental  (+) Teeth Intact, Dental Advisory Given   Pulmonary asthma , Patient abstained from smoking., former smoker,    breath sounds clear to auscultation       Cardiovascular hypertension,  Rhythm:Regular Rate:Normal     Neuro/Psych  Headaches, PSYCHIATRIC DISORDERS Anxiety Depression    GI/Hepatic negative GI ROS, Neg liver ROS,   Endo/Other  negative endocrine ROS  Renal/GU negative Renal ROS     Musculoskeletal negative musculoskeletal ROS (+)   Abdominal Normal abdominal exam  (+)   Peds  Hematology negative hematology ROS (+)   Anesthesia Other Findings   Reproductive/Obstetrics                             Anesthesia Physical Anesthesia Plan  ASA: II  Anesthesia Plan: General   Post-op Pain Management:    Induction: Intravenous  PONV Risk Score and Plan: 4 or greater and Ondansetron, Dexamethasone, Midazolam and Scopolamine patch - Pre-op  Airway Management Planned: Oral ETT  Additional Equipment: None  Intra-op Plan:   Post-operative Plan: Extubation in OR  Informed Consent: I have reviewed the patients History and Physical, chart, labs and discussed the procedure including the risks, benefits and alternatives for the proposed anesthesia with the patient or authorized representative who has indicated his/her understanding and acceptance.     Dental advisory given  Plan Discussed with: CRNA  Anesthesia Plan Comments:         Anesthesia Quick Evaluation

## 2020-07-01 NOTE — Op Note (Signed)
Operative Note:  ENDOSCOPIC SINUS SURGERY WITH NAVIGATION    Endoscopic biopsy nasopharynx    Adenoidectomy    Patient: Catherine Osborne record number: [id number]  Date:07/01/2020  Pre-operative Indications: Left sphenoid sinus opacification     Obstructing nasopharyngeal mass   Postoperative Indications: Same  Surgical Procedure: 1.  Left endoscopic sphenoidotomy with removal of diseased tissue and intraoperative computer-assisted navigation     2.  Endoscopic biopsy nasopharyngeal mass     3.  Adenoidectomy   Anesthesia: GET  Surgeon: Delsa Bern, M.D.  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  Soft tissue mass obstructing the nasopharynx, biopsy sent to pathology.  Adenoidectomy performed.  No nasal packing placed.   Brief History: The patient is a 45 y.o. female with a history of chronic nasal airway obstruction.  She has a history of chronic hoarseness and evaluation of her vocal cords as an office laryngoscopy showed soft tissue mass obstructing the nasopharynx.  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  She also has soft tissue mass obstructing the nasopharynx. Given the patient's history and findings, the above surgical procedures were recommended, risks and benefits were discussed in detail with the patient may understand and agree with our plan for surgery which is scheduled at [facility] under general anesthesia as an outpatient.  Surgical Procedure: The patient is brought to the operating room on 07/01/2020 and placed in supine position on the operating table. General endotracheal anesthesia was established without difficulty. When the patient was adequately anesthetized, surgical timeout was performed with correct identification of the patient and the surgical procedure. The patient's  nose was then injected with 4 cc of 1% lidocaine 1:100,000 dilution epinephrine which was injected in a submucosal fashion along the posterior nasal septum and sphenoid rostrum. The patient's nose was then packed with Afrin-soaked cottonoid pledgets were left in place for approximately 10 minutes to allow for vasoconstriction and hemostasis.  The Xomed Fusion navigation headgear was applied in anatomic and surgical landmarks were identified and confirmed, navigation was used throughout the sinus component of the surgical procedure.  With the patient prepped draped and prepared for surgery, nasal nasal endoscopy was performed on the left side.  Examination showed left nasal septal deviation with soft tissue, mucosal thickening involving the left sphenoid sinus ostium.  Using a 0 degree endoscope and straight suction navigation the posterior aspect of the nasopharynx and sphenoid region were visualized.  The middle turbinate was medialized and the superior turbinate was partially resected using through-cutting forceps.  Using a 0 degree microdebrider residual soft tissue was resected allowing direct access to the natural sphenoid sinus ostium.  This was occluded with soft tissue.  Using the straight microdebrider under direct visualization the ostium was enlarged in the lateral and inferior direction.  A widely patent sphenoid ostium.  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  No evidence of purulent discharge or infection and no significant bleeding.  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  Using a  straight  microdebrider through the nasal passageway this tissue was significantly resected and sent to pathology for gross and microscopic evaluation.  A Crowe-Davis mouthgag was then inserted and the patient nasopharynx was examined under indirect visualization with a mirror.  There was some residual obstructing tissue which was resected with through-cutting forceps completing the patient's adenoidectomy.  Monopolar suction cautery was then used to cauterize the nasopharynx, no significant active bleeding.  The patient's nasal cavity was irrigated and suctioned.  Surgical sponge count was correct. An oral gastric tube was passed and the stomach contents were aspirated. Patient was awakened from anesthetic and transferred from the operating room to the recovery room in stable condition. There were no complications and blood loss was 100 cc.   Delsa Bern, M.D. [facility] 07/01/2020

## 2020-07-01 NOTE — Anesthesia Procedure Notes (Signed)
Procedure Name: Intubation Date/Time: 07/01/2020 10:50 AM Performed by: Maryella Shivers, CRNA Pre-anesthesia Checklist: Patient identified, Emergency Drugs available, Suction available and Patient being monitored Patient Re-evaluated:Patient Re-evaluated prior to induction Oxygen Delivery Method: Circle system utilized Preoxygenation: Pre-oxygenation with 100% oxygen Induction Type: IV induction Ventilation: Mask ventilation without difficulty Laryngoscope Size: Mac and 3 Grade View: Grade I Tube type: Oral Tube size: 7.0 mm Number of attempts: 1 Airway Equipment and Method: Stylet and Oral airway Placement Confirmation: ETT inserted through vocal cords under direct vision,  positive ETCO2 and breath sounds checked- equal and bilateral Secured at: 19 cm Tube secured with: Tape Dental Injury: Teeth and Oropharynx as per pre-operative assessment

## 2020-07-01 NOTE — Progress Notes (Signed)
The pt stated in preop that her teenage son would be caring for her over night at home.  Pt states that teenage son is very responsible and took care of her before when she had covid.  Pt told RN that her aunt and/or neighbor are close by, and could drive her to the hospital if needed overnight.

## 2020-07-01 NOTE — Anesthesia Postprocedure Evaluation (Signed)
Anesthesia Post Note  Patient: ZELLA DEWAN  Procedure(s) Performed: Endoscopic Biopsy of Nasophangeal mass (Left Mouth) ENDOSCOPIC SPHENOIDOTOMY WITH TISSUE REMOVAL WITH BILATERAL DEBRIDEMENT AND NAVIGATION (Bilateral Nose)     Patient location during evaluation: PACU Anesthesia Type: General Level of consciousness: awake and alert Pain management: pain level controlled Vital Signs Assessment: post-procedure vital signs reviewed and stable Respiratory status: spontaneous breathing, nonlabored ventilation, respiratory function stable and patient connected to nasal cannula oxygen Cardiovascular status: blood pressure returned to baseline and stable Postop Assessment: no apparent nausea or vomiting Anesthetic complications: no   No complications documented.  Last Vitals:  Vitals:   07/01/20 1325 07/01/20 1400  BP:  (!) 122/98  Pulse: (!) 59 67  Resp: 10 18  Temp:    SpO2: 98% 95%    Last Pain:  Vitals:   07/01/20 1400  TempSrc:   PainSc: 3                  Effie Berkshire

## 2020-07-01 NOTE — H&P (Signed)
Catherine Osborne is an 45 y.o. female.   Chief Complaint: Chronic sphenoid obstruction, nasopharyngeal swelling HPI: Posterior nasal obstruction and spenoid mucocel  Past Medical History:  Diagnosis Date  . Allergy   . Asthma   . Enlarged thyroid   . Headache   . 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 Migraines   . Personal history of COVID-19 03/18/2020  . Pneumonia   . Prior miscarriage with pregnancy in first trimester, antepartum   . Vaginal Pap smear, abnormal     Past Surgical History:  Procedure Laterality Date  . CERVICAL CERCLAGE N/A 02/26/2015   Procedure: CERCLAGE CERVICAL;  Surgeon: Frederico Hamman, MD;  Location: [facility];  Service: Gynecology;  Laterality: N/A;  . DILATION AND CURETTAGE OF UTERUS    . DILATION AND EVACUATION N/A 05/22/2012   Procedure: DILATATION AND EVACUATION;  Surgeon: Frederico Hamman, MD;  Location: [facility];  Service: Gynecology;  Laterality: N/A;  . FOOT SURGERY      Family History  Adopted: Yes  Problem Relation Age of Onset  . Bone cancer Mother   . Breast cancer Maternal Grandmother   . Diabetes Other   . Hypertension Other   . Stroke Other   . Heart attack Other    Social History:  reports that she quit smoking about 2 years ago. Her smoking use included cigarettes. She has a 15.00 pack-year smoking history. She has never used smokeless tobacco. She reports current alcohol use of about 1.0 standard drink of alcohol per week. She reports current drug use. Frequency: 70.00 times per week. Drug: Marijuana.  Allergies: No Known Allergies  Medications Prior to Admission  Medication Sig Dispense Refill  . fluticasone furoate-vilanterol (BREO ELLIPTA) 100-25 MCG/INH AEPB Inhale 1 puff into the lungs daily.    Marland Kitchen ibuprofen (ADVIL) 800 MG tablet Take 1 tablet (800 mg total) by mouth every 8 (eight) hours as needed. 30 tablet 5  . levocetirizine (XYZAL) 5 MG tablet Take 1 tablet (5 mg total) by mouth every evening.      Results for orders  placed or performed during the hospital encounter of 07/01/20 (from the past 48 hour(s))  Pregnancy, urine POC     Status: None   Collection Time: 07/01/20  9:09 AM  Result Value Ref Range   Preg Test, Ur NEGATIVE NEGATIVE    Comment:        THE SENSITIVITY OF THIS METHODOLOGY IS >24 mIU/mL    No results found.  Review of Systems  Constitutional: Negative.   HENT: Positive for congestion.   Respiratory: Negative.   Cardiovascular: Negative.     Blood pressure 117/70, pulse 72, temperature 98.8 F (37.1 C), temperature source Oral, resp. rate 18, height 5\' 2"  (1.575 m), weight 57.3 kg, last menstrual period 06/08/2020, SpO2 100 %. Physical Exam HENT:     Head:     Comments: Nasopharyngeal swelling Cardiovascular:     Rate and Rhythm: Normal rate.  Pulmonary:     Effort: Pulmonary effort is normal.  Musculoskeletal:     Cervical back: Normal range of motion.      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

## 2020-07-02 ENCOUNTER — Encounter (HOSPITAL_BASED_OUTPATIENT_CLINIC_OR_DEPARTMENT_OTHER): Payer: Self-pay | Admitting: Otolaryngology

## 2020-07-03 ENCOUNTER — Other Ambulatory Visit: Payer: Self-pay

## 2020-07-07 ENCOUNTER — Ambulatory Visit
Admission: RE | Admit: 2020-07-07 | Discharge: 2020-07-07 | Disposition: A | Discharge status: Home / Self Care | Payer: Medicaid Other | Source: Ambulatory Visit | Attending: Pulmonary Disease | Admitting: Pulmonary Disease

## 2020-07-07 ENCOUNTER — Ambulatory Visit (INDEPENDENT_AMBULATORY_CARE_PROVIDER_SITE_OTHER): Payer: Medicaid Other | Admitting: Obstetrics

## 2020-07-07 ENCOUNTER — Other Ambulatory Visit (HOSPITAL_COMMUNITY)
Admission: RE | Admit: 2020-07-07 | Discharge: 2020-07-07 | Disposition: A | Discharge status: Home / Self Care | Payer: Medicaid Other | Source: Ambulatory Visit | Attending: Obstetrics | Admitting: Obstetrics

## 2020-07-07 ENCOUNTER — Other Ambulatory Visit: Payer: Self-pay

## 2020-07-07 ENCOUNTER — Encounter: Payer: Self-pay | Admitting: Obstetrics

## 2020-07-07 VITALS — BP 102/63 | HR 84 | Ht 62.0 in | Wt 126.0 lb

## 2020-07-07 DIAGNOSIS — N898 Other specified noninflammatory disorders of vagina: Secondary | ICD-10-CM | POA: Insufficient documentation

## 2020-07-07 DIAGNOSIS — R300 Dysuria: Secondary | ICD-10-CM | POA: Diagnosis not present

## 2020-07-07 DIAGNOSIS — R918 Other nonspecific abnormal finding of lung field: Secondary | ICD-10-CM

## 2020-07-07 LAB — POCT URINALYSIS DIPSTICK
Bilirubin, UA: NEGATIVE
Blood, UA: NEGATIVE
Glucose, UA: NEGATIVE
Nitrite, UA: NEGATIVE
Protein, UA: NEGATIVE
Spec Grav, UA: 1.025 (ref 1.010–1.025)
Urobilinogen, UA: 0.2 E.U./dL
pH, UA: 5 (ref 5.0–8.0)

## 2020-07-07 MED ORDER — TERCONAZOLE 0.4 % VA CREA: 1.0000 | TOPICAL_CREAM | Freq: Every day | VAGINAL | 0 refills | Status: DC

## 2020-07-07 NOTE — Progress Notes (Signed)
GYN presents for anal and vaginal pain, burning, feels like butt and vagina is on fire, vaginal discharge x 1+ month.  Denies odor, bleeding fever, chills, NV.  Last PAP 08/15/2019

## 2020-07-07 NOTE — Progress Notes (Signed)
[name], female   DOB: 27-Apr-1975, 45 y.o.   MRN: [id number]  Chief Complaint  Patient presents with  . Rectal Pain  . Vaginal Discharge    HPI Catherine Osborne is a 45 y.o. female.  Complains of vaginal discharge with a burning sensation, especially when urine passes over the tissue in the perineum.  "Feels like my vagina is on fire".  She has been taking antibiotics after mouth surgery.  Denies vaginal odor. HPI  Past Medical History:  Diagnosis Date  . Allergy   . Asthma   . Enlarged thyroid   . Headache   . 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 Migraines   . Personal history of COVID-19 03/18/2020  . Pneumonia   . Prior miscarriage with pregnancy in first trimester, antepartum   . Vaginal Pap smear, abnormal     Past Surgical History:  Procedure Laterality Date  . CERVICAL CERCLAGE N/A 02/26/2015   Procedure: CERCLAGE CERVICAL;  Surgeon: Frederico Hamman, MD;  Location: [facility];  Service: Gynecology;  Laterality: N/A;  . DILATION AND CURETTAGE OF UTERUS    . DILATION AND EVACUATION N/A 05/22/2012   Procedure: DILATATION AND EVACUATION;  Surgeon: Frederico Hamman, MD;  Location: [facility];  Service: Gynecology;  Laterality: N/A;  . FOOT SURGERY    . NASOPHARYNGEAL BIOPSY Left 07/01/2020   Procedure: Endoscopic Biopsy of Nasophangeal mass;  Surgeon: Jerrell Belfast, MD;  Location: [facility];  Service: ENT;  Laterality: Left;  . SINUS ENDO W/FUSION Bilateral 07/01/2020   Procedure: ENDOSCOPIC SPHENOIDOTOMY WITH TISSUE REMOVAL WITH BILATERAL DEBRIDEMENT AND NAVIGATION;  Surgeon: Jerrell Belfast, MD;  Location: [facility];  Service: ENT;  Laterality: Bilateral;    Family History  Adopted: Yes  Problem Relation Age of Onset  . Bone cancer Mother   . Breast cancer Maternal Grandmother   . Diabetes Other   . Hypertension Other   . Stroke Other   . Heart attack Other     Social History Social History   Tobacco Use  . Smoking  status: Former Smoker    Packs/day: 0.75    Years: 20.00    Pack years: 15.00    Types: Cigarettes    Quit date: 01/31/2018    Years since quitting: 2.4  . Smokeless tobacco: Never Used  . Tobacco comment: marijuana trying to decrease from 20 blunts day to 5 now  Vaping Use  . Vaping Use: Former  Substance Use Topics  . Alcohol use: Yes    Alcohol/week: 1.0 standard drink    Types: 1 Glasses of wine per week    Comment: rare - no liquor since april 2020/ drinks wine./ update 04/22/19 drinks at special occasions    . Drug use: Yes    Frequency: 70.0 times per week    Types: Marijuana    Comment: 20 blunts a day now to 5 a day - trying to quit./ update 04/22/19- maybe 10 blunts a day, last time pt smoked 07/01/20, midnight    No Known Allergies  Current Outpatient Medications  Medication Sig Dispense Refill  . terconazole (TERAZOL 7) 0.4 % vaginal cream Place 1 applicator vaginally at bedtime. 45 g 0  . cephALEXin (KEFLEX) 500 MG capsule Take 1 capsule (500 mg total) by mouth 3 (three) times daily for 7 days. 21 capsule 0  . fluticasone furoate-vilanterol (BREO ELLIPTA) 100-25 MCG/INH AEPB Inhale 1 puff into the lungs daily.    Marland Kitchen ibuprofen (  ADVIL) 800 MG tablet Take 1 tablet (800 mg total) by mouth every 8 (eight) hours as needed. 30 tablet 5  . levocetirizine (XYZAL) 5 MG tablet Take 1 tablet (5 mg total) by mouth every evening.     No current facility-administered medications for this visit.    Review of Systems Review of Systems Constitutional: negative for fatigue and weight loss Respiratory: negative for cough and wheezing Cardiovascular: negative for chest pain, fatigue and palpitations Gastrointestinal: negative for abdominal pain and change in bowel habits Genitourinary: positive for vaginal discharge and burning sensation when urine passes over the tissue Integument/breast: negative for nipple discharge Musculoskeletal:negative for myalgias Neurological: negative for gait  problems and tremors Behavioral/Psych: negative for abusive relationship, depression Endocrine: negative for temperature intolerance      Blood pressure 102/63, pulse 84, height 5\' 2"  (1.575 m), weight 126 lb (57.2 kg), last menstrual period 07/02/2020.  Physical Exam Physical Exam General:   alert and no distress  Skin:   no rash or abnormalities  Lungs:   clear to auscultation bilaterally  Heart:   regular rate and rhythm, S1, S2 normal, no murmur, click, rub or gallop  Breasts:   not examined  Abdomen:  normal findings: no organomegaly, soft, non-tender and no hernia  Pelvis:  External genitalia: normal general appearance Urinary system: urethral meatus normal and bladder without fullness, nontender Vaginal: normal without tenderness, induration or masses Cervix: normal appearance Adnexa: normal bimanual exam Uterus: anteverted and non-tender, normal size    I have spent a total of 15 minutes of face-to-face time, excluding clinical staff time, reviewing notes and preparing to see patient, ordering tests and/or medications, and counseling the patient.  Data Reviewed Wet Prep Urinalysis  Assessment     1. Vaginal discharge Rx: - Cervicovaginal ancillary only( [place])  2. Vaginal irritation - she is taking an antibiotic mouth surgery - probable yeast infection.  Will treat for yeast presumptively  Rx; - terconazole (TERAZOL 7) 0.4 % vaginal cream; Place 1 applicator vaginally at bedtime.  Dispense: 45 g; Refill: 0  3. Dysuria Rx: - POCT Urinalysis Dipstick - Urine Culture    Plan    Orders Placed This Encounter  Procedures  . Urine Culture  . POCT Urinalysis Dipstick   Meds ordered this encounter  Medications  . terconazole (TERAZOL 7) 0.4 % vaginal cream    Sig: Place 1 applicator vaginally at bedtime.    Dispense:  45 g    Refill:  0     Shelly Bombard, MD 07/07/2020 4:36 PM

## 2020-07-08 ENCOUNTER — Other Ambulatory Visit: Payer: Self-pay

## 2020-07-08 MED ORDER — FLUCONAZOLE 150 MG PO TABS: 150.0000 mg | ORAL_TABLET | Freq: Once | ORAL | 0 refills | Status: AC

## 2020-07-08 NOTE — Progress Notes (Signed)
Called and scheduled office visit with Dr Halford Chessman at the [facility] office on Tuesday 07/28/20 at 11am to go over CT results per Dr Halford Chessman. Patient agreeable with time, date and location. Nothing further needed at this time.

## 2020-07-09 LAB — CERVICOVAGINAL ANCILLARY ONLY
Bacterial Vaginitis (gardnerella): POSITIVE — AB
Candida Glabrata: NEGATIVE
Candida Vaginitis: NEGATIVE
Chlamydia: NEGATIVE
Comment: NEGATIVE
Comment: NEGATIVE
Comment: NEGATIVE
Comment: NEGATIVE
Comment: NEGATIVE
Comment: NORMAL
Neisseria Gonorrhea: NEGATIVE
Trichomonas: NEGATIVE

## 2020-07-10 LAB — URINE CULTURE: Organism ID, Bacteria: NO GROWTH

## 2020-07-12 ENCOUNTER — Other Ambulatory Visit: Payer: Self-pay | Admitting: Obstetrics

## 2020-07-12 DIAGNOSIS — N760 Acute vaginitis: Secondary | ICD-10-CM

## 2020-07-12 MED ORDER — METRONIDAZOLE 500 MG PO TABS: 500.0000 mg | ORAL_TABLET | Freq: Two times a day (BID) | ORAL | 2 refills | Status: DC

## 2020-07-13 ENCOUNTER — Telehealth: Payer: Self-pay | Admitting: Obstetrics

## 2020-07-13 ENCOUNTER — Telehealth: Payer: Self-pay

## 2020-07-13 NOTE — Telephone Encounter (Signed)
Call patient to inform her of test results. No answer or voice mail.

## 2020-07-13 NOTE — Telephone Encounter (Signed)
Patient called, returning nurse call. Notified patient of her BV result and prescription has been sent to the pharmacy.  Patient is very upset, she states she has been taking Flagyl for years now and is very frustrated that she continues to have BV.  She would like to speak with Dr. Jodi Mourning and try alternate treatment.    Patient also states she would need to go back on her depression medication because she cannot tolerate this ongoing BV.   Routing to provider for follow up.

## 2020-07-13 NOTE — Telephone Encounter (Signed)
-----   Message from Shelly Bombard, MD sent at 07/12/2020  8:22 AM EDT ----- Flagyl Rx for BV

## 2020-07-18 ENCOUNTER — Ambulatory Visit
Admission: RE | Admit: 2020-07-18 | Discharge: 2020-07-18 | Disposition: A | Discharge status: Home / Self Care | Payer: Medicaid Other | Source: Ambulatory Visit | Attending: Obstetrics | Admitting: Obstetrics

## 2020-07-18 DIAGNOSIS — Z1231 Encounter for screening mammogram for malignant neoplasm of breast: Secondary | ICD-10-CM

## 2020-07-21 ENCOUNTER — Ambulatory Visit (HOSPITAL_COMMUNITY): Payer: Medicaid Other | Admitting: Psychiatry

## 2020-07-23 ENCOUNTER — Encounter: Payer: Self-pay | Admitting: Obstetrics

## 2020-07-23 ENCOUNTER — Ambulatory Visit: Payer: Medicaid Other | Admitting: Obstetrics

## 2020-07-23 ENCOUNTER — Other Ambulatory Visit (HOSPITAL_COMMUNITY)
Admission: RE | Admit: 2020-07-23 | Discharge: 2020-07-23 | Disposition: A | Discharge status: Home / Self Care | Payer: Medicaid Other | Source: Ambulatory Visit | Attending: Obstetrics and Gynecology | Admitting: Obstetrics and Gynecology

## 2020-07-23 ENCOUNTER — Other Ambulatory Visit: Payer: Self-pay

## 2020-07-23 VITALS — BP 102/63 | HR 77 | Wt 130.0 lb

## 2020-07-23 DIAGNOSIS — N898 Other specified noninflammatory disorders of vagina: Secondary | ICD-10-CM | POA: Diagnosis not present

## 2020-07-23 NOTE — Progress Notes (Signed)
Pt is in the office for recurrent BV, completed metronidazole a few days ago, reporting vaginal irritation and burning.

## 2020-07-24 NOTE — Progress Notes (Signed)
[name], female   DOB: 01/23/1976, 45 y.o.   MRN: [id number]  Chief Complaint  Patient presents with   GYN    HPI Catherine Osborne is a 45 y.o. female.  Complains of vaginal discharge with irritation.  Denies vaginal odor. HPI  Past Medical History:  Diagnosis Date   Allergy    Asthma    Enlarged thyroid    Headache    Lactose intolerance in adult    Migraines    Personal history of COVID-19 03/18/2020   Pneumonia    Prior miscarriage with pregnancy in first trimester, antepartum    Vaginal Pap smear, abnormal     Past Surgical History:  Procedure Laterality Date   CERVICAL CERCLAGE N/A 02/26/2015   Procedure: CERCLAGE CERVICAL;  Surgeon: Frederico Hamman, MD;  Location: [facility];  Service: Gynecology;  Laterality: N/A;   DILATION AND CURETTAGE OF UTERUS     DILATION AND EVACUATION N/A 05/22/2012   Procedure: DILATATION AND EVACUATION;  Surgeon: Frederico Hamman, MD;  Location: [facility];  Service: Gynecology;  Laterality: N/A;   FOOT SURGERY     NASOPHARYNGEAL BIOPSY Left 07/01/2020   Procedure: Endoscopic Biopsy of Nasophangeal mass;  Surgeon: Jerrell Belfast, MD;  Location: [facility];  Service: ENT;  Laterality: Left;   SINUS ENDO W/FUSION Bilateral 07/01/2020   Procedure: ENDOSCOPIC SPHENOIDOTOMY WITH TISSUE REMOVAL WITH BILATERAL DEBRIDEMENT AND NAVIGATION;  Surgeon: Jerrell Belfast, MD;  Location: [facility];  Service: ENT;  Laterality: Bilateral;    Family History  Adopted: Yes  Problem Relation Age of Onset   Bone cancer Mother    Breast cancer Maternal Grandmother    Diabetes Other    Hypertension Other    Stroke Other    Heart attack Other     Social History Social History   Tobacco Use   Smoking status: Former    Packs/day: 0.75    Years: 20.00    Pack years: 15.00    Types: Cigarettes    Quit date: 01/31/2018    Years since quitting: 2.4   Smokeless tobacco: Never   Tobacco comments:    marijuana  trying to decrease from 20 blunts day to 5 now  Vaping Use   Vaping Use: Former  Substance Use Topics   Alcohol use: Yes    Alcohol/week: 1.0 standard drink    Types: 1 Glasses of wine per week    Comment: rare - no liquor since april 2020/ drinks wine./ update 04/22/19 drinks at special occasions     Drug use: Yes    Frequency: 70.0 times per week    Types: Marijuana    Comment: 20 blunts a day now to 5 a day - trying to quit./ update 04/22/19- maybe 10 blunts a day, last time pt smoked 07/01/20, midnight    No Known Allergies  Current Outpatient Medications  Medication Sig Dispense Refill   fluticasone furoate-vilanterol (BREO ELLIPTA) 100-25 MCG/INH AEPB Inhale 1 puff into the lungs daily. (Patient not taking: Reported on 07/23/2020)     ibuprofen (ADVIL) 800 MG tablet Take 1 tablet (800 mg total) by mouth every 8 (eight) hours as needed. (Patient not taking: Reported on 07/23/2020) 30 tablet 5   levocetirizine (XYZAL) 5 MG tablet Take 1 tablet (5 mg total) by mouth every evening. (Patient not taking: Reported on 07/23/2020)     metroNIDAZOLE (FLAGYL) 500 MG tablet Take 1 tablet (500 mg total) by mouth 2 (two) times  daily. (Patient not taking: Reported on 07/23/2020) 14 tablet 2   terconazole (TERAZOL 7) 0.4 % vaginal cream Place 1 applicator vaginally at bedtime. (Patient not taking: Reported on 07/23/2020) 45 g 0   No current facility-administered medications for this visit.    Review of Systems Review of Systems Constitutional: negative for fatigue and weight loss Respiratory: negative for cough and wheezing Cardiovascular: negative for chest pain, fatigue and palpitations Gastrointestinal: negative for abdominal pain and change in bowel habits Genitourinary: positive for vaginal discharge and burning type irritation when urine passes over the tissue Integument/breast: negative for nipple discharge Musculoskeletal:negative for myalgias Neurological: negative for gait problems and  tremors Behavioral/Psych: negative for abusive relationship, depression Endocrine: negative for temperature intolerance      Blood pressure 102/63, pulse 77, weight 130 lb (59 kg), last menstrual period 07/02/2020.  Physical Exam Physical Exam General:   Alert and no distress  Skin:   no rash or abnormalities  Lungs:   clear to auscultation bilaterally  Heart:   regular rate and rhythm, S1, S2 normal, no murmur, click, rub or gallop  Breasts:   Not examined  Abdomen:  normal findings: no organomegaly, soft, non-tender and no hernia  Pelvis:  External genitalia: normal general appearance Urinary system: urethral meatus normal and bladder without fullness, nontender Vaginal: normal without tenderness, induration or masses Cervix: normal appearance Adnexa: normal bimanual exam Uterus: anteverted and non-tender, normal size    I have spent a total of 15 minutes of face-to-face time, excluding clinical staff time, reviewing notes and preparing to see patient, ordering tests and/or medications, and counseling the patient.   Data Reviewed Wet Prep and Cultures  Assessment     1. Vaginal discharge Rx: - Cervicovaginal ancillary only( [place])  2. Vaginal irritation - probable BV.  Will treat with positive cultures     Plan   Follow up prn   Shelly Bombard, MD 07-23-2020

## 2020-07-27 LAB — CERVICOVAGINAL ANCILLARY ONLY
Bacterial Vaginitis (gardnerella): NEGATIVE
Candida Glabrata: NEGATIVE
Candida Vaginitis: NEGATIVE
Chlamydia: NEGATIVE
Comment: NEGATIVE
Comment: NEGATIVE
Comment: NEGATIVE
Comment: NEGATIVE
Comment: NEGATIVE
Comment: NORMAL
Neisseria Gonorrhea: NEGATIVE
Trichomonas: NEGATIVE

## 2020-07-28 ENCOUNTER — Other Ambulatory Visit: Payer: Self-pay

## 2020-07-28 ENCOUNTER — Encounter: Payer: Self-pay | Admitting: Pulmonary Disease

## 2020-07-28 ENCOUNTER — Ambulatory Visit: Payer: Medicaid Other | Admitting: Pulmonary Disease

## 2020-07-28 VITALS — BP 108/60 | HR 79 | Temp 98.1°F | Ht 62.0 in | Wt 129.0 lb

## 2020-07-28 DIAGNOSIS — F1290 Cannabis use, unspecified, uncomplicated: Secondary | ICD-10-CM

## 2020-07-28 DIAGNOSIS — R918 Other nonspecific abnormal finding of lung field: Secondary | ICD-10-CM | POA: Diagnosis not present

## 2020-07-28 DIAGNOSIS — J4520 Mild intermittent asthma, uncomplicated: Secondary | ICD-10-CM | POA: Diagnosis not present

## 2020-07-28 LAB — SURGICAL PATHOLOGY

## 2020-07-28 NOTE — Progress Notes (Signed)
[facility]  Chief Complaint  Patient presents with   Follow-up    Pt stated here to discuss CT results.     Constitutional:  BP 108/60   Pulse 79   Temp 98.1 F (36.7 C) (Oral)   Ht 5\' 2"  (1.575 m)   Wt 129 lb (58.5 kg)   LMP 07/02/2020 (Exact Date)   SpO2 98%   BMI 23.59 kg/m   Past Medical History:  Pneumonia, Allergies  Past Surgical History:  She  has a past surgical history that includes Foot surgery; Dilation and evacuation (N/A, 05/22/2012); Cervical cerclage (N/A, 02/26/2015); Dilation and curettage of uterus; Nasopharyngeal biopsy (Left, 07/01/2020); and Sinus endo w/fusion (Bilateral, 07/01/2020).  Brief Summary:  Catherine Osborne is a 45 y.o. female former smoker with asthma and lung nodule.      Subjective:   CT chest showed stable nodules, and very mild changes of emphysema.  She was out in heat and exposed to dust yesterday.  Had more cough and wheeze.  Used her breo and this helped.  Hadn't used for months prior to this.  Feels better today.  Down to 5 blunts per day (was smoking up to 50 per day).  She had sphenoidotomy with cyst removal, adenoidectomy and removal of nasopharyngeal cyst with Dr. Wilburn Cornelia on 07/01/20.  Physical Exam:   Appearance - well kempt   ENMT - no sinus tenderness, no oral exudate, no LAN, Mallampati 3 airway, no stridor  Respiratory - equal breath sounds bilaterally, no wheezing or rales  CV - s1s2 regular rate and rhythm, no murmurs  Ext - no clubbing, no edema  Skin - no rashes  Psych - normal mood and affect   Pulmonary testing:  Spirometry 11/04/13 >> FEV1 1.86 (76%), FEV1% 64 Allergy test 11/04/13 >> dust mite, cockroach, dogs, trees PFT 08/09/19 >> FEV1 2.80 (120%), FEV1% 77, TLC 5.08 (106%), DLCO 106%, +BD  Chest Imaging:  CT chest 10/05/18 >> b/l irregular nodules up to 2.3 cm CT chest 01/11/19 >> b/l irregular nodules up to 2.1 cm  CT chest 07/22/19 >> stable  nodules CT chest 07/07/20 >> mild centrilobular and paraseptal emphysema, no change in nodules  Social History:  She  reports that she quit smoking about 2 years ago. Her smoking use included cigarettes. She has a 15.00 pack-year smoking history. She has never used smokeless tobacco. She reports current alcohol use of about 1.0 standard drink of alcohol per week. She reports current drug use. Frequency: 70.00 times per week. Drug: Marijuana.  Family History:  Her family history includes Bone cancer in her mother; Breast cancer in her maternal grandmother; Diabetes in an other family member; Heart attack in an other family member; Hypertension in an other family member; Stroke in an other family member. She was adopted.     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 - discussed techniques to limit environmental exposure - she uses breo intermittently   Exercise induced asthma. - discussed techniques to minimize airway irritation prior to engaging in physical activities   Allergic rhinitis. - resume antihistamines and sinus regimen when okay with ENT   Lung nodules. - stable from 2020 through 2022 - likely benign lesions - no additional radiographic follow up needed   Marijuana dependence. - encouraged her to continue cessation efforts  Time Spent Involved in Patient Care on Day of Examination:  22 minutes  Follow up:   Patient Instructions  Follow up in 6 months  Medication List:   Allergies as of 07/28/2020   No Known Allergies      Medication List        Accurate as of July 28, 2020 11:18 AM. If you have any questions, ask your nurse or doctor.          STOP taking these medications    levocetirizine 5 MG tablet Commonly known as: Xyzal Stopped by: Chesley Mires, MD   metroNIDAZOLE 500 MG tablet Commonly known as: FLAGYL Stopped by: Chesley Mires, MD   terconazole 0.4 % vaginal cream Commonly known as: TERAZOL 7 Stopped  by: Chesley Mires, MD       TAKE these medications    Breo Ellipta 100-25 MCG/INH Aepb Generic drug: fluticasone furoate-vilanterol Inhale 1 puff into the lungs daily.   ibuprofen 800 MG tablet Commonly known as: ADVIL Take 1 tablet (800 mg total) by mouth every 8 (eight) hours as needed.        Signature:  Chesley Mires, MD [place] Pager - [phone] 07/28/2020, 11:18 AM

## 2020-07-28 NOTE — Patient Instructions (Signed)
Follow up in 6 months 

## 2020-07-31 ENCOUNTER — Encounter: Payer: Self-pay | Admitting: Podiatry

## 2020-07-31 ENCOUNTER — Other Ambulatory Visit: Payer: Self-pay

## 2020-07-31 ENCOUNTER — Ambulatory Visit: Payer: Medicaid Other | Admitting: Podiatry

## 2020-07-31 DIAGNOSIS — M79672 Pain in left foot: Secondary | ICD-10-CM | POA: Diagnosis not present

## 2020-07-31 DIAGNOSIS — R52 Pain, unspecified: Secondary | ICD-10-CM | POA: Diagnosis not present

## 2020-07-31 DIAGNOSIS — L905 Scar conditions and fibrosis of skin: Secondary | ICD-10-CM | POA: Diagnosis not present

## 2020-08-03 NOTE — Progress Notes (Signed)
Subjective: Catherine Osborne is a pleasant 45 y.o. female patient seen today for management of painful postsurgical scar on plantar aspect of left foot. Pain interferes with ambulation. Aggravating factors include wearing enclosed shoe gear. Pain is relieved with periodic professional debridement.  She states she is working now. She is able to stand in her work shoes for her shift. She voices no new pedal problems on today's visit.  She did have consultation with Dr. Sherryle Lis who no surgical intervention would improve her condition.  Her son is present during today's visit.  No Known Allergies  Objective: Physical Exam  General: Catherine Osborne is a pleasant 45 y.o. African American female, WD, WN in NAD. AAO x 3.   Vascular:  Capillary refill time to digits immediate b/l. Palpable pedal pulses b/l LE. Pedal hair present. Lower extremity skin temperature gradient within normal limits. No edema noted b/l lower extremities.  Dermatological:  Pedal skin with normal turgor, texture and tone b/l lower extremities No open wounds b/l lower extremities No interdigital macerations b/l lower extremities Toenails 1-5 bilaterally well maintained with adequate length. No erythema, no edema, no drainage, no fluctuance. Hypertrophic scar noted plantar aspect left foot with tenderness to palpation. Centralized porokeratosis noted in scar. No erythema, no edema, no drainage, no fluctuance..  Musculoskeletal:  Normal muscle strength 5/5 to all lower extremity muscle groups bilaterally. No pain crepitus or joint limitation noted with ROM b/l. No gross bony deformities bilaterally.  Neurological:  Protective sensation intact 5/5 intact bilaterally with 10g monofilament b/l. Vibratory sensation intact b/l. Proprioception intact bilaterally.  Assessment and Plan:  1. Painful cutaneous scar   2. Pain in left foot     -Examined patient. -As a courtesy, pared painful benign cutaneous scar to patient's  tolerance. We discussed she is to continue pumice stone and emoillient to manage between visits. Dispensed non-medicated padding to offload lesion. -Patient to continue soft, supportive shoe gear daily. -Patient to report any pedal injuries to medical professional immediately. -Patient/POA to call should there be question/concern in the interim.  Return in about 3 months (around 10/31/2020).  Marzetta Board, DPM

## 2020-08-21 ENCOUNTER — Other Ambulatory Visit: Payer: Self-pay

## 2020-08-21 ENCOUNTER — Ambulatory Visit (INDEPENDENT_AMBULATORY_CARE_PROVIDER_SITE_OTHER): Payer: Medicaid Other

## 2020-08-21 VITALS — BP 109/66 | HR 87 | Ht 62.0 in | Wt 130.0 lb

## 2020-08-21 DIAGNOSIS — Z3202 Encounter for pregnancy test, result negative: Secondary | ICD-10-CM

## 2020-08-21 LAB — POCT URINE PREGNANCY: Preg Test, Ur: NEGATIVE

## 2020-08-21 NOTE — Progress Notes (Signed)
Ms. Pinson presents today for UPT. She has no unusual complaints.  LMP: 07/03/2020    OBJECTIVE: Appears well, in no apparent distress.  OB History     Gravida  14   Para  2   Term  1   Preterm  1   AB  12   Living  1      SAB  10   IAB  2   Ectopic      Multiple  0   Live Births  1          Home UPT Result:NEGATIVE In-Office UPT result:NEGATIVE  I have reviewed the patient's medical, obstetrical, social, and family histories, and medications.   ASSESSMENT: NEGATIVE pregnancy test

## 2020-08-21 NOTE — Progress Notes (Signed)
Agree with A & P. 

## 2020-08-25 ENCOUNTER — Other Ambulatory Visit: Payer: Self-pay | Admitting: Pulmonary Disease

## 2020-09-04 ENCOUNTER — Telehealth: Payer: Self-pay | Admitting: Pulmonary Disease

## 2020-09-04 DIAGNOSIS — R0602 Shortness of breath: Secondary | ICD-10-CM

## 2020-09-04 MED ORDER — ALBUTEROL SULFATE HFA 108 (90 BASE) MCG/ACT IN AERS: 2.0000 | INHALATION_SPRAY | Freq: Four times a day (QID) | RESPIRATORY_TRACT | 2 refills | Status: AC | PRN

## 2020-09-04 MED ORDER — ALBUTEROL SULFATE (2.5 MG/3ML) 0.083% IN NEBU: 2.5000 mg | INHALATION_SOLUTION | Freq: Four times a day (QID) | RESPIRATORY_TRACT | 2 refills | Status: AC | PRN

## 2020-09-04 NOTE — Telephone Encounter (Signed)
Pt also wanting [place] to know that she has been using 2 vials when she uses the nebulizer

## 2020-09-04 NOTE — Telephone Encounter (Signed)
Called spoke with patient  Let her know Dr. Juanetta Gosling recommendations Patient voiced understanding.  Nothing further needed

## 2020-09-04 NOTE — Telephone Encounter (Signed)
Continue Breo daily.  Can send script for albuterol two puffs q6h prn.  Can also send script for albuterol 2.5 mg to be used in nebulizer q6h prn.  She should dispose of her old nebulizer medications and not use these.

## 2020-09-04 NOTE — Telephone Encounter (Signed)
Called spoke with patient. She states she always gets this way in the summer due to the heat and humidity. She states its mostly at night. She is waking up in the middle of the night or around 4am not being able to breathe. Patient is on BREO but not taking correctly. We discussed its an every day medication not an emergency medication. She verbalized she will start taking 1 puff each day instead of 2-3 puffs for emergencies. She also states she has a nebulizer machine with medication but the medication was given to her 1-2 years ago and can't read what the name is on the medication but is taking 2 vials each treatment and 2-3 treatments a week.   Sending to Dr. Halford Chessman for further recommendations.

## 2020-09-04 NOTE — Telephone Encounter (Signed)
Pt states over the last 2 weeks started becoming sob. Pt states the [employer] is not working, having to use nebulizer. Pt unsure if her nebulizer medication is "good" or not, but it seems to help her breathing and helped her sleep. Pt also has cough(deep sounding)Pt has increased work hours, works at [employer], and it is hot/humid/dry where she works. Please advise [phone]

## 2020-09-22 ENCOUNTER — Telehealth: Payer: Self-pay | Admitting: Pulmonary Disease

## 2020-09-22 ENCOUNTER — Ambulatory Visit: Payer: Medicaid Other | Admitting: Family Medicine

## 2020-09-22 ENCOUNTER — Encounter: Payer: Self-pay | Admitting: Family Medicine

## 2020-09-22 ENCOUNTER — Other Ambulatory Visit: Payer: Self-pay

## 2020-09-22 ENCOUNTER — Other Ambulatory Visit (HOSPITAL_COMMUNITY)
Admission: RE | Admit: 2020-09-22 | Discharge: 2020-09-22 | Disposition: A | Discharge status: Home / Self Care | Payer: Medicaid Other | Source: Ambulatory Visit | Attending: Family Medicine | Admitting: Family Medicine

## 2020-09-22 VITALS — BP 118/80 | HR 85 | Wt 126.2 lb

## 2020-09-22 DIAGNOSIS — N898 Other specified noninflammatory disorders of vagina: Secondary | ICD-10-CM | POA: Diagnosis not present

## 2020-09-22 DIAGNOSIS — Z3202 Encounter for pregnancy test, result negative: Secondary | ICD-10-CM

## 2020-09-22 LAB — POCT URINE PREGNANCY: Preg Test, Ur: NEGATIVE

## 2020-09-22 NOTE — Telephone Encounter (Signed)
Spoke with the pt  She has been out of Breo 4 days and states this is causing her to have some increased SOB  She states not having any fever, aches, purulent sputum or other acute symptoms  I called pharm and they stated she just needed new rx and I gave verbal order for this  Pt aware and nothing further needed
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
## 2020-09-24 ENCOUNTER — Other Ambulatory Visit: Payer: Self-pay | Admitting: Family Medicine

## 2020-09-24 DIAGNOSIS — B9689 Other specified bacterial agents as the cause of diseases classified elsewhere: Secondary | ICD-10-CM

## 2020-09-24 LAB — CERVICOVAGINAL ANCILLARY ONLY
Bacterial Vaginitis (gardnerella): POSITIVE — AB
Candida Glabrata: NEGATIVE
Candida Vaginitis: NEGATIVE
Chlamydia: NEGATIVE
Comment: NEGATIVE
Comment: NEGATIVE
Comment: NEGATIVE
Comment: NEGATIVE
Comment: NEGATIVE
Comment: NORMAL
Neisseria Gonorrhea: NEGATIVE
Trichomonas: NEGATIVE

## 2020-09-24 MED ORDER — METRONIDAZOLE 500 MG PO TABS: 500.0000 mg | ORAL_TABLET | Freq: Two times a day (BID) | ORAL | 0 refills | Status: AC

## 2020-10-01 ENCOUNTER — Telehealth: Payer: Self-pay | Admitting: Pulmonary Disease

## 2020-10-01 NOTE — Telephone Encounter (Signed)
Left message for patient to call back  

## 2020-10-01 NOTE — Telephone Encounter (Signed)
Pt sent a MyChart message stating;  "Get my breathing under control"  Pt is requesting an appointment with Dr. Halford Chessman ASAP; pt was last seen 07/28/2020 and Dr. Halford Chessman wants to see the pt back in clinic in Dec. 2022.  Pls regard; [phone]

## 2020-10-19 ENCOUNTER — Emergency Department (HOSPITAL_COMMUNITY)
Admission: EM | Admit: 2020-10-19 | Discharge: 2020-10-19 | Discharge status: Against Medical Advice | Payer: Medicaid Other

## 2020-10-19 NOTE — ED Notes (Signed)
Patient decided to leave.   

## 2020-10-22 ENCOUNTER — Emergency Department (HOSPITAL_COMMUNITY)
Admission: EM | Admit: 2020-10-22 | Discharge: 2020-10-31 | Disposition: E | Payer: Medicaid Other | Attending: Emergency Medicine | Admitting: Emergency Medicine

## 2020-10-22 DIAGNOSIS — T07XXXA Unspecified multiple injuries, initial encounter: Secondary | ICD-10-CM

## 2020-10-22 DIAGNOSIS — S31139A Puncture wound of abdominal wall without foreign body, unspecified quadrant without penetration into peritoneal cavity, initial encounter: Secondary | ICD-10-CM

## 2020-10-22 DIAGNOSIS — S1190XA Unspecified open wound of unspecified part of neck, initial encounter: Secondary | ICD-10-CM | POA: Diagnosis not present

## 2020-10-22 DIAGNOSIS — S3991XA Unspecified injury of abdomen, initial encounter: Secondary | ICD-10-CM | POA: Diagnosis present

## 2020-10-22 DIAGNOSIS — W3400XA Accidental discharge from unspecified firearms or gun, initial encounter: Secondary | ICD-10-CM | POA: Diagnosis not present

## 2020-10-22 DIAGNOSIS — S01401A Unspecified open wound of right cheek and temporomandibular area, initial encounter: Secondary | ICD-10-CM | POA: Diagnosis not present

## 2020-10-22 DIAGNOSIS — S31109A Unspecified open wound of abdominal wall, unspecified quadrant without penetration into peritoneal cavity, initial encounter: Secondary | ICD-10-CM | POA: Insufficient documentation

## 2020-10-22 DIAGNOSIS — S21301A Unspecified open wound of right front wall of thorax with penetration into thoracic cavity, initial encounter: Secondary | ICD-10-CM | POA: Insufficient documentation

## 2020-10-22 DIAGNOSIS — S31000A Unspecified open wound of lower back and pelvis without penetration into retroperitoneum, initial encounter: Secondary | ICD-10-CM | POA: Diagnosis not present

## 2020-10-22 DIAGNOSIS — S41102A Unspecified open wound of left upper arm, initial encounter: Secondary | ICD-10-CM | POA: Diagnosis not present

## 2020-10-22 DIAGNOSIS — S21302A Unspecified open wound of left front wall of thorax with penetration into thoracic cavity, initial encounter: Secondary | ICD-10-CM | POA: Diagnosis not present

## 2020-10-22 LAB — BPAM FFP
Blood Product Expiration Date: 202209242359
Blood Product Expiration Date: 202209252359
ISSUE DATE / TIME: 202209221602
ISSUE DATE / TIME: 202209221602
Unit Type and Rh: 600
Unit Type and Rh: 6200

## 2020-10-22 LAB — PREPARE FRESH FROZEN PLASMA
Unit division: 0
Unit division: 0

## 2020-10-22 MED ORDER — EPINEPHRINE 1 MG/10ML IJ SOSY
PREFILLED_SYRINGE | INTRAMUSCULAR | Status: DC | PRN
Start: 2020-10-22 — End: 2020-10-22
  Administered 2020-10-22 (×2): 1 mg via INTRAVENOUS

## 2020-10-23 LAB — BPAM RBC
Blood Product Expiration Date: 202209262359
Blood Product Expiration Date: 202209272359
ISSUE DATE / TIME: 202209222316
ISSUE DATE / TIME: 202209222316
Unit Type and Rh: 9500
Unit Type and Rh: 9500

## 2020-10-23 LAB — TYPE AND SCREEN
Unit division: 0
Unit division: 0

## 2020-10-27 NOTE — Telephone Encounter (Signed)
Pt is deceased. Will close encounter.  °

## 2020-10-29 ENCOUNTER — Ambulatory Visit: Payer: Medicaid Other | Admitting: Family Medicine
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
## 2020-10-31 NOTE — ED Notes (Signed)
Pt's belongings, socks, pair black tennis, underwear and bra to [facility] officer [place] badge [phone].
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
## 2020-10-31 NOTE — ED Notes (Signed)
Per [employer], pt's grandmother Donnald Garre [phone], and brother, Vessie Olmsted [phone].

## 2020-10-31 DEATH — deceased

## 2020-11-06 ENCOUNTER — Ambulatory Visit: Payer: Medicaid Other | Admitting: Podiatry

## 2023-01-19 LAB — MOLECULAR PATHOLOGY
